# Patient Record
Sex: Female | Born: 1960
Health system: Southern US, Community
[De-identification: ages and names within clinical notes are randomized; demographics above are authoritative.]

## PROBLEM LIST (undated history)

## (undated) DIAGNOSIS — C801 Malignant (primary) neoplasm, unspecified: Secondary | ICD-10-CM

## (undated) DIAGNOSIS — F32A Depression, unspecified: Secondary | ICD-10-CM

## (undated) DIAGNOSIS — K589 Irritable bowel syndrome without diarrhea: Secondary | ICD-10-CM

## (undated) DIAGNOSIS — F419 Anxiety disorder, unspecified: Secondary | ICD-10-CM

## (undated) DIAGNOSIS — T7840XA Allergy, unspecified, initial encounter: Secondary | ICD-10-CM

## (undated) DIAGNOSIS — M1990 Unspecified osteoarthritis, unspecified site: Secondary | ICD-10-CM

## (undated) DIAGNOSIS — M6530 Trigger finger, unspecified finger: Secondary | ICD-10-CM

## (undated) HISTORY — PX: CHOLECYSTECTOMY: SHX55

## (undated) HISTORY — DX: Trigger finger, unspecified finger: M65.30

## (undated) HISTORY — DX: Depression, unspecified: F32.A

## (undated) HISTORY — DX: Malignant (primary) neoplasm, unspecified: C80.1

## (undated) HISTORY — DX: Allergy, unspecified, initial encounter: T78.40XA

## (undated) HISTORY — PX: TYMPANOPLASTY: SHX33

## (undated) HISTORY — DX: Anxiety disorder, unspecified: F41.9

## (undated) HISTORY — DX: Irritable bowel syndrome, unspecified: K58.9

## (undated) HISTORY — PX: ABDOMINAL HYSTERECTOMY: SHX81

---

## 1996-03-28 HISTORY — PX: BREAST ENHANCEMENT SURGERY: SHX7

## 2008-03-28 HISTORY — PX: BREAST CYST ASPIRATION: SHX578

## 2008-03-28 HISTORY — PX: AUGMENTATION MAMMAPLASTY: SUR837

## 2016-02-26 HISTORY — PX: ABDOMINAL HYSTERECTOMY: SUR658

## 2017-02-21 ENCOUNTER — Ambulatory Visit (INDEPENDENT_AMBULATORY_CARE_PROVIDER_SITE_OTHER): Payer: BLUE CROSS/BLUE SHIELD | Admitting: Internal Medicine

## 2017-02-21 ENCOUNTER — Encounter: Payer: Self-pay | Admitting: Internal Medicine

## 2017-02-21 VITALS — BP 124/70 | HR 70 | Temp 98.0°F | Ht 63.5 in | Wt 129.0 lb

## 2017-02-21 DIAGNOSIS — K582 Mixed irritable bowel syndrome: Secondary | ICD-10-CM | POA: Insufficient documentation

## 2017-02-21 DIAGNOSIS — B349 Viral infection, unspecified: Secondary | ICD-10-CM

## 2017-02-21 DIAGNOSIS — M65332 Trigger finger, left middle finger: Secondary | ICD-10-CM | POA: Diagnosis not present

## 2017-02-21 DIAGNOSIS — Z1231 Encounter for screening mammogram for malignant neoplasm of breast: Secondary | ICD-10-CM

## 2017-02-21 DIAGNOSIS — Z1239 Encounter for other screening for malignant neoplasm of breast: Secondary | ICD-10-CM

## 2017-02-21 NOTE — Progress Notes (Signed)
Date:  02/21/2017   Name:  Jennifer Garrison   DOB:  [birth year]   MRN:  [id number]   Chief Complaint: Establish Care and Fatigue (Past two days. Went to [employer] week. Sunday night started feeling foggy and tired. Very cold with chills throughout the last few days. Yesterday laid around and did nothing. )  Illness  The current episode started yesterday. The problem occurs constantly. The problem is unchanged. The pain is moderate. The symptoms are aggravated by activity. Associated symptoms include fatigue. Pertinent negatives include no congestion, headaches, hearing loss, fever (but chills), chest pain, shortness of breath, wheezing, constipation, diarrhea, nausea, joint swelling or rash.    Review of Systems  Constitutional: Positive for chills and fatigue. Negative for fever (but chills).  HENT: Negative for congestion, facial swelling, hearing loss, postnasal drip, sinus pressure, sinus pain, trouble swallowing and voice change.   Respiratory: Negative for shortness of breath and wheezing.   Cardiovascular: Negative for chest pain, palpitations and leg swelling.  Gastrointestinal: Negative for abdominal distention, constipation, diarrhea and nausea.  Genitourinary: Negative for dysuria.  Musculoskeletal: Negative for arthralgias and joint swelling.       Triggering of left middle finger  Skin: Negative for rash.  Allergic/Immunologic: Negative for environmental allergies.  Neurological: Negative for dizziness, weakness and headaches.  Hematological: Negative for adenopathy.  Psychiatric/Behavioral: Negative for sleep disturbance.    There are no active problems to display for this patient.   Prior to Admission medications   Not on File    No Known Allergies  Past Surgical History:  Procedure Laterality Date  . ABDOMINAL HYSTERECTOMY  02/2016   Partial- Still has cervix.   Marland Kitchen BREAST ENHANCEMENT SURGERY  1998  . CHOLECYSTECTOMY    . TYMPANOPLASTY Left      Social History   Tobacco Use  . Smoking status: Former Research scientist (life sciences)  . Smokeless tobacco: Never Used  Substance Use Topics  . Alcohol use: Yes    Comment: socially   . Drug use: No     Medication list has been reviewed and updated.  No flowsheet data found.  Physical Exam  Constitutional: She is oriented to person, place, and time. She appears well-developed. No distress.  HENT:  Head: Normocephalic and atraumatic.  Right Ear: Ear canal normal. Tympanic membrane is scarred.  Mouth/Throat: No posterior oropharyngeal edema or posterior oropharyngeal erythema.  Left canal obscured by cerumen  Cardiovascular: Normal rate, regular rhythm and normal heart sounds.  Pulmonary/Chest: Effort normal and breath sounds normal. No respiratory distress. She has no wheezes. She has no rhonchi.  Abdominal: Soft. Normal appearance and bowel sounds are normal. There is no tenderness.  Musculoskeletal: Normal range of motion.  Left middle finger triggering  Neurological: She is alert and oriented to person, place, and time.  Skin: Skin is warm and dry. No rash noted.  Psychiatric: She has a normal mood and affect. Her behavior is normal. Thought content normal.  Nursing note and vitals reviewed.   BP 124/70   Pulse 70   Temp 98 F (36.7 C) (Oral)   Ht 5' 3.5" (1.613 m)   Wt 129 lb (58.5 kg)   SpO2 99%   BMI 22.49 kg/m   Assessment and Plan: 1. Irritable bowel syndrome with both constipation and diarrhea On bupropion with good control  2. Viral disease Recommend rest, fluids Follow up if sx worsen  3. Breast cancer screening Due for annual mammogram - will need to get records from [place] -  MM DIGITAL SCREENING BILATERAL; Future  4. Trigger middle finger of left hand Avoid stress Consider Ortho referral   No orders of the defined types were placed in this encounter.   Partially dictated using Editor, commissioning. Any errors are unintentional.  Halina Maidens, MD [facility]  02/21/2017

## 2017-04-13 ENCOUNTER — Telehealth: Payer: Self-pay

## 2017-04-13 NOTE — Telephone Encounter (Signed)
Had Rx from Bupropion with 7 refills is now not able to be filled since old doctor out of state license was cancelled. Per Dr. Army Melia.Marland KitchenMarland KitchenMarland KitchenMust be seen in OV.Scgeduled for tomorrow.

## 2017-04-14 ENCOUNTER — Ambulatory Visit (INDEPENDENT_AMBULATORY_CARE_PROVIDER_SITE_OTHER): Payer: BLUE CROSS/BLUE SHIELD | Admitting: Internal Medicine

## 2017-04-14 ENCOUNTER — Encounter: Payer: Self-pay | Admitting: Internal Medicine

## 2017-04-14 VITALS — BP 118/60 | HR 73 | Ht 63.5 in | Wt 133.0 lb

## 2017-04-14 DIAGNOSIS — F39 Unspecified mood [affective] disorder: Secondary | ICD-10-CM

## 2017-04-14 MED ORDER — BUPROPION HCL ER (SR) 150 MG PO TB12: 150.0000 mg | ORAL_TABLET | Freq: Two times a day (BID) | ORAL | 5 refills | Status: DC

## 2017-04-14 NOTE — Progress Notes (Signed)
Date:  04/14/2017   Name:  Jennifer Garrison   DOB:  [birth year]   MRN:  [id number]   Chief Complaint: Depression (Refill on buproprion. PHQ9- 4. )  Depression         This is a chronic problem.  The problem occurs rarely.The problem is unchanged.  Associated symptoms include irritable and decreased interest.  Associated symptoms include no fatigue and no headaches.  Treatments tried: bupropion.  Compliance with treatment is good.  Previous treatment provided significant relief.    Review of Systems  Constitutional: Negative for chills, fatigue and fever.  Respiratory: Negative for chest tightness, shortness of breath and wheezing.   Cardiovascular: Negative for chest pain and palpitations.  Neurological: Negative for dizziness and headaches.  Psychiatric/Behavioral: Positive for depression.    Patient Active Problem List   Diagnosis Date Noted  . Irritable bowel syndrome with both constipation and diarrhea 02/21/2017  . Trigger middle finger of left hand 02/21/2017    Prior to Admission medications   Medication Sig Start Date End Date Taking? Authorizing Provider  buPROPion (WELLBUTRIN SR) 150 MG 12 hr tablet Take 150 mg by mouth 2 (two) times daily.   Yes [provider]    No Known Allergies  Past Surgical History:  Procedure Laterality Date  . ABDOMINAL HYSTERECTOMY  02/2016   Only cervix remains (done for prolapse)  . BREAST BIOPSY Left 2010   benign  . BREAST ENHANCEMENT SURGERY  1998  . CHOLECYSTECTOMY    . TYMPANOPLASTY Left     Social History   Tobacco Use  . Smoking status: Former Smoker    Types: Cigarettes    Last attempt to quit: 09/09/1992    Years since quitting: 24.6  . Smokeless tobacco: Never Used  Substance Use Topics  . Alcohol use: Yes    Comment: socially   . Drug use: No     Medication list has been reviewed and updated.  PHQ 2/9 Scores 04/14/2017 04/14/2017  PHQ - 2 Score 2 0  PHQ- 9 Score 4 -    Physical Exam    Constitutional: She is oriented to person, place, and time. She appears well-developed. She is irritable. No distress.  HENT:  Head: Normocephalic and atraumatic.  Neck: Normal range of motion. Neck supple.  Cardiovascular: Normal rate, regular rhythm and normal heart sounds.  Pulmonary/Chest: Effort normal and breath sounds normal. No respiratory distress. She has no wheezes.  Musculoskeletal: She exhibits no edema.  Neurological: She is alert and oriented to person, place, and time.  Skin: Skin is warm and dry. No rash noted.  Psychiatric: She has a normal mood and affect. Her behavior is normal. Thought content normal.  Nursing note and vitals reviewed.   BP 118/60   Pulse 73   Ht 5' 3.5" (1.613 m)   Wt 133 lb (60.3 kg)   SpO2 100%   BMI 23.19 kg/m   Assessment and Plan: 1. Mood disorder (HCC) Continue current medications - buPROPion (WELLBUTRIN SR) 150 MG 12 hr tablet; Take 1 tablet (150 mg total) by mouth 2 (two) times daily.  Dispense: 60 tablet; Refill: 5   Meds ordered this encounter  Medications  . buPROPion (WELLBUTRIN SR) 150 MG 12 hr tablet    Sig: Take 1 tablet (150 mg total) by mouth 2 (two) times daily.    Dispense:  60 tablet    Refill:  5    Partially dictated using Editor, commissioning. Any errors are unintentional.  Halina Maidens,  MD [facility]  04/14/2017

## 2017-04-24 ENCOUNTER — Other Ambulatory Visit: Payer: Self-pay | Admitting: Internal Medicine

## 2017-04-24 ENCOUNTER — Ambulatory Visit
Admission: RE | Admit: 2017-04-24 | Discharge: 2017-04-24 | Disposition: A | Discharge status: Home / Self Care | Payer: BLUE CROSS/BLUE SHIELD | Source: Ambulatory Visit | Attending: Internal Medicine | Admitting: Internal Medicine

## 2017-04-24 DIAGNOSIS — Z1239 Encounter for other screening for malignant neoplasm of breast: Secondary | ICD-10-CM

## 2017-04-24 DIAGNOSIS — Z1231 Encounter for screening mammogram for malignant neoplasm of breast: Secondary | ICD-10-CM | POA: Insufficient documentation

## 2017-05-01 ENCOUNTER — Ambulatory Visit (INDEPENDENT_AMBULATORY_CARE_PROVIDER_SITE_OTHER): Payer: BLUE CROSS/BLUE SHIELD | Admitting: Internal Medicine

## 2017-05-01 ENCOUNTER — Other Ambulatory Visit: Payer: Self-pay | Admitting: *Deleted

## 2017-05-01 ENCOUNTER — Encounter: Payer: Self-pay | Admitting: Internal Medicine

## 2017-05-01 ENCOUNTER — Inpatient Hospital Stay
Admission: RE | Admit: 2017-05-01 | Discharge: 2017-05-01 | Disposition: A | Discharge status: Home / Self Care | Payer: Self-pay | Source: Ambulatory Visit | Attending: *Deleted | Admitting: *Deleted

## 2017-05-01 VITALS — BP 110/64 | HR 64 | Ht 63.5 in | Wt 134.0 lb

## 2017-05-01 DIAGNOSIS — F32A Depression, unspecified: Secondary | ICD-10-CM | POA: Insufficient documentation

## 2017-05-01 DIAGNOSIS — Z9289 Personal history of other medical treatment: Secondary | ICD-10-CM

## 2017-05-01 DIAGNOSIS — Z1211 Encounter for screening for malignant neoplasm of colon: Secondary | ICD-10-CM

## 2017-05-01 DIAGNOSIS — Z1159 Encounter for screening for other viral diseases: Secondary | ICD-10-CM

## 2017-05-01 DIAGNOSIS — Z0000 Encounter for general adult medical examination without abnormal findings: Secondary | ICD-10-CM

## 2017-05-01 DIAGNOSIS — F324 Major depressive disorder, single episode, in partial remission: Secondary | ICD-10-CM

## 2017-05-01 DIAGNOSIS — K582 Mixed irritable bowel syndrome: Secondary | ICD-10-CM

## 2017-05-01 DIAGNOSIS — F419 Anxiety disorder, unspecified: Secondary | ICD-10-CM | POA: Insufficient documentation

## 2017-05-01 NOTE — Progress Notes (Signed)
Date:  05/01/2017   Name:  Jennifer Garrison   DOB:  28-Aug-1960   MRN:  [id number]   Chief Complaint: Annual Exam (Breast Exam. ) Jennifer Garrison is a 57 y.o. female who presents today for her Complete Annual Exam. She feels well. She reports exercising some. She reports she is sleeping well. She had a mammogram last week - results pending. She has had a colonoscopy - many years ago - reported normal.  Not sure if it has been 10 yrs. Last Pap in 04/2016 after partial hysterectomy.   Review of Systems  Constitutional: Negative for chills, fatigue and fever.  HENT: Negative for congestion, hearing loss, tinnitus, trouble swallowing and voice change.   Eyes: Negative for visual disturbance.  Respiratory: Negative for cough, chest tightness, shortness of breath and wheezing.   Cardiovascular: Negative for chest pain, palpitations and leg swelling.  Gastrointestinal: Negative for abdominal pain, constipation, diarrhea and vomiting.  Endocrine: Negative for polydipsia and polyuria.  Genitourinary: Negative for dysuria, frequency, genital sores, vaginal bleeding and vaginal discharge.  Musculoskeletal: Negative for arthralgias, gait problem and joint swelling.  Skin: Negative for color change and rash.  Neurological: Negative for dizziness, tremors, light-headedness and headaches.  Hematological: Negative for adenopathy. Does not bruise/bleed easily.  Psychiatric/Behavioral: Negative for dysphoric mood and sleep disturbance. The patient is not nervous/anxious.     Patient Active Problem List   Diagnosis Date Noted  . Irritable bowel syndrome with both constipation and diarrhea 02/21/2017  . Trigger middle finger of left hand 02/21/2017    Prior to Admission medications   Medication Sig Start Date End Date Taking? Authorizing Provider  buPROPion (WELLBUTRIN SR) 150 MG 12 hr tablet Take 1 tablet (150 mg total) by mouth 2 (two) times daily. 04/14/17  Yes Glean Hess, MD    No Known  Allergies  Past Surgical History:  Procedure Laterality Date  . ABDOMINAL HYSTERECTOMY  02/2016   Only cervix remains (done for prolapse)  . ABDOMINAL HYSTERECTOMY    . AUGMENTATION MAMMAPLASTY Bilateral 2010  . BREAST CYST ASPIRATION Left 2010   neg  . BREAST ENHANCEMENT SURGERY  1998  . CHOLECYSTECTOMY    . TYMPANOPLASTY Left     Social History   Tobacco Use  . Smoking status: Former Smoker    Types: Cigarettes    Last attempt to quit: 09/09/1992    Years since quitting: 24.6  . Smokeless tobacco: Never Used  Substance Use Topics  . Alcohol use: Yes    Comment: socially   . Drug use: No     Medication list has been reviewed and updated.  PHQ 2/9 Scores 04/14/2017 04/14/2017  PHQ - 2 Score 2 0  PHQ- 9 Score 4 -    Physical Exam  Constitutional: She is oriented to person, place, and time. She appears well-developed and well-nourished. No distress.  HENT:  Head: Normocephalic and atraumatic.  Right Ear: Tympanic membrane and ear canal normal.  Left Ear: Tympanic membrane and ear canal normal.  Nose: Right sinus exhibits no maxillary sinus tenderness. Left sinus exhibits no maxillary sinus tenderness.  Mouth/Throat: Uvula is midline and oropharynx is clear and moist.  Eyes: Conjunctivae and EOM are normal. Right eye exhibits no discharge. Left eye exhibits no discharge. No scleral icterus.  Neck: Normal range of motion. Carotid bruit is not present. No erythema present. No thyromegaly present.  Cardiovascular: Normal rate, regular rhythm, normal heart sounds and normal pulses.  Pulmonary/Chest: Effort normal. No respiratory distress. She  has no wheezes. Right breast exhibits no mass, no nipple discharge, no skin change and no tenderness. Left breast exhibits no mass, no nipple discharge, no skin change and no tenderness.  Bilateral implants soft, shifted sideways.  Abdominal: Soft. Bowel sounds are normal. There is no hepatosplenomegaly. There is no tenderness. There is  no CVA tenderness.  Musculoskeletal: Normal range of motion.  Lymphadenopathy:    She has no cervical adenopathy.    She has no axillary adenopathy.  Neurological: She is alert and oriented to person, place, and time. She has normal reflexes. No cranial nerve deficit or sensory deficit.  Skin: Skin is warm, dry and intact. No rash noted.  Psychiatric: She has a normal mood and affect. Her speech is normal and behavior is normal. Thought content normal.  Nursing note and vitals reviewed.   BP 110/64   Pulse 64   Ht 5' 3.5" (1.613 m)   Wt 134 lb (60.8 kg)   SpO2 99%   BMI 23.36 kg/m   Assessment and Plan: 1. Annual physical exam Normal exam  Waiting for mammogram results - Comprehensive metabolic panel - Lipid panel - POCT urinalysis dipstick  2. Major depressive disorder with single episode, in partial remission ([name]) Doing well on medication - TSH  3. Irritable bowel syndrome with both constipation and diarrhea Stable since cholecystectomy - CBC with Differential/Platelet  4. Need for hepatitis C screening test - Hepatitis C antibody  5. Colon cancer screening - Ambulatory referral to Gastroenterology   No orders of the defined types were placed in this encounter.   Partially dictated using Editor, commissioning. Any errors are unintentional.  Halina Maidens, MD [facility]  05/01/2017

## 2017-05-02 LAB — COMPREHENSIVE METABOLIC PANEL
A/G RATIO: 2 (ref 1.2–2.2)
ALBUMIN: 4.9 g/dL (ref 3.5–5.5)
ALK PHOS: 84 IU/L (ref 39–117)
ALT: 15 IU/L (ref 0–32)
AST: 17 IU/L (ref 0–40)
BILIRUBIN TOTAL: 0.6 mg/dL (ref 0.0–1.2)
BUN / CREAT RATIO: 16 (ref 9–23)
BUN: 13 mg/dL (ref 6–24)
CHLORIDE: 101 mmol/L (ref 96–106)
CO2: 26 mmol/L (ref 20–29)
Calcium: 9.5 mg/dL (ref 8.7–10.2)
Creatinine, Ser: 0.8 mg/dL (ref 0.57–1.00)
GFR calc non Af Amer: 82 mL/min/{1.73_m2} (ref >59)
GFR, EST AFRICAN AMERICAN: 95 mL/min/{1.73_m2} (ref >59)
GLOBULIN, TOTAL: 2.5 g/dL (ref 1.5–4.5)
GLUCOSE: 80 mg/dL (ref 65–99)
Potassium: 4.3 mmol/L (ref 3.5–5.2)
SODIUM: 143 mmol/L (ref 134–144)
TOTAL PROTEIN: 7.4 g/dL (ref 6.0–8.5)

## 2017-05-02 LAB — CBC WITH DIFFERENTIAL/PLATELET
BASOS ABS: 0 10*3/uL (ref 0.0–0.2)
BASOS: 1 %
EOS (ABSOLUTE): 0.1 10*3/uL (ref 0.0–0.4)
Eos: 2 %
HEMATOCRIT: 42.7 % (ref 34.0–46.6)
HEMOGLOBIN: 14.4 g/dL (ref 11.1–15.9)
IMMATURE GRANS (ABS): 0 10*3/uL (ref 0.0–0.1)
Immature Granulocytes: 0 %
LYMPHS: 25 %
Lymphocytes Absolute: 2 10*3/uL (ref 0.7–3.1)
MCH: 29 pg (ref 26.6–33.0)
MCHC: 33.7 g/dL (ref 31.5–35.7)
MCV: 86 fL (ref 79–97)
MONOCYTES: 5 %
Monocytes Absolute: 0.4 10*3/uL (ref 0.1–0.9)
NEUTROS ABS: 5.3 10*3/uL (ref 1.4–7.0)
Neutrophils: 67 %
Platelets: 244 10*3/uL (ref 150–379)
RBC: 4.96 x10E6/uL (ref 3.77–5.28)
RDW: 13.5 % (ref 12.3–15.4)
WBC: 7.8 10*3/uL (ref 3.4–10.8)

## 2017-05-02 LAB — LIPID PANEL
CHOLESTEROL TOTAL: 187 mg/dL (ref 100–199)
Chol/HDL Ratio: 3.5 ratio (ref 0.0–4.4)
HDL: 53 mg/dL (ref >39)
LDL Calculated: 112 mg/dL — ABNORMAL HIGH (ref 0–99)
Triglycerides: 112 mg/dL (ref 0–149)
VLDL CHOLESTEROL CAL: 22 mg/dL (ref 5–40)

## 2017-05-02 LAB — TSH: TSH: 1.99 u[IU]/mL (ref 0.450–4.500)

## 2017-05-02 LAB — HEPATITIS C ANTIBODY

## 2017-05-03 ENCOUNTER — Telehealth: Payer: Self-pay | Admitting: Gastroenterology

## 2017-05-03 ENCOUNTER — Other Ambulatory Visit: Payer: Self-pay

## 2017-05-03 DIAGNOSIS — Z1211 Encounter for screening for malignant neoplasm of colon: Secondary | ICD-10-CM

## 2017-05-03 NOTE — Telephone Encounter (Signed)
Patient LVM that she would like to schedule a colonoscopy

## 2017-05-05 ENCOUNTER — Other Ambulatory Visit: Payer: Self-pay

## 2017-05-17 ENCOUNTER — Ambulatory Visit: Admit: 2017-05-17 | Payer: BLUE CROSS/BLUE SHIELD | Admitting: Gastroenterology

## 2017-05-17 SURGERY — COLONOSCOPY WITH PROPOFOL
Anesthesia: General

## 2017-07-26 ENCOUNTER — Ambulatory Visit: Payer: BLUE CROSS/BLUE SHIELD | Admitting: Internal Medicine

## 2017-10-30 ENCOUNTER — Telehealth: Payer: Self-pay

## 2017-10-30 NOTE — Telephone Encounter (Signed)
Patient called stating she has had dizziness for 3 days now. Some nausea this morning. No SOB or chest pain. She stated she feels dizziness when standing up too quickly or waking up in the morning and getting out of bed.  Informed her Dr Army Melia is out of town this week for vacation so she wouldn't be able to be seen until Monday. Informed her not to wait until Monday and to go see UC to be evaluated. She verbalized understanding of this.

## 2017-10-31 ENCOUNTER — Encounter: Payer: Self-pay | Admitting: Emergency Medicine

## 2017-10-31 ENCOUNTER — Other Ambulatory Visit: Payer: Self-pay

## 2017-10-31 ENCOUNTER — Ambulatory Visit
Admission: EM | Admit: 2017-10-31 | Discharge: 2017-10-31 | Disposition: A | Discharge status: Home / Self Care | Payer: BLUE CROSS/BLUE SHIELD | Attending: Family Medicine | Admitting: Family Medicine

## 2017-10-31 DIAGNOSIS — R42 Dizziness and giddiness: Secondary | ICD-10-CM

## 2017-10-31 DIAGNOSIS — E860 Dehydration: Secondary | ICD-10-CM

## 2017-10-31 NOTE — ED Provider Notes (Signed)
[facility]    CSN: [id number] Arrival date & time: 10/31/17  1316     History   Chief Complaint Chief Complaint  Patient presents with  . Dizziness    HPI Ellieana Dolecki is a 57 y.o. female.   HPI  57 year Old female presents with a history of "dizziness" describes more as an imbalance she gets up from a lying position to a sitting or standing position.  States that the episode lasts 5 to 10 seconds at the most.  He has noticed that it happens only when she goes from a lying to a sitting or standing position.  Night time seems to be the worst- she is afraid that she may fall because of the unsteadiness.  She states that she feels like she is in a "fog"during the daytime.  Had no near syncope or syncopal episodes.  Does not feel the room or herself spinning.  Has no nausea or vomiting.  States that she was drinking apple cider at an event the day prior to the onset of her symptoms.  States she did not drink enough to be drunk and drank plenty of water in between cider.  She thinks that the symptoms have gotten worse today  When she got up this morning.  She does not have episodes during the day.  15 years ago she had problem with disruption of her ear crystals underwent realignment and that improved and has not returned until now.  The symptoms are somewhat different.         Past Medical History:  Diagnosis Date  . Anxiety   . IBS (irritable bowel syndrome)   . Trigger finger    Left Middle Finger 11/2016    Patient Active Problem List   Diagnosis Date Noted  . Major depressive disorder with single episode, in partial remission ([place]) 05/01/2017  . Irritable bowel syndrome with both constipation and diarrhea 02/21/2017  . Trigger middle finger of left hand 02/21/2017    Past Surgical History:  Procedure Laterality Date  . ABDOMINAL HYSTERECTOMY  02/2016   Only cervix remains (done for prolapse)  . AUGMENTATION MAMMAPLASTY Bilateral 2010  . BREAST CYST  ASPIRATION Left 2010   neg  . BREAST ENHANCEMENT SURGERY  1998  . CHOLECYSTECTOMY    . TYMPANOPLASTY Left     OB History   None      Home Medications    Prior to Admission medications   Medication Sig Start Date End Date Taking? Authorizing Provider  buPROPion (WELLBUTRIN SR) 150 MG 12 hr tablet Take 1 tablet (150 mg total) by mouth 2 (two) times daily. 04/14/17  Yes Glean Hess, MD    Family History Family History  Problem Relation Age of Onset  . Lung cancer Mother   . Hypertension Mother   . Lupus Father   . Breast cancer Neg Hx     Social History Social History   Tobacco Use  . Smoking status: Former Smoker    Types: Cigarettes    Last attempt to quit: 09/09/1992    Years since quitting: 25.1  . Smokeless tobacco: Never Used  Substance Use Topics  . Alcohol use: Yes    Comment: socially   . Drug use: No     Allergies   Patient has no known allergies.   Review of Systems Review of Systems  Constitutional: Positive for activity change. Negative for appetite change, chills, fatigue and fever.  HENT: Negative for ear discharge and ear  pain.   Neurological: Positive for dizziness.  All other systems reviewed and are negative.    Physical Exam Triage Vital Signs ED Triage Vitals [10/31/17 1326]  Enc Vitals Group     BP 117/70     Pulse Rate 76     Resp 16     Temp 98.1 F (36.7 C)     Temp Source Oral     SpO2 98 %     Weight 125 lb (56.7 kg)     Height 5\' 4"  (1.626 m)     Head Circumference      Peak Flow      Pain Score 0     Pain Loc      Pain Edu?      Excl. in [place]?    Orthostatic VS for the past 24 hrs:  BP- Lying Pulse- Lying BP- Sitting Pulse- Sitting BP- Standing at 0 minutes Pulse- Standing at 0 minutes  10/31/17 1329 108/63 65 102/71 75 97/71 92    Updated Vital Signs BP 117/70 (BP Location: Left Arm)   Pulse 76   Temp 98.1 F (36.7 C) (Oral)   Resp 16   Ht 5\' 4"  (1.626 m)   Wt 125 lb (56.7 kg)   SpO2 98%   BMI  21.46 kg/m   Visual Acuity Right Eye Distance:   Left Eye Distance:   Bilateral Distance:    Right Eye Near:   Left Eye Near:    Bilateral Near:     Physical Exam  Constitutional: She is oriented to person, place, and time. She appears well-developed and well-nourished. No distress.  HENT:  Head: Normocephalic.  Left Ear: External ear normal.  Nose: Nose normal.  Mouth/Throat: Oropharynx is clear and moist.  Right TM is sclerotic from previous tympanoplasty  Eyes: Pupils are equal, round, and reactive to light. Right eye exhibits no discharge. Left eye exhibits no discharge.  Neck: Normal range of motion. Neck supple.  Musculoskeletal: Normal range of motion.  Lymphadenopathy:    She has no cervical adenopathy.  Neurological: She is alert and oriented to person, place, and time. She displays normal reflexes. No cranial nerve deficit or sensory deficit. She exhibits normal muscle tone. Coordination normal.  Skin: Skin is warm and dry. She is not diaphoretic.  Psychiatric: She has a normal mood and affect. Her behavior is normal. Judgment and thought content normal.  Nursing note and vitals reviewed.    UC Treatments / Results  Labs (all labs ordered are listed, but only abnormal results are displayed) Labs Reviewed - No data to display  EKG None  Radiology No results found.  Procedures Procedures (including critical care time)  Medications Ordered in UC Medications - No data to display  Initial Impression / Assessment and Plan / UC Course  I have reviewed the triage vital signs and the nursing notes.  Pertinent labs & imaging results that were available during my care of the patient were reviewed by me and considered in my medical decision making (see chart for details).     Plan: 1. Test/x-ray results and diagnosis reviewed with patient 2. rx as per orders; risks, benefits, potential side effects reviewed with patient 3. Recommend supportive treatment with  continuing to force fluids.  To attempt to keep her urine clear as possible.  Only careful at nighttime when getting up move around for a minute or 2 before trying to stand and walk.  The orthostatics are normal.  However I believe that  she may have an element of dehydration and being behind in fluids.If  Not improved in a day or 2 she should follow-up with ENT for further evaluation and she was provided with the phone number to [facility]. 4. F/u prn if symptoms worsen or don't improve  Final Clinical Impressions(s) / UC Diagnoses   Final diagnoses:  Dizziness  Dehydration     Discharge Instructions     Force fluids.  Be careful at nighttime when getting up to use the bathroom.  Sit on the side the bed and move your feet for about 1 minute prior to standing.  Follow-up with ear nose and throat specialist if not improving    ED Prescriptions    None     Controlled Substance Prescriptions [place] Controlled Substance Registry consulted? Not ApplicableToday   Lorin Picket, PA-C 10/31/17 [id number]

## 2017-10-31 NOTE — Discharge Instructions (Signed)
Force fluids.  Be careful at nighttime when getting up to use the bathroom.  Sit on the side the bed and move your feet for about 1 minute prior to standing.  Follow-up with ear nose and throat specialist if not improving

## 2017-10-31 NOTE — ED Triage Notes (Signed)
Patient in today c/o dizziness when she gets up from bed and from sitting x 3 days. Patient has a history of benign positional vertigo and low blood pressure.

## 2018-01-05 ENCOUNTER — Other Ambulatory Visit: Payer: Self-pay

## 2018-01-05 ENCOUNTER — Telehealth: Payer: Self-pay | Admitting: Internal Medicine

## 2018-01-05 DIAGNOSIS — F39 Unspecified mood [affective] disorder: Secondary | ICD-10-CM

## 2018-01-05 NOTE — Telephone Encounter (Signed)
Patient should of ran out of this medication in June of this year. She may not be taking it as directed. She needs to come in and discuss medication refill with Dr Army Melia in the next week. Please call and schedule appt. Thank you.

## 2018-01-05 NOTE — Telephone Encounter (Signed)
Pt needs refill sent to [facility]  buPROPion [facility] SR) 150 MG 12 hr tablet [[id number]]

## 2018-01-09 ENCOUNTER — Ambulatory Visit (INDEPENDENT_AMBULATORY_CARE_PROVIDER_SITE_OTHER): Payer: BLUE CROSS/BLUE SHIELD | Admitting: Internal Medicine

## 2018-01-09 ENCOUNTER — Encounter: Payer: Self-pay | Admitting: Internal Medicine

## 2018-01-09 VITALS — BP 112/78 | HR 62 | Ht 63.5 in | Wt 126.0 lb

## 2018-01-09 DIAGNOSIS — Z23 Encounter for immunization: Secondary | ICD-10-CM | POA: Diagnosis not present

## 2018-01-09 DIAGNOSIS — F324 Major depressive disorder, single episode, in partial remission: Secondary | ICD-10-CM | POA: Diagnosis not present

## 2018-01-09 DIAGNOSIS — Z1231 Encounter for screening mammogram for malignant neoplasm of breast: Secondary | ICD-10-CM | POA: Diagnosis not present

## 2018-01-09 DIAGNOSIS — F39 Unspecified mood [affective] disorder: Secondary | ICD-10-CM

## 2018-01-09 MED ORDER — BUPROPION HCL ER (SR) 150 MG PO TB12: 150.0000 mg | ORAL_TABLET | Freq: Two times a day (BID) | ORAL | 5 refills | Status: DC

## 2018-01-09 NOTE — Progress Notes (Signed)
Date:  01/09/2018   Name:  Jennifer Garrison   DOB:  1961/01/11   MRN:  [id number]   Chief Complaint: Depression  Depression         This is a chronic problem.The problem is unchanged.  Associated symptoms include no fatigue, no headaches and no suicidal ideas.  Past treatments include other medications (bupropion).  Compliance with treatment is good.  Previous treatment provided significant relief.  CRC screening - referred and scheduled in February but procedure was cancelled due to family visiting.  She needs mammogram in February.  No breast problems. She does need a biometric form completed for her husband's insurance.  I can complete that when she drops it off.  Review of Systems  Constitutional: Negative for chills, fatigue, fever and unexpected weight change.  Respiratory: Negative for cough, chest tightness, shortness of breath and wheezing.   Cardiovascular: Negative for chest pain and palpitations.  Gastrointestinal: Negative for abdominal pain, constipation and diarrhea.  Neurological: Negative for dizziness and headaches.  Psychiatric/Behavioral: Positive for depression. Negative for dysphoric mood, sleep disturbance and suicidal ideas. The patient is not nervous/anxious.     Patient Active Problem List   Diagnosis Date Noted  . Major depressive disorder with single episode, in partial remission ([place]) 05/01/2017  . Irritable bowel syndrome with both constipation and diarrhea 02/21/2017  . Trigger middle finger of left hand 02/21/2017    No Known Allergies  Past Surgical History:  Procedure Laterality Date  . ABDOMINAL HYSTERECTOMY  02/2016   Only cervix remains (done for prolapse)  . AUGMENTATION MAMMAPLASTY Bilateral 2010  . BREAST CYST ASPIRATION Left 2010   neg  . BREAST ENHANCEMENT SURGERY  1998  . CHOLECYSTECTOMY    . TYMPANOPLASTY Left     Social History   Tobacco Use  . Smoking status: Former Smoker    Types: Cigarettes    Last attempt to quit:  09/09/1992    Years since quitting: 25.3  . Smokeless tobacco: Never Used  Substance Use Topics  . Alcohol use: Yes    Comment: socially   . Drug use: No     Medication list has been reviewed and updated.  Current Meds  Medication Sig  . buPROPion (WELLBUTRIN SR) 150 MG 12 hr tablet Take 1 tablet (150 mg total) by mouth 2 (two) times daily.  . [DISCONTINUED] buPROPion (WELLBUTRIN SR) 150 MG 12 hr tablet Take 1 tablet (150 mg total) by mouth 2 (two) times daily.    PHQ 2/9 Scores 01/09/2018 04/14/2017 04/14/2017  PHQ - 2 Score 0 2 0  PHQ- 9 Score 0 4 -    Physical Exam  Constitutional: She is oriented to person, place, and time. She appears well-developed. No distress.  HENT:  Head: Normocephalic and atraumatic.  Neck: Normal range of motion.  Cardiovascular: Normal rate, regular rhythm and normal heart sounds.  Pulmonary/Chest: Effort normal and breath sounds normal. No respiratory distress.  Musculoskeletal: Normal range of motion.  Neurological: She is alert and oriented to person, place, and time.  Skin: Skin is warm and dry. No rash noted.  Psychiatric: She has a normal mood and affect. Her behavior is normal. Thought content normal.  Nursing note and vitals reviewed.  Waist circumference 30 inches BP 112/78 (BP Location: Right Arm, Patient Position: Sitting, Cuff Size: Normal)   Pulse 62   Ht 5' 3.5" (1.613 m)   Wt 126 lb (57.2 kg)   SpO2 100%   BMI 21.97 kg/m   Assessment  and Plan: 1. Major depressive disorder with single episode, in partial remission ([place]) Doing well on current therapy - buPROPion (WELLBUTRIN SR) 150 MG 12 hr tablet; Take 1 tablet (150 mg total) by mouth 2 (two) times daily.  Dispense: 60 tablet; Refill: 5  2. Encounter for screening mammogram for breast cancer ordered - MM 3D SCREEN BREAST BILATERAL; Future  Pt will also call to reschedule her colonoscopy.  Partially dictated using Editor, commissioning. Any errors are unintentional.  Halina Maidens, MD [facility]  01/09/2018

## 2018-01-30 ENCOUNTER — Ambulatory Visit (INDEPENDENT_AMBULATORY_CARE_PROVIDER_SITE_OTHER): Payer: BLUE CROSS/BLUE SHIELD | Admitting: Internal Medicine

## 2018-01-30 ENCOUNTER — Encounter: Payer: Self-pay | Admitting: Internal Medicine

## 2018-01-30 ENCOUNTER — Ambulatory Visit
Admission: RE | Admit: 2018-01-30 | Discharge: 2018-01-30 | Disposition: A | Discharge status: Home / Self Care | Payer: BLUE CROSS/BLUE SHIELD | Source: Ambulatory Visit | Attending: Internal Medicine | Admitting: Internal Medicine

## 2018-01-30 VITALS — BP 104/70 | HR 65 | Ht 64.0 in | Wt 125.0 lb

## 2018-01-30 DIAGNOSIS — R0600 Dyspnea, unspecified: Secondary | ICD-10-CM | POA: Diagnosis not present

## 2018-01-30 DIAGNOSIS — R0602 Shortness of breath: Secondary | ICD-10-CM | POA: Diagnosis not present

## 2018-01-30 NOTE — Progress Notes (Signed)
Date:  01/30/2018   Name:  Jennifer Garrison   DOB:  May 11, 1960   MRN:  [id number]   Chief Complaint: Shortness of Breath (X 2 years. No Chest Pain. Can happen when laying bed or sitting up. Sometimes feels like not getting enough air in lungs. Becoming more frequent. Unsure of the cause. Never used inhaler before.  )  Shortness of Breath  This is a recurrent problem. The current episode started more than 1 year ago. The problem occurs every several days. The problem has been gradually worsening. Pertinent negatives include no abdominal pain, chest pain, fever, headaches, leg swelling, PND, rash, syncope or wheezing. She has tried nothing for the symptoms.  She does not smoke, has no occupational exposure, hx of asthma or allergies.  No specific triggers.  Does not wake her from sleep.    Review of Systems  Constitutional: Negative for chills, fatigue and fever.  HENT: Negative for trouble swallowing.   Eyes: Negative for visual disturbance.  Respiratory: Positive for shortness of breath. Negative for cough, chest tightness and wheezing.   Cardiovascular: Negative for chest pain, palpitations, leg swelling, syncope and PND.  Gastrointestinal: Negative for abdominal pain and nausea.  Genitourinary: Negative for dysuria.  Musculoskeletal: Negative for arthralgias.  Skin: Negative for color change and rash.  Allergic/Immunologic: Negative for environmental allergies.  Neurological: Negative for dizziness, syncope, weakness, light-headedness and headaches.  Hematological: Negative for adenopathy.  Psychiatric/Behavioral: Negative for dysphoric mood and sleep disturbance. The patient is not nervous/anxious.     Patient Active Problem List   Diagnosis Date Noted  . Major depressive disorder with single episode, in partial remission ([place]) 05/01/2017  . Irritable bowel syndrome with both constipation and diarrhea 02/21/2017  . Trigger middle finger of left hand 02/21/2017    No Known  Allergies  Past Surgical History:  Procedure Laterality Date  . ABDOMINAL HYSTERECTOMY  02/2016   Only cervix remains (done for prolapse)  . AUGMENTATION MAMMAPLASTY Bilateral 2010  . BREAST CYST ASPIRATION Left 2010   neg  . BREAST ENHANCEMENT SURGERY  1998  . CHOLECYSTECTOMY    . TYMPANOPLASTY Left     Social History   Tobacco Use  . Smoking status: Former Smoker    Types: Cigarettes    Last attempt to quit: 09/09/1992    Years since quitting: 25.4  . Smokeless tobacco: Never Used  Substance Use Topics  . Alcohol use: Yes    Comment: socially   . Drug use: No     Medication list has been reviewed and updated.  Current Meds  Medication Sig  . buPROPion (WELLBUTRIN SR) 150 MG 12 hr tablet Take 1 tablet (150 mg total) by mouth 2 (two) times daily.    PHQ 2/9 Scores 01/09/2018 04/14/2017 04/14/2017  PHQ - 2 Score 0 2 0  PHQ- 9 Score 0 4 -    Physical Exam  Constitutional: She is oriented to person, place, and time. She appears well-developed. No distress.  HENT:  Head: Normocephalic and atraumatic.  Neck: Normal range of motion. Neck supple.  Cardiovascular: Normal rate, regular rhythm and normal heart sounds. Exam reveals no decreased pulses.  Pulmonary/Chest: Effort normal and breath sounds normal. No respiratory distress. She has no wheezes. She has no rhonchi. She has no rales.  Abdominal: Soft. Bowel sounds are normal. She exhibits no distension and no mass. There is no guarding.  Musculoskeletal: Normal range of motion.       Right lower leg: Normal. She exhibits  no edema.       Left lower leg: Normal. She exhibits no edema.  Lymphadenopathy:    She has no cervical adenopathy.  Neurological: She is alert and oriented to person, place, and time.  Skin: Skin is warm and dry. No rash noted.  Psychiatric: She has a normal mood and affect. Her behavior is normal. Thought content normal.  Nursing note and vitals reviewed. Several times during the exam she took a  deep sighing breath and felt the need to yawn.  BP 104/70 (BP Location: Right Arm, Patient Position: Sitting, Cuff Size: Normal)   Pulse 65   Ht 5\' 4"  (1.626 m)   Wt 125 lb (56.7 kg)   SpO2 99%   BMI 21.46 kg/m   Assessment and Plan: 1. Dyspnea, unspecified type Suspect PVCs causing her sx - will likely need to get Zio monitor - EKG 12-Lead - SR @ 60, WNL - DG Chest 2 View; Future   Partially dictated using Editor, commissioning. Any errors are unintentional.  Halina Maidens, MD [facility]  01/30/2018

## 2018-01-31 ENCOUNTER — Ambulatory Visit (INDEPENDENT_AMBULATORY_CARE_PROVIDER_SITE_OTHER): Payer: BLUE CROSS/BLUE SHIELD

## 2018-01-31 DIAGNOSIS — R002 Palpitations: Secondary | ICD-10-CM

## 2018-03-02 ENCOUNTER — Other Ambulatory Visit: Payer: Self-pay | Admitting: Internal Medicine

## 2018-03-02 DIAGNOSIS — R0600 Dyspnea, unspecified: Secondary | ICD-10-CM | POA: Insufficient documentation

## 2018-03-08 ENCOUNTER — Other Ambulatory Visit: Payer: Self-pay

## 2018-03-08 ENCOUNTER — Institutional Professional Consult (permissible substitution): Payer: BLUE CROSS/BLUE SHIELD | Admitting: Internal Medicine

## 2018-03-08 DIAGNOSIS — R002 Palpitations: Secondary | ICD-10-CM

## 2018-05-02 ENCOUNTER — Encounter: Payer: BLUE CROSS/BLUE SHIELD | Admitting: Internal Medicine

## 2018-05-18 DIAGNOSIS — J0100 Acute maxillary sinusitis, unspecified: Secondary | ICD-10-CM | POA: Diagnosis not present

## 2018-06-05 DIAGNOSIS — J0100 Acute maxillary sinusitis, unspecified: Secondary | ICD-10-CM | POA: Diagnosis not present

## 2018-06-05 DIAGNOSIS — R509 Fever, unspecified: Secondary | ICD-10-CM | POA: Diagnosis not present

## 2018-06-15 ENCOUNTER — Ambulatory Visit (INDEPENDENT_AMBULATORY_CARE_PROVIDER_SITE_OTHER): Payer: BLUE CROSS/BLUE SHIELD | Admitting: Internal Medicine

## 2018-06-15 ENCOUNTER — Encounter: Payer: Self-pay | Admitting: Internal Medicine

## 2018-06-15 ENCOUNTER — Other Ambulatory Visit: Payer: Self-pay

## 2018-06-15 VITALS — BP 132/64 | HR 68 | Temp 98.4°F | Ht 64.0 in | Wt 124.0 lb

## 2018-06-15 DIAGNOSIS — J0101 Acute recurrent maxillary sinusitis: Secondary | ICD-10-CM

## 2018-06-15 MED ORDER — PREDNISONE 10 MG PO TABS: 10.0000 mg | ORAL_TABLET | ORAL | 0 refills | Status: AC

## 2018-06-15 MED ORDER — LEVOFLOXACIN 500 MG PO TABS: 500.0000 mg | ORAL_TABLET | Freq: Every day | ORAL | 0 refills | Status: AC

## 2018-06-15 NOTE — Progress Notes (Signed)
Date:  06/15/2018   Name:  Jennifer Garrison   DOB:  1960/10/13   MRN:  [id number]   Chief Complaint: Cough (Seen [facility] twice. This has been happening for 5 weeks. Was given Feb21st, Augmentin 875 mg for 7 days and prednisone once daily for few days. Second visit on 10th of March was given doxycycline 100 mg, just finished those on 17th. Cough- green production. Body Aches. Ears pop when blowing nose. Tried allergy medicine and did not help. )  Cough  This is a chronic problem. The current episode started more than 1 month ago. The problem has been unchanged. The problem occurs every few minutes. The cough is productive of sputum. Associated symptoms include ear pain, headaches (facial pain) and nasal congestion. Pertinent negatives include no chest pain, chills, fever, sore throat, shortness of breath or wheezing. Treatments tried: 2 courses of antibiotics - augmentin and doxycyline. The treatment provided no relief. Her past medical history is significant for environmental allergies.    Review of Systems  Constitutional: Positive for fatigue. Negative for chills, fever and unexpected weight change.  HENT: Positive for congestion, ear pain, sinus pressure and sinus pain. Negative for dental problem, sore throat, tinnitus and trouble swallowing.   Respiratory: Positive for cough. Negative for chest tightness, shortness of breath and wheezing.   Cardiovascular: Negative for chest pain and palpitations.  Gastrointestinal: Negative for abdominal pain.  Allergic/Immunologic: Positive for environmental allergies.  Neurological: Positive for headaches (facial pain).    Patient Active Problem List   Diagnosis Date Noted  . Dyspnea 03/02/2018  . Major depressive disorder with single episode, in partial remission ([place]) 05/01/2017  . Irritable bowel syndrome with both constipation and diarrhea 02/21/2017  . Trigger middle finger of left hand 02/21/2017    No Known Allergies  Past Surgical  History:  Procedure Laterality Date  . ABDOMINAL HYSTERECTOMY  02/2016   Only cervix remains (done for prolapse)  . AUGMENTATION MAMMAPLASTY Bilateral 2010  . BREAST CYST ASPIRATION Left 2010   neg  . BREAST ENHANCEMENT SURGERY  1998  . CHOLECYSTECTOMY    . TYMPANOPLASTY Left     Social History   Tobacco Use  . Smoking status: Former Smoker    Types: Cigarettes    Last attempt to quit: 09/09/1992    Years since quitting: 25.7  . Smokeless tobacco: Never Used  Substance Use Topics  . Alcohol use: Yes    Comment: socially   . Drug use: No     Medication list has been reviewed and updated.  Current Meds  Medication Sig  . buPROPion (WELLBUTRIN SR) 150 MG 12 hr tablet Take 1 tablet (150 mg total) by mouth 2 (two) times daily.    PHQ 2/9 Scores 01/09/2018 04/14/2017 04/14/2017  PHQ - 2 Score 0 2 0  PHQ- 9 Score 0 4 -    Physical Exam HENT:     Right Ear: Ear canal normal. Tympanic membrane is scarred.     Left Ear: Tympanic membrane and ear canal normal.     Nose:     Right Sinus: Maxillary sinus tenderness present. No frontal sinus tenderness.     Left Sinus: Maxillary sinus tenderness present. No frontal sinus tenderness.     Mouth/Throat:     Pharynx: Oropharynx is clear. No posterior oropharyngeal erythema.     Tonsils: 2+ on the right. 2+ on the left.     Wt Readings from Last 3 Encounters:  06/15/18 124 lb (56.2 kg)  01/30/18 125 lb (56.7 kg)  01/09/18 126 lb (57.2 kg)    BP 132/64   Pulse 68   Temp 98.4 F (36.9 C) (Oral)   Ht 5\' 4"  (1.626 m)   Wt 124 lb (56.2 kg)   SpO2 97%   BMI 21.28 kg/m   Assessment and Plan: 1. Acute recurrent maxillary sinusitis Needs ENT evaluation for possible obstructing lesion, etc Begin Mucinex-D, increase fluids - levofloxacin (LEVAQUIN) 500 MG tablet; Take 1 tablet (500 mg total) by mouth daily for 7 days.  Dispense: 7 tablet; Refill: 0 - predniSONE (DELTASONE) 10 MG tablet; Take 1 tablet (10 mg total) by mouth as  directed for 6 days. Take 6,5,4,3,2,1 then stop  Dispense: 21 tablet; Refill: 0 - Ambulatory referral to ENT   Partially dictated using Dragon software. Any errors are unintentional.  Halina Maidens, MD [facility]  06/15/2018

## 2018-08-01 DIAGNOSIS — J301 Allergic rhinitis due to pollen: Secondary | ICD-10-CM | POA: Diagnosis not present

## 2018-08-01 DIAGNOSIS — J329 Chronic sinusitis, unspecified: Secondary | ICD-10-CM | POA: Diagnosis not present

## 2018-08-01 DIAGNOSIS — J342 Deviated nasal septum: Secondary | ICD-10-CM | POA: Diagnosis not present

## 2018-08-22 ENCOUNTER — Other Ambulatory Visit: Payer: Self-pay | Admitting: Internal Medicine

## 2018-08-22 DIAGNOSIS — F324 Major depressive disorder, single episode, in partial remission: Secondary | ICD-10-CM

## 2018-09-05 ENCOUNTER — Ambulatory Visit (INDEPENDENT_AMBULATORY_CARE_PROVIDER_SITE_OTHER): Payer: BC Managed Care – PPO | Admitting: Physician Assistant

## 2018-09-05 ENCOUNTER — Other Ambulatory Visit: Payer: Self-pay

## 2018-09-05 ENCOUNTER — Encounter: Payer: Self-pay | Admitting: Physician Assistant

## 2018-09-05 VITALS — BP 105/72 | HR 62 | Temp 97.6°F | Ht 64.0 in | Wt 118.0 lb

## 2018-09-05 DIAGNOSIS — L299 Pruritus, unspecified: Secondary | ICD-10-CM

## 2018-09-05 DIAGNOSIS — Z0000 Encounter for general adult medical examination without abnormal findings: Secondary | ICD-10-CM

## 2018-09-05 DIAGNOSIS — W57XXXS Bitten or stung by nonvenomous insect and other nonvenomous arthropods, sequela: Secondary | ICD-10-CM | POA: Diagnosis not present

## 2018-09-05 DIAGNOSIS — F324 Major depressive disorder, single episode, in partial remission: Secondary | ICD-10-CM

## 2018-09-05 DIAGNOSIS — Z23 Encounter for immunization: Secondary | ICD-10-CM

## 2018-09-05 MED ORDER — TRIAMCINOLONE ACETONIDE 0.1 % EX CREA: 1.0000 "application " | TOPICAL_CREAM | Freq: Two times a day (BID) | CUTANEOUS | 0 refills | Status: DC

## 2018-09-05 NOTE — Patient Instructions (Signed)
Take wellbutrin 150 mg daily for two weeks. Then can take 150 mg every other day for two weeks.

## 2018-09-05 NOTE — Progress Notes (Signed)
Patient: Jennifer Garrison, Female    DOB: 02-08-1961, 58 y.o.   MRN: [id number] Visit Date: 09/05/2018  Today's Provider: Trinna Post, PA-C   Chief Complaint  Patient presents with  . New Patient (Initial Visit)   Subjective:  I, Porsha McClurkin CMA, am acting as a Education administrator for [employer].     New Patient Appointment Jennifer Garrison is a 58 y.o. female who presents today for new patient appointment. She feels well. Living with husband of 16 years, has a 34 year old child. She works as a Sales promotion account executive. Originally from [place] and moved here for [employer] job.  Patient states she has been getting tick bites since April,2020 and has a spot on her back from a tick bite that is red, swollen and itchy. Patient states she wakes up every morning with green drainage from her nostrils and she has been to ENT Dr. Richardson Landry for it. She was placed on 3 weeks of antibiotics. She reports exercising walks dog. She reports she is sleeping well.  Reports she is taking wellbutrin 150 mg BID for anxiety. Reports many years ago she was on vacation and was unable to use the bathroom. She reports she was told this was due to anxiety and prescribed this medication. She is not sure she needs this anymore. At one point she went on a trip and forgot the wellbutrin for a week and reports she felt no different. ----------------------------------------------------------------- Last pap: 2017 due to having partial hysterectomy for uterine prolapseper patient.   Last mammogram: 05/01/2017 benign, no family history Colon Cancer Screening: Done at age 33, does not remember which provider in [place], reports it is normal.   Review of Systems  Constitutional: Positive for fatigue.  HENT: Positive for congestion, hearing loss, postnasal drip, rhinorrhea, sinus pressure and sneezing.   Eyes: Positive for photophobia and itching.  Respiratory: Negative.   Cardiovascular: Negative.   Gastrointestinal: Positive  for constipation.  Endocrine: Positive for cold intolerance.  Genitourinary: Negative.   Musculoskeletal: Negative.   Skin: Negative.   Allergic/Immunologic: Negative.   Neurological: Negative.   Hematological: Negative.   Psychiatric/Behavioral: Negative.     Social History She  reports that she quit smoking about 26 years ago. Her smoking use included cigarettes. She has never used smokeless tobacco. She reports current alcohol use. She reports that she does not use drugs. Social History   Socioeconomic History  . Marital status: Married    Spouse name: Not on file  . Number of children: 1  . Years of education: Not on file  . Highest education level: Not on file  Occupational History  . Not on file  Social Needs  . Financial resource strain: Not on file  . Food insecurity:    Worry: Not on file    Inability: Not on file  . Transportation needs:    Medical: Not on file    Non-medical: Not on file  Tobacco Use  . Smoking status: Former Smoker    Types: Cigarettes    Last attempt to quit: 09/09/1992    Years since quitting: 26.0  . Smokeless tobacco: Never Used  Substance and Sexual Activity  . Alcohol use: Yes    Comment: socially   . Drug use: No  . Sexual activity: Never  Lifestyle  . Physical activity:    Days per week: Not on file    Minutes per session: Not on file  . Stress: Not on file  Relationships  .  Social connections:    Talks on phone: Not on file    Gets together: Not on file    Attends religious service: Not on file    Active member of club or organization: Not on file    Attends meetings of clubs or organizations: Not on file    Relationship status: Not on file  Other Topics Concern  . Not on file  Social History Narrative  . Not on file    Patient Active Problem List   Diagnosis Date Noted  . Dyspnea 03/02/2018  . Major depressive disorder with single episode, in partial remission ([place]) 05/01/2017  . Irritable bowel syndrome with both  constipation and diarrhea 02/21/2017  . Trigger middle finger of left hand 02/21/2017    Past Surgical History:  Procedure Laterality Date  . ABDOMINAL HYSTERECTOMY  02/2016   Only cervix remains (done for prolapse)  . AUGMENTATION MAMMAPLASTY Bilateral 2010  . BREAST CYST ASPIRATION Left 2010   neg  . BREAST ENHANCEMENT SURGERY  1998  . CHOLECYSTECTOMY    . TYMPANOPLASTY Left     Family History  Family Status  Relation Name Status  . Mother  Deceased  . Father  Deceased  . Neg Hx  (Not Specified)   Her family history includes Hypertension in her mother; Lung cancer in her mother; Lupus in her father.     No Known Allergies  Previous Medications   BUPROPION (WELLBUTRIN SR) 150 MG 12 HR TABLET    Take 1 tablet by mouth twice daily    Patient Care Team: Paulene Floor as PCP - General (Physician Assistant)      Objective:   Vitals: BP 105/72 (BP Location: Left Arm, Patient Position: Sitting, Cuff Size: Normal)   Pulse 62   Temp 97.6 F (36.4 C) (Oral)   Ht 5\' 4"  (1.626 m)   Wt 118 lb (53.5 kg)   BMI 20.25 kg/m    Physical Exam Constitutional:      Appearance: Normal appearance.  Cardiovascular:     Rate and Rhythm: Normal rate and regular rhythm.     Heart sounds: Normal heart sounds.  Pulmonary:     Effort: Pulmonary effort is normal.     Breath sounds: Normal breath sounds.  Abdominal:     General: Abdomen is flat.     Palpations: Abdomen is soft.  Skin:    General: Skin is warm and dry.       Neurological:     Mental Status: She is alert and oriented to person, place, and time. Mental status is at baseline.  Psychiatric:        Mood and Affect: Mood normal.        Behavior: Behavior normal.      Depression Screen PHQ 2/9 Scores 09/05/2018 01/09/2018 04/14/2017 04/14/2017  PHQ - 2 Score 2 0 2 0  PHQ- 9 Score 6 0 4 -      Assessment & Plan:     Routine Health Maintenance and Physical Exam  Exercise Activities and Dietary  recommendations Goals   None     Immunization History  Administered Date(s) Administered  . Influenza,inj,Quad PF,6+ Mos 01/09/2018  . Influenza-Unspecified 12/26/2016    Health Maintenance  Topic Date Due  . TETANUS/TDAP  09/03/1978  . COLONOSCOPY  09/02/2009  . MAMMOGRAM  04/24/2018  . INFLUENZA VACCINE  10/27/2018  . PAP SMEAR-Modifier  05/13/2019  . Hepatitis C Screening  Completed  . HIV Screening  Completed  Discussed health benefits of physical activity, and encouraged her to engage in regular exercise appropriate for her age and condition.    1. Annual physical exam   2. Major depressive disorder with single episode, in partial remission ([place])  Instructed how to taper off wellbutrin.  3. Tick bite, sequela  Do not think she needs prophylaxis. Can spray clothes with DEET, wear long sleeves and pants and check after walking. Steroid cream for itching.  4. Itching  - triamcinolone cream (KENALOG) 0.1 %; Apply 1 application topically 2 (two) times daily.  Dispense: 30 g; Refill: 0  5. Need for Tdap vaccination  - Tdap vaccine greater than or equal to 7yo IM  The entirety of the information documented in the History of Present Illness, Review of Systems and Physical Exam were personally obtained by me. Portions of this information were initially documented by [facility], CMA and reviewed by me for thoroughness and accuracy.   F/u 1 year CPE. --------------------------------------------------------------------

## 2019-01-07 ENCOUNTER — Telehealth: Payer: Self-pay

## 2019-01-07 NOTE — Telephone Encounter (Signed)
Patient calling asking if Jennifer Garrison can send in Eucrisa Cream to the pharmacy for her. Reports that she was laying in  bed and her dog jum over and tear off some skin in her nose yesterday and today it looks close but she wants to apply something in it.  Pharmacy:[facility] [address]

## 2019-01-07 NOTE — Telephone Encounter (Signed)
Needs appointment for new rx. This is also typically for eczema and not injuries.

## 2019-01-08 NOTE — Telephone Encounter (Signed)
Patient scheduled ov for 01/09/2019 at 8:20.

## 2019-01-09 ENCOUNTER — Ambulatory Visit (INDEPENDENT_AMBULATORY_CARE_PROVIDER_SITE_OTHER): Payer: BC Managed Care – PPO | Admitting: Physician Assistant

## 2019-01-09 ENCOUNTER — Other Ambulatory Visit: Payer: Self-pay

## 2019-01-09 ENCOUNTER — Encounter: Payer: Self-pay | Admitting: Physician Assistant

## 2019-01-09 VITALS — BP 105/68 | HR 69 | Temp 96.9°F | Wt 124.0 lb

## 2019-01-09 DIAGNOSIS — Z1239 Encounter for other screening for malignant neoplasm of breast: Secondary | ICD-10-CM

## 2019-01-09 DIAGNOSIS — L989 Disorder of the skin and subcutaneous tissue, unspecified: Secondary | ICD-10-CM | POA: Diagnosis not present

## 2019-01-09 MED ORDER — MUPIROCIN CALCIUM 2 % EX CREA: 1.0000 "application " | TOPICAL_CREAM | Freq: Two times a day (BID) | CUTANEOUS | 0 refills | Status: DC

## 2019-01-09 NOTE — Progress Notes (Signed)
       Patient: Jennifer Garrison Female    DOB: Apr 17, 1960   58 y.o.   MRN: [id number] Visit Date: 01/09/2019  Today's Provider: Trinna Post, PA-C   Chief Complaint  Patient presents with  . Abrasion   Subjective:     HPI   The other day her dog scratched her face and caused a tear on her nose. She had a recent tetanus shot on 08/2018. She denies redness, swelling, fevers, and chills.   No Known Allergies   Current Outpatient Medications:  .  buPROPion (WELLBUTRIN SR) 150 MG 12 hr tablet, Take 1 tablet by mouth twice daily, Disp: 60 tablet, Rfl: 4 .  triamcinolone cream (KENALOG) 0.1 %, Apply 1 application topically 2 (two) times daily., Disp: 30 g, Rfl: 0 .  mupirocin cream (BACTROBAN) 2 %, Apply 1 application topically 2 (two) times daily., Disp: 15 g, Rfl: 0  Review of Systems  Constitutional: Positive for fatigue. Negative for activity change, appetite change, chills, diaphoresis, fever and unexpected weight change.  Neurological: Negative for dizziness, light-headedness and headaches.    Social History   Tobacco Use  . Smoking status: Former Smoker    Types: Cigarettes    Quit date: 09/09/1992    Years since quitting: 26.3  . Smokeless tobacco: Never Used  Substance Use Topics  . Alcohol use: Yes    Comment: socially       Objective:   BP 105/68 (BP Location: Left Arm, Patient Position: Sitting, Cuff Size: Normal)   Pulse 69   Temp (!) 96.9 F (36.1 C) (Temporal)   Wt 124 lb (56.2 kg)   BMI 21.28 kg/m  Vitals:   01/09/19 0825  BP: 105/68  Pulse: 69  Temp: (!) 96.9 F (36.1 C)  TempSrc: Temporal  Weight: 124 lb (56.2 kg)  Body mass index is 21.28 kg/m.   Physical Exam Constitutional:      Appearance: Normal appearance.  HENT:     Head:   Skin:    General: Skin is warm and dry.  Neurological:     Mental Status: She is alert.      No results found for any visits on 01/09/19.     Assessment & Plan    1. Skin lesion  - mupirocin  cream (BACTROBAN) 2 %; Apply 1 application topically 2 (two) times daily.  Dispense: 15 g; Refill: 0  2. Encounter for screening for malignant neoplasm of breast, unspecified screening modality  Have ordered this and provided contact card.   - MM Digital Screening; Future  The entirety of the information documented in the History of Present Illness, Review of Systems and Physical Exam were personally obtained by me. Portions of this information were initially documented by Ashley Royalty, CMA and reviewed by me for thoroughness and accuracy.   F/u PRN    Trinna Post, PA-C  [facility]

## 2019-01-09 NOTE — Patient Instructions (Signed)
Wound Care, Adult Taking care of your wound properly can help to prevent pain, infection, and scarring. It can also help your wound to heal more quickly. How to care for your wound Wound care      Follow instructions from your health care provider about how to take care of your wound. Make sure you: ? Wash your hands with soap and water before you change the bandage (dressing). If soap and water are not available, use hand sanitizer. ? Change your dressing as told by your health care provider. ? Leave stitches (sutures), skin glue, or adhesive strips in place. These skin closures may need to stay in place for 2 weeks or longer. If adhesive strip edges start to loosen and curl up, you may trim the loose edges. Do not remove adhesive strips completely unless your health care provider tells you to do that.  Check your wound area every day for signs of infection. Check for: ? Redness, swelling, or pain. ? Fluid or blood. ? Warmth. ? Pus or a bad smell.  Ask your health care provider if you should clean the wound with mild soap and water. Doing this may include: ? Using a clean towel to pat the wound dry after cleaning it. Do not rub or scrub the wound. ? Applying a cream or ointment. Do this only as told by your health care provider. ? Covering the incision with a clean dressing.  Ask your health care provider when you can leave the wound uncovered.  Keep the dressing dry until your health care provider says it can be removed. Do not take baths, swim, use a hot tub, or do anything that would put the wound underwater until your health care provider approves. Ask your health care provider if you can take showers. You may only be allowed to take sponge baths. Medicines   If you were prescribed an antibiotic medicine, cream, or ointment, take or use the antibiotic as told by your health care provider. Do not stop taking or using the antibiotic even if your condition improves.  Take  over-the-counter and prescription medicines only as told by your health care provider. If you were prescribed pain medicine, take it 30 or more minutes before you do any wound care or as told by your health care provider. General instructions  Return to your normal activities as told by your health care provider. Ask your health care provider what activities are safe.  Do not scratch or pick at the wound.  Do not use any products that contain nicotine or tobacco, such as cigarettes and e-cigarettes. These may delay wound healing. If you need help quitting, ask your health care provider.  Keep all follow-up visits as told by your health care provider. This is important.  Eat a diet that includes protein, vitamin A, vitamin C, and other nutrient-rich foods to help the wound heal. ? Foods rich in protein include meat, dairy, beans, nuts, and other sources. ? Foods rich in vitamin A include carrots and dark green, leafy vegetables. ? Foods rich in vitamin C include citrus, tomatoes, and other fruits and vegetables. ? Nutrient-rich foods have protein, carbohydrates, fat, vitamins, or minerals. Eat a variety of healthy foods including vegetables, fruits, and whole grains. Contact a health care provider if:  You received a tetanus shot and you have swelling, severe pain, redness, or bleeding at the injection site.  Your pain is not controlled with medicine.  You have redness, swelling, or pain around the wound.    You have fluid or blood coming from the wound.  Your wound feels warm to the touch.  You have pus or a bad smell coming from the wound.  You have a fever or chills.  You are nauseous or you vomit.  You are dizzy. Get help right away if:  You have a red streak going away from your wound.  The edges of the wound open up and separate.  Your wound is bleeding, and the bleeding does not stop with gentle pressure.  You have a rash.  You faint.  You have trouble breathing.  Summary  Always wash your hands with soap and water before changing your bandage (dressing).  To help with healing, eat foods that are rich in protein, vitamin A, vitamin C, and other nutrients.  Check your wound every day for signs of infection. Contact your health care provider if you suspect that your wound is infected. This information is not intended to replace advice given to you by your health care provider. Make sure you discuss any questions you have with your health care provider. Document Released: 12/22/2007 Document Revised: 07/02/2018 Document Reviewed: 09/29/2015 Elsevier Patient Education  2020 Elsevier Inc.  

## 2019-01-10 ENCOUNTER — Telehealth: Payer: Self-pay

## 2019-01-10 DIAGNOSIS — L989 Disorder of the skin and subcutaneous tissue, unspecified: Secondary | ICD-10-CM

## 2019-01-10 MED ORDER — MUPIROCIN 2 % EX OINT: 1.0000 "application " | TOPICAL_OINTMENT | Freq: Two times a day (BID) | CUTANEOUS | 0 refills | Status: DC

## 2019-01-10 NOTE — Telephone Encounter (Signed)
Yes it's fine.

## 2019-01-10 NOTE — Telephone Encounter (Signed)
Pt states mupirocin cream is out of stock at [facility].  They do have the ointment in stock.  Please send in a RX for the ointment.   Thanks,   -Mickel Baas

## 2019-01-10 NOTE — Telephone Encounter (Signed)
Pharmacist Shirlean Mylar called wanting to know of the Bactroban cream could be changed to the ointment. She says the cream is more expensive Please call back. [facility] [address] [place]

## 2019-01-10 NOTE — Telephone Encounter (Signed)
Is this ok with you  ?

## 2019-01-16 ENCOUNTER — Encounter: Payer: BLUE CROSS/BLUE SHIELD | Admitting: Internal Medicine

## 2019-04-08 IMAGING — CR DG CHEST 2V
2 series · 2 of 2 positions shown · non-contrast
Comparison: None.

CLINICAL DATA: Worsening shortness of breath over the past several
weeks. Chronic shortness of breath over the past 2 years. Occasional
orthopnea. Former smoker.

EXAM:
CHEST - 2 VIEW

[chest lat]
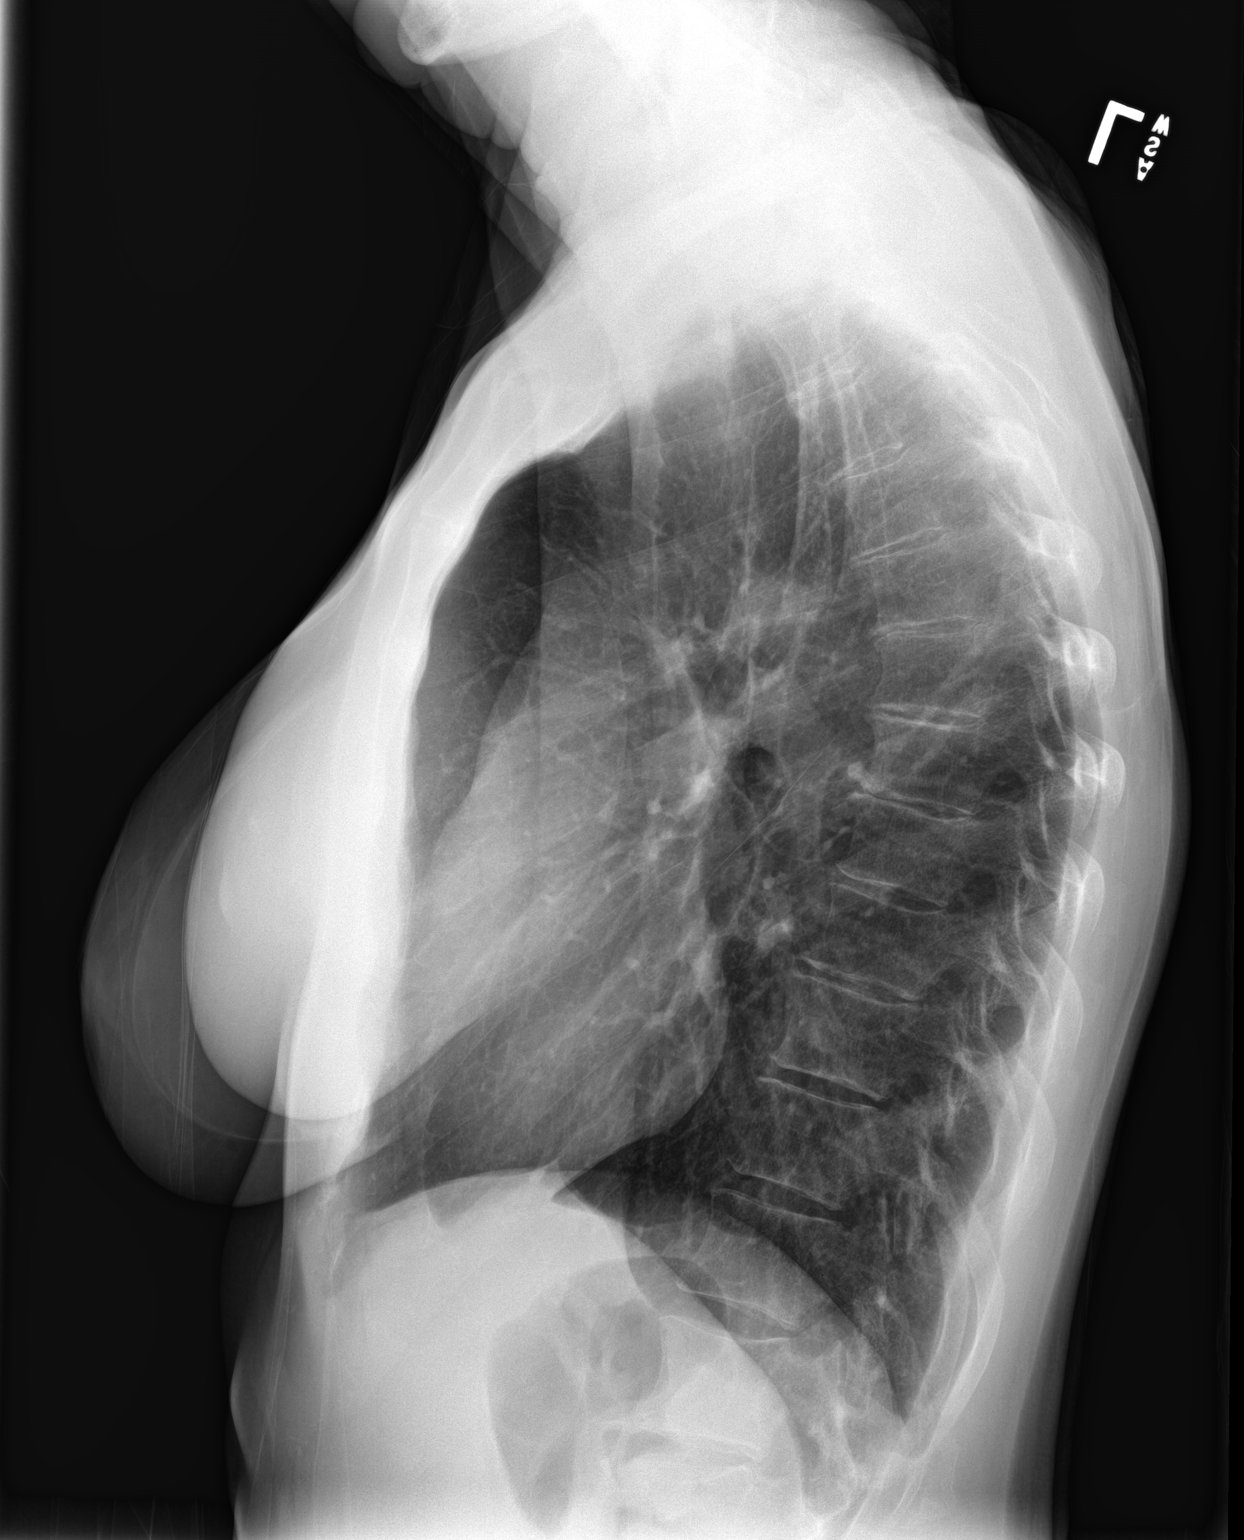

[chest pa]
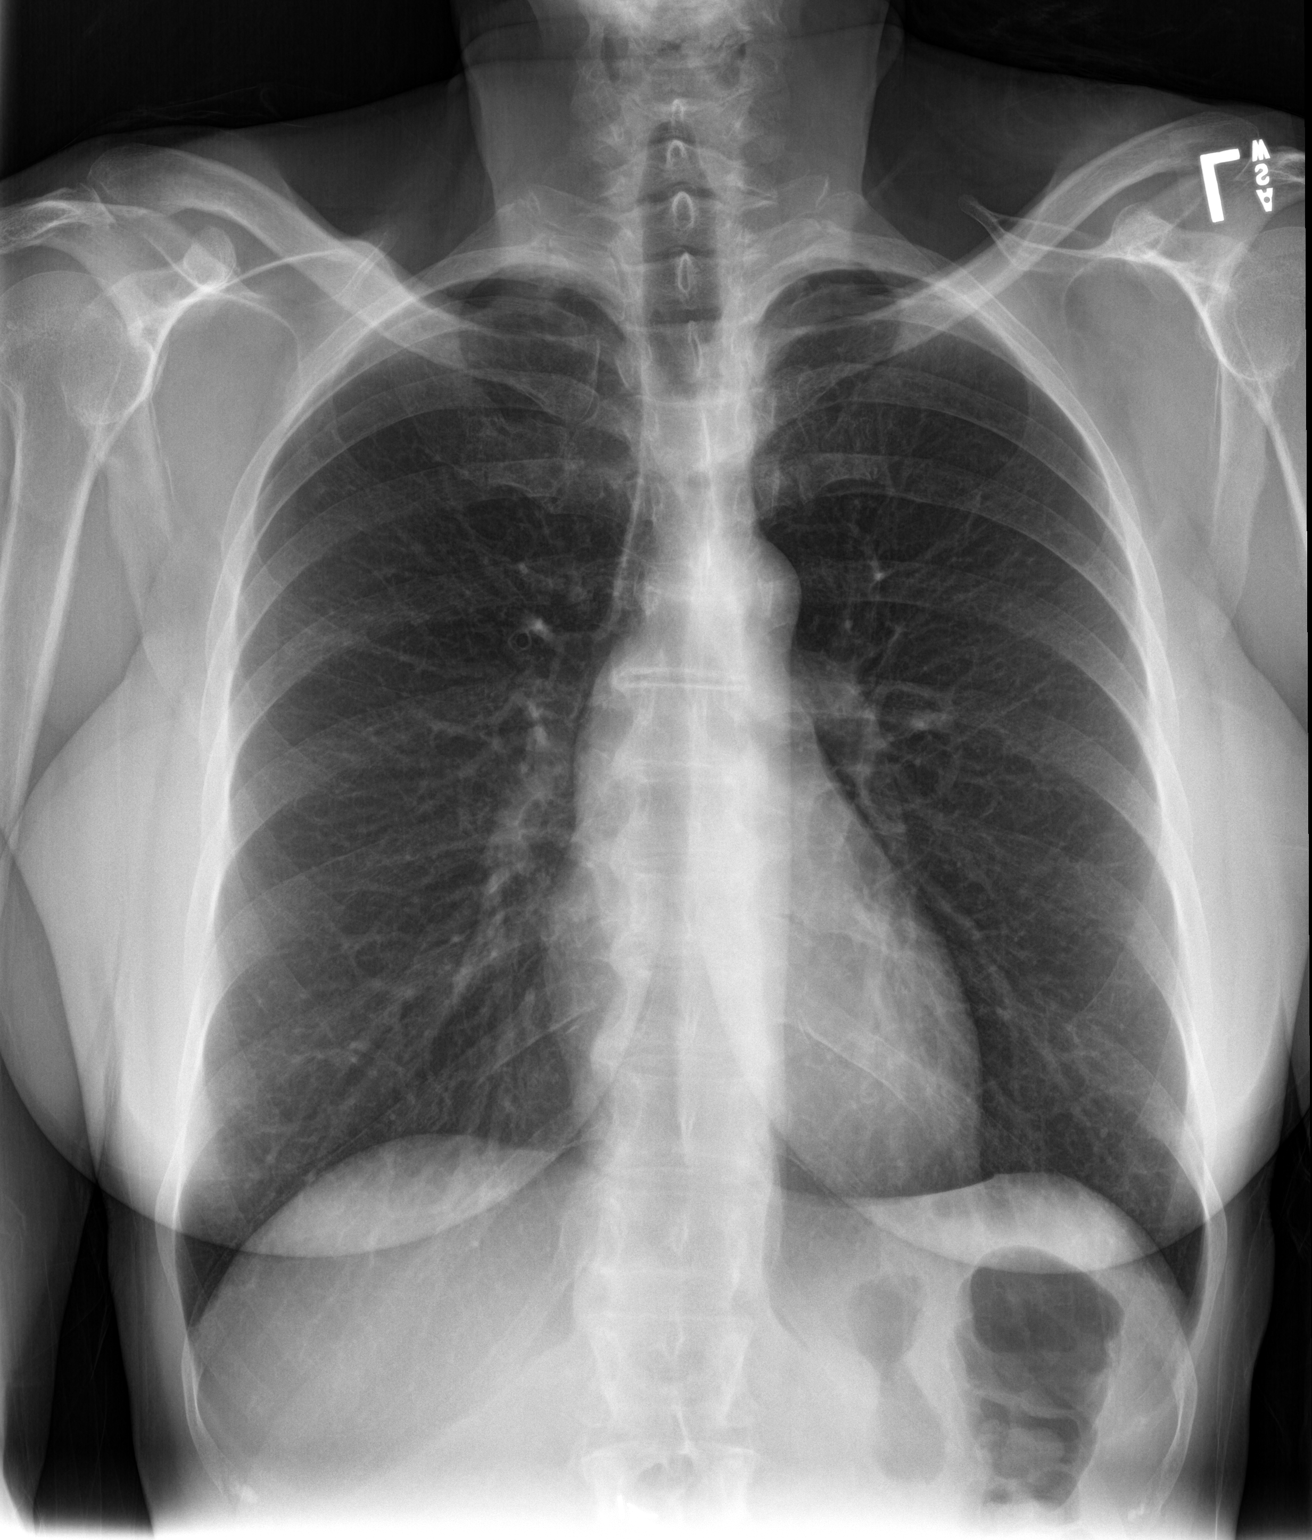

[2 of 2 positions shown; findings below may reference images not displayed]

FINDINGS: The lungs are mildly hyperinflated but clear. The heart and
pulmonary vascularity are normal. The mediastinum is normal in
width. There is no pleural effusion. The bony thorax is
unremarkable. Saline breast implants are present bilaterally.
IMPRESSION: Mild hyperinflation may be voluntary or may reflect reactive airway
disease or COPD. No pneumonia, CHF, nor other acute cardiopulmonary
abnormality.

## 2019-06-13 ENCOUNTER — Other Ambulatory Visit: Payer: Self-pay

## 2019-06-13 ENCOUNTER — Encounter: Payer: Self-pay | Admitting: Physician Assistant

## 2019-06-13 ENCOUNTER — Ambulatory Visit (INDEPENDENT_AMBULATORY_CARE_PROVIDER_SITE_OTHER): Payer: BC Managed Care – PPO | Admitting: Physician Assistant

## 2019-06-13 VITALS — BP 110/68 | HR 55 | Temp 95.6°F | Wt 125.2 lb

## 2019-06-13 DIAGNOSIS — F324 Major depressive disorder, single episode, in partial remission: Secondary | ICD-10-CM

## 2019-06-13 MED ORDER — BUPROPION HCL ER (SR) 150 MG PO TB12: 150.0000 mg | ORAL_TABLET | Freq: Two times a day (BID) | ORAL | 3 refills | Status: DC

## 2019-06-13 NOTE — Patient Instructions (Signed)
Depression Screening Depression screening is a tool that your health care provider can use to learn if you have symptoms of depression. Depression is a common condition with many symptoms that are also often found in other conditions. Depression is treatable, but it must first be diagnosed. You may not know that certain feelings, thoughts, and behaviors that you are having can be symptoms of depression. Taking a depression screening test can help you and your health care provider decide if you need more assessment, or if you should be referred to a mental health care provider. What are the screening tests?  You may have a physical exam to see if another condition is affecting your mental health. You may have a blood or urine sample taken during the physical exam.  You may be interviewed using a screening tool that was developed from research, such as one of these: ? Patient Health Questionnaire (PHQ). This is a set of either 2 or 9 questions. A health care provider who has been trained to score this screening test uses a guide to assess if your symptoms suggest that you may have depression. ? Hamilton Depression Rating Scale (HAM-D). This is a set of either 17 or 24 questions. You may be asked to take it again during or after your treatment, to see if your depression has gotten better. ? Beck Depression Inventory (BDI). This is a set of 21 multiple choice questions. Your health care provider scores your answers to assess:  Your level of depression, ranging from mild to severe.  Your response to treatment.  Your health care provider may talk with you about your daily activities, such as eating, sleeping, work, and recreation, and ask if you have had any changes in activity.  Your health care provider may ask you to see a mental health specialist, such as a psychiatrist or psychologist, for more evaluation. Who should be screened for depression?   All adults, including adults with a family history  of a mental health disorder.  Adolescents who are 12-18 years old.  People who are recovering from a myocardial infarction (MI).  Pregnant women, or women who have given birth.  People who have a long-term (chronic) illness.  Anyone who has been diagnosed with another type of a mental health disorder.  Anyone who has symptoms that could show depression. What do my results mean? Your health care provider will review the results of your depression screening, physical exam, and lab tests. Positive screens suggest that you may have depression. Screening is the first step in getting the care that you may need. It is up to you to get your screening results. Ask your health care provider, or the department that is doing your screening tests, when your results will be ready. Talk with your health care provider about your results and diagnosis. A diagnosis of depression is made using the Diagnostic and Statistical Manual of Mental Disorders (DSM-V). This is a book that lists the number and type of symptoms that must be present for a health care provider to give a specific diagnosis.  Your health care provider may work with you to treat your symptoms of depression, or your health care provider may help you find a mental health provider who can assess, diagnose, and treat your depression. Get help right away if:  You have thoughts about hurting yourself or others. If you ever feel like you may hurt yourself or others, or have thoughts about taking your own life, get help right away. You   can go to your nearest emergency department or call:  Your local emergency services (911 in the [place]  A suicide crisis helpline, such as the National Suicide Prevention Lifeline at 1-800-273-8255. This is open 24 hours a day. Summary  Depression screening is the first step in getting the help that you may need.  If your screening test shows symptoms of depression (is positive), your health care provider may ask  you to see a mental health provider.  Anyone who is age 12 or older should be screened for depression. This information is not intended to replace advice given to you by your health care provider. Make sure you discuss any questions you have with your health care provider. Document Revised: 02/24/2017 Document Reviewed: 07/29/2016 Elsevier Patient Education  2020 Elsevier Inc.  

## 2019-06-13 NOTE — Progress Notes (Signed)
Patient: Jennifer Garrison Female    DOB: October 20, 1960   59 y.o.   MRN: [id number] Visit Date: 06/13/2019  Today's Provider: Trinna Post, PA-C   Chief Complaint  Patient presents with  . Depression   Subjective:     HPI  Depression Patient presents today for depression follow-up. Patient is currently taking Bupropion 150 mg 12 hr tablet twice daily. Patient states she had tried to stop her medication but noticed she became very angry and started back taking the medication. Depression screen [facility] 2/9 09/05/2018 01/09/2018 04/14/2017 04/14/2017  Decreased Interest 0 0 0 0  Down, Depressed, Hopeless 2 0 2 0  PHQ - 2 Score 2 0 2 0  Altered sleeping 3 0 1 -  Tired, decreased energy 1 0 1 -  Change in appetite 0 0 0 -  Feeling bad or failure about yourself  0 0 0 -  Trouble concentrating 0 0 0 -  Moving slowly or fidgety/restless 0 0 0 -  Suicidal thoughts 0 0 0 -  PHQ-9 Score 6 0 4 -  Difficult doing work/chores Not difficult at all Not difficult at all Somewhat difficult -    No Known Allergies   Current Outpatient Medications:  .  buPROPion (WELLBUTRIN SR) 150 MG 12 hr tablet, Take 1 tablet by mouth twice daily, Disp: 60 tablet, Rfl: 4 .  mupirocin cream (BACTROBAN) 2 %, Apply 1 application topically 2 (two) times daily., Disp: 15 g, Rfl: 0 .  mupirocin ointment (BACTROBAN) 2 %, Place 1 application into the nose 2 (two) times daily., Disp: 22 g, Rfl: 0 .  triamcinolone cream (KENALOG) 0.1 %, Apply 1 application topically 2 (two) times daily., Disp: 30 g, Rfl: 0  Review of Systems  Constitutional: Negative.   Respiratory: Negative.   Genitourinary: Negative.   Psychiatric/Behavioral: Positive for agitation. Negative for behavioral problems, decreased concentration and sleep disturbance. The patient is not nervous/anxious.     Social History   Tobacco Use  . Smoking status: Former Smoker    Types: Cigarettes    Quit date: 09/09/1992    Years since quitting: 26.7  .  Smokeless tobacco: Never Used  Substance Use Topics  . Alcohol use: Yes    Comment: socially       Objective:   BP 110/68 (BP Location: Left Arm, Patient Position: Sitting, Cuff Size: Normal)   Pulse (!) 55   Temp (!) 95.6 F (35.3 C) (Temporal)   Wt 125 lb 3.2 oz (56.8 kg)   SpO2 98%   BMI 21.49 kg/m  Vitals:   06/13/19 1332  BP: 110/68  Pulse: (!) 55  Temp: (!) 95.6 F (35.3 C)  TempSrc: Temporal  SpO2: 98%  Weight: 125 lb 3.2 oz (56.8 kg)  Body mass index is 21.49 kg/m.   Physical Exam Constitutional:      Appearance: Normal appearance.  Cardiovascular:     Rate and Rhythm: Normal rate and regular rhythm.     Heart sounds: Normal heart sounds.  Pulmonary:     Effort: Pulmonary effort is normal.     Breath sounds: Normal breath sounds.  Skin:    General: Skin is warm and dry.  Neurological:     Mental Status: She is alert and oriented to person, place, and time. Mental status is at baseline.  Psychiatric:        Mood and Affect: Mood normal.        Behavior: Behavior normal.  No results found for any visits on 06/13/19.     Assessment & Plan    1. Major depressive disorder with single episode, in partial remission ([place])  Follow up in 08/2019 for CPE and PAP.   - buPROPion (WELLBUTRIN SR) 150 MG 12 hr tablet; Take 1 tablet (150 mg total) by mouth 2 (two) times daily.  Dispense: 180 tablet; Refill: 3  The entirety of the information documented in the History of Present Illness, Review of Systems and Physical Exam were personally obtained by me. Portions of this information were initially documented by [facility] and reviewed by me for thoroughness and accuracy.       Trinna Post, PA-C  [facility]

## 2019-07-03 ENCOUNTER — Ambulatory Visit
Admission: RE | Admit: 2019-07-03 | Discharge: 2019-07-03 | Disposition: A | Discharge status: Home / Self Care | Payer: BC Managed Care – PPO | Source: Ambulatory Visit | Attending: Physician Assistant | Admitting: Physician Assistant

## 2019-07-03 DIAGNOSIS — Z1231 Encounter for screening mammogram for malignant neoplasm of breast: Secondary | ICD-10-CM | POA: Insufficient documentation

## 2019-07-03 DIAGNOSIS — Z1239 Encounter for other screening for malignant neoplasm of breast: Secondary | ICD-10-CM

## 2019-08-01 ENCOUNTER — Other Ambulatory Visit: Payer: Self-pay | Admitting: Physician Assistant

## 2019-08-01 DIAGNOSIS — L299 Pruritus, unspecified: Secondary | ICD-10-CM

## 2019-08-24 DIAGNOSIS — Z20828 Contact with and (suspected) exposure to other viral communicable diseases: Secondary | ICD-10-CM | POA: Diagnosis not present

## 2019-08-24 DIAGNOSIS — Z03818 Encounter for observation for suspected exposure to other biological agents ruled out: Secondary | ICD-10-CM | POA: Diagnosis not present

## 2019-09-11 ENCOUNTER — Encounter: Payer: Self-pay | Admitting: Physician Assistant

## 2019-09-16 NOTE — Progress Notes (Signed)
Complete physical exam   Patient: Jennifer Garrison   DOB: Aug 02, 1960   59 y.o. Female  MRN: [id number] Visit Date: 09/17/2019  Today's healthcare provider: Trinna Post, PA-C   Chief Complaint  Patient presents with  . Annual Exam   Subjective    Jennifer Garrison is a 59 y.o. female who presents today for a complete physical exam.  She reports consuming a general diet. Home exercise routine includes walking 1 hrs per day. She generally feels fairly well. She reports sleeping fairly well. She does not have additional problems to discuss today. She has intentionally been loosing weight. She also mentions that she is having hearing loss. She reports a history of a left tympanoplasty. She is interested in audiology evaluation. She has noticed a round mass on her right thumb in the past 3 months. It is painless. No weeping. She is due for a colonoscopy. She had a partial hysterectomy in 2017. She is unsure if she has her cervix.  HPI     Past Medical History:  Diagnosis Date  . Allergy   . Anxiety   . IBS (irritable bowel syndrome)   . Trigger finger    Left Middle Finger 11/2016   Past Surgical History:  Procedure Laterality Date  . ABDOMINAL HYSTERECTOMY  02/2016   Only cervix remains (done for prolapse)  . AUGMENTATION MAMMAPLASTY Bilateral 2010  . BREAST CYST ASPIRATION Left 2010   neg  . BREAST ENHANCEMENT SURGERY  1998  . CHOLECYSTECTOMY    . TYMPANOPLASTY Left    Social History   Socioeconomic History  . Marital status: Married    Spouse name: Not on file  . Number of children: 1  . Years of education: Not on file  . Highest education level: Not on file  Occupational History  . Not on file  Tobacco Use  . Smoking status: Former Smoker    Types: Cigarettes    Quit date: 09/09/1992    Years since quitting: 27.0  . Smokeless tobacco: Never Used  Vaping Use  . Vaping Use: Never used  Substance and Sexual Activity  . Alcohol use: Yes    Comment: socially   .  Drug use: No  . Sexual activity: Never  Other Topics Concern  . Not on file  Social History Narrative  . Not on file   Social Determinants of Health   Financial Resource Strain:   . Difficulty of Paying Living Expenses:   Food Insecurity:   . Worried About Charity fundraiser in the Last Year:   . Arboriculturist in the Last Year:   Transportation Needs:   . Film/video editor (Medical):   Marland Kitchen Lack of Transportation (Non-Medical):   Physical Activity:   . Days of Exercise per Week:   . Minutes of Exercise per Session:   Stress:   . Feeling of Stress :   Social Connections:   . Frequency of Communication with Friends and Family:   . Frequency of Social Gatherings with Friends and Family:   . Attends Religious Services:   . Active Member of Clubs or Organizations:   . Attends Archivist Meetings:   Marland Kitchen Marital Status:   Intimate Partner Violence:   . Fear of Current or Ex-Partner:   . Emotionally Abused:   Marland Kitchen Physically Abused:   . Sexually Abused:    Family Status  Relation Name Status  . Mother  Deceased  . Father  Deceased  .  Neg Hx  (Not Specified)   Family History  Problem Relation Age of Onset  . Lung cancer Mother   . Hypertension Mother   . Lupus Father   . Breast cancer Neg Hx    No Known Allergies  Patient Care Team: Paulene Floor as PCP - General (Physician Assistant)   Medications: Outpatient Medications Prior to Visit  Medication Sig  . buPROPion (WELLBUTRIN SR) 150 MG 12 hr tablet Take 1 tablet (150 mg total) by mouth 2 (two) times daily.  . mupirocin cream (BACTROBAN) 2 % Apply 1 application topically 2 (two) times daily.  . mupirocin ointment (BACTROBAN) 2 % Place 1 application into the nose 2 (two) times daily.  Marland Kitchen triamcinolone cream (KENALOG) 0.1 % APPLY  CREAM EXTERNALLY TWICE DAILY   No facility-administered medications prior to visit.    Review of Systems  Constitutional: Negative.   HENT: Negative.   Eyes:  Negative.   Respiratory: Negative.   Cardiovascular: Negative.   Gastrointestinal: Negative.   Endocrine: Negative.   Genitourinary: Negative.   Musculoskeletal: Positive for arthralgias.  Skin: Negative.   Allergic/Immunologic: Positive for environmental allergies.  Neurological: Negative.   Hematological: Negative.   Psychiatric/Behavioral: Negative.       Objective    BP 108/69 (BP Location: Right Arm, Patient Position: Sitting, Cuff Size: Small)   Pulse 68   Temp 98.5 F (36.9 C)   Resp 16   Ht 5' 3.5" (1.613 m)   Wt 115 lb 12.8 oz (52.5 kg)   BMI 20.19 kg/m     Physical Exam Exam conducted with a chaperone present.  Constitutional:      Appearance: Normal appearance.  Cardiovascular:     Rate and Rhythm: Normal rate and regular rhythm.     Pulses: Normal pulses.     Heart sounds: Normal heart sounds.  Pulmonary:     Effort: Pulmonary effort is normal.     Breath sounds: Normal breath sounds.  Abdominal:     General: Bowel sounds are normal.     Tenderness: There is no abdominal tenderness.  Genitourinary:    General: Normal vulva.     Vagina: Normal.     Cervix: Normal.     Uterus: Absent.   Musculoskeletal:     Right hand: Deformity present.     Comments: Small discrete mass on right thumb.   Skin:    General: Skin is warm and dry.  Neurological:     General: No focal deficit present.     Mental Status: She is alert and oriented to person, place, and time.  Psychiatric:        Mood and Affect: Mood normal.        Behavior: Behavior normal.         Depression Screen  PHQ 2/9 Scores 09/18/2019 09/17/2019 09/05/2018  PHQ - 2 Score 0 0 2  PHQ- 9 Score 0 - 6    Results for orders placed or performed in visit on 09/17/19  CBC with Differential/Platelet  Result Value Ref Range   WBC 6.5 3.4 - 10.8 x10E3/uL   RBC 5.03 3.77 - 5.28 x10E6/uL   Hemoglobin 14.9 11.1 - 15.9 g/dL   Hematocrit 43.8 34.0 - 46.6 %   MCV 87 79 - 97 fL   MCH 29.6 26.6 - 33.0  pg   MCHC 34.0 31 - 35 g/dL   RDW 11.9 11.7 - 15.4 %   Platelets 250 150 - 450 x10E3/uL   Neutrophils  57 Not Estab. %   Lymphs 33 Not Estab. %   Monocytes 6 Not Estab. %   Eos 3 Not Estab. %   Basos 1 Not Estab. %   Neutrophils Absolute 3.7 1 - 7 x10E3/uL   Lymphocytes Absolute 2.1 0 - 3 x10E3/uL   Monocytes Absolute 0.4 0 - 0 x10E3/uL   EOS (ABSOLUTE) 0.2 0.0 - 0.4 x10E3/uL   Basophils Absolute 0.1 0 - 0 x10E3/uL   Immature Granulocytes 0 Not Estab. %   Immature Grans (Abs) 0.0 0.0 - 0.1 x10E3/uL  Comprehensive metabolic panel  Result Value Ref Range   Glucose 90 65 - 99 mg/dL   BUN 16 8 - 27 mg/dL   Creatinine, Ser 0.89 0.57 - 1.00 mg/dL   GFR calc non Af Amer 71 >59 mL/min/1.73   GFR calc Af Amer 81 >59 mL/min/1.73   BUN/Creatinine Ratio 18 12 - 28   Sodium 138 134 - 144 mmol/L   Potassium 4.3 3.5 - 5.2 mmol/L   Chloride 102 96 - 106 mmol/L   CO2 24 20 - 29 mmol/L   Calcium 10.0 8.7 - 10.3 mg/dL   Total Protein 7.0 6.0 - 8.5 g/dL   Albumin 4.6 3.8 - 4.9 g/dL   Globulin, Total 2.4 1.5 - 4.5 g/dL   Albumin/Globulin Ratio 1.9 1.2 - 2.2   Bilirubin Total 0.4 0.0 - 1.2 mg/dL   Alkaline Phosphatase 76 48 - 121 IU/L   AST 15 0 - 40 IU/L   ALT 14 0 - 32 IU/L  Lipid panel  Result Value Ref Range   Cholesterol, Total 165 100 - 199 mg/dL   Triglycerides 84 0 - 149 mg/dL   HDL 51 >39 mg/dL   VLDL Cholesterol Cal 16 5 - 40 mg/dL   LDL Chol Calc (NIH) 98 0 - 99 mg/dL   Chol/HDL Ratio 3.2 0.0 - 4.4 ratio  TSH  Result Value Ref Range   TSH 1.490 0.450 - 4.500 uIU/mL    Assessment & Plan    1. Annual physical exam - CBC with Differential/Platelet - Comprehensive metabolic panel - Lipid panel - TSH  2. Colon cancer screening - Ambulatory referral to Gastroenterology  3. Cyst of joint of hand, right - Ambulatory referral to Orthopedic Surgery  4. Bilateral hearing loss, unspecified hearing loss type  - Ambulatory referral to ENT  5. Cervical cancer  screening Patient does have a cervix and needs cervical cancer screening until the age of 24.  - Cytology - PAP  6. Major depressive disorder with single episode, in partial remission ([facility]) Continue Wellbutrin.    Routine Health Maintenance and Physical Exam  Exercise Activities and Dietary recommendations Goals   None     Immunization History  Administered Date(s) Administered  . Influenza,inj,Quad PF,6+ Mos 01/09/2018  . Influenza-Unspecified 12/26/2016  . Tdap 09/05/2018    Health Maintenance  Topic Date Due  . COVID-19 Vaccine (1) Never done  . COLONOSCOPY  Never done  . PAP SMEAR-Modifier  05/13/2019  . INFLUENZA VACCINE  10/27/2019  . MAMMOGRAM  07/02/2020  . TETANUS/TDAP  09/04/2028  . Hepatitis C Screening  Completed  . HIV Screening  Completed    Discussed health benefits of physical activity, and encouraged her to engage in regular exercise appropriate for her age and condition.    Return in about 1 year (around 09/16/2020) for CPE.     ITrinna Post, PA-C, have reviewed all documentation for this visit. The documentation on 09/18/19  for the exam, diagnosis, procedures, and orders are all accurate and complete.    Paulene Floor  [facility] [phone] (phone) [phone] (fax)  [place]

## 2019-09-17 ENCOUNTER — Other Ambulatory Visit (HOSPITAL_COMMUNITY)
Admission: RE | Admit: 2019-09-17 | Discharge: 2019-09-17 | Disposition: A | Discharge status: Home / Self Care | Payer: BC Managed Care – PPO | Source: Ambulatory Visit | Attending: Physician Assistant | Admitting: Physician Assistant

## 2019-09-17 ENCOUNTER — Encounter: Payer: Self-pay | Admitting: Physician Assistant

## 2019-09-17 ENCOUNTER — Other Ambulatory Visit: Payer: Self-pay

## 2019-09-17 ENCOUNTER — Ambulatory Visit (INDEPENDENT_AMBULATORY_CARE_PROVIDER_SITE_OTHER): Payer: BC Managed Care – PPO | Admitting: Physician Assistant

## 2019-09-17 VITALS — BP 108/69 | HR 68 | Temp 98.5°F | Resp 16 | Ht 63.5 in | Wt 115.8 lb

## 2019-09-17 DIAGNOSIS — Z124 Encounter for screening for malignant neoplasm of cervix: Secondary | ICD-10-CM

## 2019-09-17 DIAGNOSIS — H9193 Unspecified hearing loss, bilateral: Secondary | ICD-10-CM

## 2019-09-17 DIAGNOSIS — M25841 Other specified joint disorders, right hand: Secondary | ICD-10-CM

## 2019-09-17 DIAGNOSIS — Z1211 Encounter for screening for malignant neoplasm of colon: Secondary | ICD-10-CM | POA: Diagnosis not present

## 2019-09-17 DIAGNOSIS — Z0000 Encounter for general adult medical examination without abnormal findings: Secondary | ICD-10-CM

## 2019-09-17 DIAGNOSIS — F324 Major depressive disorder, single episode, in partial remission: Secondary | ICD-10-CM

## 2019-09-18 LAB — COMPREHENSIVE METABOLIC PANEL
ALT: 14 IU/L (ref 0–32)
AST: 15 IU/L (ref 0–40)
Albumin/Globulin Ratio: 1.9 (ref 1.2–2.2)
Albumin: 4.6 g/dL (ref 3.8–4.9)
Alkaline Phosphatase: 76 IU/L (ref 48–121)
BUN/Creatinine Ratio: 18 (ref 12–28)
BUN: 16 mg/dL (ref 8–27)
Bilirubin Total: 0.4 mg/dL (ref 0.0–1.2)
CO2: 24 mmol/L (ref 20–29)
Calcium: 10 mg/dL (ref 8.7–10.3)
Chloride: 102 mmol/L (ref 96–106)
Creatinine, Ser: 0.89 mg/dL (ref 0.57–1.00)
GFR calc Af Amer: 81 mL/min/{1.73_m2} (ref >59)
GFR calc non Af Amer: 71 mL/min/{1.73_m2} (ref >59)
Globulin, Total: 2.4 g/dL (ref 1.5–4.5)
Glucose: 90 mg/dL (ref 65–99)
Potassium: 4.3 mmol/L (ref 3.5–5.2)
Sodium: 138 mmol/L (ref 134–144)
Total Protein: 7 g/dL (ref 6.0–8.5)

## 2019-09-18 LAB — LIPID PANEL
Chol/HDL Ratio: 3.2 ratio (ref 0.0–4.4)
Cholesterol, Total: 165 mg/dL (ref 100–199)
HDL: 51 mg/dL (ref >39)
LDL Chol Calc (NIH): 98 mg/dL (ref 0–99)
Triglycerides: 84 mg/dL (ref 0–149)
VLDL Cholesterol Cal: 16 mg/dL (ref 5–40)

## 2019-09-18 LAB — CBC WITH DIFFERENTIAL/PLATELET
Basophils Absolute: 0.1 10*3/uL (ref 0.0–0.2)
Basos: 1 %
EOS (ABSOLUTE): 0.2 10*3/uL (ref 0.0–0.4)
Eos: 3 %
Hematocrit: 43.8 % (ref 34.0–46.6)
Hemoglobin: 14.9 g/dL (ref 11.1–15.9)
Immature Grans (Abs): 0 10*3/uL (ref 0.0–0.1)
Immature Granulocytes: 0 %
Lymphocytes Absolute: 2.1 10*3/uL (ref 0.7–3.1)
Lymphs: 33 %
MCH: 29.6 pg (ref 26.6–33.0)
MCHC: 34 g/dL (ref 31.5–35.7)
MCV: 87 fL (ref 79–97)
Monocytes Absolute: 0.4 10*3/uL (ref 0.1–0.9)
Monocytes: 6 %
Neutrophils Absolute: 3.7 10*3/uL (ref 1.4–7.0)
Neutrophils: 57 %
Platelets: 250 10*3/uL (ref 150–450)
RBC: 5.03 x10E6/uL (ref 3.77–5.28)
RDW: 11.9 % (ref 11.7–15.4)
WBC: 6.5 10*3/uL (ref 3.4–10.8)

## 2019-09-18 LAB — TSH: TSH: 1.49 u[IU]/mL (ref 0.450–4.500)

## 2019-09-20 LAB — CYTOLOGY - PAP
Comment: NEGATIVE
Diagnosis: NEGATIVE
High risk HPV: NEGATIVE

## 2019-09-23 ENCOUNTER — Other Ambulatory Visit: Payer: Self-pay

## 2019-09-23 ENCOUNTER — Telehealth (INDEPENDENT_AMBULATORY_CARE_PROVIDER_SITE_OTHER): Payer: Self-pay | Admitting: Gastroenterology

## 2019-09-23 DIAGNOSIS — Z1211 Encounter for screening for malignant neoplasm of colon: Secondary | ICD-10-CM

## 2019-09-23 NOTE — Progress Notes (Signed)
Gastroenterology Pre-Procedure Review  Request Date: 10/10/19 Requesting Physician: Dr. Vicente Males  PATIENT REVIEW QUESTIONS: The patient responded to the following health history questions as indicated:    1. Are you having any GI issues? yes (history of IBS) 2. Do you have a personal history of Polyps? no 3. Do you have a family history of Colon Cancer or Polyps? no 4. Diabetes Mellitus? no 5. Joint replacements in the past 12 months?no 6. Major health problems in the past 3 months?no 7. Any artificial heart valves, MVP, or defibrillator?no    MEDICATIONS & ALLERGIES:    Patient reports the following regarding taking any anticoagulation/antiplatelet therapy:   Plavix, Coumadin, Eliquis, Xarelto, Lovenox, Pradaxa, Brilinta, or Effient? no Aspirin? no  Patient confirms/reports the following medications:  Current Outpatient Medications  Medication Sig Dispense Refill   buPROPion (WELLBUTRIN SR) 150 MG 12 hr tablet Take 1 tablet (150 mg total) by mouth 2 (two) times daily. 180 tablet 3   mupirocin cream (BACTROBAN) 2 % Apply 1 application topically 2 (two) times daily. 15 g 0   mupirocin ointment (BACTROBAN) 2 % Place 1 application into the nose 2 (two) times daily. 22 g 0   triamcinolone cream (KENALOG) 0.1 % APPLY  CREAM EXTERNALLY TWICE DAILY 30 g 0   No current facility-administered medications for this visit.    Patient confirms/reports the following allergies:  No Known Allergies  No orders of the defined types were placed in this encounter.   AUTHORIZATION INFORMATION Primary Insurance: 1D#: Group #:  Secondary Insurance: 1D#: Group #:  SCHEDULE INFORMATION: Date: 10/10/19 Time: Location:[facility]

## 2019-09-24 DIAGNOSIS — M71341 Other bursal cyst, right hand: Secondary | ICD-10-CM | POA: Diagnosis not present

## 2019-10-07 DIAGNOSIS — H90A31 Mixed conductive and sensorineural hearing loss, unilateral, right ear with restricted hearing on the contralateral side: Secondary | ICD-10-CM | POA: Diagnosis not present

## 2019-10-07 DIAGNOSIS — H6981 Other specified disorders of Eustachian tube, right ear: Secondary | ICD-10-CM | POA: Diagnosis not present

## 2019-10-07 DIAGNOSIS — H6123 Impacted cerumen, bilateral: Secondary | ICD-10-CM | POA: Diagnosis not present

## 2019-10-08 ENCOUNTER — Other Ambulatory Visit
Admission: RE | Admit: 2019-10-08 | Discharge: 2019-10-08 | Disposition: A | Discharge status: Home / Self Care | Payer: BC Managed Care – PPO | Source: Ambulatory Visit | Attending: Gastroenterology | Admitting: Gastroenterology

## 2019-10-08 ENCOUNTER — Other Ambulatory Visit: Payer: Self-pay

## 2019-10-08 DIAGNOSIS — Z20822 Contact with and (suspected) exposure to covid-19: Secondary | ICD-10-CM | POA: Insufficient documentation

## 2019-10-08 LAB — SARS CORONAVIRUS 2 (TAT 6-24 HRS): SARS Coronavirus 2: NEGATIVE

## 2019-10-10 ENCOUNTER — Ambulatory Visit: Payer: BC Managed Care – PPO | Admitting: Certified Registered Nurse Anesthetist

## 2019-10-10 ENCOUNTER — Encounter: Payer: Self-pay | Admitting: Gastroenterology

## 2019-10-10 ENCOUNTER — Ambulatory Visit
Admission: RE | Admit: 2019-10-10 | Discharge: 2019-10-10 | Disposition: A | Discharge status: Home / Self Care | Payer: BC Managed Care – PPO | Attending: Gastroenterology | Admitting: Gastroenterology

## 2019-10-10 ENCOUNTER — Encounter
Admission: RE | Disposition: A | Discharge status: Home / Self Care | Payer: Self-pay | Source: Home / Self Care | Attending: Gastroenterology

## 2019-10-10 DIAGNOSIS — Z1211 Encounter for screening for malignant neoplasm of colon: Secondary | ICD-10-CM | POA: Diagnosis not present

## 2019-10-10 DIAGNOSIS — Z87891 Personal history of nicotine dependence: Secondary | ICD-10-CM | POA: Insufficient documentation

## 2019-10-10 DIAGNOSIS — F419 Anxiety disorder, unspecified: Secondary | ICD-10-CM | POA: Diagnosis not present

## 2019-10-10 DIAGNOSIS — Z79899 Other long term (current) drug therapy: Secondary | ICD-10-CM | POA: Insufficient documentation

## 2019-10-10 DIAGNOSIS — Q438 Other specified congenital malformations of intestine: Secondary | ICD-10-CM | POA: Insufficient documentation

## 2019-10-10 DIAGNOSIS — K635 Polyp of colon: Secondary | ICD-10-CM | POA: Diagnosis not present

## 2019-10-10 DIAGNOSIS — K5730 Diverticulosis of large intestine without perforation or abscess without bleeding: Secondary | ICD-10-CM | POA: Diagnosis not present

## 2019-10-10 DIAGNOSIS — D125 Benign neoplasm of sigmoid colon: Secondary | ICD-10-CM | POA: Diagnosis not present

## 2019-10-10 DIAGNOSIS — K5790 Diverticulosis of intestine, part unspecified, without perforation or abscess without bleeding: Secondary | ICD-10-CM | POA: Diagnosis not present

## 2019-10-10 HISTORY — PX: COLONOSCOPY WITH PROPOFOL: SHX5780

## 2019-10-10 SURGERY — COLONOSCOPY WITH PROPOFOL
Anesthesia: General

## 2019-10-10 MED ORDER — PROPOFOL 500 MG/50ML IV EMUL
INTRAVENOUS | Status: AC
  Filled 2019-10-10: qty 50

## 2019-10-10 MED ORDER — PROPOFOL 10 MG/ML IV BOLUS
INTRAVENOUS | Status: DC | PRN
  Administered 2019-10-10: 60 mg via INTRAVENOUS
  Administered 2019-10-10: 20 mg via INTRAVENOUS

## 2019-10-10 MED ORDER — SODIUM CHLORIDE 0.9 % IV SOLN
INTRAVENOUS | Status: DC
  Administered 2019-10-10: 1000 mL via INTRAVENOUS

## 2019-10-10 MED ORDER — LIDOCAINE HCL (CARDIAC) PF 100 MG/5ML IV SOSY
PREFILLED_SYRINGE | INTRAVENOUS | Status: DC | PRN
  Administered 2019-10-10: 50 mg via INTRAVENOUS

## 2019-10-10 MED ORDER — PROPOFOL 500 MG/50ML IV EMUL
INTRAVENOUS | Status: DC | PRN
  Administered 2019-10-10: 150 ug/kg/min via INTRAVENOUS

## 2019-10-10 NOTE — Anesthesia Procedure Notes (Signed)
Date/Time: 10/10/2019 8:55 AM Performed by: Johnna Acosta, CRNA Pre-anesthesia Checklist: Patient identified, Emergency Drugs available, Suction available, Patient being monitored and Timeout performed Patient Re-evaluated:Patient Re-evaluated prior to induction Oxygen Delivery Method: Nasal cannula Preoxygenation: Pre-oxygenation with 100% oxygen Induction Type: IV induction

## 2019-10-10 NOTE — H&P (Signed)
Jonathon Bellows, MD [address] [place], [place], [place], [address] [address], [place], [place], [place], [address] Phone: [phone]  Fax: [phone]  Primary Care Physician:  Trinna Post, PA-C   Pre-Procedure History & Physical: HPI:  Jennifer Garrison is a 59 y.o. female is here for an colonoscopy.   Past Medical History:  Diagnosis Date  . Allergy   . Anxiety   . IBS (irritable bowel syndrome)   . Trigger finger    Left Middle Finger 11/2016    Past Surgical History:  Procedure Laterality Date  . ABDOMINAL HYSTERECTOMY  02/2016   Only cervix remains (done for prolapse)  . AUGMENTATION MAMMAPLASTY Bilateral 2010  . BREAST CYST ASPIRATION Left 2010   neg  . BREAST ENHANCEMENT SURGERY  1998  . CHOLECYSTECTOMY    . TYMPANOPLASTY Left     Prior to Admission medications   Medication Sig Start Date End Date Taking? Authorizing Provider  buPROPion (WELLBUTRIN SR) 150 MG 12 hr tablet Take 1 tablet (150 mg total) by mouth 2 (two) times daily. 06/13/19   Trinna Post, PA-C  mupirocin cream (BACTROBAN) 2 % Apply 1 application topically 2 (two) times daily. Patient not taking: Reported on 09/23/2019 01/09/19   Trinna Post, PA-C  mupirocin ointment (BACTROBAN) 2 % Place 1 application into the nose 2 (two) times daily. Patient not taking: Reported on 09/23/2019 01/10/19   Trinna Post, PA-C  triamcinolone cream (KENALOG) 0.1 % APPLY  CREAM EXTERNALLY TWICE DAILY 08/01/19   Trinna Post, PA-C    Allergies as of 09/23/2019  . (No Known Allergies)    Family History  Problem Relation Age of Onset  . Lung cancer Mother   . Hypertension Mother   . Lupus Father   . Breast cancer Neg Hx     Social History   Socioeconomic History  . Marital status: Married    Spouse name: Not on file  . Number of children: 1  . Years of education: Not on file  . Highest education level: Not on file  Occupational History  . Not on file  Tobacco Use  .  Smoking status: Former Smoker    Types: Cigarettes    Quit date: 09/09/1992    Years since quitting: 27.1  . Smokeless tobacco: Never Used  Vaping Use  . Vaping Use: Never used  Substance and Sexual Activity  . Alcohol use: Yes    Comment: socially   . Drug use: No  . Sexual activity: Never  Other Topics Concern  . Not on file  Social History Narrative  . Not on file   Social Determinants of Health   Financial Resource Strain:   . Difficulty of Paying Living Expenses:   Food Insecurity:   . Worried About Charity fundraiser in the Last Year:   . Arboriculturist in the Last Year:   Transportation Needs:   . Film/video editor (Medical):   Marland Kitchen Lack of Transportation (Non-Medical):   Physical Activity:   . Days of Exercise per Week:   . Minutes of Exercise per Session:   Stress:   . Feeling of Stress :   Social Connections:   . Frequency of Communication with Friends and Family:   . Frequency of Social Gatherings with Friends and Family:   . Attends Religious Services:   . Active Member of Clubs or Organizations:   . Attends Archivist Meetings:   .  Marital Status:   Intimate Partner Violence:   . Fear of Current or Ex-Partner:   . Emotionally Abused:   Marland Kitchen Physically Abused:   . Sexually Abused:     Review of Systems: See HPI, otherwise negative ROS  Physical Exam: BP 122/74   Pulse 92   Temp (!) 97.2 F (36.2 C) (Tympanic)   Resp 16   Ht 5\' 4"  (1.626 m)   Wt 52.2 kg   SpO2 100%   BMI 19.74 kg/m  General:   Alert,  pleasant and cooperative in NAD Head:  Normocephalic and atraumatic. Neck:  Supple; no masses or thyromegaly. Lungs:  Clear throughout to auscultation, normal respiratory effort.    Heart:  +S1, +S2, Regular rate and rhythm, No edema. Abdomen:  Soft, nontender and nondistended. Normal bowel sounds, without guarding, and without rebound.   Neurologic:  Alert and  oriented x4;  grossly normal neurologically.  Impression/Plan: Jennifer Garrison is here for an colonoscopy to be performed for Screening colonoscopy average risk   Risks, benefits, limitations, and alternatives regarding  colonoscopy have been reviewed with the patient.  Questions have been answered.  All parties agreeable.   Jonathon Bellows, MD  10/10/2019, 8:43 AM

## 2019-10-10 NOTE — Anesthesia Postprocedure Evaluation (Signed)
Anesthesia Post Note  Patient: Retail banker  Procedure(s) Performed: COLONOSCOPY WITH PROPOFOL (N/A )  Patient location during evaluation: Endoscopy Anesthesia Type: General Level of consciousness: awake and alert Pain management: pain level controlled Vital Signs Assessment: post-procedure vital signs reviewed and stable Respiratory status: spontaneous breathing, nonlabored ventilation, respiratory function stable and patient connected to nasal cannula oxygen Cardiovascular status: blood pressure returned to baseline and stable Postop Assessment: no apparent nausea or vomiting Anesthetic complications: no   No complications documented.   Last Vitals:  Vitals:   10/10/19 0931 10/10/19 0939  BP: 97/74 108/80  Pulse: 75 71  Resp: 17 14  Temp:    SpO2: 100% 99%    Last Pain:  Vitals:   10/10/19 0949  TempSrc:   PainSc: 0-No pain                 Martha Clan

## 2019-10-10 NOTE — Op Note (Signed)
[facility] Gastroenterology Patient Name: Jennifer Garrison Procedure Date: 10/10/2019 8:39 AM MRN: [id number] Account #: [id number] Date of Birth: 01-Mar-1961 Admit Type: Outpatient Age: 59 Room: [facility] ENDO ROOM 1 Gender: Female Note Status: Finalized Procedure:             Colonoscopy Indications:           Screening for colorectal malignant neoplasm Providers:             Jonathon Bellows MD, MD Referring MD:          Wendee Beavers. Terrilee Croak (Referring MD) Medicines:             Monitored Anesthesia Care Complications:         No immediate complications. Procedure:             Pre-Anesthesia Assessment:                        - Prior to the procedure, a History and Physical was                         performed, and patient medications, allergies and                         sensitivities were reviewed. The patient's tolerance                         of previous anesthesia was reviewed.                        - The risks and benefits of the procedure and the                         sedation options and risks were discussed with the                         patient. All questions were answered and informed                         consent was obtained.                        - ASA Grade Assessment: II - A patient with mild                         systemic disease.                        After obtaining informed consent, the colonoscope was                         passed under direct vision. Throughout the procedure,                         the patient's blood pressure, pulse, and oxygen                         saturations were monitored continuously. The                         Colonoscope was introduced through the anus and  advanced to the the cecum, identified by the                         appendiceal orifice. The patient tolerated the                         procedure well. The quality of the bowel preparation                         was excellent. The  colonoscopy was somewhat difficult                         due to significant looping. Successful completion of                         the procedure was aided by withdrawing the scope and                         replacing with the pediatric colonoscope. Findings:      The perianal and digital rectal examinations were normal.      A few small-mouthed diverticula were found in the sigmoid colon.      The exam was otherwise without abnormality on direct and retroflexion       views.      A 3 mm polyp was found in the sigmoid colon. The polyp was sessile. The       polyp was removed with a cold biopsy forceps. Resection and retrieval       were complete. Impression:            - Diverticulosis in the sigmoid colon.                        - The examination was otherwise normal on direct and                         retroflexion views.                        - No specimens collected. Recommendation:        - Discharge patient to home (with escort).                        - Resume previous diet.                        - Continue present medications.                        - Repeat colonoscopy for surveillance based on                         pathology results. Procedure Code(s):     --- Professional ---                        [phone], Colonoscopy, flexible; with biopsy, single or                         multiple Diagnosis Code(s):     --- Professional ---  Z12.11, Encounter for screening for malignant neoplasm                         of colon                        K57.30, Diverticulosis of large intestine without                         perforation or abscess without bleeding CPT copyright 2019 American Medical Association. All rights reserved. The codes documented in this report are preliminary and upon coder review may  be revised to meet current compliance requirements. Jonathon Bellows, MD Jonathon Bellows MD, MD 10/10/2019 9:27:25 AM This report has been signed  electronically. Number of Addenda: 0 Note Initiated On: 10/10/2019 8:39 AM Scope Withdrawal Time: 0 hours 14 minutes 28 seconds  Total Procedure Duration: 0 hours 24 minutes 19 seconds  Estimated Blood Loss:  Estimated blood loss: none.      [facility]

## 2019-10-10 NOTE — Anesthesia Preprocedure Evaluation (Signed)
Anesthesia Evaluation  Patient identified by MRN, date of birth, ID band Patient awake    Reviewed: Allergy & Precautions, H&P , NPO status , Patient's Chart, lab work & pertinent test results, reviewed documented beta blocker date and time   History of Anesthesia Complications Negative for: history of anesthetic complications  Airway Mallampati: I  TM Distance: >3 FB Neck ROM: full    Dental  (+) Dental Advidsory Given, Teeth Intact   Pulmonary neg pulmonary ROS, former smoker,    Pulmonary exam normal breath sounds clear to auscultation       Cardiovascular Exercise Tolerance: Good negative cardio ROS Normal cardiovascular exam Rhythm:regular Rate:Normal     Neuro/Psych PSYCHIATRIC DISORDERS Anxiety Depression negative neurological ROS     GI/Hepatic negative GI ROS, Neg liver ROS,   Endo/Other  negative endocrine ROS  Renal/GU negative Renal ROS  negative genitourinary   Musculoskeletal   Abdominal   Peds  Hematology negative hematology ROS (+)   Anesthesia Other Findings Past Medical History: No date: Allergy No date: Anxiety No date: IBS (irritable bowel syndrome) No date: Trigger finger     Comment:  Left Middle Finger 11/2016   Reproductive/Obstetrics negative OB ROS                             Anesthesia Physical Anesthesia Plan  ASA: II  Anesthesia Plan: General   Post-op Pain Management:    Induction: Intravenous  PONV Risk Score and Plan: 3 and Propofol infusion and TIVA  Airway Management Planned: Natural Airway and Nasal Cannula  Additional Equipment:   Intra-op Plan:   Post-operative Plan: Extubation in OR  Informed Consent: I have reviewed the patients History and Physical, chart, labs and discussed the procedure including the risks, benefits and alternatives for the proposed anesthesia with the patient or authorized representative who has indicated  his/her understanding and acceptance.     Dental Advisory Given  Plan Discussed with: Anesthesiologist, CRNA and Surgeon  Anesthesia Plan Comments:         Anesthesia Quick Evaluation

## 2019-10-10 NOTE — Transfer of Care (Signed)
Immediate Anesthesia Transfer of Care Note  Patient: Jennifer Garrison  Procedure(s) Performed: COLONOSCOPY WITH PROPOFOL (N/A )  Patient Location: PACU  Anesthesia Type:General  Level of Consciousness: sedated  Airway & Oxygen Therapy: Patient Spontanous Breathing and Patient connected to nasal cannula oxygen  Post-op Assessment: Report given to RN and Post -op Vital signs reviewed and stable  Post vital signs: Reviewed and stable  Last Vitals:  Vitals Value Taken Time  BP 97/74 10/10/19 0931  Temp 35.8 C 10/10/19 0929  Pulse 75 10/10/19 0931  Resp 17 10/10/19 0931  SpO2 100 % 10/10/19 0931    Last Pain:  Vitals:   10/10/19 0929  TempSrc: Temporal  PainSc:          Complications: No complications documented.

## 2019-10-11 ENCOUNTER — Encounter: Payer: Self-pay | Admitting: Gastroenterology

## 2019-10-11 LAB — SURGICAL PATHOLOGY

## 2019-10-15 ENCOUNTER — Encounter: Payer: Self-pay | Admitting: Gastroenterology

## 2020-09-17 ENCOUNTER — Encounter: Payer: Self-pay | Admitting: Physician Assistant

## 2020-09-30 ENCOUNTER — Encounter: Payer: Self-pay | Admitting: Physician Assistant

## 2020-09-30 ENCOUNTER — Telehealth: Payer: Self-pay

## 2020-09-30 NOTE — Telephone Encounter (Signed)
Copied from [place] [phone]. Topic: General - Other >> Sep 30, 2020  3:25 PM Leward Quan A wrote: Reason for CRM: Freda Munro with [facility] called in to check on a medical record request that was sent in June of 2022. Please call Freda Munro Ph# [phone] ext# [phone]

## 2020-10-01 NOTE — Telephone Encounter (Signed)
Called Freda Munro with [facility]. LVM advising we haven't received request and to resend request to FAX# [phone].

## 2020-10-08 ENCOUNTER — Ambulatory Visit: Payer: Managed Care, Other (non HMO) | Admitting: Internal Medicine

## 2020-10-08 ENCOUNTER — Other Ambulatory Visit: Payer: Self-pay

## 2020-10-08 ENCOUNTER — Encounter: Payer: Self-pay | Admitting: Internal Medicine

## 2020-10-08 VITALS — BP 106/55 | HR 69 | Temp 97.3°F | Resp 18 | Ht 64.0 in | Wt 113.8 lb

## 2020-10-08 DIAGNOSIS — Z8616 Personal history of COVID-19: Secondary | ICD-10-CM

## 2020-10-08 DIAGNOSIS — F32A Depression, unspecified: Secondary | ICD-10-CM

## 2020-10-08 DIAGNOSIS — L299 Pruritus, unspecified: Secondary | ICD-10-CM

## 2020-10-08 DIAGNOSIS — K582 Mixed irritable bowel syndrome: Secondary | ICD-10-CM | POA: Diagnosis not present

## 2020-10-08 DIAGNOSIS — Z1231 Encounter for screening mammogram for malignant neoplasm of breast: Secondary | ICD-10-CM

## 2020-10-08 DIAGNOSIS — F419 Anxiety disorder, unspecified: Secondary | ICD-10-CM | POA: Diagnosis not present

## 2020-10-08 DIAGNOSIS — R0981 Nasal congestion: Secondary | ICD-10-CM

## 2020-10-08 MED ORDER — BUPROPION HCL ER (SR) 150 MG PO TB12: 150.0000 mg | ORAL_TABLET | Freq: Two times a day (BID) | ORAL | 1 refills | Status: DC

## 2020-10-08 NOTE — Patient Instructions (Signed)
Pruritus Pruritus is an itchy feeling on the skin. One of the most common causes is dry skin, but many different things can cause itching. Most cases of itching do notrequire medical attention. Sometimes itchy skin can turn into a rash. Follow these instructions at home: Skin care  Apply moisturizing lotion to your skin as needed. Lotion that contains petroleum jelly is best. Take medicines or apply medicated creams only as told by your health care provider. This may include: Corticosteroid cream. Anti-itch lotions. Oral antihistamines. Apply a cool, wet cloth (cool compress) to the affected areas. Take baths with one of the following: Epsom salts. You can get these at your local pharmacy or grocery store. Follow the instructions on the packaging. Baking soda. Pour a small amount into the bath as told by your health care provider. Colloidal oatmeal. You can get this at your local pharmacy or grocery store. Follow the instructions on the packaging. Apply baking soda paste to your skin. To make the paste, stir water into a small amount of baking soda until it reaches a paste-like consistency. Do not scratch your skin. Do not take hot showers or baths, which can make itching worse. A cool shower may help with itching as long as you apply moisturizing lotion after the shower. Do not use scented soaps, detergents, perfumes, and cosmetic products. Instead, use gentle, unscented versions of these items.  General instructions Avoid wearing tight clothes. Keep a journal to help find out what is causing your itching. Write down: What you eat and drink. What cosmetic products you use. What soaps or detergents you use. What you wear, including jewelry. Use a humidifier. This keeps the air moist, which helps to prevent dry skin. Be aware of any changes in your itchiness. Contact a health care provider if: The itching does not go away after several days. You are unusually thirsty or urinating more  than normal. Your skin tingles or feels numb. Your skin or the white parts of your eyes turn yellow (jaundice). You feel weak. You have any of the following: Night sweats. Tiredness (fatigue). Weight loss. Abdominal pain. Summary Pruritus is an itchy feeling on the skin. One of the most common causes is dry skin, but many different conditions and factors can cause itching. Apply moisturizing lotion to your skin as needed. Lotion that contains petroleum jelly is best. Take medicines or apply medicated creams only as told by your health care provider. Do not take hot showers or baths. Do not use scented soaps, detergents, perfumes, or cosmetic products. This information is not intended to replace advice given to you by your health care provider. Make sure you discuss any questions you have with your healthcare provider. Document Revised: 03/28/2017 Document Reviewed: 03/28/2017 Elsevier Patient Education  2022 Elsevier Inc.  

## 2020-10-08 NOTE — Progress Notes (Signed)
HPI  Patient presents the clinic today to establish care and for management of the conditions listed below.  She is transferring care from [place], [place].  Anxiety and depression: Persistent, currently not taking Wellbutrin but she would like to get restarted on this.  She is not currently seeing a therapist.  She denies SI/HI.  IBS: Alternating constipation and diarrhea.  This is diet controlled.  She is not currently following with GI.  Pt reports persistent nasal congestion since getting Covid 09/16/20. She is not currently taking anything OTC  for this.  She also reports a persistent itchy area to her left shoulder blade. She has not noticed a rash. She has not put anything on this.   She would also like me to order her mammogram today. Past Medical History:  Diagnosis Date   Allergy    Anxiety    IBS (irritable bowel syndrome)    Trigger finger    Left Middle Finger 11/2016    Current Outpatient Medications  Medication Sig Dispense Refill   buPROPion (WELLBUTRIN SR) 150 MG 12 hr tablet Take 1 tablet (150 mg total) by mouth 2 (two) times daily. 180 tablet 3   triamcinolone cream (KENALOG) 0.1 % APPLY  CREAM EXTERNALLY TWICE DAILY 30 g 0   No current facility-administered medications for this visit.    No Known Allergies  Family History  Problem Relation Age of Onset   Lung cancer Mother    Hypertension Mother    Lupus Father    Breast cancer Neg Hx     Social History   Socioeconomic History   Marital status: Married    Spouse name: Not on file   Number of children: 1   Years of education: Not on file   Highest education level: Not on file  Occupational History   Not on file  Tobacco Use   Smoking status: Former    Types: Cigarettes    Quit date: 09/09/1992    Years since quitting: 28.0   Smokeless tobacco: Never  Vaping Use   Vaping Use: Never used  Substance and Sexual Activity   Alcohol use: Yes    Comment: socially    Drug use: No   Sexual  activity: Never  Other Topics Concern   Not on file  Social History Narrative   Not on file   Social Determinants of Health   Financial Resource Strain: Not on file  Food Insecurity: Not on file  Transportation Needs: Not on file  Physical Activity: Not on file  Stress: Not on file  Social Connections: Not on file  Intimate Partner Violence: Not on file    ROS:  Constitutional: Denies fever, malaise, fatigue, headache or abrupt weight changes.  HEENT: Pt reports nasal congestion. Denies eye pain, eye redness, ear pain, ringing in the ears, wax buildup, runny nose, bloody nose, or sore throat. Respiratory: Denies difficulty breathing, shortness of breath, cough or sputum production.   Cardiovascular: Denies chest pain, chest tightness, palpitations or swelling in the hands or feet.  Gastrointestinal: Patient reports intermittent constipation and diarrhea.  Denies abdominal pain, bloating, or blood in the stool.  GU: Denies frequency, urgency, pain with urination, blood in urine, odor or discharge. Musculoskeletal: Denies decrease in range of motion, difficulty with gait, muscle pain or joint pain and swelling.  Skin: Pt reports itchy spot of left upper back. Denies redness, rashes, lesions or ulcercations.  Neurological: Denies dizziness, difficulty with memory, difficulty with speech or problems with balance and  coordination.  Psych: Patient has a history of anxiety.  Denies depression, SI/HI.  No other specific complaints in a complete review of systems (except as listed in HPI above).  PE:  BP (!) 106/55 (BP Location: Left Arm, Patient Position: Sitting, Cuff Size: Normal)   Pulse 69   Temp (!) 97.3 F (36.3 C) (Temporal)   Resp 18   Ht 5\' 4"  (1.626 m)   Wt 113 lb 12.8 oz (51.6 kg)   SpO2 100%   BMI 19.53 kg/m   Wt Readings from Last 3 Encounters:  10/10/19 115 lb (52.2 kg)  09/17/19 115 lb 12.8 oz (52.5 kg)  06/13/19 125 lb 3.2 oz (56.8 kg)    General: Appears  her stated age, well developed, well nourished in NAD. Skin: Dry and intact. No noted lesion of the left upper back.  HEENT: Head: normal shape and size; Nose: Turbinates swollen, R>L. Throat/Mouth: Teeth present, mucosa pink and moist, no lesions or ulcerations noted.  Neck: No adenopathy noted Cardiovascular: Normal rate and rhythm. S1,S2 noted.  No murmur, rubs or gallops noted.  Pulmonary/Chest: Normal effort and positive vesicular breath sounds. No respiratory distress. No wheezes, rales or ronchi noted.  Musculoskeletal:  No difficulty with gait.  Neurological: Alert and oriented.  Psychiatric: Mood and affect normal. Behavior is normal. Judgment and thought content normal.    BMET    Component Value Date/Time   NA 138 09/17/2019 1136   K 4.3 09/17/2019 1136   CL 102 09/17/2019 1136   CO2 24 09/17/2019 1136   GLUCOSE 90 09/17/2019 1136   BUN 16 09/17/2019 1136   CREATININE 0.89 09/17/2019 1136   CALCIUM 10.0 09/17/2019 1136   GFRNONAA 71 09/17/2019 1136   GFRAA 81 09/17/2019 1136    Lipid Panel     Component Value Date/Time   CHOL 165 09/17/2019 1136   TRIG 84 09/17/2019 1136   HDL 51 09/17/2019 1136   CHOLHDL 3.2 09/17/2019 1136   LDLCALC 98 09/17/2019 1136    CBC    Component Value Date/Time   WBC 6.5 09/17/2019 1136   RBC 5.03 09/17/2019 1136   HGB 14.9 09/17/2019 1136   HCT 43.8 09/17/2019 1136   PLT 250 09/17/2019 1136   MCV 87 09/17/2019 1136   MCH 29.6 09/17/2019 1136   MCHC 34.0 09/17/2019 1136   RDW 11.9 09/17/2019 1136   LYMPHSABS 2.1 09/17/2019 1136   EOSABS 0.2 09/17/2019 1136   BASOSABS 0.1 09/17/2019 1136    Hgb A1C No results found for: HGBA1C   Assessment and Plan:  Nasal Congestion after Covid 19  Start Flonase 1 spray each nostril twice daily x3 days then daily thereafter  Itching, Left Upper Back:  No obvious lesion, possibly dry skin Encouraged lotion and hydrocortisone cream OTC  RTC in 1 year for your annual  exam  Webb Silversmith, NP This visit occurred during the SARS-CoV-2 public health emergency.  Safety protocols were in place, including screening questions prior to the visit, additional usage of staff PPE, and extensive cleaning of exam room while observing appropriate contact time as indicated for disinfecting solutions.

## 2020-10-08 NOTE — Assessment & Plan Note (Signed)
Diet controlled.  

## 2020-10-08 NOTE — Assessment & Plan Note (Signed)
Deteriorated off medication Will restart Wellbutrin, Rx sent to pharmacy Support offered

## 2020-10-13 ENCOUNTER — Telehealth: Payer: Self-pay | Admitting: Physician Assistant

## 2020-10-13 DIAGNOSIS — L235 Allergic contact dermatitis due to other chemical products: Secondary | ICD-10-CM

## 2020-10-13 MED ORDER — TRIAMCINOLONE ACETONIDE 0.1 % EX CREA: 1.0000 "application " | TOPICAL_CREAM | Freq: Two times a day (BID) | CUTANEOUS | 0 refills | Status: DC

## 2020-10-13 MED ORDER — PREDNISONE 10 MG PO TABS: ORAL_TABLET | ORAL | 0 refills | Status: AC

## 2020-10-13 NOTE — Patient Instructions (Signed)
  Drue Novel, thank you for joining Leeanne Rio, PA-C for today's virtual visit.  While this provider is not your primary care provider (PCP), if your PCP is located in our provider database this encounter information will be shared with them immediately following your visit.  Consent: (Patient) Jennifer Garrison provided verbal consent for this virtual visit at the beginning of the encounter.  Current Medications:  Current Outpatient Medications:    Ascorbic Acid (VITAMIN C) 100 MG tablet, Take 100 mg by mouth daily., Disp: , Rfl:    buPROPion (WELLBUTRIN SR) 150 MG 12 hr tablet, Take 1 tablet (150 mg total) by mouth 2 (two) times daily., Disp: 180 tablet, Rfl: 1   cholecalciferol (VITAMIN D3) 25 MCG (1000 UNIT) tablet, Take 1,000 Units by mouth daily., Disp: , Rfl:    zinc gluconate 50 MG tablet, Take 50 mg by mouth daily., Disp: , Rfl:    Medications ordered in this encounter:  No orders of the defined types were placed in this encounter.    *If you need refills on other medications prior to your next appointment, please contact your pharmacy*  Follow-Up: Call back or seek an in-person evaluation if the symptoms worsen or if the condition fails to improve as anticipated.  Other Instructions Please keep skin clean and dry. Stay out of heat when possible. You can apply topical Witch hazel to the areas to soothe itch and help dry up the rash. The triamcinolone (Kenalog) cream can be applied to the most troublesome areas. Take the steroid taper as directed until completed to help resolve rash. Avoid use of undiluted essential oils to the skin to prevent further episodes of this.  If you have been instructed to have an in-person evaluation today at a local Urgent Care facility, please use the link below. It will take you to a list of all of our available [facility], including address, phone number and hours of operation. Please do not delay care.  [facility]  If you or a family member do not have a primary care provider, use the link below to schedule a visit and establish care. When you choose a [facility] primary care physician or advanced practice provider, you gain a long-term partner in health. Find a Primary Care Provider  Learn more about [facility] in-office and virtual care options: [facility]

## 2020-10-13 NOTE — Progress Notes (Signed)
Virtual Visit Consent   Daniell Paradise, you are scheduled for a virtual visit with a [facility] provider today.     Just as with appointments in the office, your consent must be obtained to participate.  Your consent will be active for this visit and any virtual visit you may have with one of our providers in the next 365 days.     If you have a MyChart account, a copy of this consent can be sent to you electronically.  All virtual visits are billed to your insurance company just like a traditional visit in the office.    As this is a virtual visit, video technology does not allow for your provider to perform a traditional examination.  This may limit your provider's ability to fully assess your condition.  If your provider identifies any concerns that need to be evaluated in person or the need to arrange testing (such as labs, EKG, etc.), we will make arrangements to do so.     Although advances in technology are sophisticated, we cannot ensure that it will always work on either your end or our end.  If the connection with a video visit is poor, the visit may have to be switched to a telephone visit.  With either a video or telephone visit, we are not always able to ensure that we have a secure connection.     I need to obtain your verbal consent now.   Are you willing to proceed with your visit today?    Caryssa Elzey has provided verbal consent on 10/13/2020 for a virtual visit (video or telephone).   Jennifer Garrison, [place]   Date: 10/13/2020 8:53 AM   Virtual Visit via Video Note   I, Jennifer Garrison, connected with  Kendy Haston  ([id number], 02-12-1961) on 10/13/20 at  8:45 AM EDT by a video-enabled telemedicine application and verified that I am speaking with the correct person using two identifiers.  Location: Patient: Virtual Visit Location Patient: Home Provider: Virtual Visit Location Provider: Home Office   I discussed the limitations of evaluation and management by  telemedicine and the availability of in person appointments. The patient expressed understanding and agreed to proceed.    History of Present Illness: Brittini Brubeck is a 60 y.o. who identifies as a female who was assigned female at birth, and is being seen today for rash of lower extremities, torso and back starting yesterday. Notes rash started after use of essential oils to the skin. Is pruritic but not painful. Has applied old script for triamcinolone to the area without much relief. Denies fevers, chills. Has rewashed all of her linens and such. Stopped use of essential oils.  HPI: HPI  Problems:  Patient Active Problem List   Diagnosis Date Noted   Anxiety and depression 05/01/2017   Irritable bowel syndrome with both constipation and diarrhea 02/21/2017    Allergies: No Known Allergies Medications:  Current Outpatient Medications:    predniSONE (DELTASONE) 10 MG tablet, Take 4 tablets (40 mg total) by mouth daily with breakfast for 4 days, THEN 3 tablets (30 mg total) daily with breakfast for 4 days, THEN 2 tablets (20 mg total) daily with breakfast for 3 days, THEN 1 tablet (10 mg total) daily with breakfast for 3 days., Disp: 37 tablet, Rfl: 0   triamcinolone cream (KENALOG) 0.1 %, Apply 1 application topically 2 (two) times daily., Disp: 30 g, Rfl: 0   Ascorbic Acid (VITAMIN C) 100 MG tablet, Take 100 mg  by mouth daily., Disp: , Rfl:    buPROPion (WELLBUTRIN SR) 150 MG 12 hr tablet, Take 1 tablet (150 mg total) by mouth 2 (two) times daily., Disp: 180 tablet, Rfl: 1   cholecalciferol (VITAMIN D3) 25 MCG (1000 UNIT) tablet, Take 1,000 Units by mouth daily., Disp: , Rfl:    zinc gluconate 50 MG tablet, Take 50 mg by mouth daily., Disp: , Rfl:   Observations/Objective: Patient is well-developed, well-nourished in no acute distress.  Resting comfortably at home.  Head is normocephalic, atraumatic.  No labored breathing. Speech is clear and coherent with logical content.  Patient is  alert and oriented at baseline.  Erythematous papulovesicular rash scattered on lower legs, polpliteal region and up to lower back and torso. Some areas of mild excoriation without exam evidence of superimposed cellulitis.   Assessment and Plan: 1. Allergic dermatitis due to other chemical product - triamcinolone cream (KENALOG) 0.1 %; Apply 1 application topically 2 (two) times daily.  Dispense: 30 g; Refill: 0 - predniSONE (DELTASONE) 10 MG tablet; Take 4 tablets (40 mg total) by mouth daily with breakfast for 4 days, THEN 3 tablets (30 mg total) daily with breakfast for 4 days, THEN 2 tablets (20 mg total) daily with breakfast for 3 days, THEN 1 tablet (10 mg total) daily with breakfast for 3 days.  Dispense: 37 tablet; Refill: 0 Supportive measures and OTC medications reviewed. Will send in new script for Kenalog to apply to moist troublesome areas. Giving widespread rash will begin steroid taper to help resolve symptoms. Will do 14 day taper to prevent rebound dermatitis.  Follow Up Instructions: I discussed the assessment and treatment plan with the patient. The patient was provided an opportunity to ask questions and all were answered. The patient agreed with the plan and demonstrated an understanding of the instructions.  A copy of instructions were sent to the patient via MyChart.  The patient was advised to call back or seek an in-person evaluation if the symptoms worsen or if the condition fails to improve as anticipated.  Time:  I spent 10 minutes with the patient via telehealth technology discussing the above problems/concerns.    Jennifer Rio, PA-C

## 2020-10-22 ENCOUNTER — Other Ambulatory Visit: Payer: Self-pay

## 2020-10-22 ENCOUNTER — Ambulatory Visit
Admission: RE | Admit: 2020-10-22 | Discharge: 2020-10-22 | Disposition: A | Discharge status: Home / Self Care | Payer: Managed Care, Other (non HMO) | Source: Ambulatory Visit | Attending: Internal Medicine | Admitting: Internal Medicine

## 2020-10-22 DIAGNOSIS — Z1231 Encounter for screening mammogram for malignant neoplasm of breast: Secondary | ICD-10-CM | POA: Diagnosis present

## 2020-11-11 ENCOUNTER — Other Ambulatory Visit: Payer: Self-pay

## 2020-11-12 ENCOUNTER — Other Ambulatory Visit: Payer: Self-pay

## 2020-11-12 ENCOUNTER — Ambulatory Visit: Payer: Managed Care, Other (non HMO) | Admitting: Internal Medicine

## 2020-11-12 ENCOUNTER — Encounter: Payer: Self-pay | Admitting: Internal Medicine

## 2020-11-12 VITALS — BP 101/48 | HR 61 | Temp 97.5°F | Resp 17 | Ht 64.0 in | Wt 120.0 lb

## 2020-11-12 DIAGNOSIS — R21 Rash and other nonspecific skin eruption: Secondary | ICD-10-CM

## 2020-11-12 DIAGNOSIS — W57XXXA Bitten or stung by nonvenomous insect and other nonvenomous arthropods, initial encounter: Secondary | ICD-10-CM | POA: Diagnosis not present

## 2020-11-12 MED ORDER — PREDNISONE 20 MG PO TABS
20.0000 mg | ORAL_TABLET | Freq: Every day | ORAL | 0 refills | Status: DC
Start: 2020-11-12 — End: 2020-12-28

## 2020-11-12 MED ORDER — BUPROPION HCL ER (SR) 150 MG PO TB12: 150.0000 mg | ORAL_TABLET | Freq: Two times a day (BID) | ORAL | 3 refills | Status: DC

## 2020-11-12 NOTE — Patient Instructions (Signed)
Insect Bite, Adult An insect bite can make your skin red, itchy, and swollen. Some insects can spread disease to people with a bite. However, most insect bites do not lead todisease, and most are not serious. What are the causes? Insects may bite for many reasons, including: Hunger. To defend themselves. Insects that bite include: Spiders. Mosquitoes. Ticks. Fleas. Ants. Flies. Kissing bugs. Chiggers. What are the signs or symptoms? Symptoms of this condition include: Itching or pain in the bite area. Redness and swelling in the bite area. An open wound (skin ulcer). Symptoms often last for 2-4 days. In rare cases, a person may have a very bad allergic reaction (anaphylactic reaction) to a bite. Symptoms of an anaphylactic reaction may include: Feeling warm in the face (flushed). Your face may turn red. Itchy, red, swollen areas of skin (hives). Swelling of the: Eyes. Lips. Face. Mouth. Tongue. Throat. Trouble with any of these: Breathing. Talking. Swallowing. Loud breathing (wheezing). Feeling dizzy or light-headed. Passing out (fainting). Pain or cramps in your belly. Throwing up (vomiting). Watery poop (diarrhea). How is this treated? Treatment is usually not needed. Symptoms often go away on their own. When treatment is needed, it may involve: Putting a cream or lotion on the bite area. This helps with itching. Taking an antibiotic medicine. This treatment is needed if the bite area gets infected. Getting a tetanus shot, if you are not up to date on this vaccine. Putting ice on the affected area. Using medicines called antihistamines. This treatment may be needed if you have itching or an allergic reaction to the insect bite. Giving yourself a shot of medicine (epinephrine) using an auto-injector "pen" if you have an anaphylactic reaction to a bite. Your doctor will teach you how to use this pen. Follow these instructions at home: Bite area care  Do not scratch  the bite area. Keep the bite area clean and dry. Wash the bite area every day with soap and water as told by your doctor. Check the bite area every day for signs of infection. Check for: Redness, swelling, or pain. Fluid or blood. Warmth. Pus or a bad smell.  Managing pain, itching, and swelling  You may put any of these on the bite area as told by your doctor: A paste made of baking soda and water. Cortisone cream. Calamine lotion. If told, put ice on the bite area. Put ice in a plastic bag. Place a towel between your skin and the bag. Leave the ice on for 20 minutes, 2-3 times a day.  General instructions Apply or take over-the-counter and prescription medicines only as told by your doctor. If you were prescribed an antibiotic medicine, take or apply it as told by your doctor. Do not stop using the antibiotic even if your condition improves. Keep all follow-up visits as told by your doctor. This is important. How is this prevented? To help you have a lower risk of insect bites: When you are outside, wear clothing that covers your arms and legs. Use insect repellent. The best insect repellents contain one of these: DEET. Picaridin. Oil of lemon eucalyptus (OLE). [id number]. Consider spraying your clothing with a pesticide called permethrin. Permethrin helps prevent insect bites. It works for several weeks and for up to 5-6 clothing washes. Do not apply permethrin directly to the skin. If your home windows do not have screens, think about putting some in. If you will be sleeping in an area where there are mosquitoes, consider covering your sleeping area with a  mosquito net. Contact a doctor if: You have redness, swelling, or pain in the bite area. You have fluid or blood coming from the bite area. The bite area feels warm to the touch. You have pus or a bad smell coming from the bite area. You have a fever. Get help right away if: You have joint pain. You have a rash. You feel  more tired or sleepy than you normally do. You have neck pain. You have a headache. You feel weaker than you normally do. You have signs of an anaphylactic reaction. Signs may include: Feeling warm in the face. Itchy, red, swollen areas of skin. Swelling of your: Eyes. Lips. Face. Mouth. Tongue. Throat. Trouble with any of these: Breathing. Talking. Swallowing. Loud breathing. Feeling dizzy or light-headed. Passing out. Pain or cramps in your belly. Throwing up. Watery poop. These symptoms may be an emergency. Do not wait to see if the symptoms will go away. Do this right away: Use your auto-injector pen as you have been told. Get medical help. Call your local emergency services (911 in the [place] Do not drive yourself to the hospital. Summary An insect bite can make your skin red, itchy, and swollen. Treatment is usually not needed. Symptoms often go away on their own. Do not scratch the bite area. Keep it clean and dry. Ice can help with pain and itching from the bite. This information is not intended to replace advice given to you by your health care provider. Make sure you discuss any questions you have with your healthcare provider. Document Revised: 02/04/2020 Document Reviewed: 09/22/2017 Elsevier Patient Education  2022 [employer].

## 2020-11-12 NOTE — Progress Notes (Signed)
Subjective:    Patient ID: Jennifer Garrison, female    DOB: 1961-01-15, 60 y.o.   MRN: [id number]  HPI  Patient presents to the clinic today for urgent care follow-up for a rash.  She did an Evist 7/19 for rash.  She reports the rash was located on her left thigh, left side of her abdomen and left upper back.  She reports the rash was itchy.  She thought it was possible allergic reaction secondary to using essential oils.  She had stopped using essential oils and washed all of her linens in hot water.  She does have a plastic mattress cover on her mattress.  She was diagnosed with possible contact dermatitis, treated with 10-day Prednisone taper and Triamcinolone cream.  She reports improvement with the rash while on Prednisone but reports the rash came back as soon as she stopped it.  She went to [facility] 8/12 for the same.  She was diagnosed with insect bites.  She was treated with a another 6-day Prednisone taper.  She finished her last dose of prednisone today.  She does feel like the rash has improved but is concerned that it is still present.  She has been using [place] spray to help with the itching.  Review of Systems     Past Medical History:  Diagnosis Date   Allergy    Anxiety    IBS (irritable bowel syndrome)    Trigger finger    Left Middle Finger 11/2016    Current Outpatient Medications  Medication Sig Dispense Refill   buPROPion (WELLBUTRIN SR) 150 MG 12 hr tablet Take 1 tablet (150 mg total) by mouth 2 (two) times daily. 180 tablet 1   triamcinolone cream (KENALOG) 0.1 % Apply 1 application topically 2 (two) times daily. 30 g 0   witch hazel-glycerin (TUCKS) pad Apply 1 application topically as needed for itching.     No current facility-administered medications for this visit.    No Known Allergies  Family History  Problem Relation Age of Onset   Lung cancer Mother    Hypertension Mother    Lupus Father    Breast cancer Neg Hx     Social  History   Socioeconomic History   Marital status: Married    Spouse name: Not on file   Number of children: 1   Years of education: Not on file   Highest education level: Not on file  Occupational History   Not on file  Tobacco Use   Smoking status: Former    Types: Cigarettes    Quit date: 09/09/1992    Years since quitting: 28.1   Smokeless tobacco: Current    Types: Chew   Tobacco comments:    Chewing tobacco   Vaping Use   Vaping Use: Some days  Substance and Sexual Activity   Alcohol use: Yes    Comment: socially    Drug use: No   Sexual activity: Never  Other Topics Concern   Not on file  Social History Narrative   Not on file   Social Determinants of Health   Financial Resource Strain: Not on file  Food Insecurity: Not on file  Transportation Needs: Not on file  Physical Activity: Not on file  Stress: Not on file  Social Connections: Not on file  Intimate Partner Violence: Not on file     Constitutional: Denies fever, malaise, fatigue, headache or abrupt weight changes.  HEENT: Denies eye pain, eye redness, ear pain,  ringing in the ears, wax buildup, runny nose, nasal congestion, bloody nose, or sore throat. Respiratory: Denies difficulty breathing, shortness of breath, cough or sputum production.   Cardiovascular: Denies chest pain, chest tightness, palpitations or swelling in the hands or feet.  Skin: Patient reports rash.  Denies ulcerations.   No other specific complaints in a complete review of systems (except as listed in HPI above).  Objective:   Physical Exam   BP (!) 101/48 (BP Location: Left Arm, Patient Position: Sitting, Cuff Size: Normal)   Pulse 61   Temp (!) 97.5 F (36.4 C) (Temporal)   Resp 17   Ht '5\' 4"'$  (1.626 m)   Wt 120 lb (54.4 kg)   SpO2 99%   BMI 20.60 kg/m  Wt Readings from Last 3 Encounters:  11/12/20 120 lb (54.4 kg)  10/08/20 113 lb 12.8 oz (51.6 kg)  10/10/19 115 lb (52.2 kg)    General: Appears her stated age,  well developed, well nourished in NAD. Skin: Grouped, scabbed papular lesions noted of left medial thigh, left lateral abdomen and left upper back. HEENT: Head: normal shape and size; Eyes: sclera white and EOMs intact; Cardiovascular: Normal rate. Pulmonary/Chest: Normal effort. Neurological: Alert and oriented.    BMET    Component Value Date/Time   NA 138 09/17/2019 1136   K 4.3 09/17/2019 1136   CL 102 09/17/2019 1136   CO2 24 09/17/2019 1136   GLUCOSE 90 09/17/2019 1136   BUN 16 09/17/2019 1136   CREATININE 0.89 09/17/2019 1136   CALCIUM 10.0 09/17/2019 1136   GFRNONAA 71 09/17/2019 1136   GFRAA 81 09/17/2019 1136    Lipid Panel     Component Value Date/Time   CHOL 165 09/17/2019 1136   TRIG 84 09/17/2019 1136   HDL 51 09/17/2019 1136   CHOLHDL 3.2 09/17/2019 1136   LDLCALC 98 09/17/2019 1136    CBC    Component Value Date/Time   WBC 6.5 09/17/2019 1136   RBC 5.03 09/17/2019 1136   HGB 14.9 09/17/2019 1136   HCT 43.8 09/17/2019 1136   PLT 250 09/17/2019 1136   MCV 87 09/17/2019 1136   MCH 29.6 09/17/2019 1136   MCHC 34.0 09/17/2019 1136   RDW 11.9 09/17/2019 1136   LYMPHSABS 2.1 09/17/2019 1136   EOSABS 0.2 09/17/2019 1136   BASOSABS 0.1 09/17/2019 1136    Hgb A1C No results found for: HGBA1C          Assessment & Plan:   UC follow-up for Insect Bites:  Unable to review UC notes as they are not in epic Rash is consistent with insect bites and not contact dermatitis Will extend Prednisone for 5 additional days, Rx for Prednisone 20 mg daily Advised her to use Triamcinolone cream on the areas that are still inflamed Okay to continue [employer]	[place] spray If persists, would consider using Permethrin prior to referral for dermatology  Return precautions discussed Webb Silversmith, NP This visit occurred during the SARS-CoV-2 public health emergency.  Safety protocols were in place, including screening questions prior to the visit, additional usage of  staff PPE, and extensive cleaning of exam room while observing appropriate contact time as indicated for disinfecting solutions.

## 2020-12-28 ENCOUNTER — Ambulatory Visit
Admission: RE | Admit: 2020-12-28 | Discharge: 2020-12-28 | Disposition: A | Discharge status: Home / Self Care | Payer: Managed Care, Other (non HMO) | Source: Ambulatory Visit | Attending: Emergency Medicine | Admitting: Emergency Medicine

## 2020-12-28 ENCOUNTER — Other Ambulatory Visit: Payer: Self-pay

## 2020-12-28 VITALS — BP 130/84 | HR 77 | Temp 98.1°F | Resp 18

## 2020-12-28 DIAGNOSIS — J029 Acute pharyngitis, unspecified: Secondary | ICD-10-CM | POA: Diagnosis not present

## 2020-12-28 DIAGNOSIS — B349 Viral infection, unspecified: Secondary | ICD-10-CM | POA: Diagnosis not present

## 2020-12-28 DIAGNOSIS — J039 Acute tonsillitis, unspecified: Secondary | ICD-10-CM

## 2020-12-28 LAB — POCT RAPID STREP A (OFFICE): Rapid Strep A Screen: NEGATIVE

## 2020-12-28 MED ORDER — PREDNISONE 10 MG (21) PO TBPK
ORAL_TABLET | Freq: Every day | ORAL | 0 refills | Status: DC
Start: 2020-12-28 — End: 2021-01-04

## 2020-12-28 MED ORDER — FLUCONAZOLE 150 MG PO TABS: 150.0000 mg | ORAL_TABLET | Freq: Every day | ORAL | 0 refills | Status: DC

## 2020-12-28 MED ORDER — AMOXICILLIN 875 MG PO TABS
875.0000 mg | ORAL_TABLET | Freq: Two times a day (BID) | ORAL | 0 refills | Status: DC
Start: 2020-12-28 — End: 2021-01-04

## 2020-12-28 NOTE — Discharge Instructions (Addendum)
Your rapid strep test is negative.     Your COVID test is pending.  You should self quarantine until the test result is back.    Take Tylenol or ibuprofen as needed for fever or discomfort.  Rest and keep yourself hydrated.    Follow-up with your primary care provider if your symptoms are not improving.     

## 2020-12-28 NOTE — ED Triage Notes (Signed)
Onset of symptoms Friday night.  Patient has sore throat, reports white spots on tonsils, lymph nodes are swollen and feels achy

## 2020-12-28 NOTE — ED Provider Notes (Signed)
Jennifer Garrison    CSN: [id number] Arrival date & time: 12/28/20  0859      History   Chief Complaint Chief Complaint  Patient presents with   Appointment    9:00   Sore Throat    HPI Jennifer Garrison is a 60 y.o. female.  Patient presents with 3-day history of sore throat, white spots on her tonsils, swollen lymph nodes in her neck.  She also reports chills.  She denies fever, rash, cough, shortness of breath, or other symptoms.  OTC treatment attempted at home.  Patient reports history of frequent strep throat.  Her medical history includes IBS, anxiety, depression, seasonal allergies.  The history is provided by the patient and medical records.   Past Medical History:  Diagnosis Date   Allergy    Anxiety    IBS (irritable bowel syndrome)    Trigger finger    Left Middle Finger 11/2016    Patient Active Problem List   Diagnosis Date Noted   Anxiety and depression 05/01/2017   Irritable bowel syndrome with both constipation and diarrhea 02/21/2017    Past Surgical History:  Procedure Laterality Date   ABDOMINAL HYSTERECTOMY  02/2016   Only cervix remains (done for prolapse)   AUGMENTATION MAMMAPLASTY Bilateral 2010   BREAST CYST ASPIRATION Left 2010   neg   BREAST ENHANCEMENT SURGERY  1998   CHOLECYSTECTOMY     COLONOSCOPY WITH PROPOFOL N/A 10/10/2019   Procedure: COLONOSCOPY WITH PROPOFOL;  Surgeon: Jonathon Bellows, MD;  Location: [facility] ENDOSCOPY;  Service: Gastroenterology;  Laterality: N/A;   TYMPANOPLASTY Left     OB History   No obstetric history on file.      Home Medications    Prior to Admission medications   Medication Sig Start Date End Date Taking? Authorizing Provider  amoxicillin (AMOXIL) 875 MG tablet Take 1 tablet (875 mg total) by mouth 2 (two) times daily for 7 days. 12/28/20 01/04/21 Yes Sharion Balloon, NP  fluconazole (DIFLUCAN) 150 MG tablet Take 1 tablet (150 mg total) by mouth daily. Take one tablet today. 12/28/20  Yes Sharion Balloon, NP   predniSONE (STERAPRED UNI-PAK 21 TAB) 10 MG (21) TBPK tablet Take by mouth daily. As directed 12/28/20  Yes Sharion Balloon, NP  buPROPion [facility] SR) 150 MG 12 hr tablet Take 1 tablet (150 mg total) by mouth 2 (two) times daily. 11/12/20   Jearld Fenton, NP  triamcinolone cream (KENALOG) 0.1 % Apply 1 application topically 2 (two) times daily. 10/13/20   Brunetta Jeans, PA-C  witch hazel-glycerin (TUCKS) pad Apply 1 application topically as needed for itching.    [provider]    Family History Family History  Problem Relation Age of Onset   Lung cancer Mother    Hypertension Mother    Lupus Father    Breast cancer Neg Hx     Social History Social History   Tobacco Use   Smoking status: Former    Types: Cigarettes    Quit date: 09/09/1992    Years since quitting: 28.3   Smokeless tobacco: Current    Types: Chew   Tobacco comments:    Chewing tobacco   Vaping Use   Vaping Use: Some days  Substance Use Topics   Alcohol use: Yes    Comment: socially    Drug use: No     Allergies   Patient has no known allergies.   Review of Systems Review of Systems  Constitutional:  Negative  for chills and fever.  HENT:  Positive for sore throat. Negative for ear pain.   Respiratory:  Negative for cough and shortness of breath.   Cardiovascular:  Negative for chest pain and palpitations.  Gastrointestinal:  Negative for abdominal pain and vomiting.  Skin:  Negative for color change and rash.  All other systems reviewed and are negative.   Physical Exam Triage Vital Signs ED Triage Vitals [12/28/20 0933]  Enc Vitals Group     BP 130/84     Pulse Rate 77     Resp 18     Temp 98.1 F (36.7 C)     Temp Source Oral     SpO2 98 %     Weight      Height      Head Circumference      Peak Flow      Pain Score      Pain Loc      Pain Edu?      Excl. in [place]?    No data found.  Updated Vital Signs BP 130/84 (BP Location: Left Arm)   Pulse 77   Temp 98.1 F  (36.7 C) (Oral)   Resp 18   SpO2 98%   Visual Acuity Right Eye Distance:   Left Eye Distance:   Bilateral Distance:    Right Eye Near:   Left Eye Near:    Bilateral Near:     Physical Exam Vitals and nursing note reviewed.  Constitutional:      General: She is not in acute distress.    Appearance: She is well-developed. She is not ill-appearing.  HENT:     Head: Normocephalic and atraumatic.     Right Ear: Tympanic membrane normal.     Left Ear: Tympanic membrane normal.     Nose: Nose normal.     Mouth/Throat:     Mouth: Mucous membranes are moist.     Pharynx: Posterior oropharyngeal erythema present.     Tonsils: 2+ on the right. 1+ on the left.  Eyes:     Conjunctiva/sclera: Conjunctivae normal.  Cardiovascular:     Rate and Rhythm: Normal rate and regular rhythm.     Heart sounds: Normal heart sounds.  Pulmonary:     Effort: Pulmonary effort is normal. No respiratory distress.     Breath sounds: Normal breath sounds.  Abdominal:     Palpations: Abdomen is soft.     Tenderness: There is no abdominal tenderness.  Musculoskeletal:     Cervical back: Neck supple.  Skin:    General: Skin is warm and dry.  Neurological:     General: No focal deficit present.     Mental Status: She is alert and oriented to person, place, and time.     Gait: Gait normal.  Psychiatric:        Mood and Affect: Mood normal.        Behavior: Behavior normal.     UC Treatments / Results  Labs (all labs ordered are listed, but only abnormal results are displayed) Labs Reviewed  NOVEL CORONAVIRUS, NAA  POCT RAPID STREP A (OFFICE)    EKG   Radiology No results found.  Procedures Procedures (including critical care time)  Medications Ordered in UC Medications - No data to display  Initial Impression / Assessment and Plan / UC Course  I have reviewed the triage vital signs and the nursing notes.  Pertinent labs & imaging results that were available during my care of  the  patient were reviewed by me and considered in my medical decision making (see chart for details).  Sore throat, acute tonsillitis.  Rapid strep negative.  Based on patient's exam today and her history of frequent strep, treating with amoxicillin and prednisone.  Patient also reports history of yeast infections when taking antibiotics so 1 tablet of Diflucan prescribed.  COVID pending.  Instructed patient to self quarantine per CDC guidelines.  Instructed patient to follow up with PCP if symptoms are not improving.  Patient agrees to plan of care.    Final Clinical Impressions(s) / UC Diagnoses   Final diagnoses:  Sore throat  Acute tonsillitis, unspecified etiology     Discharge Instructions      Your rapid strep test is negative.      Your COVID test is pending.  You should self quarantine until the test result is back.    Take Tylenol or ibuprofen as needed for fever or discomfort.  Rest and keep yourself hydrated.    Follow-up with your primary care provider if your symptoms are not improving.         ED Prescriptions     Medication Sig Dispense Auth. Provider   amoxicillin (AMOXIL) 875 MG tablet Take 1 tablet (875 mg total) by mouth 2 (two) times daily for 7 days. 14 tablet Sharion Balloon, NP   predniSONE (STERAPRED UNI-PAK 21 TAB) 10 MG (21) TBPK tablet Take by mouth daily. As directed 21 tablet Sharion Balloon, NP   fluconazole (DIFLUCAN) 150 MG tablet Take 1 tablet (150 mg total) by mouth daily. Take one tablet today. 1 tablet Sharion Balloon, NP      PDMP not reviewed this encounter.   Sharion Balloon, NP 12/28/20 1016

## 2020-12-29 LAB — SARS-COV-2, NAA 2 DAY TAT

## 2020-12-29 LAB — NOVEL CORONAVIRUS, NAA: SARS-CoV-2, NAA: NOT DETECTED

## 2021-01-01 ENCOUNTER — Ambulatory Visit: Payer: Self-pay

## 2021-01-01 NOTE — Telephone Encounter (Signed)
Message from Oneta Rack sent at 01/01/2021  8:18 AM EDT  Summary: Clinical Advice   Patient started amoxicillin on 12/28/2020 and stopped it last night because its causing her to urinate frequently. Patient has 6 pills left and does not want to continue.Patient seeking clinical advice prior to her 11/04/2020 follow up appointment with PCP           Call History   Type Contact Phone/Fax User  01/01/2021 08:16 AM EDT Phone (Incoming) Garrison, Jennifer "Deb" (Self) [phone] [name]) Gloriann Loan, Tiffany M  Pt. Treated for tonsilitis Monday with Amoxicillin and Prednisone. Reports she is having severe urinary frequency "which I read can be caused by the Amoxicillin." States she is afraid to take anymore. States throat "is feeling a little better." Will continue Prednisone.Has an appointment 01/04/21 with PCP. Asking if another antibiotic could be sent to her pharmacy. Please advise pt.   Answer Assessment - Initial Assessment Questions 1. NAME of MEDICATION: "What medicine are you calling about?"     Amoxicillin 2. QUESTION: "What is your question?" (e.g., double dose of medicine, side effect)     Side effect 3. PRESCRIBING HCP: "Who prescribed it?" Reason: if prescribed by specialist, call should be referred to that group.     UC 4. SYMPTOMS: "Do you have any symptoms?"     Frequent urination 5. SEVERITY: If symptoms are present, ask "Are they mild, moderate or severe?"     Severe every 15 minutes 6. PREGNANCY:  "Is there any chance that you are pregnant?" "When was your last menstrual period?"     No  Protocols used: Medication Question Call-A-AH

## 2021-01-01 NOTE — Telephone Encounter (Signed)
I am not going to send in another antibiotic without seeing her, since this was prescribed at urgent care and not by my. If Amoxicillin is causing urinary frequency, this is merely a nuisance and not a life threatening allergic reaction. My recommendation is she continue Amoxicillin until course completed.

## 2021-01-04 ENCOUNTER — Ambulatory Visit: Payer: Managed Care, Other (non HMO) | Admitting: Internal Medicine

## 2021-01-04 ENCOUNTER — Other Ambulatory Visit: Payer: Self-pay

## 2021-01-04 ENCOUNTER — Encounter: Payer: Self-pay | Admitting: Internal Medicine

## 2021-01-04 VITALS — BP 104/49 | HR 56 | Temp 97.5°F | Resp 18 | Ht 64.0 in | Wt 119.6 lb

## 2021-01-04 DIAGNOSIS — J039 Acute tonsillitis, unspecified: Secondary | ICD-10-CM

## 2021-01-04 DIAGNOSIS — R3915 Urgency of urination: Secondary | ICD-10-CM | POA: Diagnosis not present

## 2021-01-04 DIAGNOSIS — R350 Frequency of micturition: Secondary | ICD-10-CM | POA: Diagnosis not present

## 2021-01-04 NOTE — Patient Instructions (Signed)
Tonsillitis Tonsillitis is an infection of the throat. This infection causes the tonsils to become red, tender, and swollen. Tonsils are tissues in the back of your throat. If bacteria caused your infection, antibiotic medicine will be given to you. Sometimes, symptoms of this infection can be treated with the use of medicines that lessen swelling (steroids). If your tonsillitis is very bad (severe) and happens often, you may need to get your tonsils removed (tonsillectomy). Follow these instructions at home: Medicines Take over-the-counter and prescription medicines only as told by your doctor. If you were prescribed an antibiotic, take it as told by your doctor. Do not stop taking the antibiotic even if you start to feel better. Eating and drinking Drink enough fluid to keep your pee (urine) clear or pale yellow. While your throat is sore, eat soft or liquid foods like: Soup. Sherbert. Instant breakfast drinks. Drink warm fluids. Eat frozen ice pops. General instructions Rest as much as possible and get plenty of sleep. Gargle with a salt-water mixture 3-4 times a day or as needed. To make a salt-water mixture, completely dissolve -1 tsp of salt in 1 cup of warm water. Do not swallow salt-water mixture. Wash your hands often with soap and water. If there is no soap and water, use hand sanitizer. Do not share cups, bottles, or other utensils until your symptoms are gone. Do not smoke. If you need help quitting, ask your doctor. Keep all follow-up visits as told by your doctor. This is important. Contact a doctor if: You have large, tender lumps in your neck. You have a fever that does not go away after 2-3 days. You have a rash. You cough up green, yellow-brown, or bloody fluid. You cannot swallow liquids or food for 24 hours. Only one of your tonsils is swollen. Get help right away if: You have any new symptoms such as: Vomiting. Very bad headache. Stiff neck. Chest  pain. Trouble breathing or swallowing. You have very bad throat pain and you also have drooling or voice changes. You have very bad pain that is not helped by medicine. You cannot fully open your mouth. You have redness, swelling, or severe pain anywhere in your neck. Summary Tonsillitis causes your tonsils to be red, tender, and swollen. While your throat is sore, eat soft or liquid foods. Gargle with a salt-water mixture 3-4 times a day or as needed. Do not share cups, bottles, or other utensils until your symptoms are gone. This information is not intended to replace advice given to you by your health care provider. Make sure you discuss any questions you have with your health care provider. Document Revised: 07/13/2020 Document Reviewed: 02/27/2020 Elsevier Patient Education  2022 [employer].

## 2021-01-04 NOTE — Progress Notes (Signed)
Subjective:    Patient ID: Jennifer Garrison, female    DOB: [birth year], 60 y.o.   MRN: [id number]  HPI  Patient presents the clinic today for urgent care follow-up.  She went to the urgent care 10/3 with complaint of sore throat. Rapid strep test and rapid covid test was negative. PCR covid was also negative. She was treated for presumed bacterial tonsillitis with Amoxicillin and Prednisone. She was given Diflucan for antibiotic induced yeast infection. She was advised to follow up with her PCP. Since that time, she reports she stopped the antibiotic after 4 days because it caused urinary frequency, urgency and bladder pressure. She finished her steroid yesterday. She denies headache, runny nose, ear pain, sore throat, cough, abdominal pain, urgency, frequency, dysuria, blood in her urine, vaginal symptoms, fever, chills, nausea or low back pain.  Review of Systems     Past Medical History:  Diagnosis Date   Allergy    Anxiety    IBS (irritable bowel syndrome)    Trigger finger    Left Middle Finger 11/2016    Current Outpatient Medications  Medication Sig Dispense Refill   amoxicillin (AMOXIL) 875 MG tablet Take 1 tablet (875 mg total) by mouth 2 (two) times daily for 7 days. 14 tablet 0   buPROPion (WELLBUTRIN SR) 150 MG 12 hr tablet Take 1 tablet (150 mg total) by mouth 2 (two) times daily. 180 tablet 3   fluconazole (DIFLUCAN) 150 MG tablet Take 1 tablet (150 mg total) by mouth daily. Take one tablet today. 1 tablet 0   predniSONE (STERAPRED UNI-PAK 21 TAB) 10 MG (21) TBPK tablet Take by mouth daily. As directed 21 tablet 0   triamcinolone cream (KENALOG) 0.1 % Apply 1 application topically 2 (two) times daily. 30 g 0   witch hazel-glycerin (TUCKS) pad Apply 1 application topically as needed for itching.     No current facility-administered medications for this visit.    No Known Allergies  Family History  Problem Relation Age of Onset   Lung cancer Mother    Hypertension Mother     Lupus Father    Breast cancer Neg Hx     Social History   Socioeconomic History   Marital status: Married    Spouse name: Not on file   Number of children: 1   Years of education: Not on file   Highest education level: Not on file  Occupational History   Not on file  Tobacco Use   Smoking status: Former    Types: Cigarettes    Quit date: 09/09/1992    Years since quitting: 28.3   Smokeless tobacco: Current    Types: Chew   Tobacco comments:    Chewing tobacco   Vaping Use   Vaping Use: Some days  Substance and Sexual Activity   Alcohol use: Yes    Comment: socially    Drug use: No   Sexual activity: Never  Other Topics Concern   Not on file  Social History Narrative   Not on file   Social Determinants of Health   Financial Resource Strain: Not on file  Food Insecurity: Not on file  Transportation Needs: Not on file  Physical Activity: Not on file  Stress: Not on file  Social Connections: Not on file  Intimate Partner Violence: Not on file     Constitutional: Denies fever, malaise, fatigue, headache or abrupt weight changes.  HEENT: Denies eye pain, eye redness, ear pain, ringing in the ears, wax buildup,  runny nose, nasal congestion, bloody nose, or sore throat. Respiratory: Denies difficulty breathing, shortness of breath, cough or sputum production.   Cardiovascular: Denies chest pain, chest tightness, palpitations or swelling in the hands or feet.  Gastrointestinal: Denies abdominal pain, bloating, constipation, diarrhea or blood in the stool.  GU: Denies urgency, frequency, pain with urination, burning sensation, blood in urine, odor or discharge. Skin: Denies redness, rashes, lesions or ulcercations.   No other specific complaints in a complete review of systems (except as listed in HPI above).  Objective:   Physical Exam  BP (!) 104/49 (BP Location: Left Arm, Patient Position: Sitting, Cuff Size: Normal)   Pulse (!) 56   Temp (!) 97.5 F (36.4  C) (Temporal)   Resp 18   Ht 5\' 4"  (1.626 m)   Wt 119 lb 9.6 oz (54.3 kg)   SpO2 100%   BMI 20.53 kg/m   Wt Readings from Last 3 Encounters:  11/12/20 120 lb (54.4 kg)  10/08/20 113 lb 12.8 oz (51.6 kg)  10/10/19 115 lb (52.2 kg)    General: Appears her stated age, well developed, well nourished in NAD. Skin: Warm, dry and intact. No rashes noted. HEENT: Head: normal shape and size; Throat/Mouth: Teeth present, mucosa pink and moist, no exudate, lesions or ulcerations noted.  Neck: No adenopathy noted. Cardiovascular: Normal rate and rhythm. S1,S2 noted.  No murmur, rubs or gallops noted.  Pulmonary/Chest: Normal effort and positive vesicular breath sounds. No respiratory distress. No wheezes, rales or ronchi noted.  Neurological: Alert and oriented.    BMET    Component Value Date/Time   NA 138 09/17/2019 1136   K 4.3 09/17/2019 1136   CL 102 09/17/2019 1136   CO2 24 09/17/2019 1136   GLUCOSE 90 09/17/2019 1136   BUN 16 09/17/2019 1136   CREATININE 0.89 09/17/2019 1136   CALCIUM 10.0 09/17/2019 1136   GFRNONAA 71 09/17/2019 1136   GFRAA 81 09/17/2019 1136    Lipid Panel     Component Value Date/Time   CHOL 165 09/17/2019 1136   TRIG 84 09/17/2019 1136   HDL 51 09/17/2019 1136   CHOLHDL 3.2 09/17/2019 1136   LDLCALC 98 09/17/2019 1136    CBC    Component Value Date/Time   WBC 6.5 09/17/2019 1136   RBC 5.03 09/17/2019 1136   HGB 14.9 09/17/2019 1136   HCT 43.8 09/17/2019 1136   PLT 250 09/17/2019 1136   MCV 87 09/17/2019 1136   MCH 29.6 09/17/2019 1136   MCHC 34.0 09/17/2019 1136   RDW 11.9 09/17/2019 1136   LYMPHSABS 2.1 09/17/2019 1136   EOSABS 0.2 09/17/2019 1136   BASOSABS 0.1 09/17/2019 1136    Hgb A1C No results found for: HGBA1C         Assessment & Plan:   UC Follow Up for Acute Tonsillitis, Urinary Urgency and Frequency:  UC notes and labs reviewed She is concerned about allergic reaction to amoxicillin.  Advised her that what she  was experiencing was likely just a unwanted side effect and not exactly an allergic reaction. She did stop antibiotic and symptoms resolved She is finished her prednisone taper She denies any URI or urinary symptoms at this time Offered to check urine for infection but will hold off at this time as she is asymptomatic  Return precautions discussed Webb Silversmith, NP This visit occurred during the SARS-CoV-2 public health emergency.  Safety protocols were in place, including screening questions prior to the visit, additional usage of  staff PPE, and extensive cleaning of exam room while observing appropriate contact time as indicated for disinfecting solutions.

## 2021-04-01 ENCOUNTER — Encounter: Payer: Self-pay | Admitting: Internal Medicine

## 2021-04-01 ENCOUNTER — Ambulatory Visit (INDEPENDENT_AMBULATORY_CARE_PROVIDER_SITE_OTHER): Payer: 59 | Admitting: Internal Medicine

## 2021-04-01 ENCOUNTER — Other Ambulatory Visit: Payer: Self-pay

## 2021-04-01 VITALS — BP 97/46 | HR 78 | Temp 97.9°F | Resp 17 | Ht 64.0 in | Wt 120.0 lb

## 2021-04-01 DIAGNOSIS — R110 Nausea: Secondary | ICD-10-CM | POA: Diagnosis not present

## 2021-04-01 DIAGNOSIS — L723 Sebaceous cyst: Secondary | ICD-10-CM | POA: Diagnosis not present

## 2021-04-01 DIAGNOSIS — H6120 Impacted cerumen, unspecified ear: Secondary | ICD-10-CM

## 2021-04-01 NOTE — Patient Instructions (Signed)
Earwax Buildup, Adult ?The ears produce a substance called earwax that helps keep bacteria out of the ear and protects the skin in the ear canal. Occasionally, earwax can build up in the ear and cause discomfort or hearing loss. ?What are the causes? ?This condition is caused by a buildup of earwax. Ear canals are self-cleaning. Ear wax is made in the outer part of the ear canal and generally falls out in small amounts over time. ?When the self-cleaning mechanism is not working, earwax builds up and can cause decreased hearing and discomfort. Attempting to clean ears with cotton swabs can push the earwax deep into the ear canal and cause decreased hearing and pain. ?What increases the risk? ?This condition is more likely to develop in people who: ?Clean their ears often with cotton swabs. ?Pick at their ears. ?Use earplugs or in-ear headphones often, or wear hearing aids. ?The following factors may also make you more likely to develop this condition: ?Being female. ?Being of older age. ?Naturally producing more earwax. ?Having narrow ear canals. ?Having earwax that is overly thick or sticky. ?Having excess hair in the ear canal. ?Having eczema. ?Being dehydrated. ?What are the signs or symptoms? ?Symptoms of this condition include: ?Reduced or muffled hearing. ?A feeling of fullness in the ear or feeling that the ear is plugged. ?Fluid coming from the ear. ?Ear pain or an itchy ear. ?Ringing in the ear. ?Coughing. ?Balance problems. ?An obvious piece of earwax that can be seen inside the ear canal. ?How is this diagnosed? ?This condition may be diagnosed based on: ?Your symptoms. ?Your medical history. ?An ear exam. During the exam, your health care provider will look into your ear with an instrument called an otoscope. ?You may have tests, including a hearing test. ?How is this treated? ?This condition may be treated by: ?Using ear drops to soften the earwax. ?Having the earwax removed by a health care provider. The  health care provider may: ?Flush the ear with water. ?Use an instrument that has a loop on the end (curette). ?Use a suction device. ?Having surgery to remove the wax buildup. This may be done in severe cases. ?Follow these instructions at home: ? ?Take over-the-counter and prescription medicines only as told by your health care provider. ?Do not put any objects, including cotton swabs, into your ear. You can clean the opening of your ear canal with a washcloth or facial tissue. ?Follow instructions from your health care provider about cleaning your ears. Do not overclean your ears. ?Drink enough fluid to keep your urine pale yellow. This will help to thin the earwax. ?Keep all follow-up visits as told. If earwax builds up in your ears often or if you use hearing aids, consider seeing your health care provider for routine, preventive ear cleanings. Ask your health care provider how often you should schedule your cleanings. ?If you have hearing aids, clean them according to instructions from the manufacturer and your health care provider. ?Contact a health care provider if: ?You have ear pain. ?You develop a fever. ?You have pus or other fluid coming from your ear. ?You have hearing loss. ?You have ringing in your ears that does not go away. ?You feel like the room is spinning (vertigo). ?Your symptoms do not improve with treatment. ?Get help right away if: ?You have bleeding from the affected ear. ?You have severe ear pain. ?Summary ?Earwax can build up in the ear and cause discomfort or hearing loss. ?The most common symptoms of this condition include   reduced or muffled hearing, a feeling of fullness in the ear, or feeling that the ear is plugged. ?This condition may be diagnosed based on your symptoms, your medical history, and an ear exam. ?This condition may be treated by using ear drops to soften the earwax or by having the earwax removed by a health care provider. ?Do not put any objects, including cotton  swabs, into your ear. You can clean the opening of your ear canal with a washcloth or facial tissue. ?This information is not intended to replace advice given to you by your health care provider. Make sure you discuss any questions you have with your health care provider. ?Document Revised: 07/02/2019 Document Reviewed: 07/02/2019 ?Elsevier Patient Education ? 2022 [employer] ? ?

## 2021-04-01 NOTE — Progress Notes (Signed)
Subjective:    Patient ID: Jennifer Garrison, female    DOB: 1960/08/10, 61 y.o.   MRN: [id number]  HPI  Patient presents to clinic today with complaint of a cyst of her scalp.  She noticed this years ago.  She has not noticed any drainage from the area.  She reports the areas are painful when she tries to lay her head down at night.  She has had them removed by dermatology in the past but they have since returned.  She would like referral to dermatology or general surgery to have this removed.  She also reports popping in her right ear.  She noticed this 4 days ago.  She denies ear pain, discharge or difficulty hearing.  She reports she had her ears irrigated at [facility] 3 weeks ago.  She has not tried anything OTC for this.  She also reports daily nausea.  She reports this occurs about 2 hours after waking up every day.  She denies reflux or vomiting.  She does have a history of IBS but reports those symptoms are fairly well controlled at this time.  She has not taken anything OTC for this.  Review of Systems     Past Medical History:  Diagnosis Date   Allergy    Anxiety    IBS (irritable bowel syndrome)    Trigger finger    Left Middle Finger 11/2016    Current Outpatient Medications  Medication Sig Dispense Refill   buPROPion (WELLBUTRIN SR) 150 MG 12 hr tablet Take 1 tablet (150 mg total) by mouth 2 (two) times daily. 180 tablet 3   triamcinolone cream (KENALOG) 0.1 % Apply 1 application topically 2 (two) times daily. 30 g 0   No current facility-administered medications for this visit.    No Known Allergies  Family History  Problem Relation Age of Onset   Lung cancer Mother    Hypertension Mother    Lupus Father    Breast cancer Neg Hx     Social History   Socioeconomic History   Marital status: Married    Spouse name: Not on file   Number of children: 1   Years of education: Not on file   Highest education level: Not on file  Occupational History   Not  on file  Tobacco Use   Smoking status: Former    Types: Cigarettes    Quit date: 09/09/1992    Years since quitting: 28.5   Smokeless tobacco: Current    Types: Chew   Tobacco comments:    Chewing tobacco   Vaping Use   Vaping Use: Some days  Substance and Sexual Activity   Alcohol use: Yes    Comment: socially    Drug use: No   Sexual activity: Never  Other Topics Concern   Not on file  Social History Narrative   Not on file   Social Determinants of Health   Financial Resource Strain: Not on file  Food Insecurity: Not on file  Transportation Needs: Not on file  Physical Activity: Not on file  Stress: Not on file  Social Connections: Not on file  Intimate Partner Violence: Not on file     Constitutional: Denies fever, malaise, fatigue, headache or abrupt weight changes.  HEENT: Patient reports right ear popping.  Denies eye pain, eye redness, ear pain, ringing in the ears, wax buildup, runny nose, nasal congestion, bloody nose, or sore throat. Respiratory: Denies difficulty breathing, shortness of breath, cough or sputum production.  Cardiovascular: Denies chest pain, chest tightness, palpitations or swelling in the hands or feet.  Gastrointestinal: Patient reports nausea.  Denies abdominal pain, bloating, constipation, diarrhea or blood in the stool.  GU: Denies urgency, frequency, pain with urination, burning sensation, blood in urine, odor or discharge. Skin: Patient reports cyst of scalp.  Denies redness, rashes, or ulcercations.  Neurological: Denies dizziness, difficulty with memory, difficulty with speech or problems with balance and coordination.  No other specific complaints in a complete review of systems (except as listed in HPI above).  Objective:  BP (!) 97/46 (BP Location: Right Arm, Patient Position: Sitting, Cuff Size: Normal)    Pulse 78    Temp 97.9 F (36.6 C) (Temporal)    Resp 17    Ht 5\' 4"  (1.626 m)    Wt 120 lb (54.4 kg)    SpO2 99%    BMI 20.60  kg/m   Wt Readings from Last 3 Encounters:  01/04/21 119 lb 9.6 oz (54.3 kg)  11/12/20 120 lb (54.4 kg)  10/08/20 113 lb 12.8 oz (51.6 kg)    General: Appears her stated age, well developed, well nourished in NAD. Skin: Multiple sebaceous cysts noted of posterior scalp. HEENT: Head: normal shape and size; Right Ear: Small amount of wax noted adjacent to the right TM, no TM erythema or effusion noted;  Cardiovascular: Normal rate. Pulmonary/Chest: Normal effort. Abdomen: Normal bowel sounds. Musculoskeletal:  No difficulty with gait.  Neurological: Alert and oriented.    BMET    Component Value Date/Time   NA 138 09/17/2019 1136   K 4.3 09/17/2019 1136   CL 102 09/17/2019 1136   CO2 24 09/17/2019 1136   GLUCOSE 90 09/17/2019 1136   BUN 16 09/17/2019 1136   CREATININE 0.89 09/17/2019 1136   CALCIUM 10.0 09/17/2019 1136   GFRNONAA 71 09/17/2019 1136   GFRAA 81 09/17/2019 1136    Lipid Panel     Component Value Date/Time   CHOL 165 09/17/2019 1136   TRIG 84 09/17/2019 1136   HDL 51 09/17/2019 1136   CHOLHDL 3.2 09/17/2019 1136   LDLCALC 98 09/17/2019 1136    CBC    Component Value Date/Time   WBC 6.5 09/17/2019 1136   RBC 5.03 09/17/2019 1136   HGB 14.9 09/17/2019 1136   HCT 43.8 09/17/2019 1136   PLT 250 09/17/2019 1136   MCV 87 09/17/2019 1136   MCH 29.6 09/17/2019 1136   MCHC 34.0 09/17/2019 1136   RDW 11.9 09/17/2019 1136   LYMPHSABS 2.1 09/17/2019 1136   EOSABS 0.2 09/17/2019 1136   BASOSABS 0.1 09/17/2019 1136    Hgb A1C No results found for: HGBA1C          Assessment & Plan:   Sebaceous Cyst of Scalp:  Referral to general surgeon for further evaluation and consideration of surgical excision  Ear Wax Buildup Right Ear:  She does not like Debrox, says this is painful when she uses it Offered referral to ENT but she declines at this time  Nausea:  Could be silent reflux versus related to IBS Discussed starting famotidine 10 mg  nightly but she declines at this time Will monitor   Return  precautions discussed Webb Silversmith, NP This visit occurred during the SARS-CoV-2 public health emergency.  Safety protocols were in place, including screening questions prior to the visit, additional usage of staff PPE, and extensive cleaning of exam room while observing appropriate contact time as indicated for disinfecting solutions.

## 2021-04-12 ENCOUNTER — Encounter: Payer: Self-pay | Admitting: Surgery

## 2021-04-12 ENCOUNTER — Other Ambulatory Visit: Payer: Self-pay

## 2021-04-12 ENCOUNTER — Ambulatory Visit (INDEPENDENT_AMBULATORY_CARE_PROVIDER_SITE_OTHER): Payer: 59 | Admitting: Surgery

## 2021-04-12 VITALS — BP 116/74 | HR 65 | Temp 98.3°F | Ht 63.5 in | Wt 122.6 lb

## 2021-04-12 DIAGNOSIS — L720 Epidermal cyst: Secondary | ICD-10-CM

## 2021-04-12 NOTE — Patient Instructions (Signed)
If you have any concerns or questions, please feel free to call our office. See follow up appointment below.   Epidermoid Cyst Removal, Care After This sheet gives you information about how to care for yourself after your procedure. Your health care provider may also give you more specific instructions. If you have problems or questions, contact your health care provider. What can I expect after the procedure? After the procedure, it is common to have: Soreness in the area where your cyst was removed. Tightness or itchiness from the stitches (sutures) in your skin. Follow these instructions at home: Medicines Take over-the-counter and prescription medicines only as told by your health care provider. If you were prescribed an antibiotic medicine or ointment, take or apply it as told by your health care provider. Do not stop using the antibiotic even if you start to feel better. Incision care  Follow instructions from your health care provider about how to take care of your incision. Make sure you: Wash your hands with soap and water for at least 20 seconds before you change your bandage (dressing). If soap and water are not available, use hand sanitizer. Change your dressing as told by your health care provider. Leave sutures, skin glue, or adhesive strips in place. These skin closures may need to stay in place for 1-2 weeks or longer. If adhesive strip edges start to loosen and curl up, you may trim the loose edges. Do not remove adhesive strips completely unless your health care provider tells you to do that. Keep the dressing dry until your health care provider says that it can be removed. After your dressing is off, check your incision area every day for signs of infection. Check for: Redness, swelling, or pain. Fluid or blood. Warmth. Pus or a bad smell. General instructions Do not take baths, swim, or use a hot tub until your health care provider approves. Ask your health care provider  if you may take showers. You may only be allowed to take sponge baths. Your health care provider may ask you to avoid contact sports or activities that take a lot of effort. Do not do anything that stretches or puts pressure on your incision. You can return to your normal diet. Keep all follow-up visits. This is important. Contact a health care provider if: You have a fever. You have redness, swelling, or pain in the incision area. You have fluid or blood coming from your incision. You have pus or a bad smell coming from your incision. Your incision feels warm to the touch. Your cyst grows back. Get help right away if: If the incision site suddenly increases in size and you have pain at the incision site. You may be checked for a collection of blood under the skin from the procedure (hematoma). Summary After the procedure, it is common to have soreness in the area where your cyst was removed. Take or apply over-the-counter and prescription medicines only as told by your health care provider. Follow instructions from your health care provider about how to take care of your incision. This information is not intended to replace advice given to you by your health care provider. Make sure you discuss any questions you have with your health care provider. Document Revised: 06/19/2019 Document Reviewed: 06/19/2019 Elsevier Patient Education  [employer].

## 2021-04-14 ENCOUNTER — Encounter: Payer: Self-pay | Admitting: Surgery

## 2021-04-14 NOTE — Progress Notes (Signed)
Outpatient Surgical Follow Up  04/14/2021  Jennifer Garrison is an 61 y.o. female.   Chief Complaint  Patient presents with   New Patient (Initial Visit)    Sebaceous cyst-scalp    HPI: 61 year old female seen in consultation at the request of Ms. Beaty NP for multiple epidermal inclusion cyst on scalp.  She reports that she has had them for years and has had some of them removed.  Now she has 4 distinct lesions.  She reports that patient is uncomfortable and sometimes tender especially when coming her hair.  She experienced some discomfort but no drainage.  No fevers no chills.  No images are available. Did have a CBC and CMP was completely normal.  Past Medical History:  Diagnosis Date   Allergy    Anxiety    IBS (irritable bowel syndrome)    Trigger finger    Left Middle Finger 11/2016    Past Surgical History:  Procedure Laterality Date   ABDOMINAL HYSTERECTOMY  02/2016   Only cervix remains (done for prolapse)   AUGMENTATION MAMMAPLASTY Bilateral 2010   BREAST CYST ASPIRATION Left 2010   neg   BREAST ENHANCEMENT SURGERY  1998   CHOLECYSTECTOMY     COLONOSCOPY WITH PROPOFOL N/A 10/10/2019   Procedure: COLONOSCOPY WITH PROPOFOL;  Surgeon: Jonathon Bellows, MD;  Location: [facility] ENDOSCOPY;  Service: Gastroenterology;  Laterality: N/A;   TYMPANOPLASTY Left     Family History  Problem Relation Age of Onset   Lung cancer Mother    Hypertension Mother    Lupus Father    Breast cancer Neg Hx     Social History:  reports that she quit smoking about 28 years ago. Her smoking use included cigarettes. Her smokeless tobacco use includes chew. She reports current alcohol use. She reports that she does not use drugs.  Allergies: No Known Allergies  Medications reviewed.    ROS Full ROS performed and is otherwise negative other than what is stated in HPI   BP 116/74    Pulse 65    Temp 98.3 F (36.8 C) (Oral)    Ht 5' 3.5" (1.613 m)    Wt 122 lb 9.6 oz (55.6 kg)    SpO2 98%    BMI  21.38 kg/m   Physical Exam Vitals and nursing note reviewed. Exam conducted with a chaperone present.  Constitutional:      General: She is not in acute distress.    Appearance: Normal appearance. She is not ill-appearing.  Cardiovascular:     Rate and Rhythm: Normal rate and regular rhythm.     Heart sounds: No murmur heard.   No gallop.  Pulmonary:     Effort: Pulmonary effort is normal. No respiratory distress.     Breath sounds: Normal breath sounds. No stridor.  Abdominal:     General: Abdomen is flat. There is no distension.     Palpations: Abdomen is soft. There is no mass.     Tenderness: There is no abdominal tenderness. There is no guarding or rebound.     Hernia: No hernia is present.  Musculoskeletal:     Cervical back: Normal range of motion and neck supple. No rigidity.  Lymphadenopathy:     Cervical: No cervical adenopathy.  Skin:    General: Skin is warm and dry.     Capillary Refill: Capillary refill takes less than 2 seconds.     Comments: There are four epidermal inclusion cysts within the scalp in various parts.  They are  mobile they are nontender there is no evidence of infection.  Neurological:     General: No focal deficit present.     Mental Status: She is alert and oriented to person, place, and time.  Psychiatric:        Mood and Affect: Mood normal.        Behavior: Behavior normal.        Thought Content: Thought content normal.        Judgment: Judgment normal.     Assessment/Plan: 61 year old female with multiple symptomatic epidermal inclusion cyst on the scalp.  Patient wishes to have them removed.  Procedure discussed with the patient in detail.  Risks, benefits and possible implications including but not limited to: Bleeding, infection, pain and recurrences.  She understands and wished to proceed.  I do think that is reasonable to perform them in the office under local.    Greater than 50% of the 40 minutes  visit was spent in  counseling/coordination of care   Caroleen Hamman, MD [facility]

## 2021-04-28 ENCOUNTER — Ambulatory Visit (INDEPENDENT_AMBULATORY_CARE_PROVIDER_SITE_OTHER): Payer: 59 | Admitting: Surgery

## 2021-04-28 ENCOUNTER — Encounter: Payer: Self-pay | Admitting: Surgery

## 2021-04-28 ENCOUNTER — Other Ambulatory Visit: Payer: Self-pay

## 2021-04-28 ENCOUNTER — Other Ambulatory Visit: Payer: Self-pay | Admitting: Surgery

## 2021-04-28 VITALS — BP 138/85 | HR 61 | Temp 98.3°F | Ht 63.5 in | Wt 118.0 lb

## 2021-04-28 DIAGNOSIS — L720 Epidermal cyst: Secondary | ICD-10-CM

## 2021-04-28 NOTE — Patient Instructions (Addendum)
We have removed a Cyst in our office today.  You have sutures placed that will need to be removed in office.  You may shower tomorrow, do not scrub, wash gently, use very little shampoo, mostly water. Dry gently/pat your hair dry.   You may use Ibuprofen or Tylenol as needed for pain. Use an ice pack to the areas that are achy.   Avoid Strenuous activities that will make you sweat during the next 48 hours to avoid the glue coming off prematurely. Avoid activities that will place pressure to this area of the body for 1-2 weeks to avoid re-injury to incision site.  Please see your follow-up appointment provided. We will see you back in office to make sure this area is healed and to review the final pathology. If you have any questions or concerns prior to this appointment, call our office and speak with a nurse.    Excision of Skin Cysts or Lesions Excision of a skin lesion refers to the removal of a section of skin by making small cuts (incisions) in the skin. This procedure may be done to remove a cancerous (malignant) or noncancerous (benign) growth on the skin. It is typically done to treat or prevent cancer or infection. It may also be done to improve cosmetic appearance. The procedure may be done to remove: Cancerous growths, such as basal cell carcinoma, squamous cell carcinoma, or melanoma. Noncancerous growths, such as a cyst or lipoma. Growths, such as moles or skin tags, which may be removed for cosmetic reasons.  Various excision or surgical techniques may be used depending on your condition, the location of the lesion, and your overall health. Tell a health care provider about: Any allergies you have. All medicines you are taking, including vitamins, herbs, eye drops, creams, and over-the-counter medicines. Any problems you or family members have had with anesthetic medicines. Any blood disorders you have. Any surgeries you have had. Any medical conditions you have. Whether  you are pregnant or may be pregnant. What are the risks? Generally, this is a safe procedure. However, problems may occur, including: Bleeding. Infection. Scarring. Recurrence of the cyst, lipoma, or cancer. Changes in skin sensation or appearance, such as discoloration or swelling. Reaction to the anesthetics. Allergic reaction to surgical materials or ointments. Damage to nerves, blood vessels, muscles, or other structures. Continued pain.  What happens before the procedure? Ask your health care provider about: Changing or stopping your regular medicines. This is especially important if you are taking diabetes medicines or blood thinners. Taking medicines such as aspirin and ibuprofen. These medicines can thin your blood. Do not take these medicines before your procedure if your health care provider instructs you not to. You may be asked to take certain medicines. You may be asked to stop smoking. You may have an exam or testing. Plan to have someone take you home after the procedure. Plan to have someone help you with activities during recovery. What happens during the procedure? To reduce your risk of infection: Your health care team will wash or sanitize their hands. Your skin will be washed with soap. You will be given a medicine to numb the area (local anesthetic). One of the following excision techniques will be performed. At the end of any of these procedures, antibiotic ointment will be applied as needed. Each of the following techniques may vary among health care providers and hospitals. Complete Surgical Excision The area of skin that needs to be removed will be marked with a pen.  Using a small scalpel or scissors, the surgeon will gently cut around and under the lesion until it is completely removed. The lesion will be placed in a fluid and sent to the lab for examination. If necessary, bleeding will be controlled with a device that delivers heat (electrocautery). The  edges of the wound may be stitched (sutured) together, and a bandage (dressing) will be applied. This procedure may be performed to treat a cancerous growth or a noncancerous cyst or lesion. Excision of a Cyst The surgeon will make an incision on the cyst. The entire cyst will be removed through the incision. The incision may be closed with sutures. Shave Excision During shave excision, the surgeon will use a small blade or an electrically heated loop instrument to shave off the lesion. This may be done to remove a mole or a skin tag. The wound will usually be left to heal on its own without sutures. Punch Excision During punch excision, the surgeon will use a small tool that is like a cookie cutter or a hole punch to cut a circle shape out of the skin. The outer edges of the skin will be sutured together. This may be done to remove a mole or a scar or to perform a biopsy of the lesion. Mohs Micrographic Surgery During Mohs micrographic surgery, layers of the lesion will be removed with a scalpel or a loop instrument and will be examined right away under a microscope. Layers will be removed until all of the abnormal or cancerous tissue has been removed. This procedure is minimally invasive, and it ensures the best cosmetic outcome. It involves the removal of as little normal tissue as possible. Mohs is usually done to treat skin cancer, such as basal cell carcinoma or squamous cell carcinoma, particularly on the face and ears. Depending on the size of the surgical wound, it may be sutured closed. What happens after the procedure? Return to your normal activities as told by your health care provider. Talk with your health care provider to discuss any test results, treatment options, and if necessary, the need for more tests. This information is not intended to replace advice given to you by your health care provider. Make sure you discuss any questions you have with your health care provider. Document  Released: 06/08/2009 Document Revised: 08/20/2015 Document Reviewed: 04/30/2014 Elsevier Interactive Patient Education  [employer].

## 2021-05-03 ENCOUNTER — Encounter: Payer: Self-pay | Admitting: Surgery

## 2021-05-03 ENCOUNTER — Other Ambulatory Visit: Payer: Self-pay

## 2021-05-03 ENCOUNTER — Ambulatory Visit (INDEPENDENT_AMBULATORY_CARE_PROVIDER_SITE_OTHER): Payer: 59 | Admitting: Surgery

## 2021-05-03 VITALS — BP 130/73 | HR 71 | Temp 97.9°F | Ht 63.0 in | Wt 118.0 lb

## 2021-05-03 DIAGNOSIS — L720 Epidermal cyst: Secondary | ICD-10-CM

## 2021-05-03 DIAGNOSIS — Z09 Encounter for follow-up examination after completed treatment for conditions other than malignant neoplasm: Secondary | ICD-10-CM

## 2021-05-03 NOTE — Patient Instructions (Signed)
We removed the sutures today. You may resume normal hair washings.

## 2021-05-04 NOTE — Progress Notes (Signed)
Procedure Note Excision of scalp Right occipital EIC 2.1 cm Excision of scalp left occipital  EIC 2.2 cm Excision of scalp EIC right parietal  1.1 cm Excision of scalp EIC Left parietal 1.5 cm Intermediate closure of 4 scalp wounds measuring a total of   4.9cms   Anesthesia:lidocaine 1% w epi 20 cc total   EBL: minimal   Findings:  EIC x 4   After informed consent the patient was placed in the prone position and she was prepped and draped in usual sterile fashion.We clipped her hair appropriately. Attention was turned to the Right occipital lesion Lidocaine 1% with epinephrine was injected and incision was created with a 15 blade knife. EIC was excised after dissecting its capsule from the sub q tissue..  Using Metzenbaum scissors we remove the lesion and sent it for permanent pathology.  Hemostasis was obtained with pressure.  The wound was closed in a 2 layer fashion with 3-0 Vicryl for the subcu and 3-0 Nylon for the skin in a simple fashion. Next lesion was Left occipital Attention was turned to the Right occipital lesion Lidocaine 1% with epinephrine was injected and incision was created with a 15 blade knife. EIC was excised after dissecting its capsule from the sub q tissue..  Using Metzenbaum scissors we remove the lesion and sent it for permanent pathology.  Hemostasis was obtained with pressure.  The wound was closed in a 2 layer fashion with 3-0 Vicryl for the subcu and 3-0 Nylon for the skin in a simple fashion. Next lesion was right parietal Attention was turned to the Right occipital lesion Lidocaine 1% with epinephrine was injected and incision was created with a 15 blade knife. EIC was excised after dissecting its capsule from the sub q tissue..  Using Metzenbaum scissors we remove the lesion and sent it for permanent pathology.  Hemostasis was obtained with pressure.  The wound was closed in a 2 layer fashion with 3-0 Vicryl for the subcu and 3-0 Nylon for the skin in a simple  fashion. Next lesion was left parietal Attention was turned to the Right occipital lesion Lidocaine 1% with epinephrine was injected and incision was created with a 15 blade knife. EIC was excised after dissecting its capsule from the sub q tissue..  Using Metzenbaum scissors we remove the lesion and sent it for permanent pathology.  Hemostasis was obtained with pressure.  The wound was closed in a 2 layer fashion with 3-0 Vicryl for the subcu and 3-0 Nylon for the skin in a simple fashion. NO complications she tolerated procedure well

## 2021-05-05 NOTE — Progress Notes (Signed)
Jennifer Garrison is following after excision for epidermal inclusion cyst on the scalp.  She is doing very well without complications. Pathology discussed with her in detail  Her exam shows well-healed incisions.  I removed the stitches.  No complications  A/p doing well without complications.  Return to clinic prn

## 2021-05-10 ENCOUNTER — Encounter: Payer: 59 | Admitting: Internal Medicine

## 2021-06-28 ENCOUNTER — Ambulatory Visit (INDEPENDENT_AMBULATORY_CARE_PROVIDER_SITE_OTHER): Payer: 59 | Admitting: Internal Medicine

## 2021-06-28 ENCOUNTER — Encounter: Payer: Self-pay | Admitting: Internal Medicine

## 2021-06-28 VITALS — BP 114/78 | HR 60 | Temp 97.3°F | Ht 64.0 in | Wt 118.0 lb

## 2021-06-28 DIAGNOSIS — Z13820 Encounter for screening for osteoporosis: Secondary | ICD-10-CM | POA: Diagnosis not present

## 2021-06-28 DIAGNOSIS — Z0001 Encounter for general adult medical examination with abnormal findings: Secondary | ICD-10-CM | POA: Diagnosis not present

## 2021-06-28 DIAGNOSIS — K582 Mixed irritable bowel syndrome: Secondary | ICD-10-CM | POA: Diagnosis not present

## 2021-06-28 DIAGNOSIS — F419 Anxiety disorder, unspecified: Secondary | ICD-10-CM

## 2021-06-28 DIAGNOSIS — Z1231 Encounter for screening mammogram for malignant neoplasm of breast: Secondary | ICD-10-CM | POA: Diagnosis not present

## 2021-06-28 DIAGNOSIS — F32A Depression, unspecified: Secondary | ICD-10-CM

## 2021-06-28 NOTE — Patient Instructions (Signed)
Health Maintenance for Postmenopausal Women ?Menopause is a normal process in which your ability to get pregnant comes to an end. This process happens slowly over many months or years, usually between the ages of 48 and 55. Menopause is complete when you have missed your menstrual period for 12 months. ?It is important to talk with your health care provider about some of the most common conditions that affect women after menopause (postmenopausal women). These include heart disease, cancer, and bone loss (osteoporosis). Adopting a healthy lifestyle and getting preventive care can help to promote your health and wellness. The actions you take can also lower your chances of developing some of these common conditions. ?What are the signs and symptoms of menopause? ?During menopause, you may have the following symptoms: ?Hot flashes. These can be moderate or severe. ?Night sweats. ?Decrease in sex drive. ?Mood swings. ?Headaches. ?Tiredness (fatigue). ?Irritability. ?Memory problems. ?Problems falling asleep or staying asleep. ?Talk with your health care provider about treatment options for your symptoms. ?Do I need hormone replacement therapy? ?Hormone replacement therapy is effective in treating symptoms that are caused by menopause, such as hot flashes and night sweats. ?Hormone replacement carries certain risks, especially as you become older. If you are thinking about using estrogen or estrogen with progestin, discuss the benefits and risks with your health care provider. ?How can I reduce my risk for heart disease and stroke? ?The risk of heart disease, heart attack, and stroke increases as you age. One of the causes may be a change in the body's hormones during menopause. This can affect how your body uses dietary fats, triglycerides, and cholesterol. Heart attack and stroke are medical emergencies. There are many things that you can do to help prevent heart disease and stroke. ?Watch your blood pressure ?High  blood pressure causes heart disease and increases the risk of stroke. This is more likely to develop in people who have high blood pressure readings or are overweight. ?Have your blood pressure checked: ?Every 3-5 years if you are 18-39 years of age. ?Every year if you are 40 years old or older. ?Eat a healthy diet ? ?Eat a diet that includes plenty of vegetables, fruits, low-fat dairy products, and lean protein. ?Do not eat a lot of foods that are high in solid fats, added sugars, or sodium. ?Get regular exercise ?Get regular exercise. This is one of the most important things you can do for your health. Most adults should: ?Try to exercise for at least 150 minutes each week. The exercise should increase your heart rate and make you sweat (moderate-intensity exercise). ?Try to do strengthening exercises at least twice each week. Do these in addition to the moderate-intensity exercise. ?Spend less time sitting. Even light physical activity can be beneficial. ?Other tips ?Work with your health care provider to achieve or maintain a healthy weight. ?Do not use any products that contain nicotine or tobacco. These products include cigarettes, chewing tobacco, and vaping devices, such as e-cigarettes. If you need help quitting, ask your health care provider. ?Know your numbers. Ask your health care provider to check your cholesterol and your blood sugar (glucose). Continue to have your blood tested as directed by your health care provider. ?Do I need screening for cancer? ?Depending on your health history and family history, you may need to have cancer screenings at different stages of your life. This may include screening for: ?Breast cancer. ?Cervical cancer. ?Lung cancer. ?Colorectal cancer. ?What is my risk for osteoporosis? ?After menopause, you may be   at increased risk for osteoporosis. Osteoporosis is a condition in which bone destruction happens more quickly than new bone creation. To help prevent osteoporosis or  the bone fractures that can happen because of osteoporosis, you may take the following actions: ?If you are 19-50 years old, get at least 1,000 mg of calcium and at least 600 international units (IU) of vitamin D per day. ?If you are older than age 50 but younger than age 70, get at least 1,200 mg of calcium and at least 600 international units (IU) of vitamin D per day. ?If you are older than age 70, get at least 1,200 mg of calcium and at least 800 international units (IU) of vitamin D per day. ?Smoking and drinking excessive alcohol increase the risk of osteoporosis. Eat foods that are rich in calcium and vitamin D, and do weight-bearing exercises several times each week as directed by your health care provider. ?How does menopause affect my mental health? ?Depression may occur at any age, but it is more common as you become older. Common symptoms of depression include: ?Feeling depressed. ?Changes in sleep patterns. ?Changes in appetite or eating patterns. ?Feeling an overall lack of motivation or enjoyment of activities that you previously enjoyed. ?Frequent crying spells. ?Talk with your health care provider if you think that you are experiencing any of these symptoms. ?General instructions ?See your health care provider for regular wellness exams and vaccines. This may include: ?Scheduling regular health, dental, and eye exams. ?Getting and maintaining your vaccines. These include: ?Influenza vaccine. Get this vaccine each year before the flu season begins. ?Pneumonia vaccine. ?Shingles vaccine. ?Tetanus, diphtheria, and pertussis (Tdap) booster vaccine. ?Your health care provider may also recommend other immunizations. ?Tell your health care provider if you have ever been abused or do not feel safe at home. ?Summary ?Menopause is a normal process in which your ability to get pregnant comes to an end. ?This condition causes hot flashes, night sweats, decreased interest in sex, mood swings, headaches, or lack  of sleep. ?Treatment for this condition may include hormone replacement therapy. ?Take actions to keep yourself healthy, including exercising regularly, eating a healthy diet, watching your weight, and checking your blood pressure and blood sugar levels. ?Get screened for cancer and depression. Make sure that you are up to date with all your vaccines. ?This information is not intended to replace advice given to you by your health care provider. Make sure you discuss any questions you have with your health care provider. ?Document Revised: 08/03/2020 Document Reviewed: 08/03/2020 ?Elsevier Patient Education ? 2022 [employer] Inc[employer] ? ?

## 2021-06-28 NOTE — Progress Notes (Signed)
? ?Subjective:  ? ? Patient ID: Jennifer Garrison, female    DOB: 19-Jun-1960, 61 y.o.   MRN: [id number] ? ?HPI ? ?Patient presents to clinic today for her annual exam. ? ?Flu: 12/2017 ?Tetanus: 08/2018 ?COVID: Pfizer x 2 ?Shingrix: never ?Pap smear: 08/2019 ?Mammogram: 09/2020 ?Bone density: Never ?Colon screening: 09/2019 ?Vision screening: annually ?Dentist: biannually ? ?Diet: She does eat meat. She consumes some fruits and veggies. She tries to void fried foods. She drinks mostly water, orange juice. ?Exercise: Walking ? ?Review of Systems ? ?   ?Past Medical History:  ?Diagnosis Date  ? Allergy   ? Anxiety   ? IBS (irritable bowel syndrome)   ? Trigger finger   ? Left Middle Finger 11/2016  ? ? ?Current Outpatient Medications  ?Medication Sig Dispense Refill  ? buPROPion (WELLBUTRIN SR) 150 MG 12 hr tablet Take 1 tablet (150 mg total) by mouth 2 (two) times daily. 180 tablet 3  ? ?No current facility-administered medications for this visit.  ? ? ?No Known Allergies ? ?Family History  ?Problem Relation Age of Onset  ? Lung cancer Mother   ? Hypertension Mother   ? Lupus Father   ? Breast cancer Neg Hx   ? ? ?Social History  ? ?Socioeconomic History  ? Marital status: Married  ?  Spouse name: Not on file  ? Number of children: 1  ? Years of education: Not on file  ? Highest education level: Not on file  ?Occupational History  ? Not on file  ?Tobacco Use  ? Smoking status: Former  ?  Types: Cigarettes  ?  Quit date: 09/09/1992  ?  Years since quitting: 28.8  ? Smokeless tobacco: Current  ?  Types: Chew  ? Tobacco comments:  ?  Chewing tobacco   ?Vaping Use  ? Vaping Use: Some days  ?Substance and Sexual Activity  ? Alcohol use: Yes  ?  Comment: socially   ? Drug use: No  ? Sexual activity: Never  ?Other Topics Concern  ? Not on file  ?Social History Narrative  ? Not on file  ? ?Social Determinants of Health  ? ?Financial Resource Strain: Not on file  ?Food Insecurity: Not on file  ?Transportation Needs: Not on file  ?Physical  Activity: Not on file  ?Stress: Not on file  ?Social Connections: Not on file  ?Intimate Partner Violence: Not on file  ? ? ? ?Constitutional: Denies fever, malaise, fatigue, headache or abrupt weight changes.  ?HEENT: Denies eye pain, eye redness, ear pain, ringing in the ears, wax buildup, runny nose, nasal congestion, bloody nose, or sore throat. ?Respiratory: Denies difficulty breathing, shortness of breath, cough or sputum production.   ?Cardiovascular: Denies chest pain, chest tightness, palpitations or swelling in the hands or feet.  ?Gastrointestinal: Patient reports intermittent constipation and diarrhea.  Denies abdominal pain, bloating, or blood in the stool.  ?GU: Denies urgency, frequency, pain with urination, burning sensation, blood in urine, odor or discharge. ?Musculoskeletal: Patient reports intermittent joint pains.  Denies decrease in range of motion, difficulty with gait, muscle pain or joint swelling.  ?Skin: Denies redness, rashes, lesions or ulcercations.  ?Neurological: Denies dizziness, difficulty with memory, difficulty with speech or problems with balance and coordination.  ?Psych: Patient has a history of anxiety and depression.  Denies SI/HI. ? ?No other specific complaints in a complete review of systems (except as listed in HPI above). ? ?Objective:  ? Physical Exam ? ?BP 114/78 (BP Location: Right Arm, Patient Position:  Sitting, Cuff Size: Normal)   Pulse 60   Temp (!) 97.3 ?F (36.3 ?C) (Temporal)   Ht 5' 4"  (1.626 m)   Wt 118 lb (53.5 kg)   SpO2 100%   BMI 20.25 kg/m?  ? ?Wt Readings from Last 3 Encounters:  ?05/03/21 118 lb (53.5 kg)  ?04/28/21 118 lb (53.5 kg)  ?04/12/21 122 lb 9.6 oz (55.6 kg)  ? ? ?General: Appears her stated age, well developed, well nourished in NAD. ?Skin: Warm, dry and intact.  ?HEENT: Head: normal shape and size; Eyes: sclera white and EOMs intact;  ?Neck:  Neck supple, trachea midline. No masses, lumps or thyromegaly present.  ?Cardiovascular: Normal  rate and rhythm. S1,S2 noted.  No murmur, rubs or gallops noted. No JVD or BLE edema. No carotid bruits noted. ?Pulmonary/Chest: Normal effort and positive vesicular breath sounds. No respiratory distress. No wheezes, rales or ronchi noted.  ?Abdomen: Soft and nontender. Normal bowel sounds. No distention or masses noted. Liver, spleen and kidneys non palpable. ?Musculoskeletal: Strength 5/5 BUE/BLE.  No difficulty with gait.  ?Neurological: Alert and oriented. Cranial nerves II-XII grossly intact. Coordination normal.  ?Psychiatric: Mood and affect normal. Behavior is normal. Judgment and thought content normal.  ? ? ?BMET ?   ?Component Value Date/Time  ? NA 138 09/17/2019 1136  ? K 4.3 09/17/2019 1136  ? CL 102 09/17/2019 1136  ? CO2 24 09/17/2019 1136  ? GLUCOSE 90 09/17/2019 1136  ? BUN 16 09/17/2019 1136  ? CREATININE 0.89 09/17/2019 1136  ? CALCIUM 10.0 09/17/2019 1136  ? GFRNONAA 71 09/17/2019 1136  ? GFRAA 81 09/17/2019 1136  ? ? ?Lipid Panel  ?   ?Component Value Date/Time  ? CHOL 165 09/17/2019 1136  ? TRIG 84 09/17/2019 1136  ? HDL 51 09/17/2019 1136  ? CHOLHDL 3.2 09/17/2019 1136  ? [place] 98 09/17/2019 1136  ? ? ?CBC ?   ?Component Value Date/Time  ? WBC 6.5 09/17/2019 1136  ? RBC 5.03 09/17/2019 1136  ? HGB 14.9 09/17/2019 1136  ? HCT 43.8 09/17/2019 1136  ? PLT 250 09/17/2019 1136  ? MCV 87 09/17/2019 1136  ? MCH 29.6 09/17/2019 1136  ? MCHC 34.0 09/17/2019 1136  ? RDW 11.9 09/17/2019 1136  ? LYMPHSABS 2.1 09/17/2019 1136  ? EOSABS 0.2 09/17/2019 1136  ? BASOSABS 0.1 09/17/2019 1136  ? ? ?Hgb A1C ?No results found for: HGBA1C ? ? ? ? ? ?   ?Assessment & Plan:  ? ?Preventative Health Maintenance: ? ?Encouraged her to get a flu shot in the fall ?Tetanus UTD ?Encouraged her to get her COVID booster, advised her to send me a copy of her vaccination card so I can add this to her immunization list ?Discussed Shingrix vaccine, she will check coverage with her insurance company ?Pap smear UTD ?Mammogram due  09/2020 ?Bone density ordered-she will call to schedule ?Colon screening UTD ?Encouraged her to consume a balanced diet and exercise regimen ?Advised her to see an eye doctor and dentist annually ?We will check CBC, c-Met, lipid profile today ? ?RTC in 1 year, for your annual exam ? ?Webb Silversmith, NP ? ?

## 2021-06-28 NOTE — Assessment & Plan Note (Signed)
Diet controlled.  

## 2021-06-28 NOTE — Assessment & Plan Note (Signed)
Stable on Wellbutrin ?Support offered ?

## 2021-06-29 LAB — CBC
HCT: 43.1 % (ref 35.0–45.0)
Hemoglobin: 14 g/dL (ref 11.7–15.5)
MCH: 28.9 pg (ref 27.0–33.0)
MCHC: 32.5 g/dL (ref 32.0–36.0)
MCV: 88.9 fL (ref 80.0–100.0)
MPV: 11.2 fL (ref 7.5–12.5)
Platelets: 231 10*3/uL (ref 140–400)
RBC: 4.85 10*6/uL (ref 3.80–5.10)
RDW: 12.4 % (ref 11.0–15.0)
WBC: 6.6 10*3/uL (ref 3.8–10.8)

## 2021-06-29 LAB — LIPID PANEL
Cholesterol: 162 mg/dL (ref <200)
HDL: 59 mg/dL (ref >=50)
LDL Cholesterol (Calc): 88 mg/dL (calc)
Non-HDL Cholesterol (Calc): 103 mg/dL (calc) (ref <130)
Total CHOL/HDL Ratio: 2.7 (calc) (ref <5.0)
Triglycerides: 60 mg/dL (ref <150)

## 2021-06-29 LAB — COMPLETE METABOLIC PANEL WITH GFR
AG Ratio: 1.7 (calc) (ref 1.0–2.5)
ALT: 11 U/L (ref 6–29)
AST: 16 U/L (ref 10–35)
Albumin: 4.1 g/dL (ref 3.6–5.1)
Alkaline phosphatase (APISO): 69 U/L (ref 37–153)
BUN: 16 mg/dL (ref 7–25)
CO2: 27 mmol/L (ref 20–32)
Calcium: 9.5 mg/dL (ref 8.6–10.4)
Chloride: 106 mmol/L (ref 98–110)
Creat: 0.88 mg/dL (ref 0.50–1.05)
Globulin: 2.4 g/dL (calc) (ref 1.9–3.7)
Glucose, Bld: 87 mg/dL (ref 65–99)
Potassium: 4.6 mmol/L (ref 3.5–5.3)
Sodium: 142 mmol/L (ref 135–146)
Total Bilirubin: 0.6 mg/dL (ref 0.2–1.2)
Total Protein: 6.5 g/dL (ref 6.1–8.1)
eGFR: 75 mL/min/{1.73_m2} (ref >=60)

## 2021-10-04 ENCOUNTER — Ambulatory Visit (INDEPENDENT_AMBULATORY_CARE_PROVIDER_SITE_OTHER): Payer: 59

## 2021-10-04 ENCOUNTER — Ambulatory Visit
Admission: RE | Admit: 2021-10-04 | Discharge: 2021-10-04 | Disposition: A | Discharge status: Home / Self Care | Payer: 59 | Source: Ambulatory Visit | Attending: Emergency Medicine | Admitting: Emergency Medicine

## 2021-10-04 VITALS — BP 130/83 | HR 87 | Temp 98.9°F | Resp 18

## 2021-10-04 DIAGNOSIS — S99922A Unspecified injury of left foot, initial encounter: Secondary | ICD-10-CM

## 2021-10-04 DIAGNOSIS — S90122A Contusion of left lesser toe(s) without damage to nail, initial encounter: Secondary | ICD-10-CM | POA: Diagnosis not present

## 2021-10-04 MED ORDER — IBUPROFEN 600 MG PO TABS: 600.0000 mg | ORAL_TABLET | Freq: Four times a day (QID) | ORAL | 0 refills | Status: AC | PRN

## 2021-10-04 NOTE — Discharge Instructions (Addendum)
Take the ibuprofen as prescribed.  Rest and elevate your foot.  Apply ice packs 2-3 times a day for up to 20 minutes each.  Buddy tape your toes as needed for comfort.    Follow up with an orthopedist if your symptoms are not improving.

## 2021-10-04 NOTE — ED Provider Notes (Signed)
[name]    CSN: [id number] Arrival date & time: 10/04/21  1242      History   Chief Complaint Chief Complaint  Patient presents with   Toe Injury    I rammed my pinkie toe quite hard into the [place]el of a shower stall on Wednesday 7/5. The toe actually bent sideways. It is still in severe pain, so I felt it necessary to schedule a visit. - Entered by patient    HPI Jennifer Garrison is a 61 y.o. female.  Patient presents with pain, swelling, bruising of her left fifth toe x5 days.  She hit it on a shower stall.  Treatment at home with Tylenol.  She denies numbness, weakness, paresthesias, open wounds, or other symptoms.  Her medical history includes IBS, anxiety, depression.  The history is provided by the patient and medical records.    Past Medical History:  Diagnosis Date   Allergy    Anxiety    IBS (irritable bowel syndrome)    Trigger finger    Left Middle Finger 11/2016    Patient Active Problem List   Diagnosis Date Noted   Anxiety and depression 05/01/2017   Irritable bowel syndrome with both constipation and diarrhea 02/21/2017    Past Surgical History:  Procedure Laterality Date   ABDOMINAL HYSTERECTOMY  02/2016   Only cervix remains (done for prolapse)   AUGMENTATION MAMMAPLASTY Bilateral 2010   BREAST CYST ASPIRATION Left 2010   neg   BREAST ENHANCEMENT SURGERY  1998   CHOLECYSTECTOMY     COLONOSCOPY WITH PROPOFOL N/A 10/10/2019   Procedure: COLONOSCOPY WITH PROPOFOL;  Surgeon: Jonathon Bellows, MD;  Location: [facility] ENDOSCOPY;  Service: Gastroenterology;  Laterality: N/A;   TYMPANOPLASTY Left     OB History   No obstetric history on file.      Home Medications    Prior to Admission medications   Medication Sig Start Date End Date Taking? Authorizing Provider  ibuprofen (ADVIL) 600 MG tablet Take 1 tablet (600 mg total) by mouth every 6 (six) hours as needed. 10/04/21  Yes Jennifer Balloon, NP  buPROPion (WELLBUTRIN SR) 150 MG 12 hr tablet Take 1  tablet (150 mg total) by mouth 2 (two) times daily. 11/12/20   Jearld Fenton, NP    Family History Family History  Problem Relation Age of Onset   Lung cancer Mother    Hypertension Mother    Lupus Father    Breast cancer Neg Hx     Social History Social History   Tobacco Use   Smoking status: Former    Types: Cigarettes    Quit date: 09/09/1992    Years since quitting: 29.0   Smokeless tobacco: Current    Types: Chew   Tobacco comments:    Chewing tobacco   Vaping Use   Vaping Use: Former  Substance Use Topics   Alcohol use: Yes    Comment: socially    Drug use: No     Allergies   Patient has no known allergies.   Review of Systems Review of Systems  Constitutional:  Negative for chills and fever.  Musculoskeletal:  Positive for arthralgias and joint swelling. Negative for gait problem.  Skin:  Negative for color change, rash and wound.  Neurological:  Negative for weakness and numbness.  All other systems reviewed and are negative.    Physical Exam Triage Vital Signs ED Triage Vitals  Enc Vitals Group     BP 10/04/21 1255 130/83  Pulse Rate 10/04/21 1255 87     Resp 10/04/21 1255 18     Temp 10/04/21 1255 98.9 F (37.2 C)     Temp src --      SpO2 10/04/21 1255 97 %     Weight --      Height --      Head Circumference --      Peak Flow --      Pain Score 10/04/21 1257 6     Pain Loc --      Pain Edu? --      Excl. in [place]? --    No data found.  Updated Vital Signs BP 130/83   Pulse 87   Temp 98.9 F (37.2 C)   Resp 18   SpO2 97%   Visual Acuity Right Eye Distance:   Left Eye Distance:   Bilateral Distance:    Right Eye Near:   Left Eye Near:    Bilateral Near:     Physical Exam Vitals and nursing note reviewed.  Constitutional:      General: She is not in acute distress.    Appearance: Normal appearance. She is well-developed. She is not ill-appearing.  HENT:     Mouth/Throat:     Mouth: Mucous membranes are moist.   Cardiovascular:     Rate and Rhythm: Normal rate and regular rhythm.     Heart sounds: Normal heart sounds.  Pulmonary:     Effort: Pulmonary effort is normal. No respiratory distress.     Breath sounds: Normal breath sounds.  Musculoskeletal:        General: Swelling and tenderness present. No deformity. Normal range of motion.     Cervical back: Neck supple.       Feet:  Skin:    General: Skin is warm and dry.     Capillary Refill: Capillary refill takes less than 2 seconds.     Findings: Bruising present. No erythema, lesion or rash.  Neurological:     General: No focal deficit present.     Mental Status: She is alert and oriented to person, place, and time.     Sensory: No sensory deficit.     Motor: No weakness.     Gait: Gait normal.  Psychiatric:        Mood and Affect: Mood normal.        Behavior: Behavior normal.      UC Treatments / Results  Labs (all labs ordered are listed, but only abnormal results are displayed) Labs Reviewed - No data to display  EKG   Radiology DG Foot Complete Left  Result Date: 10/04/2021 CLINICAL DATA:  Trauma, pain left fifth toe EXAM: LEFT FOOT - COMPLETE 3+ VIEW COMPARISON:  None Available. FINDINGS: No displaced fracture or dislocation is seen. There is soft tissue swelling in the fifth toe and adjacent to the fifth metatarsophalangeal joint. Small sclerotic density in the distal shaft of the metatarsal may suggest benign bone island. No radiopaque foreign bodies are seen. IMPRESSION: No displaced fracture or dislocation is seen in left foot. Electronically Signed   By: Elmer Picker M.D.   On: 10/04/2021 13:10    Procedures Procedures (including critical care time)  Medications Ordered in UC Medications - No data to display  Initial Impression / Assessment and Plan / UC Course  I have reviewed the triage vital signs and the nursing notes.  Pertinent labs & imaging results that were available during my care of the  patient were reviewed by me and considered in my medical decision making (see chart for details).    Contusion of left fifth toe.  X-ray negative.  Treating with buddy taping toes, rest, elevation, ice packs, ibuprofen.  Education provided on foot contusion.  Instructed patient to follow-up with orthopedics if her symptoms are not improving.  She agrees to plan of care.  Final Clinical Impressions(s) / UC Diagnoses   Final diagnoses:  Contusion of lesser toe of left foot without damage to nail, initial encounter     Discharge Instructions      Take the ibuprofen as prescribed.  Rest and elevate your foot.  Apply ice packs 2-3 times a day for up to 20 minutes each.  Buddy tape your toes as needed for comfort.    Follow up with an orthopedist if your symptoms are not improving.        ED Prescriptions     Medication Sig Dispense Auth. Provider   ibuprofen (ADVIL) 600 MG tablet Take 1 tablet (600 mg total) by mouth every 6 (six) hours as needed. 30 tablet Jennifer Balloon, NP      PDMP not reviewed this encounter.   Jennifer Balloon, NP 10/04/21 1346

## 2021-10-04 NOTE — ED Triage Notes (Signed)
Patient presents to Urgent Care with complaints of left pinky toe injury 07/05. States it is bent sideways. Treating pain with tylenol. Concerned with fracture.

## 2021-10-26 ENCOUNTER — Ambulatory Visit
Admission: RE | Admit: 2021-10-26 | Discharge: 2021-10-26 | Disposition: A | Discharge status: Home / Self Care | Payer: 59 | Source: Ambulatory Visit | Attending: Internal Medicine | Admitting: Internal Medicine

## 2021-10-26 ENCOUNTER — Telehealth: Payer: Self-pay

## 2021-10-26 DIAGNOSIS — Z13820 Encounter for screening for osteoporosis: Secondary | ICD-10-CM | POA: Insufficient documentation

## 2021-10-26 DIAGNOSIS — Z1231 Encounter for screening mammogram for malignant neoplasm of breast: Secondary | ICD-10-CM | POA: Diagnosis not present

## 2021-10-26 DIAGNOSIS — M810 Age-related osteoporosis without current pathological fracture: Secondary | ICD-10-CM

## 2021-10-26 NOTE — Telephone Encounter (Unsigned)
Copied from [place]. Topic: General - Other >> Oct 26, 2021  1:35 PM Ludger Nutting wrote: [name] - Pt said she saw results from today's imaging appt and wants to discuss results and medication options. Advised patient that it didn't look like the results had been reviewed yet and [facility] and/or care team would reach out once reviewed.

## 2021-10-27 NOTE — Addendum Note (Signed)
Addended by: Ashley Royalty E on: 10/27/2021 10:42 AM   Modules accepted: Orders

## 2021-10-27 NOTE — Addendum Note (Signed)
Addended by: Jearld Fenton on: 10/27/2021 10:44 AM   Modules accepted: Orders

## 2021-10-27 NOTE — Telephone Encounter (Signed)
I released results to her MyChart stating that she has osteoporosis.  My recommendation is that she start vitamin D and calcium OTC if she is not currently taking as well as get 30 minutes of weightbearing exercise daily.  I also recommended Prolia injections for treatment of osteoporosis.  If this is something she would like to do, I would refer her to endocrinology and they would set this up for her.

## 2021-10-27 NOTE — Telephone Encounter (Signed)
Pt advised.  She agreed to the referral.   Thanks,   -Mickel Baas

## 2021-11-02 ENCOUNTER — Telehealth: Payer: 59 | Admitting: Physician Assistant

## 2021-11-02 ENCOUNTER — Ambulatory Visit: Payer: Self-pay

## 2021-11-02 DIAGNOSIS — L237 Allergic contact dermatitis due to plants, except food: Secondary | ICD-10-CM | POA: Diagnosis not present

## 2021-11-02 MED ORDER — TRIAMCINOLONE ACETONIDE 0.1 % EX CREA
1.0000 | TOPICAL_CREAM | Freq: Two times a day (BID) | CUTANEOUS | 0 refills | Status: DC
Start: 2021-11-02 — End: 2022-06-09

## 2021-11-02 MED ORDER — PREDNISONE 10 MG PO TABS: ORAL_TABLET | ORAL | 0 refills | Status: AC

## 2021-11-02 NOTE — Patient Instructions (Signed)
Drue Novel, thank you for joining Leeanne Rio, PA-C for today's virtual visit.  While this provider is not your primary care provider (PCP), if your PCP is located in our provider database this encounter information will be shared with them immediately following your visit.  Consent: (Patient) Jennifer Garrison provided verbal consent for this virtual visit at the beginning of the encounter.  Current Medications:  Current Outpatient Medications:    buPROPion (WELLBUTRIN SR) 150 MG 12 hr tablet, Take 1 tablet (150 mg total) by mouth 2 (two) times daily., Disp: 180 tablet, Rfl: 3   ibuprofen (ADVIL) 600 MG tablet, Take 1 tablet (600 mg total) by mouth every 6 (six) hours as needed., Disp: 30 tablet, Rfl: 0   Medications ordered in this encounter:  No orders of the defined types were placed in this encounter.    *If you need refills on other medications prior to your next appointment, please contact your pharmacy*  Follow-Up: Call back or seek an in-person evaluation if the symptoms worsen or if the condition fails to improve as anticipated.  Other Instructions Poison Ivy Dermatitis Poison ivy dermatitis is redness and soreness of the skin caused by chemicals in the leaves of the poison ivy plant. You may have very bad itching, swelling, a rash, and blisters. What are the causes? Touching a poison ivy plant. Touching something that has the chemical on it. This may include animals or objects that have come in contact with the plant. What increases the risk? Going outdoors often in wooded or [place] areas. Going outdoors without wearing protective clothing, such as closed shoes, long pants, and a long-sleeved shirt. What are the signs or symptoms?  Skin redness. Very bad itching. A rash that often includes bumps and blisters. The rash usually appears 48 hours after exposure, if you have been exposed before. If this is the first time you have been exposed, the rash may not appear  until a week after exposure. Swelling. This may occur if the reaction is very bad. Symptoms usually last for 1-2 weeks. The first time you develop this condition, symptoms may last 3-4 weeks. How is this treated? This condition may be treated with: Hydrocortisone cream or calamine lotion to relieve itching. Oatmeal baths to soothe the skin. Medicines, such as over-the-counter antihistamine tablets. Oral steroid medicine for more severe reactions. Follow these instructions at home: Medicines Take or apply over-the-counter and prescription medicines only as told by your doctor. Use hydrocortisone cream or calamine lotion as needed to help with itching. General instructions Do not scratch or rub your skin. Put a cold, wet cloth (cold compress) on the affected areas or take baths in cool water. This will help with itching. Avoid hot baths and showers. Take oatmeal baths as needed. Use colloidal oatmeal. You can get this at a pharmacy or grocery store. Follow the instructions on the package. While you have the rash, wash your clothes right after you wear them. Keep all follow-up visits as told by your health care provider. This is important. How is this prevented?  Know what poison ivy looks like, so you can avoid it. This plant has three leaves with flowering branches on a single stem. The leaves are glossy. The leaves have uneven edges that come to a point at the front. If you touch poison ivy, wash your skin with soap and water right away. Be sure to wash under your fingernails. When hiking or camping, wear long pants, a long-sleeved shirt, tall socks, and hiking  boots. You can also use a lotion on your skin that helps to prevent contact with poison ivy. If you think that your clothes or outdoor gear came in contact with poison ivy, rinse them off with a garden hose before you bring them inside your house. When doing yard work or gardening, wear gloves, long sleeves, long pants, and boots.  Wash your garden tools and gloves if they come in contact with poison ivy. If you think that your pet has come into contact with poison ivy, wash him or her with pet shampoo and water. Make sure to wear gloves while washing your pet. Contact a doctor if: You have open sores in the rash area. You have more redness, swelling, or pain in the rash area. You have redness that spreads beyond the rash area. You have fluid, blood, or pus coming from the rash area. You have a fever. You have a rash over a large area of your body. You have a rash on your eyes, mouth, or genitals. Your rash does not get better after a few weeks. Get help right away if: Your face swells or your eyes swell shut. You have trouble breathing. You have trouble swallowing. These symptoms may be an emergency. Do not wait to see if the symptoms will go away. Get medical help right away. Call your local emergency services (911 in the [place] Do not drive yourself to the hospital. Summary Poison ivy dermatitis is redness and soreness of the skin caused by chemicals in the leaves of the poison ivy plant. You may have skin redness, very bad itching, swelling, and a rash. Do not scratch or rub your skin. Take or apply over-the-counter and prescription medicines only as told by your doctor. This information is not intended to replace advice given to you by your health care provider. Make sure you discuss any questions you have with your health care provider. Document Revised: 12/28/2020 Document Reviewed: 12/28/2020 Elsevier Patient Education  [place].    If you have been instructed to have an in-person evaluation today at a local Urgent Care facility, please use the link below. It will take you to a list of all of our available [facility], including address, phone number and hours of operation. Please do not delay care.  [facility]  If you or a family member do not have a primary care  provider, use the link below to schedule a visit and establish care. When you choose a [facility] primary care physician or advanced practice provider, you gain a long-term partner in health. Find a Primary Care Provider  Learn more about [facility] in-office and virtual care options: [facility]

## 2021-11-02 NOTE — Telephone Encounter (Signed)
I unfortunately cannot send her medication without seeing her.  Recommend virtual visit or urgent care

## 2021-11-02 NOTE — Telephone Encounter (Signed)
Patient called, left VM to return the call to the office to discuss symptoms with a nurse.  Summary: poison ivy   Pt has poison ivy all over legs and feet / pt asked for RX for prednisone / please advise

## 2021-11-02 NOTE — Progress Notes (Signed)
Virtual Visit Consent   Arhianna Ebey, you are scheduled for a virtual visit with a [facility] provider today. Just as with appointments in the office, your consent must be obtained to participate. Your consent will be active for this visit and any virtual visit you may have with one of our providers in the next 365 days. If you have a MyChart account, a copy of this consent can be sent to you electronically.  As this is a virtual visit, video technology does not allow for your provider to perform a traditional examination. This may limit your provider's ability to fully assess your condition. If your provider identifies any concerns that need to be evaluated in person or the need to arrange testing (such as labs, EKG, etc.), we will make arrangements to do so. Although advances in technology are sophisticated, we cannot ensure that it will always work on either your end or our end. If the connection with a video visit is poor, the visit may have to be switched to a telephone visit. With either a video or telephone visit, we are not always able to ensure that we have a secure connection.  By engaging in this virtual visit, you consent to the provision of healthcare and authorize for your insurance to be billed (if applicable) for the services provided during this visit. Depending on your insurance coverage, you may receive a charge related to this service.  I need to obtain your verbal consent now. Are you willing to proceed with your visit today? Jhayla Podgorski has provided verbal consent on 11/02/2021 for a virtual visit (video or telephone). Leeanne Rio, [place]  Date: 11/02/2021 5:36 PM  Virtual Visit via Video Note   I, Leeanne Rio, connected with  Kimberlyn Quiocho  ([id number], 62-Jan-1962) on 11/02/21 at  5:30 PM EDT by a video-enabled telemedicine application and verified that I am speaking with the correct person using two identifiers.  Location: Patient: Virtual Visit Location Patient:  Home Provider: Virtual Visit Location Provider: Home Office   I discussed the limitations of evaluation and management by telemedicine and the availability of in person appointments. The patient expressed understanding and agreed to proceed.    History of Present Illness: Jennifer Garrison is a 61 y.o. who identifies as a female who was assigned female at birth, and is being seen today for possible poison ivy rash. Notes rash starting < 1 week ago after going hiking in the woods with her dogs. Was wearing low cut socks. Started with itchy rash of her R ankle then spreading to bilateral lower extremities. Notes it now involves the thighs bilaterally and seems to continue to worsen. Denies fever, chills. Is apply OTC Calamine lotion to the areas with some relief. Notes she has required prednisone in the past to resolve these rashes.    HPI: HPI  Problems:  Patient Active Problem List   Diagnosis Date Noted   Anxiety and depression 05/01/2017   Irritable bowel syndrome with both constipation and diarrhea 02/21/2017    Allergies: No Known Allergies Medications:  Current Outpatient Medications:    predniSONE (DELTASONE) 10 MG tablet, Take 4 tablets (40 mg total) by mouth daily with breakfast for 4 days, THEN 3 tablets (30 mg total) daily with breakfast for 4 days, THEN 2 tablets (20 mg total) daily with breakfast for 3 days, THEN 1 tablet (10 mg total) daily with breakfast for 3 days., Disp: 37 tablet, Rfl: 0   triamcinolone cream (KENALOG) 0.1 %, Apply 1  Application topically 2 (two) times daily., Disp: 30 g, Rfl: 0   buPROPion (WELLBUTRIN SR) 150 MG 12 hr tablet, Take 1 tablet (150 mg total) by mouth 2 (two) times daily., Disp: 180 tablet, Rfl: 3   ibuprofen (ADVIL) 600 MG tablet, Take 1 tablet (600 mg total) by mouth every 6 (six) hours as needed., Disp: 30 tablet, Rfl: 0  Observations/Objective: Patient is well-developed, well-nourished in no acute distress.  Resting comfortably  at home.  Head  is normocephalic, atraumatic.  No labored breathing. Speech is clear and coherent with logical content.  Patient is alert and oriented at baseline.  Linear distribution of papulovesicular, erythematous rash of lower legs bilaterally, extending up the posterior thigh with L > R. Scattered lesions also noted on anterior thighs.  Assessment and Plan: 1. Allergic contact dermatitis due to plants, except food - triamcinolone cream (KENALOG) 0.1 %; Apply 1 Application topically 2 (two) times daily.  Dispense: 30 g; Refill: 0 - predniSONE (DELTASONE) 10 MG tablet; Take 4 tablets (40 mg total) by mouth daily with breakfast for 4 days, THEN 3 tablets (30 mg total) daily with breakfast for 4 days, THEN 2 tablets (20 mg total) daily with breakfast for 3 days, THEN 1 tablet (10 mg total) daily with breakfast for 3 days.  Dispense: 37 tablet; Refill: 0  Keep skin clean  and dry. Can start topical witch hazel and OTC antihistamine. Rx topical Triamcinolone. Prednisone taper started. Strict follow-up precautions discussed.   Follow Up Instructions: I discussed the assessment and treatment plan with the patient. The patient was provided an opportunity to ask questions and all were answered. The patient agreed with the plan and demonstrated an understanding of the instructions.  A copy of instructions were sent to the patient via MyChart unless otherwise noted below.   The patient was advised to call back or seek an in-person evaluation if the symptoms worsen or if the condition fails to improve as anticipated.  Time:  I spent 10 minutes with the patient via telehealth technology discussing the above problems/concerns.    Leeanne Rio, PA-C

## 2021-11-02 NOTE — Telephone Encounter (Signed)
  Chief Complaint: requesting medication for poison ivy  Symptoms: rash looks like pimples oozing liquid. Itching , started at ankles feet and between toes. Now behind knee and left inner thigh. Frequency: started approx. 5 days ago last Wednesday or Thursday  Pertinent Negatives: Patient denies fever Disposition: '[]'$ ED /'[x]'$ Urgent Care (no appt availability in office) / '[]'$ Appointment(In office/virtual)/ '[]'$  [facility]/ '[]'$ Home Care/ '[]'$ Refused Recommended Disposition /'[]'$ [facility]/ '[]'$  Follow-up with PCP Additional Notes:   No available appt today . Recommended UC or UC VV. Patient would like to hear from PCP before scheduling appt on line. Patient would like a call back if possible . Please advise.   Reason for Disposition  Large blisters or oozing sores  Answer Assessment - Initial Assessment Questions 1. APPEARANCE of RASH: "Describe the rash."      Looks like pimples. Not big area of rash  2. LOCATION: "Where is the rash located?"  (e.g., face, genitals, hands, legs)     Feet , between toes, behind knee, left inner thigh 3. SIZE: "How large is the rash?"      Not large  4. ONSET: "When did the rash begin?"      Approx 5 days ago last Wednesday or Thursday  5. ITCHING: "Does the rash itch?" If Yes, ask: "How bad is it?"   - MILD - doesn't interfere with normal activities   - MODERATE-SEVERE: interferes with work, school, sleep, or other activities      yes 6. EXPOSURE:  "How were you exposed to the plant (poison ivy, poison oak, sumac)"  "When were you exposed?"       Yes last Wednesday /Thursday  7. PAST HISTORY: "Have you had a poison ivy rash before?" If Yes, ask: "How bad was it?"     Yes usually gets prednisone  8. PREGNANCY: "Is there any chance you are pregnant?" "When was your last menstrual period?"     na  Protocols used: [facility] [facility]

## 2021-11-22 ENCOUNTER — Other Ambulatory Visit: Payer: Self-pay | Admitting: Internal Medicine

## 2021-11-22 NOTE — Telephone Encounter (Signed)
Requested Prescriptions  Pending Prescriptions Disp Refills  . buPROPion (WELLBUTRIN SR) 150 MG 12 hr tablet [Pharmacy Med Name: BUPROPION HCL SR TABS '150MG'$ ] 180 tablet 0    Sig: TAKE 1 TABLET TWICE A DAY     Psychiatry: Antidepressants - bupropion Passed - 11/22/2021 12:48 AM      Passed - Cr in normal range and within 360 days    Creat  Date Value Ref Range Status  06/28/2021 0.88 0.50 - 1.05 mg/dL Final         Passed - AST in normal range and within 360 days    AST  Date Value Ref Range Status  06/28/2021 16 10 - 35 U/L Final         Passed - ALT in normal range and within 360 days    ALT  Date Value Ref Range Status  06/28/2021 11 6 - 29 U/L Final         Passed - Completed PHQ-2 or PHQ-9 in the last 360 days      Passed - Last BP in normal range    BP Readings from Last 1 Encounters:  10/04/21 130/83         Passed - Valid encounter within last 6 months    Recent Outpatient Visits          4 months ago Encounter for general adult medical examination with abnormal findings   [facility] [place], Coralie Keens, NP   7 months ago Sebaceous cyst   [facility] [place], Coralie Keens, NP   10 months ago [facility], Coralie Keens, NP   1 year ago [facility] [place], Coralie Keens, NP   1 year ago Encounter for screening mammogram for malignant neoplasm of breast   [facility] [place], Coralie Keens, NP

## 2021-11-23 ENCOUNTER — Ambulatory Visit: Payer: Self-pay

## 2021-11-23 NOTE — Telephone Encounter (Signed)
Summary: poison ivy   Pt had poison ivy and a virtual appt on 8.8.23 and was prescribed prednisone for 14 days and it started to go away but not completely and seem to have come back in the same area/ pt asked if another round can be called in for her before Thursday when she leaves for out of town or does she need to come in for an appt / please advise     Called pt - [id number]

## 2021-11-23 NOTE — Telephone Encounter (Signed)
  Chief Complaint: poison ivy fu Symptoms: poison ivy rash on hand, palm, trunk, breast, ankles Frequency: ongoing for 3 weeks  Pertinent Negatives: NA Disposition: '[]'$ ED /'[]'$ Urgent Care (no appt availability in office) / '[x]'$ Appointment(In office/virtual)/ '[]'$  [facility]/ '[]'$ Home Care/ '[]'$ Refused Recommended Disposition /'[]'$ [place] [facility]/ '[]'$  Follow-up with PCP Additional Notes: scheduled pt for first available appt on 11/25/21 at 0940. Advised pt if sx get worse and needs to be seen sooner can call back and see if any appts sooner or scheduled UC appt. Pt verbalized understanding.  Summary: poison ivy    Pt had poison ivy and a virtual appt on 8.8.23 and was prescribed prednisone for 14 days and it started to go away but not completely and seem to have come back in the same area/ pt asked if another round can be called in for her before Thursday when she leaves for out of town or does she need to come in for an appt / please advise      Reason for Disposition  Rash lasts > 3 weeks  Answer Assessment - Initial Assessment Questions 1. APPEARANCE of RASH: "Describe the rash."      Poison ivy coming back 2. LOCATION: "Where is the rash located?"  (e.g., face, genitals, hands, legs)     Hand, palm, trunk abdomen breast, ankles 4. ONSET: "When did the rash begin?"      11/02/21 5. ITCHING: "Does the rash itch?" If Yes, ask: "How bad is it?"   - MILD - doesn't interfere with normal activities   - MODERATE-SEVERE: interferes with work, school, sleep, or other activities      mild  Protocols used: [place] [facility] [place]

## 2021-11-25 ENCOUNTER — Ambulatory Visit (INDEPENDENT_AMBULATORY_CARE_PROVIDER_SITE_OTHER): Payer: 59 | Admitting: Physician Assistant

## 2021-11-25 VITALS — BP 100/46 | HR 65 | Ht 64.0 in | Wt 115.2 lb

## 2021-11-25 DIAGNOSIS — L255 Unspecified contact dermatitis due to plants, except food: Secondary | ICD-10-CM | POA: Diagnosis not present

## 2021-11-25 MED ORDER — PREDNISONE 20 MG PO TABS: ORAL_TABLET | ORAL | 0 refills | Status: DC

## 2021-11-25 NOTE — Patient Instructions (Signed)
Based on the physical exam today and your answers to my questions I believe you have a condition called allergic contact dermatitis from coming into contact with an irritant (likely poison oak, ivy, sumac, etc)  In order to help you feel better I recommend the following:  Using a topical treatment such as Calamine lotion, Benadryl cream, Cortisone cream as needed Avoiding scratching the rash as this can lead to secondary infection Gently cleanse the skin with warm water and gentle soap- no harsh abrasives or scrubbing is needed  I am sending in a script for a Prednisone taper. This taper is longer than most to help [place] avoid rebound symptoms from discontinuing too quickly. Steroids can cause the following: sleeplessness, increased appetite, elevated blood sugars, and increased irritability. I recommend taking it in the morning to prevent trouble sleeping and try to make changes to your diet to avoid excess sugar if you are diabetic   You can take an over the counter antihistamine such as Allegra, Zyrtec or Claritin during the day to help with the symptoms If you want  you can take a benadryl at night to help with sleep and itching but it can cause drowsiness so be mindful while using  If you may be in the same area that you were exposed to the plant I recommend the following to reduce further irritation Wear long sleeves and pants with closed shoes and cover your face. Use protective eye-wear to prevent eye contact Once finished in the area with the plant, strip your clothing and place it immediately in the wash to be cleaned on the hottest setting with detergent Take a shower and be sure to wash thoroughly to prevent any of the plant oils from lingering on your skin (if you are exposed again without protective clothing- wash your skin immediately with soap and water)  If you think any pets may have been exposed to these plants they will need to be bathed as well. The oils can stay on their hair  for some time and lead to further exposure and rash for you.   Please let us know if you have any further questions or concerns.

## 2021-11-25 NOTE — Progress Notes (Signed)
Acute Office Visit   Patient: Jennifer Garrison   DOB: 03/10/1961   61 y.o. Female  MRN: [id number] Visit Date: 11/25/2021  Today's healthcare provider: Dani Gobble Bleu Moisan, PA-C  Introduced myself to the patient as a Journalist, newspaper and provided education on APPs in clinical practice.    Chief Complaint  Patient presents with   Poison Ivy   Subjective    HPI   Reports rash is improving but still has some lingering lesions along body Reports itching is getting better but still has some recurrent bouts of it  Poison ivy - dx via virtual apt and was provided prednisone taper for 2 weeks which she has completed  She has been using Calamine lotion on the spots    Medications: Outpatient Medications Prior to Visit  Medication Sig   buPROPion (WELLBUTRIN SR) 150 MG 12 hr tablet TAKE 1 TABLET TWICE A DAY   ibuprofen (ADVIL) 600 MG tablet Take 1 tablet (600 mg total) by mouth every 6 (six) hours as needed.   triamcinolone cream (KENALOG) 0.1 % Apply 1 Application topically 2 (two) times daily.   No facility-administered medications prior to visit.    Review of Systems  Constitutional:  Negative for fatigue.  Eyes:  Negative for pain, discharge and itching.  Respiratory:  Negative for chest tightness, shortness of breath and wheezing.   Cardiovascular:  Negative for chest pain and palpitations.  Skin:  Positive for rash.       Objective    BP (!) 100/46   Pulse 65   Ht '5\' 4"'$  (1.626 m)   Wt 115 lb 3.2 oz (52.3 kg)   SpO2 98%   BMI 19.77 kg/m    Physical Exam Vitals reviewed.  Constitutional:      General: She is awake.     Appearance: Normal appearance. She is well-developed, well-groomed and normal weight.  Eyes:     General: Lids are normal. Gaze aligned appropriately.     Extraocular Movements: Extraocular movements intact.     Conjunctiva/sclera: Conjunctivae normal.     Pupils: Pupils are equal, round, and reactive to light.  Pulmonary:     Effort: Pulmonary effort is  normal.  Skin:    General: Skin is warm.     Findings: Lesion and rash present. Rash is crusting and papular.     Comments: Generalized isolated lesions along ankles, hands, legs and trunk No erythematous base. All discreet lesions with slight scabbing. No evidence of purulence, drainage, swelling or streaking from lesions   Neurological:     General: No focal deficit present.     Mental Status: She is alert and oriented to person, place, and time.     GCS: GCS eye subscore is 4. GCS verbal subscore is 5. GCS motor subscore is 6.     Cranial Nerves: Cranial nerves 2-12 are intact. No dysarthria or facial asymmetry.  Psychiatric:        Attention and Perception: Attention and perception normal.        Mood and Affect: Mood and affect normal.        Speech: Speech normal.        Behavior: Behavior normal. Behavior is cooperative.       No results found for any visits on 11/25/21.  Assessment & Plan      No follow-ups on file.       Problem List Items Addressed This Visit   None Visit Diagnoses  Dermatitis due to plants, including poison ivy, sumac, and oak    -  Primary Acute, ongoing concern Reports exposure to poison ivy earlier in the month which was treated with Prednisone taper for 14 days States she is still having itching and lingering rash Offered IM steroid or one week Prednisone taper to further assist with resolution- pt would prefer oral taper at this time. Recommend she continue with topical Calamine and triamcinalone cream for further relief She can use antihistamines as well for further relief Folow up as needed for persistent or progressing symptoms    Relevant Medications   predniSONE (DELTASONE) 20 MG tablet        No follow-ups on file.   I, Iseah Plouff E Kailey Esquilin, PA-C, have reviewed all documentation for this visit. The documentation on 11/25/21 for the exam, diagnosis, procedures, and orders are all accurate and complete.   Talitha Givens, MHS,  PA-C [facility]

## 2021-12-16 ENCOUNTER — Ambulatory Visit: Payer: Self-pay | Admitting: *Deleted

## 2021-12-16 NOTE — Telephone Encounter (Signed)
Message from [name] sent at 12/16/2021 10:36 AM EDT  Summary: Possible rosacea   Pt has a rash on her face that she believes that she has rosacea on her face, no swelling no itching no burning. Just a cluster of redness and small pimples.  Best contact: [phone]   No appt soon enough           Call History   Type Contact Phone/Fax User  12/16/2021 10:34 AM EDT Phone (Incoming) Phagan, Jennifer "Deb" (Self) [phone] [name]) [name]   Reason for Disposition  [1] Pimples (localized) AND [place].Ok ] no improvement after using Care Advice  Answer Assessment - Initial Assessment Questions 1. APPEARANCE of RASH: "Describe the rash."      I returned her call.    I had poison Ivy.   I did a virtual visit and prescribed prednisone and the rash came back.   Went to dr and was prescribed prednisone again.  The poison Karlene Einstein went away.   But these bumps on my face are on left side below nose above the lip area towards the cheek. I'm not sure if it's spreading or not.   It does not seem to be spreading.    2. LOCATION: "Where is the rash located?"      Now I have a rash on my face.   It does not itch or burn.   It's raised bumps.   I wonder if I have Rosacea. I'm using the Kenalog cream on it.    3. NUMBER: "How many spots are there?"      See above 4. SIZE: "How big are the spots?" (Inches, centimeters or compare to size of a coin)      Raised bumps that do not itch.    5. ONSET: "When did the rash start?"      Around the later part of getting over the poison Ivy.   3-4 weeks now. 6. ITCHING: "Does the rash itch?" If Yes, ask: "How bad is the itch?"  (Scale 0-10; or none, mild, moderate, severe)     No 7. PAIN: "Does the rash hurt?" If Yes, ask: "How bad is the pain?"  (Scale 0-10; or none, mild, moderate, severe)    - NONE (0): no pain    - MILD (1-3): doesn't interfere with normal activities     - MODERATE (4-7): interferes with normal activities or awakens from sleep     -  SEVERE (8-10): excruciating pain, unable to do any normal activities     Does not burn or itch. 8. OTHER SYMPTOMS: "Do you have any other symptoms?" (e.g., fever)     No 9. PREGNANCY: "Is there any chance you are pregnant?" "When was your last menstrual period?"     Not asked  Protocols used: Rash or Redness - Localized-A-AH

## 2021-12-16 NOTE — Telephone Encounter (Signed)
  Chief Complaint: Rash on face Symptoms: small raised pimples that are red  Frequency: Started 3-4 weeks ago    (Been treated for poison Karlene Einstein recently with prednisone but that has cleared up) Pertinent Negatives: Patient denies itching, burning or swelling of rash.    Disposition: '[]'$ ED /'[]'$ Urgent Care (no appt availability in office) / '[x]'$ Appointment(In office/virtual)/ '[]'$  [facility]/ '[]'$ Home Care/ '[]'$ Refused Recommended Disposition /'[]'$ [facility]/ '[]'$  Follow-up with PCP Additional Notes: Appt made with Webb Silversmith, NP for 12/22/2021 for 9:40 with instruction to call us if the rash starts spreading near her eyes.   She verbalized understanding.

## 2021-12-22 ENCOUNTER — Ambulatory Visit (INDEPENDENT_AMBULATORY_CARE_PROVIDER_SITE_OTHER): Payer: 59 | Admitting: Internal Medicine

## 2021-12-22 ENCOUNTER — Encounter: Payer: Self-pay | Admitting: Internal Medicine

## 2021-12-22 VITALS — BP 116/72 | HR 60 | Temp 96.5°F | Wt 117.0 lb

## 2021-12-22 DIAGNOSIS — M810 Age-related osteoporosis without current pathological fracture: Secondary | ICD-10-CM | POA: Insufficient documentation

## 2021-12-22 DIAGNOSIS — Z840 Family history of diseases of the skin and subcutaneous tissue: Secondary | ICD-10-CM | POA: Diagnosis not present

## 2021-12-22 DIAGNOSIS — F419 Anxiety disorder, unspecified: Secondary | ICD-10-CM

## 2021-12-22 DIAGNOSIS — K582 Mixed irritable bowel syndrome: Secondary | ICD-10-CM

## 2021-12-22 DIAGNOSIS — R21 Rash and other nonspecific skin eruption: Secondary | ICD-10-CM

## 2021-12-22 DIAGNOSIS — F32A Anxiety disorder, unspecified: Secondary | ICD-10-CM

## 2021-12-22 MED ORDER — BUSPIRONE HCL 5 MG PO TABS: 5.0000 mg | ORAL_TABLET | Freq: Two times a day (BID) | ORAL | 2 refills | Status: DC | PRN

## 2021-12-22 MED ORDER — METRONIDAZOLE 0.75 % EX CREA: TOPICAL_CREAM | Freq: Two times a day (BID) | CUTANEOUS | 0 refills | Status: DC

## 2021-12-22 NOTE — Assessment & Plan Note (Signed)
Continue high-fiber diet

## 2021-12-22 NOTE — Assessment & Plan Note (Signed)
Deteriorated Continue bupropion We will add buspirone 5 mg twice daily as needed Support offered

## 2021-12-22 NOTE — Patient Instructions (Signed)
Lupus Anticoagulant Panel Test Why am I having this test? The lupus anticoagulant panel test can be used to help find the cause of abnormal blood clotting. Your health care provider may recommend this test if: You are a woman and have had repeated miscarriages, preterm labor, or high blood pressure in pregnancy (preeclampsia). You have had previous blood tests showing a longer-than-normal blood clotting time or a lower-than-normal number of platelets (thrombocytopenia). Your health care provider suspects that you have a condition called antiphospholipid syndrome. You have systemic lupus erythematosus (SLE, or lupus) or your health care provider suspects that you have this condition. Your health care provider suspects that you have some type of rheumatic disease. What is being tested? This test checks your blood for certain autoantibodies (lupus anticoagulant autoantibodies) that can interfere with the normal blood clotting process. Autoantibodies are proteins that your body's defense system (immune system) makes to help fight invading germs, such as bacteria and viruses. Sometimes, the body mistakes normal tissues for abnormal ones, and it attacks those tissues as if they were invading germs. This is called an autoimmune response. The autoantibodies checked for in this test can mistakenly attack phospholipids, which are a normal part of many types of cells in the body. When blood clotting cells (platelets) are attacked by antiphospholipid antibodies, abnormal blood clotting can occur. When this happens, you are at an increased risk of developing repeated blood clots in your arteries and veins. You are also at an increased risk for heart attack or stroke. What kind of sample is taken?  A blood sample is required for this test. It is usually collected by inserting a needle into a blood vessel or by sticking a finger with a small needle. Tell a health care provider about: All medicines you are taking,  including vitamins, herbs, eye drops, creams, and over-the-counter medicines. Any medical conditions you have. How are the results reported? Your test results will be reported as values that indicate whether lupus anticoagulant autoantibodies were found in your blood. Your health care provider will compare your results to normal values that were established after testing a large group of people (reference values). Reference values may vary among labs and hospitals. For this test, common normal reference values are: Less than 11 MPL (IgM phospholipid units). Less than 23 GPL (IgG phospholipid units). What do the results mean? Results that are higher than the reference values may indicate the following health conditions: Lupus. Antiphospholipid syndrome. Talk with your health care provider about what your results mean. Questions to ask your health care provider Ask your health care provider, or the department that is doing the test: When will my results be ready? How will I get my results? What are my treatment options? What other tests do I need? What are my next steps? Summary The lupus anticoagulant panel test can be used to help find the cause of abnormal blood clotting. This test checks your blood for certain proteins (autoantibodies) that can interfere with the normal blood clotting process. The autoantibodies checked for in this test can mistakenly attack phospholipids, which are a normal part of many types of cells in the body. If the autoantibodies attack blood clotting cells (platelets), you may develop frequent blood clots. Talk with your health care provider about what your test results mean. This information is not intended to replace advice given to you by your health care provider. Make sure you discuss any questions you have with your health care provider. Document Revised: 06/01/2021 Document Reviewed: 08/19/2019  Elsevier Patient Education  2023 Elsevier Inc.  

## 2021-12-22 NOTE — Assessment & Plan Note (Signed)
Encouraged to start Fosamax Continue vitamin D and calcium OTC Encourage daily weightbearing exercise

## 2021-12-22 NOTE — Progress Notes (Signed)
Subjective:    Patient ID: Jennifer Garrison, female    DOB: [birth year], 61 y.o.   MRN: [id number]  HPI  Patient presents to clinic today for follow-up of chronic conditions.  Anxiety and Depression: Chronic, managed on Bupropion.  She does feel like she has been having more anxiety lately.  She is not currently seeing a therapist.  She denies SI/HI.  IBS: She reports alternating constipation and diarrhea.  She tries to manage this with diet.  Colonoscopy from 09/2019 reviewed.  Osteoporosis: She is not taking any Calcium and Vitamin D OTC. She has been prescribed Fosamax but has not started this due to concern of having a recent root canal. She tries to get weightbearing exercise and daily.  Bone density from 10/2021 reviewed.  She also reports a rash of her face.  She noticed this 2 to 3 weeks ago.  It has not spread.  It does seem to itch.  She thought it was poison ivy, was prescribed Prednisone but it did not improve this rash. She also tried Triamcinolone cream with minimal improvement in symptoms.  She does have a family history of lupus.  Review of Systems     Past Medical History:  Diagnosis Date   Allergy    Anxiety    IBS (irritable bowel syndrome)    Trigger finger    Left Middle Finger 11/2016    Current Outpatient Medications  Medication Sig Dispense Refill   buPROPion (WELLBUTRIN SR) 150 MG 12 hr tablet TAKE 1 TABLET TWICE A DAY 180 tablet 1   ibuprofen (ADVIL) 600 MG tablet Take 1 tablet (600 mg total) by mouth every 6 (six) hours as needed. 30 tablet 0   triamcinolone cream (KENALOG) 0.1 % Apply 1 Application topically 2 (two) times daily. 30 g 0   No current facility-administered medications for this visit.    Allergies  Allergen Reactions   Poison Ivy Extract Rash    Family History  Problem Relation Age of Onset   Lung cancer Mother    Hypertension Mother    Lupus Father    Breast cancer Neg Hx     Social History   Socioeconomic History   Marital  status: Married    Spouse name: Not on file   Number of children: 1   Years of education: Not on file   Highest education level: Not on file  Occupational History   Not on file  Tobacco Use   Smoking status: Former    Types: Cigarettes    Quit date: 09/09/1992    Years since quitting: 29.3   Smokeless tobacco: Current    Types: Chew   Tobacco comments:    Chewing tobacco   Vaping Use   Vaping Use: Former  Substance and Sexual Activity   Alcohol use: Yes    Comment: socially    Drug use: No   Sexual activity: Never  Other Topics Concern   Not on file  Social History Narrative   Not on file   Social Determinants of Health   Financial Resource Strain: Not on file  Food Insecurity: Not on file  Transportation Needs: Not on file  Physical Activity: Not on file  Stress: Not on file  Social Connections: Not on file  Intimate Partner Violence: Not on file     Constitutional: Denies fever, malaise, fatigue, headache or abrupt weight changes.  Respiratory: Denies difficulty breathing, shortness of breath, cough or sputum production.   Cardiovascular: Denies chest pain, chest tightness,  palpitations or swelling in the hands or feet.  Gastrointestinal: Patient reports alternating constipation and diarrhea.  Denies abdominal pain, bloating, or blood in the stool.  GU: Denies urgency, frequency, pain with urination, burning sensation, blood in urine, odor or discharge. Musculoskeletal: Denies decrease in range of motion, difficulty with gait, muscle pain or joint pain and swelling.  Skin: Patient reports rash of face.  Denies redness, lesions or ulcercations.  Neurological: Denies dizziness, difficulty with memory, difficulty with speech or problems with balance and coordination.  Psych: Patient has a history of anxiety and depression.  Denies SI/HI.  No other specific complaints in a complete review of systems (except as listed in HPI above).  Objective:   Physical Exam  BP  116/72 (BP Location: Right Arm, Patient Position: Sitting, Cuff Size: Normal)   Pulse 60   Temp (!) 96.5 F (35.8 C) (Temporal)   Wt 117 lb (53.1 kg)   SpO2 99%   BMI 20.08 kg/m   Wt Readings from Last 3 Encounters:  11/25/21 115 lb 3.2 oz (52.3 kg)  06/28/21 118 lb (53.5 kg)  05/03/21 118 lb (53.5 kg)    General: Appears her stated age, well developed, well nourished in NAD. Skin: Warm, dry and intact.  Grouped vesicular rash on erythematous base noted to the left cheek. HEENT: Head: normal shape and size; Eyes: sclera white, no icterus, conjunctiva pink, PERRLA and EOMs intact;  Cardiovascular: Normal rate and rhythm. S1,S2 noted.  No murmur, rubs or gallops noted. Pulmonary/Chest: Normal effort and positive vesicular breath sounds. No respiratory distress. No wheezes, rales or ronchi noted.  Abdomen: Normal bowel sounds. Musculoskeletal: . No difficulty with gait.  Neurological: Alert and oriented.  Psychiatric: Mood and affect normal.  Mildly anxious appearing. Judgment and thought content normal.    BMET    Component Value Date/Time   NA 142 06/28/2021 0918   NA 138 09/17/2019 1136   K 4.6 06/28/2021 0918   CL 106 06/28/2021 0918   CO2 27 06/28/2021 0918   GLUCOSE 87 06/28/2021 0918   BUN 16 06/28/2021 0918   BUN 16 09/17/2019 1136   CREATININE 0.88 06/28/2021 0918   CALCIUM 9.5 06/28/2021 0918   GFRNONAA 71 09/17/2019 1136   GFRAA 81 09/17/2019 1136    Lipid Panel     Component Value Date/Time   CHOL 162 06/28/2021 0918   CHOL 165 09/17/2019 1136   TRIG 60 06/28/2021 0918   HDL 59 06/28/2021 0918   HDL 51 09/17/2019 1136   CHOLHDL 2.7 06/28/2021 0918   LDLCALC 88 06/28/2021 0918    CBC    Component Value Date/Time   WBC 6.6 06/28/2021 0918   RBC 4.85 06/28/2021 0918   HGB 14.0 06/28/2021 0918   HGB 14.9 09/17/2019 1136   HCT 43.1 06/28/2021 0918   HCT 43.8 09/17/2019 1136   PLT 231 06/28/2021 0918   PLT 250 09/17/2019 1136   MCV 88.9 06/28/2021  0918   MCV 87 09/17/2019 1136   MCH 28.9 06/28/2021 0918   MCHC 32.5 06/28/2021 0918   RDW 12.4 06/28/2021 0918   RDW 11.9 09/17/2019 1136   LYMPHSABS 2.1 09/17/2019 1136   EOSABS 0.2 09/17/2019 1136   BASOSABS 0.1 09/17/2019 1136    Hgb A1C No results found for: "HGBA1C"         Assessment & Plan:   Rash of Face, Family History of Lupus:  Rx for Metronidazole cream-use as directed We will check ANA, CRP, ESR, C3 and  C4, anti-double-stranded DNA, antiphospholipid antibodies Referral to dermatology just in case cream is not effective and lupus work-up is negative   RTC in 8 months for your annual exam Webb Silversmith, NP

## 2021-12-26 LAB — C3 AND C4
C3 Complement: 98 mg/dL (ref 83–193)
C4 Complement: 26 mg/dL (ref 15–57)

## 2021-12-26 LAB — ANTI-DNA ANTIBODY, DOUBLE-STRANDED: ds DNA Ab: <1 IU/mL

## 2021-12-26 LAB — ANTIPHOSPHOLIPID SYNDROME DIAGNOSTIC PANEL
Anticardiolipin IgA: <2 APL-U/mL (ref <20.0)
Anticardiolipin IgG: <2 GPL-U/mL (ref <20.0)
Anticardiolipin IgM: 31.3 MPL-U/mL — ABNORMAL HIGH (ref <20.0)
Beta-2 Glyco 1 IgA: <2 U/mL (ref <20.0)
Beta-2 Glyco 1 IgM: 28.7 U/mL — ABNORMAL HIGH (ref <20.0)
Beta-2 Glyco I IgG: <2 U/mL (ref <20.0)
PTT-LA Screen: 32 s (ref <=40)
dRVVT: 35 s (ref <=45)

## 2021-12-26 LAB — ANA: Anti Nuclear Antibody (ANA): NEGATIVE

## 2021-12-26 LAB — SEDIMENTATION RATE: Sed Rate: 11 mm/h (ref 0–30)

## 2021-12-26 LAB — C-REACTIVE PROTEIN: CRP: 2.7 mg/L (ref <8.0)

## 2021-12-27 ENCOUNTER — Encounter: Payer: Self-pay | Admitting: Internal Medicine

## 2021-12-29 IMAGING — MG DIGITAL SCREENING BREAST BILAT IMPLANT W/ TOMO W/ CAD
8 of 17 series · 8 of 40 positions shown · non-contrast
Comparison: Previous exam(s).

CLINICAL DATA: Screening.

EXAM:
DIGITAL SCREENING BILATERAL MAMMOGRAM WITH IMPLANTS, CAD AND
TOMOSYNTHESIS
TECHNIQUE: Bilateral screening digital craniocaudal and mediolateral oblique
mammograms were obtained. Bilateral screening digital breast
tomosynthesis was performed. The images were evaluated with
computer-aided detection. Standard and/or implant displaced views
were performed.

[L MLO]
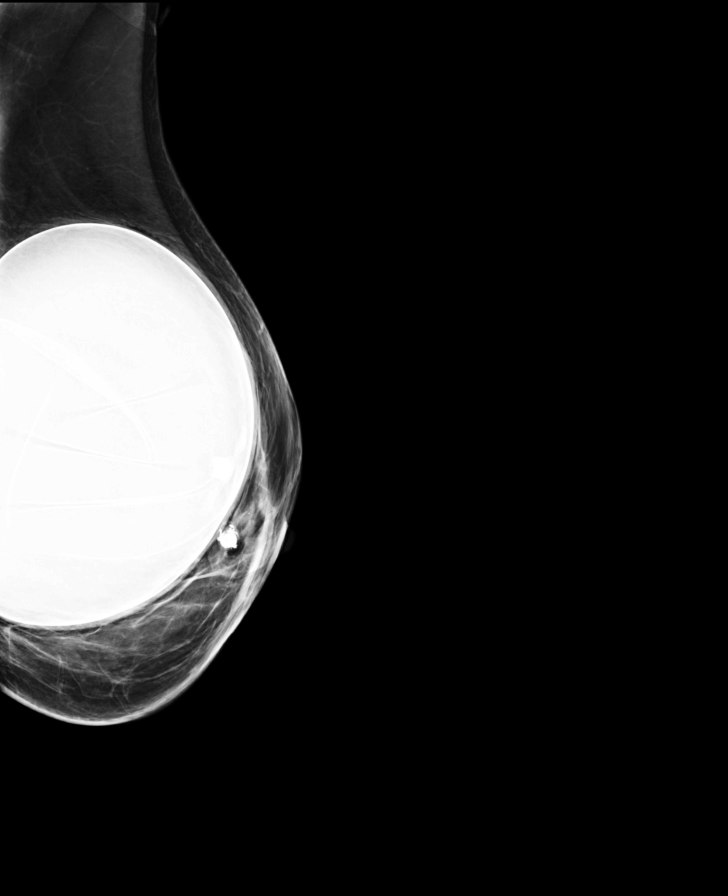

[R CC]
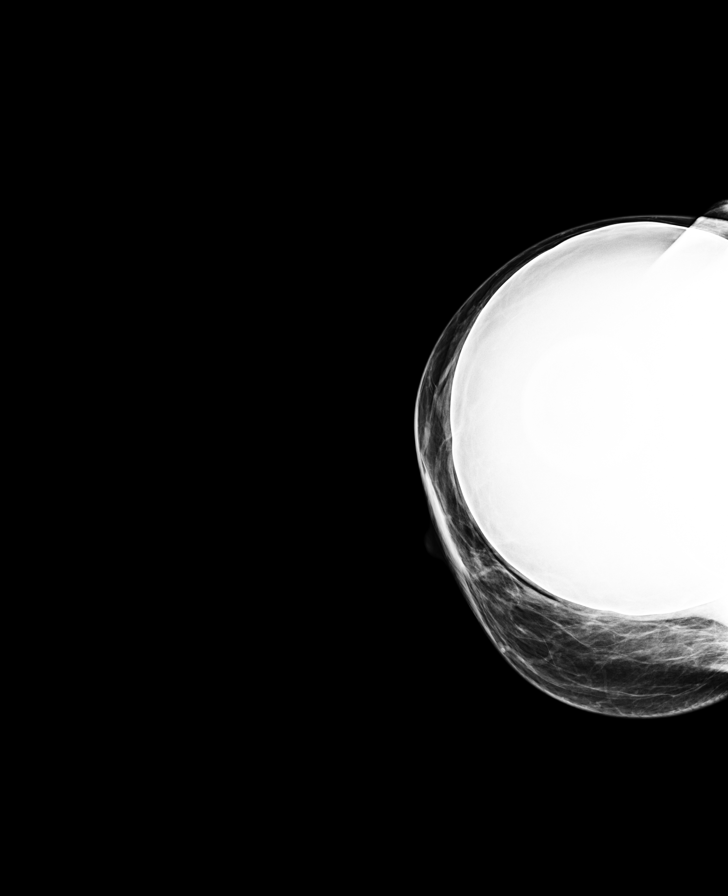

[R MLO]
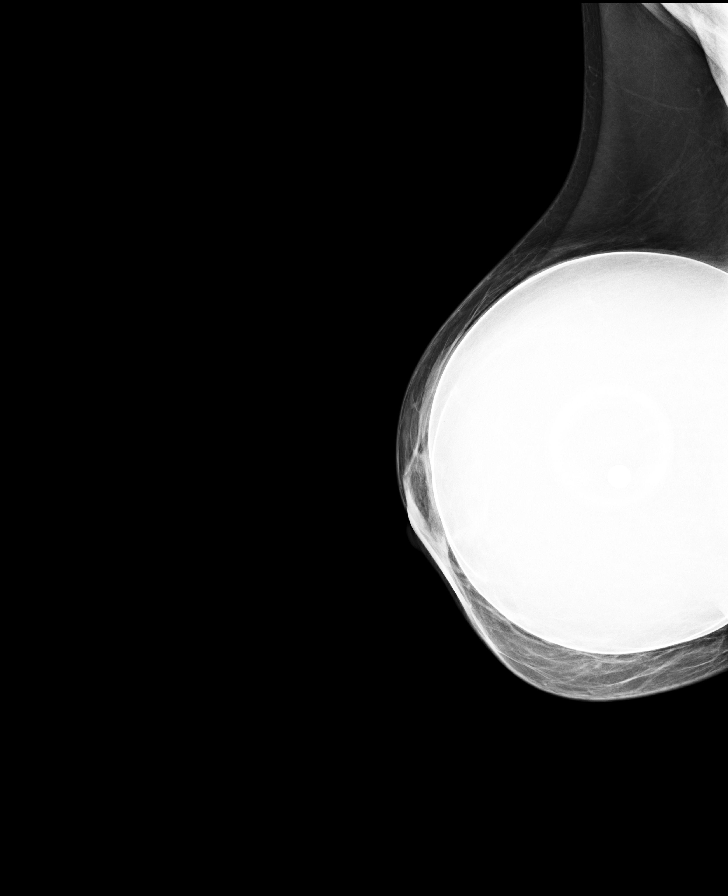

[L CC]
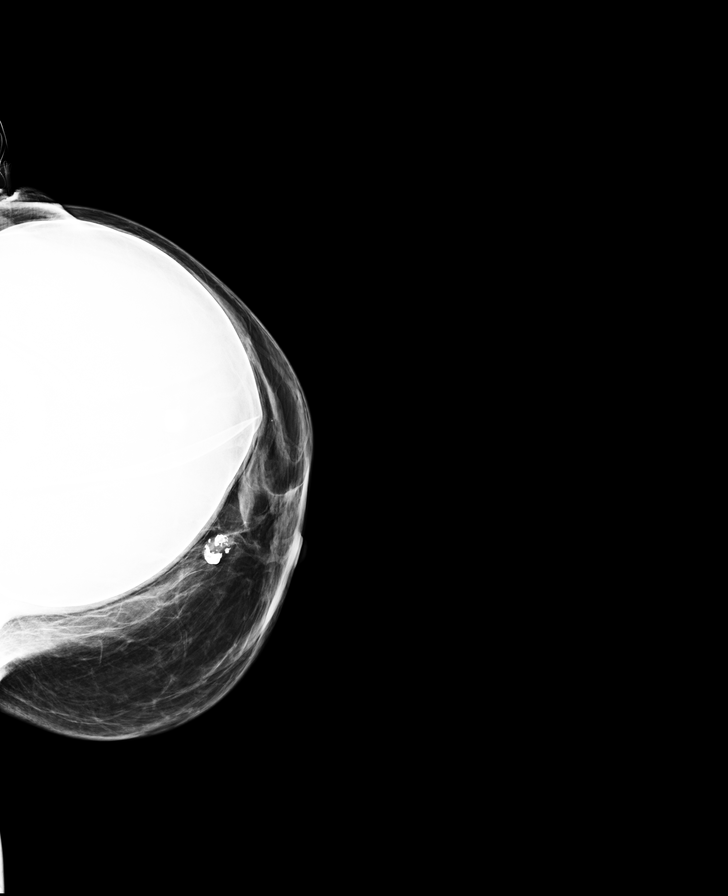

[R MLO synth-2D]
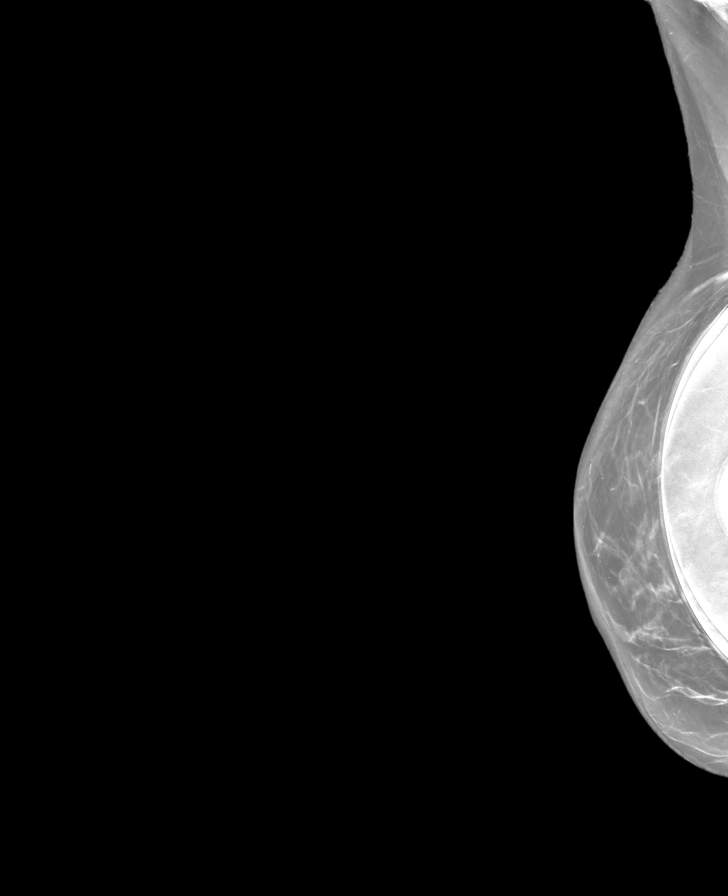

[R CC synth-2D]
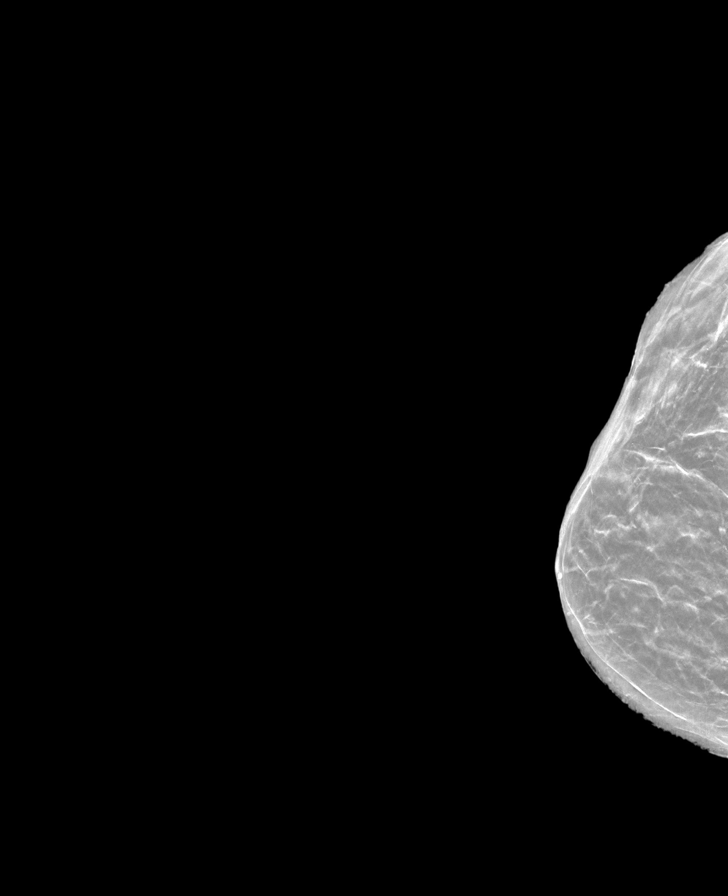

[L MLO synth-2D (1 of 2)]
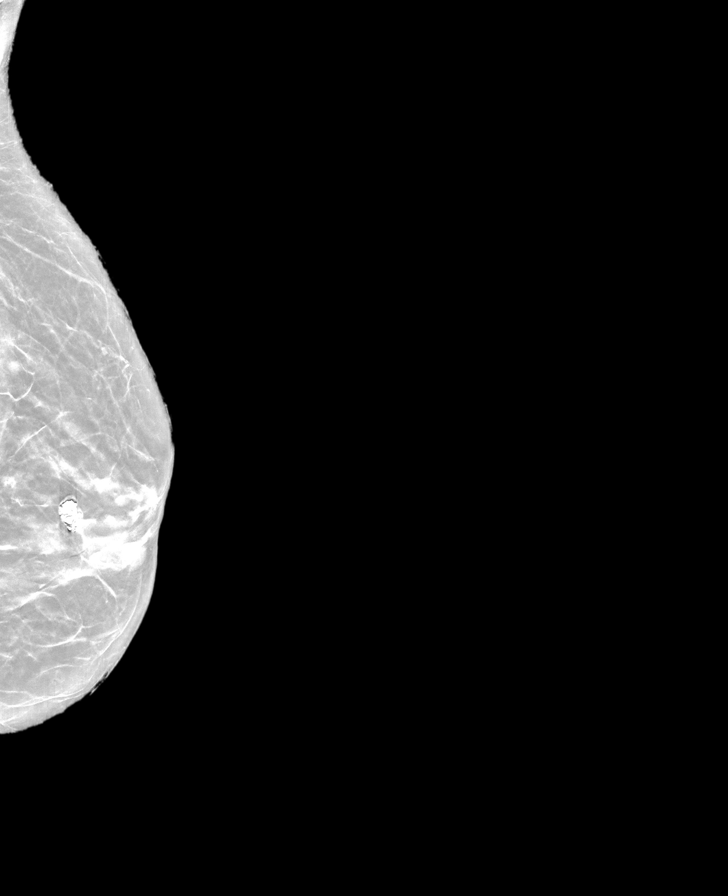

[L MLO synth-2D (2 of 2)]
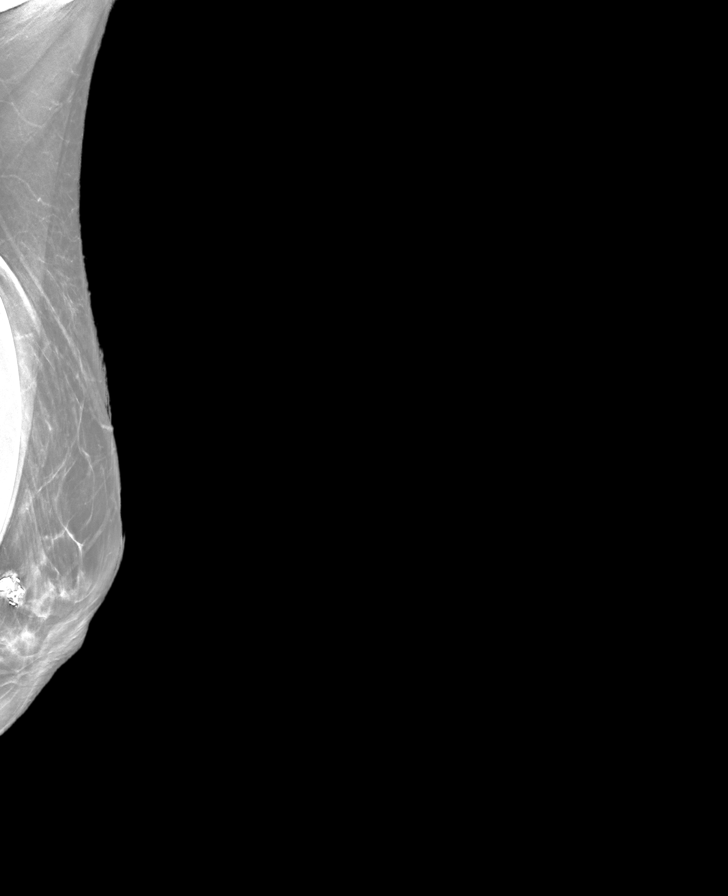

[8 of 40 positions shown; findings below may reference images not displayed]

ACR Breast Density Category c: The breast tissue is heterogeneously
dense, which may obscure small masses.
FINDINGS: The patient has retropectoral saline implants. There are no findings
suspicious for malignancy.
IMPRESSION: No mammographic evidence of malignancy. A result letter of this
screening mammogram will be mailed directly to the patient.

RECOMMENDATION:
Screening mammogram in one year. (Code:FQ-5-EOL)

BI-RADS CATEGORY  1:  Negative.

## 2022-01-21 ENCOUNTER — Other Ambulatory Visit: Payer: Self-pay | Admitting: Internal Medicine

## 2022-01-21 ENCOUNTER — Telehealth: Payer: Self-pay | Admitting: Internal Medicine

## 2022-01-21 DIAGNOSIS — R21 Rash and other nonspecific skin eruption: Secondary | ICD-10-CM

## 2022-01-21 MED ORDER — METRONIDAZOLE 0.75 % EX CREA: TOPICAL_CREAM | Freq: Two times a day (BID) | CUTANEOUS | 0 refills | Status: DC

## 2022-01-21 NOTE — Telephone Encounter (Signed)
Referral Request - Has patient seen PCP for this complaint? yes *If NO, is insurance requiring patient see PCP for this issue before PCP can refer them? Referral for which specialty: dermotologist Preferred provider/office: ? Reason for referral: rash keeps coming back even when using metroNIDAZOLE, helps but doenst completely go away.

## 2022-01-21 NOTE — Addendum Note (Signed)
Addended by: Jearld Fenton on: 01/21/2022 02:46 PM   Modules accepted: Orders

## 2022-01-21 NOTE — Telephone Encounter (Signed)
Referral placed.

## 2022-03-30 ENCOUNTER — Other Ambulatory Visit: Payer: Self-pay | Admitting: Internal Medicine

## 2022-03-30 NOTE — Telephone Encounter (Signed)
Medication Refill - Medication:  metroNIDAZOLE (METROCREAM) 0.75 % cream [[id number]]  Pt left hers out of town and needs refill / please advise    Has the patient contacted their pharmacy? No. (Agent: If no, request that the patient contact the pharmacy for the refill. If patient does not wish to contact the pharmacy document the reason why and proceed with request.) (Agent: If yes, when and what did the pharmacy advise?)  Preferred Pharmacy (with phone number or street name):  [place] [facility] (N), [place] [facility] [place]     Has the patient been seen for an appointment in the last year OR does the patient have an upcoming appointment? Yes.    Agent: Please be advised that RX refills may take up to 3 business days. We ask that you follow-up with your pharmacy.

## 2022-03-31 MED ORDER — METRONIDAZOLE 0.75 % EX CREA: TOPICAL_CREAM | Freq: Two times a day (BID) | CUTANEOUS | 0 refills | Status: DC

## 2022-03-31 NOTE — Telephone Encounter (Signed)
Requested medication (s) are due for refill today - provider review   Requested medication (s) are on the active medication list -yes  Future visit scheduled -no  Last refill: 01/21/22 45g  Notes to clinic: medication not assigned protocol- provider review    Requested Prescriptions  Pending Prescriptions Disp Refills   metroNIDAZOLE (METROCREAM) 0.75 % cream 45 g 0    Sig: Apply topically 2 (two) times daily.     Off-Protocol Failed - 03/30/2022  2:46 PM      Failed - Medication not assigned to a protocol, review manually.      Passed - Valid encounter within last 12 months    Recent Outpatient Visits           3 months ago Age-related osteoporosis without current pathological fracture   [facility] [place], Coralie Keens, NP   4 months ago Dermatitis due to plants, including poison ivy, sumac, and oak   [place], PA-C   9 months ago Encounter for general adult medical examination with abnormal findings   [facility] [place], Coralie Keens, NP   12 months ago Sebaceous cyst   [facility] [place], Coralie Keens, NP   1 year ago [facility] [place], Coralie Keens, NP                 Requested Prescriptions  Pending Prescriptions Disp Refills   metroNIDAZOLE (METROCREAM) 0.75 % cream 45 g 0    Sig: Apply topically 2 (two) times daily.     Off-Protocol Failed - 03/30/2022  2:46 PM      Failed - Medication not assigned to a protocol, review manually.      Passed - Valid encounter within last 12 months    Recent Outpatient Visits           3 months ago Age-related osteoporosis without current pathological fracture   [place], NP   4 months ago Dermatitis due to plants, including poison ivy, sumac, and oak   [place], PA-C   9 months ago Encounter for general adult medical examination with abnormal findings   [facility] [place], Coralie Keens, NP   12 months ago Sebaceous cyst   [facility] [place], Coralie Keens, NP   1 year ago [facility] [place], Coralie Keens, [place]

## 2022-05-10 ENCOUNTER — Ambulatory Visit (INDEPENDENT_AMBULATORY_CARE_PROVIDER_SITE_OTHER): Payer: 59 | Admitting: Internal Medicine

## 2022-05-10 ENCOUNTER — Encounter: Payer: Self-pay | Admitting: Internal Medicine

## 2022-05-10 VITALS — BP 114/76 | HR 64 | Temp 96.8°F | Wt 114.0 lb

## 2022-05-10 DIAGNOSIS — N6322 Unspecified lump in the left breast, upper inner quadrant: Secondary | ICD-10-CM

## 2022-05-10 NOTE — Patient Instructions (Signed)
Breast Tenderness Breast tenderness is a common problem for women of all ages, but may also occur in men. Breast tenderness has many possible causes, including hormone changes, infections, taking certain medicines, and caffeine intake. In women, the pain usually comes and goes with the menstrual cycle, but it can also be constant. Breast tenderness may range from mild discomfort to severe pain. You may have tests, such as a mammogram or an ultrasound, to check for any unusual findings. Having breast tenderness usually does not mean that you have breast cancer. Follow these instructions at home: Managing pain and discomfort  If directed, put ice on the painful area. To do this: Put ice in a plastic bag. Place a towel between your skin and the bag. Leave the ice on for 20 minutes, 2-3 times a day. If your skin turns bright red, remove the ice right away to prevent skin damage. The risk of skin damage is higher if you cannot feel pain, heat, or cold. Wear a supportive bra or chest support: During exercise. While sleeping, if your breasts are very tender. Medicines Take over-the-counter and prescription medicines only as told by your health care provider. If the cause of your pain is an infection, you may be prescribed an antibiotic medicine. If you were prescribed antibiotics, take them as told by your health care provider. Do not stop using the antibiotic even if you start to feel better. Eating and drinking Decrease the amount of caffeine in your diet. Instead, drink more water and choose caffeine-free drinks. Your health care provider may recommend that you lessen the amount of fat in your diet. You can do this by: Limiting fried foods. Cooking foods using methods such as baking, boiling, grilling, and broiling. General instructions  Keep a log of the days and times when your breasts are most tender. Ask your health care provider how to do breast exams at home. This will help you notice if  you have an unusual growth or lump. Keep all follow-up visits. Contact a health care provider if: Any part of your breast is hard, red, and hot to the touch. This may be a sign of infection. You are a woman and have a new or painful lump in your breast that remains after your menstrual period ends. You are not breastfeeding and you have fluid, especially blood or pus, coming out of your nipples. You have a fever. Your pain does not improve or it gets worse. Your pain is interfering with your daily activities. Summary Breast tenderness may range from mild discomfort to severe pain. Breast tenderness has many possible causes, including hormone changes, infections, taking certain medicines, and caffeine intake. It can be treated with ice, wearing a supportive bra or chest support, and medicines. Make changes to your diet as told by your health care provider. This information is not intended to replace advice given to you by your health care provider. Make sure you discuss any questions you have with your health care provider. Document Revised: 05/26/2021 Document Reviewed: 05/26/2021 Elsevier Patient Education  2023 Elsevier Inc.  

## 2022-05-10 NOTE — Progress Notes (Signed)
Subjective:    Patient ID: Jennifer Garrison, female    DOB: January 12, 1961, 62 y.o.   MRN: [id number]  HPI  Patient presents to clinic today with complaint of lump in her left breast.  She noticed this 3 days ago.  She reports the mass was initially tender to touch.  She is unsure if it has grown in size.  Her last mammogram was 10/2021.  She has no family history of breast cancer that she is aware of.  Review of Systems     Past Medical History:  Diagnosis Date   Allergy    Anxiety    IBS (irritable bowel syndrome)    Trigger finger    Left Middle Finger 11/2016    Current Outpatient Medications  Medication Sig Dispense Refill   alendronate (FOSAMAX) 70 MG tablet Take 70 mg by mouth once a week.     buPROPion (WELLBUTRIN SR) 150 MG 12 hr tablet TAKE 1 TABLET TWICE A DAY 180 tablet 1   busPIRone (BUSPAR) 5 MG tablet Take 1 tablet (5 mg total) by mouth 2 (two) times daily as needed. 60 tablet 2   ibuprofen (ADVIL) 600 MG tablet Take 1 tablet (600 mg total) by mouth every 6 (six) hours as needed. 30 tablet 0   metroNIDAZOLE (METROCREAM) 0.75 % cream Apply topically 2 (two) times daily. 45 g 0   triamcinolone cream (KENALOG) 0.1 % Apply 1 Application topically 2 (two) times daily. 30 g 0   No current facility-administered medications for this visit.    Allergies  Allergen Reactions   Poison Ivy Extract Rash    Family History  Problem Relation Age of Onset   Lung cancer Mother    Hypertension Mother    Lupus Father    Breast cancer Neg Hx     Social History   Socioeconomic History   Marital status: Married    Spouse name: Not on file   Number of children: 1   Years of education: Not on file   Highest education level: Not on file  Occupational History   Not on file  Tobacco Use   Smoking status: Former    Types: Cigarettes    Quit date: 09/09/1992    Years since quitting: 29.6   Smokeless tobacco: Current    Types: Chew   Tobacco comments:    Chewing tobacco   Vaping  Use   Vaping Use: Former  Substance and Sexual Activity   Alcohol use: Yes    Comment: socially    Drug use: No   Sexual activity: Never  Other Topics Concern   Not on file  Social History Narrative   Not on file   Social Determinants of Health   Financial Resource Strain: Not on file  Food Insecurity: Not on file  Transportation Needs: Not on file  Physical Activity: Not on file  Stress: Not on file  Social Connections: Not on file  Intimate Partner Violence: Not on file     Constitutional: Denies fever, malaise, fatigue, headache or abrupt weight changes.  Respiratory: Denies difficulty breathing, shortness of breath, cough or sputum production.   Cardiovascular: Denies chest pain, chest tightness, palpitations or swelling in the hands or feet.  Skin: Patient reports lump in left breast.  Denies redness, rashes, lesions or ulcercations.    No other specific complaints in a complete review of systems (except as listed in HPI above).  Objective:   Physical Exam   BP 114/76 (BP Location: Left Arm,  Patient Position: Sitting, Cuff Size: Normal)   Pulse 64   Temp (!) 96.8 F (36 C) (Temporal)   Wt 114 lb (51.7 kg)   SpO2 98%   BMI 19.57 kg/m   Wt Readings from Last 3 Encounters:  12/22/21 117 lb (53.1 kg)  11/25/21 115 lb 3.2 oz (52.3 kg)  06/28/21 118 lb (53.5 kg)    General: Appears her stated age, well developed, well nourished in NAD. Breast: Symmetrical.  1 cm round mass noted at 11:00 in the left breast.  Implants noted bilaterally.  No axillary lymphadenopathy noted.  No nipple discharge present. Cardiovascular: Normal rate and rhythm.  Pulmonary/Chest: Normal effort and positive vesicular breath sounds. No respiratory distress. No wheezes, rales or ronchi noted.  Musculoskeletal:  No difficulty with gait.  Neurological: Alert and oriented.     BMET    Component Value Date/Time   NA 142 06/28/2021 0918   NA 138 09/17/2019 1136   K 4.6 06/28/2021  0918   CL 106 06/28/2021 0918   CO2 27 06/28/2021 0918   GLUCOSE 87 06/28/2021 0918   BUN 16 06/28/2021 0918   BUN 16 09/17/2019 1136   CREATININE 0.88 06/28/2021 0918   CALCIUM 9.5 06/28/2021 0918   GFRNONAA 71 09/17/2019 1136   GFRAA 81 09/17/2019 1136    Lipid Panel     Component Value Date/Time   CHOL 162 06/28/2021 0918   CHOL 165 09/17/2019 1136   TRIG 60 06/28/2021 0918   HDL 59 06/28/2021 0918   HDL 51 09/17/2019 1136   CHOLHDL 2.7 06/28/2021 0918   LDLCALC 88 06/28/2021 0918    CBC    Component Value Date/Time   WBC 6.6 06/28/2021 0918   RBC 4.85 06/28/2021 0918   HGB 14.0 06/28/2021 0918   HGB 14.9 09/17/2019 1136   HCT 43.1 06/28/2021 0918   HCT 43.8 09/17/2019 1136   PLT 231 06/28/2021 0918   PLT 250 09/17/2019 1136   MCV 88.9 06/28/2021 0918   MCV 87 09/17/2019 1136   MCH 28.9 06/28/2021 0918   MCHC 32.5 06/28/2021 0918   RDW 12.4 06/28/2021 0918   RDW 11.9 09/17/2019 1136   LYMPHSABS 2.1 09/17/2019 1136   EOSABS 0.2 09/17/2019 1136   BASOSABS 0.1 09/17/2019 1136    Hgb A1C No results found for: "HGBA1C"         Assessment & Plan:   Mass of Left Breast:  Will obtain diagnostic mammogram bilateral with ultrasound of the left breast  RTC in 2 months for annual exam Webb Silversmith, NP

## 2022-05-11 ENCOUNTER — Other Ambulatory Visit: Payer: Self-pay | Admitting: Internal Medicine

## 2022-05-11 ENCOUNTER — Ambulatory Visit
Admission: RE | Admit: 2022-05-11 | Discharge: 2022-05-11 | Disposition: A | Discharge status: Home / Self Care | Payer: 59 | Source: Ambulatory Visit | Attending: Internal Medicine | Admitting: Internal Medicine

## 2022-05-11 DIAGNOSIS — N6322 Unspecified lump in the left breast, upper inner quadrant: Secondary | ICD-10-CM | POA: Insufficient documentation

## 2022-05-11 DIAGNOSIS — N63 Unspecified lump in unspecified breast: Secondary | ICD-10-CM

## 2022-05-11 DIAGNOSIS — R928 Other abnormal and inconclusive findings on diagnostic imaging of breast: Secondary | ICD-10-CM

## 2022-05-17 ENCOUNTER — Ambulatory Visit
Admission: RE | Admit: 2022-05-17 | Discharge: 2022-05-17 | Disposition: A | Discharge status: Home / Self Care | Payer: 59 | Source: Ambulatory Visit | Attending: Internal Medicine | Admitting: Internal Medicine

## 2022-05-17 DIAGNOSIS — R928 Other abnormal and inconclusive findings on diagnostic imaging of breast: Secondary | ICD-10-CM

## 2022-05-17 DIAGNOSIS — C50212 Malignant neoplasm of upper-inner quadrant of left female breast: Secondary | ICD-10-CM | POA: Insufficient documentation

## 2022-05-17 DIAGNOSIS — N63 Unspecified lump in unspecified breast: Secondary | ICD-10-CM

## 2022-05-17 HISTORY — PX: BREAST BIOPSY: SHX20

## 2022-05-17 MED ORDER — LIDOCAINE-EPINEPHRINE 1 %-1:100000 IJ SOLN
8.0000 mL | Freq: Once | INTRAMUSCULAR | Status: AC
  Administered 2022-05-17: 8 mL
  Filled 2022-05-17: qty 8

## 2022-05-17 MED ORDER — LIDOCAINE HCL (PF) 1 % IJ SOLN
2.0000 mL | Freq: Once | INTRAMUSCULAR | Status: AC
  Administered 2022-05-17: 2 mL
  Filled 2022-05-17: qty 2

## 2022-05-19 ENCOUNTER — Encounter: Payer: Self-pay | Admitting: *Deleted

## 2022-05-19 ENCOUNTER — Other Ambulatory Visit: Payer: Self-pay | Admitting: Specialist

## 2022-05-19 DIAGNOSIS — C50919 Malignant neoplasm of unspecified site of unspecified female breast: Secondary | ICD-10-CM

## 2022-05-19 LAB — SURGICAL PATHOLOGY

## 2022-05-19 NOTE — Progress Notes (Signed)
Received referral for newly diagnosed breast cancer from [facility].  Navigation initiated.  Ms. Jennifer Garrison will be going out of town next week so she has requested a med onc appt. On Friday March 1.   She will see Dr. Darrall Dears.  She is also considering her options for surgeons but may wait until after she sees Dr. Darrall Dears to decide.

## 2022-05-20 ENCOUNTER — Other Ambulatory Visit: Payer: Self-pay | Admitting: Internal Medicine

## 2022-05-20 NOTE — Telephone Encounter (Signed)
Requested Prescriptions  Pending Prescriptions Disp Refills   buPROPion (WELLBUTRIN SR) 150 MG 12 hr tablet [Pharmacy Med Name: BUPROPION HCL SR TABS '150MG'$ ] 180 tablet 0    Sig: TAKE 1 TABLET TWICE A DAY     Psychiatry: Antidepressants - bupropion Passed - 05/20/2022 12:42 AM      Passed - Cr in normal range and within 360 days    Creat  Date Value Ref Range Status  06/28/2021 0.88 0.50 - 1.05 mg/dL Final         Passed - AST in normal range and within 360 days    AST  Date Value Ref Range Status  06/28/2021 16 10 - 35 U/L Final         Passed - ALT in normal range and within 360 days    ALT  Date Value Ref Range Status  06/28/2021 11 6 - 29 U/L Final         Passed - Completed PHQ-2 or PHQ-9 in the last 360 days      Passed - Last BP in normal range    BP Readings from Last 1 Encounters:  05/10/22 114/76         Passed - Valid encounter within last 6 months    Recent Outpatient Visits           1 week ago Mass of upper inner quadrant of left breast   [facility] [place], Coralie Keens, NP   4 months ago Age-related osteoporosis without current pathological fracture   [facility] [place], Coralie Keens, NP   5 months ago Dermatitis due to plants, including poison ivy, sumac, and oak   [place], PA-C   10 months ago Encounter for general adult medical examination with abnormal findings   [facility] [place], Coralie Keens, NP   1 year ago Sebaceous cyst   [facility] [place], Coralie Keens, NP       Future Appointments             In 2 months [place], Coralie Keens, NP [facility]

## 2022-05-27 ENCOUNTER — Encounter: Payer: Self-pay | Admitting: Internal Medicine

## 2022-05-27 ENCOUNTER — Encounter: Payer: Self-pay | Admitting: *Deleted

## 2022-05-27 ENCOUNTER — Inpatient Hospital Stay: Payer: 59 | Attending: Internal Medicine | Admitting: Internal Medicine

## 2022-05-27 ENCOUNTER — Inpatient Hospital Stay: Payer: 59

## 2022-05-27 VITALS — BP 128/64 | HR 72 | Temp 96.9°F | Resp 20 | Ht 65.0 in | Wt 114.8 lb

## 2022-05-27 DIAGNOSIS — Z171 Estrogen receptor negative status [ER-]: Secondary | ICD-10-CM | POA: Insufficient documentation

## 2022-05-27 DIAGNOSIS — Z79899 Other long term (current) drug therapy: Secondary | ICD-10-CM

## 2022-05-27 DIAGNOSIS — M810 Age-related osteoporosis without current pathological fracture: Secondary | ICD-10-CM | POA: Insufficient documentation

## 2022-05-27 DIAGNOSIS — C50919 Malignant neoplasm of unspecified site of unspecified female breast: Secondary | ICD-10-CM

## 2022-05-27 DIAGNOSIS — Z87891 Personal history of nicotine dependence: Secondary | ICD-10-CM | POA: Diagnosis not present

## 2022-05-27 DIAGNOSIS — D0512 Intraductal carcinoma in situ of left breast: Secondary | ICD-10-CM | POA: Insufficient documentation

## 2022-05-27 DIAGNOSIS — Z01818 Encounter for other preprocedural examination: Secondary | ICD-10-CM | POA: Insufficient documentation

## 2022-05-27 DIAGNOSIS — C50912 Malignant neoplasm of unspecified site of left female breast: Secondary | ICD-10-CM

## 2022-05-27 HISTORY — DX: Malignant neoplasm of unspecified site of unspecified female breast: C50.919

## 2022-05-27 NOTE — Progress Notes (Signed)
[place] CONSULT NOTE  Patient Care Team: Jearld Fenton, NP as PCP - General (Internal Medicine) Daiva Huge, RN as Oncology Nurse Navigator  REFERRING PROVIDER: Webb Silversmith  REASON FOR REFFERAL: breast cancer  CANCER STAGING   Cancer Staging  Breast cancer [facility]) Staging form: Breast, AJCC 8th Edition - Clinical stage from 05/17/2022: Stage IA (cT1b, cN0, cM0, G3, ER-, PR-, HER2+) - Signed by Jane Canary, MD on 05/27/2022 Stage prefix: Initial diagnosis Histologic grading system: 3 grade system   ASSESSMENT & PLAN:  Jennifer Garrison 62 y.o. female with pmh of osteoporosis and anxiety was referred to medical oncology for management of left breast stage Ia invasive ductal cancer.  # Left breast IDC stage IA, ER/PR negative, HER2 positive -Patient felt a palpable mass in her left breast.  Mammogram showed 1 cm irregular mass in her left breast at the 11 o'clock position 8 cm from the nipple.  Targeted ultrasound showed the same finding.  No abnormal lymphadenopathy. -Underwent biopsy of the mass which showed 10 mm IDC, grade 3, ER negative, PR negative and HER2 positive with 80% staining.  -Discussed with the patient in detail about the imaging findings, biopsy results, prognosis and treatment options.  Patient has T1b lesion and is HER2 positive.  Considering the lesion is small in size would prefer upfront surgery.  Then manage with adjuvant treatment based on APT trial with paclitaxel 80 mg/m and IV trastuzumab loading dose of 4 mg/kg subsequent dose 2 mg/kg weekly for 12 weeks followed by trastuzumab once every 3 weeks for 40 weeks to complete a full year of trastuzumab.  Side effects such as decreased blood count, need for blood transfusion, increased risk of infection, peripheral neuropathy, diarrhea, renal dysfunction, nausea, vomiting, decreased appetite, fatigue, cardiac dysfunction was discussed.  Will obtain echocardiogram for baseline cardiac function prior to  starting trastuzumab.  Discussed about the port placement.  -Our breast coordinator now will assist with making appointment with surgery.  If patient decides to proceed with lumpectomy, she will benefit from radiation.  Patient is hesitant to have systemic treatment with chemotherapy at this time.  Would like to focus on the surgery first and will wait for the final pathology to make the decision.  -Referral to genetics  REFERENCE - APT trial 410 patients were enrolled and 406 were given adjuvant paclitaxel and trastuzumab and included in the analysis. Mean age at enrolment was 51 years (SD 105), 405 (998%) of 406 patients were female and one (02%) was female, 350 (862%) were White, 28 (69%) were Black or African American, and 272 (670%) had hormone receptor-positive disease. After a median follow-up of 108 years (IQR 71-114), among 406 patients included in the analysis population, we observed 31 invasive disease-free survival events, of which six (194%) were locoregional ipsilateral recurrences, nine (290%) were new contralateral breast cancers, six (194%) were distant recurrences, and ten (323%) were all-cause deaths. 10-year invasive disease-free survival was 913% (95% CI 883-944), 10-year recurrence-free interval was 963% (95% CI 943-983), 10-year overall survival was 943% (95% CI 918-968), and 10-year breast cancer-specific survival was 988% (95% CI 976-100). HER2DX risk score as a continuous variable was significantly associated with invasive disease-free survival (hazard ratio [HR] per 10-unit increment 124 [95% CI 100-152]; p=0047) and recurrence-free interval (145 [109-193]; p=0011).  # Osteoporosis -On alendronate  No orders of the defined types were placed in this encounter.   The total time spent in the appointment was 60 minutes encounter with patients including  review of chart and various tests results, discussions about plan of care and coordination of care  plan   All questions were answered. The patient knows to call the clinic with any problems, questions or concerns. No barriers to learning was detected.  Jane Canary, MD 3/1/202412:35 PM   HISTORY OF PRESENTING ILLNESS:  Jennifer Garrison 62 y.o. female with pmh of osteoporosis on alendronate and anxiety was referred to medical oncology for further management of stage Ia left breast invasive ductal cancer HER2 positive  Patient felt a palpable mass in her left breast mid February 2024.  She sought medical attention from PCP.  Further imaging as below.  I have reviewed her chart and materials related to her cancer extensively and collaborated history with the patient. Summary of oncologic history is as follows: Oncology History  Breast cancer ([place])  05/11/2022 Mammogram   Diagnostic mammogram and ultrasound- FINDINGS: Full field and spot compression views of the LEFT breast demonstrate a 1 cm irregular mass within the UPPER INNER LEFT breast, at the site of patient concern. The patient has retropectoral implants.   On physical exam, a firm palpable mass identified at the 11 o'clock position of the LEFT breast 8 cm from the nipple.   Targeted ultrasound is performed, showing a 0.9 x 0.8 x 1 cm irregular hypoechoic mass at the 11 o'clock position of the LEFT breast 8 cm from the nipple.   No abnormal LEFT axillary lymph nodes are noted.   IMPRESSION: 1. Suspicious 1 cm UPPER INNER LEFT breast mass. Tissue sampling is recommended. 2. No abnormal appearing LEFT axillary lymph nodes.   05/17/2022 Cancer Staging   Staging form: Breast, AJCC 8th Edition - Clinical stage from 05/17/2022: Stage IA (cT1b, cN0, cM0, G3, ER-, PR-, HER2+) - Signed by Jane Canary, MD on 05/27/2022 Stage prefix: Initial diagnosis Histologic grading system: 3 grade system   05/17/2022 Initial Biopsy   DIAGNOSIS:  A. BREAST, LEFT, 11:00, 8 CM FROM NIPPLE, ULTRASOUND-GUIDED BIOPSY:   - INVASIVE MAMMARY  CARCINOMA, NOS DUCTAL.   Size of invasive carcinoma: 10 mm in this sample  Histologic grade of invasive carcinoma: Grade 3                       Glandular/tubular differentiation score: 3                       Nuclear pleomorphism score: 3                       Mitotic rate score: 2                       Total score: 8  Ductal carcinoma in situ: Not identified  Lymphovascular invasion: Not identified   Estrogen Receptor (ER) Status: NEGATIVE (LESS THAN 1%)   Progesterone Receptor (PgR) Status: NEGATIVE (LESS THAN 1%)   HER2 (by immunohistochemistry): POSITIVE (Score 3+)       Percentage of cells with uniform intense complete membrane  staining: 80%  Ki-67: Not performed     Menarche age 76 or 68 Age at first birth 14 Used birth control pills and Depo more than 5 years Menopause was age 54 Hysterectomy yes for prolapsed uterus HRT no History of breast biopsies yes for breast cyst in left lower Family history of lung cancer in mother who was smoker  MEDICAL HISTORY:  Past Medical History:  Diagnosis Date  Allergy    Anxiety    Breast cancer ([place]) 05/27/2022   IBS (irritable bowel syndrome)    Trigger finger    Left Middle Finger 11/2016    SURGICAL HISTORY: Past Surgical History:  Procedure Laterality Date   ABDOMINAL HYSTERECTOMY  02/2016   Only cervix remains (done for prolapse)   AUGMENTATION MAMMAPLASTY Bilateral 2010   BREAST BIOPSY Left 05/17/2022   [place] BX, ribbon clip, path pending   BREAST BIOPSY Left 05/17/2022   [place] LT BREAST BX W LOC DEV 1ST LESION IMG BX SPEC US GUIDE 05/17/2022 [facility]   BREAST CYST ASPIRATION Left 2010   neg   BREAST ENHANCEMENT SURGERY  1998   CHOLECYSTECTOMY     COLONOSCOPY WITH PROPOFOL N/A 10/10/2019   Procedure: COLONOSCOPY WITH PROPOFOL;  Surgeon: Jonathon Bellows, MD;  [facility] ENDOSCOPY;  Service: Gastroenterology;  Laterality: N/A;   TYMPANOPLASTY Left     SOCIAL HISTORY: Social History   Socioeconomic History    Marital status: Married    Spouse name: Not on file   Number of children: 1   Years of education: Not on file   Highest education level: Not on file  Occupational History   Not on file  Tobacco Use   Smoking status: Former    Types: Cigarettes    Quit date: 09/09/1992    Years since quitting: 29.7   Smokeless tobacco: Current    Types: Chew   Tobacco comments:    Chewing tobacco   Vaping Use   Vaping Use: Former  Substance and Sexual Activity   Alcohol use: Yes    Comment: socially    Drug use: Yes    Types: Marijuana   Sexual activity: Yes    Birth control/protection: None  Other Topics Concern   Not on file  Social History Narrative   Not on file   Social Determinants of Health   Financial Resource Strain: Not on file  Food Insecurity: No Food Insecurity (05/27/2022)   Hunger Vital Sign    Worried About Running Out of Food in the Last Year: Never true    Ran Out of Food in the Last Year: Never true  Transportation Needs: No Transportation Needs (05/27/2022)   PRAPARE - Hydrologist (Medical): No    Lack of Transportation (Non-Medical): No  Physical Activity: Not on file  Stress: Not on file  Social Connections: Not on file  Intimate Partner Violence: Not At Risk (05/27/2022)   Humiliation, Afraid, Rape, and Kick questionnaire    Fear of Current or Ex-Partner: No    Emotionally Abused: No    Physically Abused: No    Sexually Abused: No    FAMILY HISTORY: Family History  Problem Relation Age of Onset   Lung cancer Mother    Hypertension Mother    Lupus Father    Breast cancer Neg Hx     ALLERGIES:  is allergic to poison ivy extract.  MEDICATIONS:  Current Outpatient Medications  Medication Sig Dispense Refill   alendronate (FOSAMAX) 70 MG tablet Take 70 mg by mouth once a week.     buPROPion (WELLBUTRIN SR) 150 MG 12 hr tablet TAKE 1 TABLET TWICE A DAY 180 tablet 0   busPIRone (BUSPAR) 5 MG tablet Take 1 tablet (5 mg total)  by mouth 2 (two) times daily as needed. 60 tablet 2   ibuprofen (ADVIL) 600 MG tablet Take 1 tablet (600 mg total) by mouth every 6 (six) hours as  needed. 30 tablet 0   metroNIDAZOLE (METROCREAM) 0.75 % cream Apply topically 2 (two) times daily. 45 g 0   triamcinolone cream (KENALOG) 0.1 % Apply 1 Application topically 2 (two) times daily. 30 g 0   No current facility-administered medications for this visit.    REVIEW OF SYSTEMS:   Pertinent information mentioned in HPI All other systems were reviewed with the patient and are negative.  PHYSICAL EXAMINATION: ECOG PERFORMANCE STATUS: 0 - Asymptomatic  Vitals:   05/27/22 1105  BP: 128/64  Pulse: 72  Resp: 20  Temp: (!) 96.9 F (36.1 C)  SpO2: 100%   Filed Weights   05/27/22 1105  Weight: 114 lb 12.8 oz (52.1 kg)    GENERAL:alert, no distress and comfortable SKIN: skin color, texture, turgor are normal, no rashes or significant lesions EYES: normal, conjunctiva are pink and non-injected, sclera clear OROPHARYNX:no exudate, no erythema and lips, buccal mucosa, and tongue normal  NECK: supple, thyroid normal size, non-tender, without nodularity LYMPH:  no palpable lymphadenopathy in the cervical, axillary or inguinal LUNGS: clear to auscultation and percussion with normal breathing effort HEART: regular rate & rhythm and no murmurs and no lower extremity edema ABDOMEN:abdomen soft, non-tender and normal bowel sounds Musculoskeletal:no cyanosis of digits and no clubbing  PSYCH: alert & oriented x 3 with fluent speech NEURO: no focal motor/sensory deficits  LABORATORY DATA:  I have reviewed the data as listed Lab Results  Component Value Date   WBC 6.6 06/28/2021   HGB 14.0 06/28/2021   HCT 43.1 06/28/2021   MCV 88.9 06/28/2021   PLT 231 06/28/2021   Recent Labs    06/28/21 0918  NA 142  K 4.6  CL 106  CO2 27  GLUCOSE 87  BUN 16  CREATININE 0.88  CALCIUM 9.5  PROT 6.5  AST 16  ALT 11  BILITOT 0.6     RADIOGRAPHIC STUDIES: I have personally reviewed the radiological images as listed and agreed with the findings in the report. [place] LT BREAST BX W LOC DEV 1ST LESION IMG BX SPEC US GUIDE  Addendum Date: 05/24/2022   ADDENDUM REPORT: 05/24/2022 07:35 ADDENDUM: PATHOLOGY revealed: A. BREAST, LEFT, 11:00, 8 CM FROM NIPPLE, ULTRASOUND-GUIDED BIOPSY: - INVASIVE MAMMARY CARCINOMA, NOS DUCTAL. 10 mm in this sample. Grade 3. Ductal carcinoma in situ: Not identified. Lymphovascular invasion: Not identified. Pathology results are CONCORDANT with imaging findings, per Dr. Everlean Alstrom. Pathology results and recommendations were discussed with patient via telephone on 05/18/2022. Patient reported biopsy site doing well with no adverse symptoms, and only slight tenderness at the site. Post biopsy care instructions were reviewed, questions were answered and my direct phone number was provided. Patient was instructed to call [facility] for any additional questions or concerns related to biopsy site. RECOMMENDATIONS: 1. Surgical and oncological consultation. Request for surgical and oncological consultation relayed to Casper Harrison RN at [facility] by Electa Sniff RN on 05/18/2022. Pathology results reported by Electa Sniff RN on 05/18/2022. Electronically Signed   By: Everlean Alstrom M.D.   On: 05/24/2022 07:35   Result Date: 05/24/2022 CLINICAL DATA:  Patient presents for ultrasound-guided core biopsy of a palpable mass in the left breast at the 11 o'clock position. EXAM: ULTRASOUND GUIDED LEFT BREAST CORE NEEDLE BIOPSY COMPARISON:  Previous exam(s). PROCEDURE: I met with the patient and we discussed the procedure of ultrasound-guided biopsy, including benefits and alternatives. We discussed the high likelihood of a successful procedure. We discussed the risks of the  procedure, including infection, bleeding, tissue injury, clip migration, and inadequate sampling. Informed written consent was  given. The usual time-out protocol was performed immediately prior to the procedure. Lesion quadrant: Upper-outer Using sterile technique and 1% Lidocaine as local anesthetic, under direct ultrasound visualization, a 14 gauge spring-loaded device was used to perform biopsy of the mass in the left breast at 11 o'clock 8 cm from nipple using a inferolateral to superomedial approach. At the conclusion of the procedure a heart shaped tissue marker clip was deployed into the biopsy cavity. Follow up 2 view mammogram was performed and dictated separately. IMPRESSION: Ultrasound guided biopsy of the mass in the left breast 11 o'clock 8 cm from nipple. No apparent complications. Electronically Signed: By: Everlean Alstrom M.D. On: 05/17/2022 09:16   MM CLIP PLACEMENT LEFT  Result Date: 05/17/2022 CLINICAL DATA:  Post ultrasound-guided core biopsy of a mass in the left breast at the 11 o'clock position. EXAM: 3D DIAGNOSTIC LEFT MAMMOGRAM POST ULTRASOUND BIOPSY COMPARISON:  Previous exam(s). FINDINGS: 3D Mammographic images were obtained following ultrasound-guided core biopsy of a mass in the left breast at the 11 o'clock position. A heart shaped biopsy marking clip is present the site of the biopsied mass in the left breast at the 11 o'clock position. IMPRESSION: Heart shaped biopsy marking clip at site of biopsied mass in the left breast at the 11 o'clock position. Final Assessment: Post Procedure Mammograms for Marker Placement Electronically Signed   By: Everlean Alstrom M.D.   On: 05/17/2022 09:16  US BREAST LTD UNI LEFT INC AXILLA  Result Date: 05/11/2022 CLINICAL DATA:  62 year old female with palpable LEFT breast mass identified on self-examination. EXAM: DIGITAL DIAGNOSTIC UNILATERAL LEFT MAMMOGRAM WITH IMPLANTS, TOMOSYNTHESIS; ULTRASOUND LEFT BREAST LIMITED TECHNIQUE: Left digital diagnostic mammography and breast tomosynthesis was performed. Standard and/or implant displaced views were performed.; Targeted  ultrasound examination of the left breast was performed. COMPARISON:  Previous exam(s). ACR Breast Density Category b: There are scattered areas of fibroglandular density. FINDINGS: Full field and spot compression views of the LEFT breast demonstrate a 1 cm irregular mass within the UPPER INNER LEFT breast, at the site of patient concern. The patient has retropectoral implants. On physical exam, a firm palpable mass identified at the 11 o'clock position of the LEFT breast 8 cm from the nipple. Targeted ultrasound is performed, showing a 0.9 x 0.8 x 1 cm irregular hypoechoic mass at the 11 o'clock position of the LEFT breast 8 cm from the nipple. No abnormal LEFT axillary lymph nodes are noted. IMPRESSION: 1. Suspicious 1 cm UPPER INNER LEFT breast mass. Tissue sampling is recommended. 2. No abnormal appearing LEFT axillary lymph nodes. RECOMMENDATION: Ultrasound-guided LEFT breast biopsy, which will be arranged. I have discussed the findings and recommendations with the patient. If applicable, a reminder letter will be sent to the patient regarding the next appointment. BI-RADS CATEGORY  4: Suspicious. Electronically Signed   By: Margarette Canada M.D.   On: 05/11/2022 10:28  MM DIAG BREAST W/IMPLANT TOMO UNI L  Result Date: 05/11/2022 CLINICAL DATA:  62 year old female with palpable LEFT breast mass identified on self-examination. EXAM: DIGITAL DIAGNOSTIC UNILATERAL LEFT MAMMOGRAM WITH IMPLANTS, TOMOSYNTHESIS; ULTRASOUND LEFT BREAST LIMITED TECHNIQUE: Left digital diagnostic mammography and breast tomosynthesis was performed. Standard and/or implant displaced views were performed.; Targeted ultrasound examination of the left breast was performed. COMPARISON:  Previous exam(s). ACR Breast Density Category b: There are scattered areas of fibroglandular density. FINDINGS: Full field and spot compression views of the LEFT breast  demonstrate a 1 cm irregular mass within the UPPER INNER LEFT breast, at the site of patient  concern. The patient has retropectoral implants. On physical exam, a firm palpable mass identified at the 11 o'clock position of the LEFT breast 8 cm from the nipple. Targeted ultrasound is performed, showing a 0.9 x 0.8 x 1 cm irregular hypoechoic mass at the 11 o'clock position of the LEFT breast 8 cm from the nipple. No abnormal LEFT axillary lymph nodes are noted. IMPRESSION: 1. Suspicious 1 cm UPPER INNER LEFT breast mass. Tissue sampling is recommended. 2. No abnormal appearing LEFT axillary lymph nodes. RECOMMENDATION: Ultrasound-guided LEFT breast biopsy, which will be arranged. I have discussed the findings and recommendations with the patient. If applicable, a reminder letter will be sent to the patient regarding the next appointment. BI-RADS CATEGORY  4: Suspicious. Electronically Signed   By: Margarette Canada M.D.   On: 05/11/2022 10:28

## 2022-05-27 NOTE — Progress Notes (Signed)
Accompanied patient and family to initial medical oncology appointment.   Reviewed Breast Cancer treatment handbook.   Care plan summary given to patient.   Reviewed outreach programs and cancer center services. Appt. Scheduled for surgical consultation with Dr. [name] on Tuesday 3/5 at 1:30.

## 2022-05-31 ENCOUNTER — Ambulatory Visit: Payer: Self-pay | Admitting: Surgery

## 2022-05-31 NOTE — H&P (Signed)
Subjective:   CC: Malignant neoplasm of upper-inner quadrant of left breast in female, estrogen receptor negative  [C50.212, Z17.1] HPI:  Jennifer Garrison is a 62 y.o. female who was referred by [facility] for evaluation of above. Change was noted on last mammogram, after patient felt palpable lump Patient does routinely do self breast exams.  Patient has noted a change on breast exam.    Past Medical History:  has no past medical history on file.  Past Surgical History:  has a past surgical history that includes Cholecystectomy; Hysterectomy; tympanoplasty (Left); and bilateral breast augmentation (Bilateral).  Family History: family history is not on file.  Social History:  reports that she does not have a smoking history on file. Her smokeless tobacco use includes chew. She reports current alcohol use. No history on file for drug use.  Current Medications: has a current medication list which includes the following prescription(s): alendronate, bupropion, buspirone, ibuprofen, and metronidazole.  Allergies:  Allergies as of 05/31/2022 - Reviewed 05/31/2022  Allergen Reaction Noted   Poison ivy extract Rash 11/25/2021    ROS:  A 15 point review of systems was performed and was negative except as noted in HPI   Objective:     BP (!) 140/75   Pulse 83   Ht 162.6 cm ('5\' 4"'$ )   Wt 51.3 kg (113 lb)   BMI 19.40 kg/m   Constitutional :  No distress, cooperative, alert  Lymphatics/Throat:  Supple with no lymphadenopathy  Respiratory:  Clear to auscultation bilaterally  Cardiovascular:  Regular rate and rhythm  Gastrointestinal: Soft, non-tender, non-distended, no organomegaly.  Musculoskeletal: Steady gait and movement  Skin: Cool and moist.  Psychiatric: Normal affect, non-agitated, not confused  Breast: Normal appearance and no palpable abnormality in left breast and axilla.  Right side with easily palpable mass at 11 oclock position around nipple areolar border, on top of implants.  No  obvious lymphadenopathy in axilla. Chaperone present for exam.      LABS:  Reason for Addendum #1:  Breast Biomarker Results   Specimen Submitted:  A. Breast, left   Clinical History: Heart shaped biopsy clip deployed.       DIAGNOSIS:  A. BREAST, LEFT, 11:00, 8 CM FROM NIPPLE, ULTRASOUND-GUIDED BIOPSY:   - INVASIVE MAMMARY CARCINOMA, NOS DUCTAL.   Size of invasive carcinoma: 10 mm in this sample  Histologic grade of invasive carcinoma: Grade 3                       Glandular/tubular differentiation score: 3                       Nuclear pleomorphism score: 3                       Mitotic rate score: 2                       Total score: 8  Ductal carcinoma in situ: Not identified  Lymphovascular invasion: Not identified   ER/PR/HER2: Immunohistochemistry will be performed on block A1, with  reflex to [place] for HER2 2+. The results will be reported in an addendum.    GROSS DESCRIPTION:  A. Labeled: Left breast 11:00 8 cm from nipple  Received: Formalin  Time/date in fixative: Collected and placed in formalin at 8:35 AM on  05/17/2022  Cold ischemic time: Less than 1 minute  Total fixation time: Approximately 8.75 hours  Core pieces: 5 cores and multiple additional fragments  Size: Range from 0.5-1.5 cm in length and 0.2 cm in diameter  Description: Received are cores and fragments of yellow-white, focally  hemorrhagic, fibrofatty tissue.  The additional fragments are 0.5 x 0.2  x 0.1 cm in aggregate.  Ink color: Blue  Entirely submitted in cassettes 1-2 with 3 cores in cassette 1 and 2  cores with the remaining fragments in cassette 2.   RB 05/17/2022   Final Diagnosis performed by Raynelle Bring, MD.   Electronically signed  05/18/2022 9:08:33AM  The electronic signature indicates that the named Attending Pathologist  has evaluated the specimen  Technical component performed at [facility], [address], [place],  [place] [address] Lab: [phone] Dir: Rush Farmer,  MD, MMM   Professional component performed at [facility], [facility], [place], [place], [place] [address] Lab: [phone]  Dir: Kathi Simpers, MD   ADDENDUM:  CASE SUMMARY: BREAST BIOMARKER TESTS  Estrogen Receptor (ER) Status: NEGATIVE (LESS THAN 1%)          Internal control cells absent  No internal controls are present, but external controls are  appropriately positive.  If needed, testing another specimen that  contains internal controls may be warranted for confirmation of the ER  status.  A second pathologist review was performed by Dr. Allena Napoleon.   Progesterone Receptor (PgR) Status: NEGATIVE (LESS THAN 1%)          Internal control cells absent    HER2 (by immunohistochemistry): POSITIVE (Score 3+)       Percentage of cells with uniform intense complete membrane  staining: 80%  Ki-67: Not performed   Cold Ischemia and Fixation Times: Meet requirements specified in latest  version of the ASCO/CAP guidelines  Testing Performed on Block Number(s): [default value]   METHODS  Fixative: Formalin  Estrogen Receptor:  FDA cleared ([place]) Primary Antibody:  SP1  Progesterone Receptor: FDA cleared ([place]) Primary Antibody: 1E2  HER2 (by IHC): FDA approved ([place]) Primary Antibody: 4B5 (PATHWAY)  Immunohistochemistry controls worked appropriately. Slides were prepared  by [facility], [place], and interpreted by [facility] value].  (v1.5.0.1)   Addendum #1 performed by Raynelle Bring, MD.   Electronically signed  05/19/2022 8:23:52AM  The electronic signature indicates that the named Attending Pathologist  has evaluated the specimen  Technical component performed at [facility], [address] [place],   [address] Lab: [phone] Dir: Rush Farmer, MD, MMM   Professional component performed at [facility], [facility], [place], [place],  [address] Lab: [phone]  Dir: Kathi Simpers, MD     RADS: CLINICAL DATA:  62 year old female with palpable LEFT breast mass  identified on self-examination.   EXAM:  DIGITAL DIAGNOSTIC UNILATERAL LEFT MAMMOGRAM WITH IMPLANTS,  TOMOSYNTHESIS; ULTRASOUND LEFT BREAST LIMITED   TECHNIQUE:  Left digital diagnostic mammography and breast tomosynthesis was  performed. Standard and/or implant displaced views were performed.;  Targeted ultrasound examination of the left breast was performed.   COMPARISON:  Previous exam(s).   ACR Breast Density Category b: There are scattered areas of  fibroglandular density.   FINDINGS:  Full field and spot compression views of the LEFT breast demonstrate  a 1 cm irregular mass within the UPPER INNER LEFT breast, at the  site of patient concern. The patient has retropectoral implants.   On physical exam, a firm palpable mass identified at the 11 o'clock  position of the LEFT breast 8 cm from the  nipple.   Targeted ultrasound is performed, showing a 0.9 x 0.8 x 1 cm  irregular hypoechoic mass at the 11 o'clock position of the LEFT  breast 8 cm from the nipple.   No abnormal LEFT axillary lymph nodes are noted.   IMPRESSION:  1. Suspicious 1 cm UPPER INNER LEFT breast mass. Tissue sampling is  recommended.  2. No abnormal appearing LEFT axillary lymph nodes.   RECOMMENDATION:  Ultrasound-guided LEFT breast biopsy, which will be arranged.   I have discussed the findings and recommendations with the patient.  If applicable, a reminder letter will be sent to the patient  regarding the next appointment.   BI-RADS CATEGORY  4: Suspicious.    Electronically Signed    By: Margarette Canada M.D.    On: 05/11/2022 10:28   Assessment:   Malignant neoplasm of upper-inner quadrant of left breast in female, estrogen receptor negative  [C50.212, Z17.1]  Plan:     1. Malignant neoplasm of upper-inner quadrant of left breast in female, estrogen receptor negative  [C50.212, Z17.1]  Discussed the risk of  surgery including recurrence, chronic pain, post-op infxn, poor/delayed wound healing, poor cosmesis, seroma, hematoma formation, and possible re-operation to address said risks. The risks of general anesthetic, if used, includes MI, CVA, sudden death or even reaction to anesthetic medications also discussed.  Typical post-op recovery time and possbility of activity restrictions were also discussed.  Alternatives include continued observation.  Benefits include possible symptom relief, pathologic evaluation, and/or curative excision.   The patient verbalized understanding and all questions were answered to the patient's satisfaction.  2. Patient has elected to proceed with surgical treatment. Lumpectomy, SNLB. No need for  RF tag placement due to easy palpation of tumor.  Procedure will be scheduled.    labs/images/medications/previous chart entries reviewed personally and relevant changes/updates noted above.

## 2022-05-31 NOTE — H&P (View-Only) (Signed)
Subjective:   CC: Malignant neoplasm of upper-inner quadrant of left breast in female, estrogen receptor negative  [C50.212, Z17.1] HPI:  Jennifer Garrison is a 62 y.o. female who was referred by [facility] for evaluation of above. Change was noted on last mammogram, after patient felt palpable lump Patient does routinely do self breast exams.  Patient has noted a change on breast exam.    Past Medical History:  has no past medical history on file.  Past Surgical History:  has a past surgical history that includes Cholecystectomy; Hysterectomy; tympanoplasty (Left); and bilateral breast augmentation (Bilateral).  Family History: family history is not on file.  Social History:  reports that she does not have a smoking history on file. Her smokeless tobacco use includes chew. She reports current alcohol use. No history on file for drug use.  Current Medications: has a current medication list which includes the following prescription(s): alendronate, bupropion, buspirone, ibuprofen, and metronidazole.  Allergies:  Allergies as of 05/31/2022 - Reviewed 05/31/2022  Allergen Reaction Noted   Poison ivy extract Rash 11/25/2021    ROS:  A 15 point review of systems was performed and was negative except as noted in HPI   Objective:     BP (!) 140/75   Pulse 83   Ht 162.6 cm (5' 4")   Wt 51.3 kg (113 lb)   BMI 19.40 kg/m   Constitutional :  No distress, cooperative, alert  Lymphatics/Throat:  Supple with no lymphadenopathy  Respiratory:  Clear to auscultation bilaterally  Cardiovascular:  Regular rate and rhythm  Gastrointestinal: Soft, non-tender, non-distended, no organomegaly.  Musculoskeletal: Steady gait and movement  Skin: Cool and moist.  Psychiatric: Normal affect, non-agitated, not confused  Breast: Normal appearance and no palpable abnormality in left breast and axilla.  Right side with easily palpable mass at 11 oclock position around nipple areolar border, on top of implants.  No  obvious lymphadenopathy in axilla. Chaperone present for exam.      LABS:  Reason for Addendum #1:  Breast Biomarker Results   Specimen Submitted:  A. Breast, left   Clinical History: Heart shaped biopsy clip deployed.       DIAGNOSIS:  A. BREAST, LEFT, 11:00, 8 CM FROM NIPPLE, ULTRASOUND-GUIDED BIOPSY:   - INVASIVE MAMMARY CARCINOMA, NOS DUCTAL.   Size of invasive carcinoma: 10 mm in this sample  Histologic grade of invasive carcinoma: Grade 3                       Glandular/tubular differentiation score: 3                       Nuclear pleomorphism score: 3                       Mitotic rate score: 2                       Total score: 8  Ductal carcinoma in situ: Not identified  Lymphovascular invasion: Not identified   ER/PR/HER2: Immunohistochemistry will be performed on block A1, with  reflex to FISH for HER2 2+. The results will be reported in an addendum.    GROSS DESCRIPTION:  A. Labeled: Left breast 11:00 8 cm from nipple  Received: Formalin  Time/date in fixative: Collected and placed in formalin at 8:35 AM on  05/17/2022  Cold ischemic time: Less than 1 minute  Total fixation time: Approximately 8.75 hours    Core pieces: 5 cores and multiple additional fragments  Size: Range from 0.5-1.5 cm in length and 0.2 cm in diameter  Description: Received are cores and fragments of yellow-white, focally  hemorrhagic, fibrofatty tissue.  The additional fragments are 0.5 x 0.2  x 0.1 cm in aggregate.  Ink color: Blue  Entirely submitted in cassettes 1-2 with 3 cores in cassette 1 and 2  cores with the remaining fragments in cassette 2.   RB 05/17/2022   Final Diagnosis performed by Ernest Andree, MD.   Electronically signed  05/18/2022 9:08:33AM  The electronic signature indicates that the named Attending Pathologist  has evaluated the specimen  Technical component performed at [facility], [address], [place],  [place] [address] Lab: [phone] Dir: Sanjai Nagendra,  MD, MMM   Professional component performed at [facility], [facility], [address], [place], [place] [address] Lab: [phone]  Dir: Heath M. Jones, MD   ADDENDUM:  CASE SUMMARY: BREAST BIOMARKER TESTS  Estrogen Receptor (ER) Status: NEGATIVE (LESS THAN 1%)          Internal control cells absent  No internal controls are present, but external controls are  appropriately positive.  If needed, testing another specimen that  contains internal controls may be warranted for confirmation of the ER  status.  A second pathologist review was performed by Dr. Heath Jones.   Progesterone Receptor (PgR) Status: NEGATIVE (LESS THAN 1%)          Internal control cells absent    HER2 (by immunohistochemistry): POSITIVE (Score 3+)       Percentage of cells with uniform intense complete membrane  staining: 80%  Ki-67: Not performed   Cold Ischemia and Fixation Times: Meet requirements specified in latest  version of the ASCO/CAP guidelines  Testing Performed on Block Number(s): [default value]   METHODS  Fixative: Formalin  Estrogen Receptor:  FDA cleared ([place]) Primary Antibody:  SP1  Progesterone Receptor: FDA cleared ([place]) Primary Antibody: 1E2  HER2 (by IHC): FDA approved ([place]) Primary Antibody: 4B5 (PATHWAY)  Immunohistochemistry controls worked appropriately. Slides were prepared  by [facility] [place], [place], and interpreted by [default value].  (v1.5.0.1)   Addendum #1 performed by Ernest Andree, MD.   Electronically signed  05/19/2022 8:23:52AM  The electronic signature indicates that the named Attending Pathologist  has evaluated the specimen  Technical component performed at [facility], [address], [place],  [place] [address] Lab: [phone] Dir: Sanjai Nagendra, MD, MMM   Professional component performed at [facility], [facility], [address], [place], [place] [address] Lab: [phone]  Dir: Heath M. Jones, MD     RADS: CLINICAL DATA:  62-year-old female with palpable LEFT breast mass  identified on self-examination.   EXAM:  DIGITAL DIAGNOSTIC UNILATERAL LEFT MAMMOGRAM WITH IMPLANTS,  TOMOSYNTHESIS; ULTRASOUND LEFT BREAST LIMITED   TECHNIQUE:  Left digital diagnostic mammography and breast tomosynthesis was  performed. Standard and/or implant displaced views were performed.;  Targeted ultrasound examination of the left breast was performed.   COMPARISON:  Previous exam(s).   ACR Breast Density Category b: There are scattered areas of  fibroglandular density.   FINDINGS:  Full field and spot compression views of the LEFT breast demonstrate  a 1 cm irregular mass within the UPPER INNER LEFT breast, at the  site of patient concern. The patient has retropectoral implants.   On physical exam, a firm palpable mass identified at the 11 o'clock  position of the LEFT breast 8 cm from the   nipple.   Targeted ultrasound is performed, showing a 0.9 x 0.8 x 1 cm  irregular hypoechoic mass at the 11 o'clock position of the LEFT  breast 8 cm from the nipple.   No abnormal LEFT axillary lymph nodes are noted.   IMPRESSION:  1. Suspicious 1 cm UPPER INNER LEFT breast mass. Tissue sampling is  recommended.  2. No abnormal appearing LEFT axillary lymph nodes.   RECOMMENDATION:  Ultrasound-guided LEFT breast biopsy, which will be arranged.   I have discussed the findings and recommendations with the patient.  If applicable, a reminder letter will be sent to the patient  regarding the next appointment.   BI-RADS CATEGORY  4: Suspicious.    Electronically Signed    By: Jeffrey  Hu M.D.    On: 05/11/2022 10:28   Assessment:   Malignant neoplasm of upper-inner quadrant of left breast in female, estrogen receptor negative  [C50.212, Z17.1]  Plan:     1. Malignant neoplasm of upper-inner quadrant of left breast in female, estrogen receptor negative  [C50.212, Z17.1]  Discussed the risk of  surgery including recurrence, chronic pain, post-op infxn, poor/delayed wound healing, poor cosmesis, seroma, hematoma formation, and possible re-operation to address said risks. The risks of general anesthetic, if used, includes MI, CVA, sudden death or even reaction to anesthetic medications also discussed.  Typical post-op recovery time and possbility of activity restrictions were also discussed.  Alternatives include continued observation.  Benefits include possible symptom relief, pathologic evaluation, and/or curative excision.   The patient verbalized understanding and all questions were answered to the patient's satisfaction.  2. Patient has elected to proceed with surgical treatment. Lumpectomy, SNLB. No need for  RF tag placement due to easy palpation of tumor.  Procedure will be scheduled.    labs/images/medications/previous chart entries reviewed personally and relevant changes/updates noted above.  

## 2022-06-01 ENCOUNTER — Encounter: Payer: Self-pay | Admitting: *Deleted

## 2022-06-01 ENCOUNTER — Other Ambulatory Visit: Payer: Self-pay | Admitting: Surgery

## 2022-06-01 DIAGNOSIS — Z171 Estrogen receptor negative status [ER-]: Secondary | ICD-10-CM

## 2022-06-01 NOTE — Progress Notes (Signed)
Lumpectomy is scheduled for 3/14.  Jennifer Garrison will see Dr. Darrall Dears 2 weeks after on 3/28.  Appt. Details given to her.

## 2022-06-02 ENCOUNTER — Encounter: Payer: Self-pay | Admitting: Licensed Clinical Social Worker

## 2022-06-02 ENCOUNTER — Inpatient Hospital Stay: Payer: 59

## 2022-06-02 ENCOUNTER — Inpatient Hospital Stay (HOSPITAL_BASED_OUTPATIENT_CLINIC_OR_DEPARTMENT_OTHER): Payer: 59 | Admitting: Licensed Clinical Social Worker

## 2022-06-02 DIAGNOSIS — Z801 Family history of malignant neoplasm of trachea, bronchus and lung: Secondary | ICD-10-CM

## 2022-06-02 DIAGNOSIS — C50912 Malignant neoplasm of unspecified site of left female breast: Secondary | ICD-10-CM | POA: Diagnosis not present

## 2022-06-02 DIAGNOSIS — Z171 Estrogen receptor negative status [ER-]: Secondary | ICD-10-CM

## 2022-06-02 NOTE — Progress Notes (Signed)
REFERRING PROVIDER: Jane Canary, MD [place],  [place] [address]  PRIMARY PROVIDER:  Jearld Fenton, NP  PRIMARY REASON FOR VISIT:  1. Malignant neoplasm of left breast in female, estrogen receptor negative, unspecified site of breast ([place])   2. Family history of lung cancer      HISTORY OF PRESENT ILLNESS:   Ms. Yeats, a 62 y.o. female, was seen for a [place] cancer genetics consultation at the request of Dr. Darrall Dears due to a personal history of breast cancer.  Ms. Blick presents to clinic today to discuss the possibility of a hereditary predisposition to cancer, genetic testing, and to further clarify her future cancer risks, as well as potential cancer risks for family members.   CANCER HISTORY:  In 2024, at the age of 61, Ms. Maradiaga was diagnosed with invasive ductal carcinoma of the left breast, ER/PR-, HER2+. She is scheduled for lumpectomy 06/09/2022.   Oncology History  Breast cancer ([place])  05/11/2022 Mammogram   Diagnostic mammogram and ultrasound- FINDINGS: Full field and spot compression views of the LEFT breast demonstrate a 1 cm irregular mass within the UPPER INNER LEFT breast, at the site of patient concern. The patient has retropectoral implants.   On physical exam, a firm palpable mass identified at the 11 o'clock position of the LEFT breast 8 cm from the nipple.   Targeted ultrasound is performed, showing a 0.9 x 0.8 x 1 cm irregular hypoechoic mass at the 11 o'clock position of the LEFT breast 8 cm from the nipple.   No abnormal LEFT axillary lymph nodes are noted.   IMPRESSION: 1. Suspicious 1 cm UPPER INNER LEFT breast mass. Tissue sampling is recommended. 2. No abnormal appearing LEFT axillary lymph nodes.   05/17/2022 Cancer Staging   Staging form: Breast, AJCC 8th Edition - Clinical stage from 05/17/2022: Stage IA (cT1b, cN0, cM0, G3, ER-, PR-, HER2+) - Signed by Jane Canary, MD on 05/27/2022 Stage prefix: Initial  diagnosis Histologic grading system: 3 grade system   05/17/2022 Initial Biopsy   DIAGNOSIS:  A. BREAST, LEFT, 11:00, 8 CM FROM NIPPLE, ULTRASOUND-GUIDED BIOPSY:   - INVASIVE MAMMARY CARCINOMA, NOS DUCTAL.   Size of invasive carcinoma: 10 mm in this sample  Histologic grade of invasive carcinoma: Grade 3                       Glandular/tubular differentiation score: 3                       Nuclear pleomorphism score: 3                       Mitotic rate score: 2                       Total score: 8  Ductal carcinoma in situ: Not identified  Lymphovascular invasion: Not identified   Estrogen Receptor (ER) Status: NEGATIVE (LESS THAN 1%)   Progesterone Receptor (PgR) Status: NEGATIVE (LESS THAN 1%)   HER2 (by immunohistochemistry): POSITIVE (Score 3+)       Percentage of cells with uniform intense complete membrane  staining: 80%  Ki-67: Not performed      RISK FACTORS:  Menarche was at age 19-15.  First live birth at age 34. .  Ovaries intact: yes Hysterectomy: yes.  Menopausal status: postmenopausal.  HRT use: 0 years. Colonoscopy: yes; normal.  Past Medical History:  Diagnosis  Date   Allergy    Anxiety    Breast cancer ([place]) 05/27/2022   IBS (irritable bowel syndrome)    Trigger finger    Left Middle Finger 11/2016    Past Surgical History:  Procedure Laterality Date   ABDOMINAL HYSTERECTOMY  02/2016   Only cervix remains (done for prolapse)   AUGMENTATION MAMMAPLASTY Bilateral 2010   BREAST BIOPSY Left 05/17/2022   [place] BX, ribbon clip, path pending   BREAST BIOPSY Left 05/17/2022   [place] LT BREAST BX W LOC DEV 1ST LESION IMG BX SPEC US GUIDE 05/17/2022 [facility]   BREAST CYST ASPIRATION Left 2010   neg   BREAST ENHANCEMENT SURGERY  1998   CHOLECYSTECTOMY     COLONOSCOPY WITH PROPOFOL N/A 10/10/2019   Procedure: COLONOSCOPY WITH PROPOFOL;  Surgeon: Jonathon Bellows, MD;  Location: [facility] ENDOSCOPY;  Service: Gastroenterology;  Laterality: N/A;   TYMPANOPLASTY  Left     FAMILY HISTORY:  We obtained a detailed, 4-generation family history.  Significant diagnoses are listed below: Family History  Problem Relation Age of Onset   Lung cancer Mother 59   Hypertension Mother    Lupus Father    Leukemia Cousin 6   Breast cancer Neg Hx    Ms. Kanaan has 1 son, no cancers. Ms. Lapietra mother had lung cancer and died at 80. A paternal cousin had leukemia and died at age 78. No other known cancers in the family.  Ms. Derstine is unaware of previous family history of genetic testing for hereditary cancer risks. There is no reported Ashkenazi Jewish ancestry. There is no known consanguinity.    GENETIC COUNSELING ASSESSMENT: Ms. Lanzi is a 62 y.o. female with a personal history of breast cancer which is somewhat suggestive of a hereditary cancer syndrome and predisposition to cancer. We, therefore, discussed and recommended the following at today's visit.   DISCUSSION: We discussed that approximately 10% of breast cancer is hereditary. Most cases of hereditary breast cancer are associated with BRCA1/BRCA2 genes, although there are other genes associated with hereditary cancer as well. Cancers and risks are gene specific. We discussed that testing is beneficial for several reasons including knowing about cancer risks, identifying potential screening and risk-reduction options that may be appropriate, and to understand if other family members could be at risk for cancer and allow them to undergo genetic testing.   We reviewed the characteristics, features and inheritance patterns of hereditary cancer syndromes. We also discussed genetic testing, including the appropriate family members to test, the process of testing, insurance coverage and turn-around-time for results. We discussed the implications of a negative, positive and/or variant of uncertain significant result. We recommended Ms. Hockman pursue genetic testing for the [facility] Multi-Cancer+RNA gene panel.    Based on Ms. Rubis's personal history of cancer, she meets medical criteria for genetic testing. Despite that she meets criteria, she may still have an out of pocket cost. We discussed that if her out of pocket cost for testing is over $100, the laboratory will call and confirm whether she wants to proceed with testing.  If the out of pocket cost of testing is less than $100 she will be billed by the genetic testing laboratory.   PLAN: After considering the risks, benefits, and limitations, Ms. Ditter provided informed consent to pursue genetic testing and the blood sample was sent to [facility] for analysis of the Multi-Cancer+RNA panel. Results should be available within approximately 2-3 weeks' time, at which point they will be disclosed by telephone  to Ms. Demario, as will any additional recommendations warranted by these results. Ms. Piercefield will receive a summary of her genetic counseling visit and a copy of her results once available. This information will also be available in Epic.   Ms. Lormand questions were answered to her satisfaction today. Our contact information was provided should additional questions or concerns arise. Thank you for the referral and allowing [place] to share in the care of your patient.   Faith Rogue, MS, [facility] Genetic Counselor [email or web address] Phone: [phone]  The patient was seen for a total of 18 minutes in face-to-face genetic counseling.  Dr. Grayland Ormond was available for discussion regarding this case.   _______________________________________________________________________ For Office Staff:  Number of people involved in session: 1 Was an Intern/ student involved with case: no

## 2022-06-03 ENCOUNTER — Other Ambulatory Visit: Payer: Self-pay

## 2022-06-03 ENCOUNTER — Encounter
Admission: RE | Admit: 2022-06-03 | Discharge: 2022-06-03 | Disposition: A | Discharge status: Home / Self Care | Payer: 59 | Source: Ambulatory Visit | Attending: Surgery | Admitting: Surgery

## 2022-06-03 HISTORY — DX: Unspecified osteoarthritis, unspecified site: M19.90

## 2022-06-03 NOTE — Patient Instructions (Addendum)
Your procedure is scheduled on:06/09/22 - Thursday Report to the Registration Desk on the 1st floor of the [facility]. To find out your arrival time, please call [phone] between 1PM - 3PM on: 06/08/22 - Wednesday If your arrival time is 6:00 am, do not arrive before that time as the [facility] entrance doors do not open until 6:00 am. Come by the [place] to pick up your soap - [place], [address], when you enter the building , we are the 1st door to the right.  REMEMBER: Instructions that are not followed completely may result in serious medical risk, up to and including death; or upon the discretion of your surgeon and anesthesiologist your surgery may need to be rescheduled.  Do not eat food after midnight the night before surgery.  No gum chewing or hard candies.  You may however, drink CLEAR liquids up to 2 hours before you are scheduled to arrive for your surgery. Do not drink anything within 2 hours of your scheduled arrival time.  Clear liquids include: - water  - apple juice without pulp - gatorade (not RED colors) - black coffee or tea (Do NOT add milk or creamers to the coffee or tea) Do NOT drink anything that is not on this list.  One week prior to surgery: Stop Anti-inflammatories (NSAIDS) such as Advil, Aleve, Ibuprofen, Motrin, Naproxen, Naprosyn and Aspirin based products such as Excedrin, Goody's Powder, BC Powder.  Stop beginning 06/03/22, ANY OVER THE COUNTER supplements until after surgery.  You may however, continue to take Tylenol if needed for pain up until the day of surgery.  TAKE ONLY THESE MEDICATIONS THE MORNING OF SURGERY WITH A SIP OF WATER:  buPROPion (WELLBUTRIN SR)  busPIRone (BUSPAR) if needed  No Alcohol for 24 hours before or after surgery.  No Smoking including e-cigarettes for 24 hours before surgery.  No chewable tobacco products for at least 6 hours before surgery.  No nicotine patches  on the day of surgery.  Do not use any "recreational" drugs for at least a week (preferably 2 weeks) before your surgery.  Please be advised that the combination of cocaine and anesthesia may have negative outcomes, up to and including death. If you test positive for cocaine, your surgery will be cancelled.  On the morning of surgery brush your teeth with toothpaste and water, you may rinse your mouth with mouthwash if you wish. Do not swallow any toothpaste or mouthwash.  Use CHG Soap or wipes as directed on instruction sheet.  Do not wear jewelry, make-up, hairpins, clips or nail polish.  Do not wear lotions, powders, or perfumes.   Do not shave body hair from the neck down 48 hours before surgery.  Contact lenses, hearing aids and dentures may not be worn into surgery.  Do not bring valuables to the hospital. [facility] is not responsible for any missing/lost belongings or valuables.   Notify your doctor if there is any change in your medical condition (cold, fever, infection).  Wear comfortable clothing (specific to your surgery type) to the hospital.  After surgery, you can help prevent lung complications by doing breathing exercises.  Take deep breaths and cough every 1-2 hours. Your doctor may order a device called an Incentive Spirometer to help you take deep breaths. When coughing or sneezing, hold a pillow firmly against your incision with both hands. This is called "splinting." Doing this helps protect your incision. It also decreases belly  discomfort.  If you are being admitted to the hospital overnight, leave your suitcase in the car. After surgery it may be brought to your room.  In case of increased patient census, it may be necessary for you, the patient, to continue your postoperative care in the Same Day Surgery department.  If you are being discharged the day of surgery, you will not be allowed to drive home. You will need a responsible individual to drive you  home and stay with you for 24 hours after surgery.   If you are taking public transportation, you will need to have a responsible individual with you.  Please call the [place] Dept. at [phone] if you have any questions about these instructions.  Surgery Visitation Policy:  Patients undergoing a surgery or procedure may have two family members or support persons with them as long as the person is not COVID-19 positive or experiencing its symptoms.   Inpatient Visitation:    Visiting hours are 7 a.m. to 8 p.m. Up to four visitors are allowed at one time in a patient room. The visitors may rotate out with other people during the day. One designated support person (adult) may remain overnight.  Due to an increase in RSV and influenza rates and associated hospitalizations, children ages 26 and under will not be able to visit patients in [facility]. Masks continue to be strongly recommended.

## 2022-06-08 MED ORDER — CEFAZOLIN SODIUM-DEXTROSE 2-4 GM/100ML-% IV SOLN
2.0000 g | INTRAVENOUS | Status: AC
  Administered 2022-06-09: 2 g via INTRAVENOUS

## 2022-06-08 MED ORDER — FAMOTIDINE 20 MG PO TABS: 20.0000 mg | ORAL_TABLET | Freq: Once | ORAL | Status: AC

## 2022-06-08 MED ORDER — CHLORHEXIDINE GLUCONATE CLOTH 2 % EX PADS: 6.0000 | MEDICATED_PAD | Freq: Once | CUTANEOUS | Status: DC

## 2022-06-08 MED ORDER — LACTATED RINGERS IV SOLN: INTRAVENOUS | Status: DC

## 2022-06-08 MED ORDER — CHLORHEXIDINE GLUCONATE 0.12 % MT SOLN: 15.0000 mL | Freq: Once | OROMUCOSAL | Status: AC

## 2022-06-08 MED ORDER — ORAL CARE MOUTH RINSE: 15.0000 mL | Freq: Once | OROMUCOSAL | Status: AC

## 2022-06-09 ENCOUNTER — Ambulatory Visit: Payer: 59 | Admitting: General Practice

## 2022-06-09 ENCOUNTER — Encounter: Payer: Self-pay | Admitting: Surgery

## 2022-06-09 ENCOUNTER — Ambulatory Visit: Payer: 59

## 2022-06-09 ENCOUNTER — Ambulatory Visit
Admission: RE | Admit: 2022-06-09 | Discharge: 2022-06-09 | Disposition: A | Discharge status: Home / Self Care | Payer: 59 | Source: Ambulatory Visit | Attending: Surgery | Admitting: Surgery

## 2022-06-09 ENCOUNTER — Other Ambulatory Visit: Payer: Self-pay

## 2022-06-09 ENCOUNTER — Encounter
Admission: RE | Disposition: A | Discharge status: Home / Self Care | Payer: Self-pay | Source: Ambulatory Visit | Attending: Surgery

## 2022-06-09 DIAGNOSIS — C50912 Malignant neoplasm of unspecified site of left female breast: Secondary | ICD-10-CM

## 2022-06-09 DIAGNOSIS — Z171 Estrogen receptor negative status [ER-]: Secondary | ICD-10-CM | POA: Diagnosis not present

## 2022-06-09 DIAGNOSIS — C50212 Malignant neoplasm of upper-inner quadrant of left female breast: Secondary | ICD-10-CM | POA: Diagnosis present

## 2022-06-09 DIAGNOSIS — Z87891 Personal history of nicotine dependence: Secondary | ICD-10-CM | POA: Diagnosis not present

## 2022-06-09 DIAGNOSIS — L237 Allergic contact dermatitis due to plants, except food: Secondary | ICD-10-CM

## 2022-06-09 HISTORY — PX: PARTIAL MASTECTOMY WITH AXILLARY SENTINEL LYMPH NODE BIOPSY: SHX6004

## 2022-06-09 SURGERY — PARTIAL MASTECTOMY WITH AXILLARY SENTINEL LYMPH NODE BIOPSY
Anesthesia: General | Site: Breast | Laterality: Left

## 2022-06-09 MED ORDER — PROPOFOL 10 MG/ML IV BOLUS
INTRAVENOUS | Status: DC | PRN
  Administered 2022-06-09: 200 mg via INTRAVENOUS

## 2022-06-09 MED ORDER — ONDANSETRON HCL 4 MG/2ML IJ SOLN
INTRAMUSCULAR | Status: AC
  Filled 2022-06-09: qty 2

## 2022-06-09 MED ORDER — HYDROCODONE-ACETAMINOPHEN 5-325 MG PO TABS: 1.0000 | ORAL_TABLET | Freq: Four times a day (QID) | ORAL | 0 refills | Status: DC | PRN

## 2022-06-09 MED ORDER — TECHNETIUM TC 99M TILMANOCEPT KIT
1.0000 | PACK | Freq: Once | INTRAVENOUS | Status: AC | PRN
  Administered 2022-06-09: 1.08 via INTRADERMAL

## 2022-06-09 MED ORDER — DEXAMETHASONE SODIUM PHOSPHATE 10 MG/ML IJ SOLN
INTRAMUSCULAR | Status: AC
  Filled 2022-06-09: qty 1

## 2022-06-09 MED ORDER — LIDOCAINE HCL 1 % IJ SOLN
INTRAMUSCULAR | Status: DC | PRN
  Administered 2022-06-09: 30 mL via INTRAMUSCULAR

## 2022-06-09 MED ORDER — EPINEPHRINE PF 1 MG/ML IJ SOLN
INTRAMUSCULAR | Status: AC
  Filled 2022-06-09: qty 1

## 2022-06-09 MED ORDER — ONDANSETRON HCL 4 MG/2ML IJ SOLN
INTRAMUSCULAR | Status: DC | PRN
  Administered 2022-06-09: 4 mg via INTRAVENOUS

## 2022-06-09 MED ORDER — BUPIVACAINE HCL (PF) 0.5 % IJ SOLN
INTRAMUSCULAR | Status: AC
  Filled 2022-06-09: qty 30

## 2022-06-09 MED ORDER — FENTANYL CITRATE (PF) 100 MCG/2ML IJ SOLN
INTRAMUSCULAR | Status: DC | PRN
  Administered 2022-06-09: 50 ug via INTRAVENOUS
  Administered 2022-06-09 (×2): 25 ug via INTRAVENOUS

## 2022-06-09 MED ORDER — FENTANYL CITRATE (PF) 100 MCG/2ML IJ SOLN
25.0000 ug | INTRAMUSCULAR | Status: DC | PRN
  Administered 2022-06-09: 50 ug via INTRAVENOUS

## 2022-06-09 MED ORDER — STERILE WATER FOR IRRIGATION IR SOLN
Status: DC | PRN
  Administered 2022-06-09: 500 mL

## 2022-06-09 MED ORDER — OXYCODONE HCL 5 MG PO TABS: 5.0000 mg | ORAL_TABLET | Freq: Once | ORAL | Status: AC | PRN

## 2022-06-09 MED ORDER — DOCUSATE SODIUM 100 MG PO CAPS: 100.0000 mg | ORAL_CAPSULE | Freq: Two times a day (BID) | ORAL | 0 refills | Status: AC | PRN

## 2022-06-09 MED ORDER — OXYCODONE HCL 5 MG/5ML PO SOLN: 5.0000 mg | Freq: Once | ORAL | Status: AC | PRN

## 2022-06-09 MED ORDER — FENTANYL CITRATE (PF) 100 MCG/2ML IJ SOLN
INTRAMUSCULAR | Status: AC
  Filled 2022-06-09: qty 2

## 2022-06-09 MED ORDER — ACETAMINOPHEN 10 MG/ML IV SOLN
INTRAVENOUS | Status: DC | PRN
  Administered 2022-06-09: 1000 mg via INTRAVENOUS

## 2022-06-09 MED ORDER — ACETAMINOPHEN 325 MG PO TABS: 650.0000 mg | ORAL_TABLET | Freq: Three times a day (TID) | ORAL | 0 refills | Status: AC | PRN

## 2022-06-09 MED ORDER — LIDOCAINE HCL (CARDIAC) PF 100 MG/5ML IV SOSY
PREFILLED_SYRINGE | INTRAVENOUS | Status: DC | PRN
  Administered 2022-06-09: 50 mg via INTRAVENOUS

## 2022-06-09 MED ORDER — EPHEDRINE 5 MG/ML INJ
INTRAVENOUS | Status: AC
  Filled 2022-06-09: qty 5

## 2022-06-09 MED ORDER — MIDAZOLAM HCL 2 MG/2ML IJ SOLN
INTRAMUSCULAR | Status: AC
  Filled 2022-06-09: qty 2

## 2022-06-09 MED ORDER — FAMOTIDINE 20 MG PO TABS
ORAL_TABLET | ORAL | Status: AC
  Administered 2022-06-09: 20 mg via ORAL
  Filled 2022-06-09: qty 1

## 2022-06-09 MED ORDER — KETOROLAC TROMETHAMINE 30 MG/ML IJ SOLN
INTRAMUSCULAR | Status: DC | PRN
  Administered 2022-06-09: 15 mg via INTRAVENOUS

## 2022-06-09 MED ORDER — OXYCODONE HCL 5 MG PO TABS
ORAL_TABLET | ORAL | Status: AC
  Administered 2022-06-09: 5 mg via ORAL
  Filled 2022-06-09: qty 1

## 2022-06-09 MED ORDER — ACETAMINOPHEN 10 MG/ML IV SOLN
INTRAVENOUS | Status: AC
  Filled 2022-06-09: qty 100

## 2022-06-09 MED ORDER — DEXAMETHASONE SODIUM PHOSPHATE 10 MG/ML IJ SOLN
INTRAMUSCULAR | Status: DC | PRN
  Administered 2022-06-09: 10 mg via INTRAVENOUS

## 2022-06-09 MED ORDER — MIDAZOLAM HCL 2 MG/2ML IJ SOLN
INTRAMUSCULAR | Status: DC | PRN
  Administered 2022-06-09: 2 mg via INTRAVENOUS

## 2022-06-09 MED ORDER — TRIAMCINOLONE ACETONIDE 0.1 % EX CREA: 1.0000 | TOPICAL_CREAM | CUTANEOUS | Status: DC | PRN

## 2022-06-09 MED ORDER — CEFAZOLIN SODIUM-DEXTROSE 2-4 GM/100ML-% IV SOLN
INTRAVENOUS | Status: AC
  Filled 2022-06-09: qty 100

## 2022-06-09 MED ORDER — METRONIDAZOLE 0.75 % EX CREA: TOPICAL_CREAM | CUTANEOUS | Status: AC | PRN

## 2022-06-09 MED ORDER — LIDOCAINE HCL (PF) 2 % IJ SOLN
INTRAMUSCULAR | Status: AC
  Filled 2022-06-09: qty 5

## 2022-06-09 MED ORDER — EPHEDRINE SULFATE (PRESSORS) 50 MG/ML IJ SOLN
INTRAMUSCULAR | Status: DC | PRN
  Administered 2022-06-09: 15 mg via INTRAVENOUS
  Administered 2022-06-09 (×3): 10 mg via INTRAVENOUS

## 2022-06-09 MED ORDER — KETOROLAC TROMETHAMINE 30 MG/ML IJ SOLN
INTRAMUSCULAR | Status: AC
  Filled 2022-06-09: qty 1

## 2022-06-09 MED ORDER — CHLORHEXIDINE GLUCONATE 0.12 % MT SOLN
OROMUCOSAL | Status: AC
  Administered 2022-06-09: 15 mL via OROMUCOSAL
  Filled 2022-06-09: qty 15

## 2022-06-09 MED ORDER — LIDOCAINE HCL (PF) 1 % IJ SOLN
INTRAMUSCULAR | Status: AC
  Filled 2022-06-09: qty 30

## 2022-06-09 MED ORDER — PROPOFOL 10 MG/ML IV BOLUS
INTRAVENOUS | Status: AC
  Filled 2022-06-09: qty 20

## 2022-06-09 SURGICAL SUPPLY — 47 items
ADH SKN CLS APL DERMABOND .7 (GAUZE/BANDAGES/DRESSINGS) ×1
APL PRP STRL LF DISP 70% ISPRP (MISCELLANEOUS) ×1
APPLIER CLIP 11 MED OPEN (CLIP)
APR CLP MED 11 20 MLT OPN (CLIP)
BLADE PHOTON ILLUMINATED (MISCELLANEOUS) ×1 IMPLANT
BLADE SURG 15 STRL LF DISP TIS (BLADE) ×1 IMPLANT
BLADE SURG 15 STRL SS (BLADE) ×1
CHLORAPREP W/TINT 26 (MISCELLANEOUS) ×1 IMPLANT
CLIP APPLIE 11 MED OPEN (CLIP) IMPLANT
CNTNR URN SCR LID CUP LEK RST (MISCELLANEOUS) IMPLANT
CONT SPEC 4OZ STRL OR WHT (MISCELLANEOUS)
COVER PROBE GAMMA FINDER SLV (MISCELLANEOUS) IMPLANT
DERMABOND ADVANCED .7 DNX12 (GAUZE/BANDAGES/DRESSINGS) ×1 IMPLANT
DEVICE DUBIN SPECIMEN MAMMOGRA (MISCELLANEOUS) ×1 IMPLANT
DRAPE LAPAROTOMY 77X122 PED (DRAPES) ×1 IMPLANT
ELECT CAUTERY BLADE TIP 2.5 (TIP) ×1
ELECT REM PT RETURN 9FT ADLT (ELECTROSURGICAL) ×1
ELECTRODE CAUTERY BLDE TIP 2.5 (TIP) ×1 IMPLANT
ELECTRODE REM PT RTRN 9FT ADLT (ELECTROSURGICAL) ×1 IMPLANT
GAUZE 4X4 16PLY ~~LOC~~+RFID DBL (SPONGE) ×1 IMPLANT
GLOVE BIOGEL PI IND STRL 7.0 (GLOVE) ×1 IMPLANT
GLOVE SURG SYN 6.5 ES PF (GLOVE) ×2 IMPLANT
GLOVE SURG SYN 6.5 PF PI (GLOVE) ×2 IMPLANT
GOWN STRL REUS W/ TWL LRG LVL3 (GOWN DISPOSABLE) ×3 IMPLANT
GOWN STRL REUS W/TWL LRG LVL3 (GOWN DISPOSABLE) ×3
KIT MARKER MARGIN INK (KITS) ×1 IMPLANT
KIT TURNOVER KIT A (KITS) ×1 IMPLANT
LABEL OR SOLS (LABEL) ×1 IMPLANT
LIGHT WAVEGUIDE WIDE FLAT (MISCELLANEOUS) IMPLANT
MANIFOLD NEPTUNE II (INSTRUMENTS) ×1 IMPLANT
MARKER MARGIN CORRECT CLIP (MARKER) ×1 IMPLANT
NEEDLE HYPO 22GX1.5 SAFETY (NEEDLE) ×2 IMPLANT
PACK BASIN MINOR ARMC (MISCELLANEOUS) ×1 IMPLANT
SET LOCALIZER 20 PROBE US (MISCELLANEOUS) ×1 IMPLANT
SUT MNCRL 4-0 (SUTURE) ×2
SUT MNCRL 4-0 27XMFL (SUTURE) ×2
SUT SILK 3 0 12 30 (SUTURE) IMPLANT
SUT VIC AB 3-0 SH 27 (SUTURE) ×2
SUT VIC AB 3-0 SH 27X BRD (SUTURE) ×2 IMPLANT
SUTURE MNCRL 4-0 27XMF (SUTURE) ×2 IMPLANT
SYR 20ML LL LF (SYRINGE) ×1 IMPLANT
SYR BULB IRRIG 60ML STRL (SYRINGE) ×1 IMPLANT
TRAP FLUID SMOKE EVACUATOR (MISCELLANEOUS) ×1 IMPLANT
TRAP NEPTUNE SPECIMEN COLLECT (MISCELLANEOUS) ×1 IMPLANT
TUBING CONNECTING 10 (TUBING) ×1 IMPLANT
WATER STERILE IRR 1000ML POUR (IV SOLUTION) ×1 IMPLANT
WATER STERILE IRR 500ML POUR (IV SOLUTION) ×1 IMPLANT

## 2022-06-09 NOTE — Anesthesia Procedure Notes (Signed)
Procedure Name: LMA Insertion Date/Time: 06/09/2022 10:00 AM  Performed by: Levin Erp, CRNAPre-anesthesia Checklist: Patient identified, Emergency Drugs available, Suction available, Patient being monitored and Timeout performed Patient Re-evaluated:Patient Re-evaluated prior to induction Oxygen Delivery Method: Circle system utilized Preoxygenation: Pre-oxygenation with 100% oxygen Induction Type: IV induction LMA: LMA inserted LMA Size: 3.0 Tube type: Oral Number of attempts: 2 (LMA 4 attempted to large to pass easily thru mouth switched to LMA 3 easily atraumatically) Placement Confirmation: positive ETCO2 and breath sounds checked- equal and bilateral Tube secured with: Tape Dental Injury: Teeth and Oropharynx as per pre-operative assessment

## 2022-06-09 NOTE — Interval H&P Note (Signed)
History and Physical Interval Note:  06/09/2022 9:30 AM  Jennifer Garrison  has presented today for surgery, with the diagnosis of malignant neoplasm of upper-inner quadrant of left breast in female, estrogen receptor negative C50.212, Z17.1.  The various methods of treatment have been discussed with the patient and family. After consideration of risks, benefits and other options for treatment, the patient has consented to  Procedure(s): PARTIAL MASTECTOMY WITH AXILLARY SENTINEL LYMPH NODE BIOPSY (Left) as a surgical intervention.  The patient's history has been reviewed, patient examined, no change in status, stable for surgery.  I have reviewed the patient's chart and labs.  Questions were answered to the patient's satisfaction.     [name] [name]

## 2022-06-09 NOTE — Discharge Instructions (Signed)
Removal, Care After This sheet gives you information about how to care for yourself after your procedure. Your health care provider may also give you more specific instructions. If you have problems or questions, contact your health care provider. What can I expect after the procedure? After the procedure, it is common to have: Soreness. Bruising. Itching. Follow these instructions at home: site care Follow instructions from your health care provider about how to take care of your site. Make sure you: Wash your hands with soap and water before and after you change your bandage (dressing). If soap and water are not available, use hand sanitizer. Leave stitches (sutures), skin glue, or adhesive strips in place. These skin closures may need to stay in place for 2 weeks or longer. If adhesive strip edges start to loosen and curl up, you may trim the loose edges. Do not remove adhesive strips completely unless your health care provider tells you to do that. If the area bleeds or bruises, apply gentle pressure for 10 minutes. OK TO SHOWER IN 24HRS  Check your site every day for signs of infection. Check for: Redness, swelling, or pain. Fluid or blood. Warmth. Pus or a bad smell.  General instructions Rest and then return to your normal activities as told by your health care provider.  tylenol and advil as needed for discomfort.  Please alternate between the two every four hours as needed for pain.    Use narcotics, if prescribed, only when tylenol and motrin is not enough to control pain.  325-650mg every 8hrs to max of 3000mg/24hrs (including the 325mg in every norco dose) for the tylenol.    Advil up to 800mg per dose every 8hrs as needed for pain.   Keep all follow-up visits as told by your health care provider. This is important. Contact a health care provider if: You have redness, swelling, or pain around your site. You have fluid or blood coming from your site. Your site feels warm to  the touch. You have pus or a bad smell coming from your site. You have a fever. Your sutures, skin glue, or adhesive strips loosen or come off sooner than expected. Get help right away if: You have bleeding that does not stop with pressure or a dressing. Summary After the procedure, it is common to have some soreness, bruising, and itching at the site. Follow instructions from your health care provider about how to take care of your site. Check your site every day for signs of infection. Contact a health care provider if you have redness, swelling, or pain around your site, or your site feels warm to the touch. Keep all follow-up visits as told by your health care provider. This is important. This information is not intended to replace advice given to you by your health care provider. Make sure you discuss any questions you have with your health care provider. Document Released: 04/10/2015 Document Revised: 09/11/2017 Document Reviewed: 09/11/2017 Elsevier Interactive Patient Education  2019 Elsevier Inc.   

## 2022-06-09 NOTE — Anesthesia Preprocedure Evaluation (Signed)
Anesthesia Evaluation  Patient identified by MRN, date of birth, ID band Patient awake    Reviewed: Allergy & Precautions, NPO status , Patient's Chart, lab work & pertinent test results  Airway Mallampati: II  TM Distance: >3 FB Neck ROM: full    Dental  (+) Dental Advidsory Given, Chipped   Pulmonary neg pulmonary ROS, neg shortness of breath, neg COPD, former smoker   Pulmonary exam normal        Cardiovascular (-) Past MI and (-) CABG negative cardio ROS Normal cardiovascular exam     Neuro/Psych  PSYCHIATRIC DISORDERS      negative neurological ROS     GI/Hepatic negative GI ROS, Neg liver ROS,,,  Endo/Other  negative endocrine ROS    Renal/GU      Musculoskeletal   Abdominal   Peds  Hematology negative hematology ROS (+)   Anesthesia Other Findings Past Medical History: No date: Allergy No date: Anxiety No date: Arthritis 05/27/2022: Breast cancer ([place]) No date: IBS (irritable bowel syndrome) No date: Trigger finger     Comment:  Left Middle Finger 11/2016  Past Surgical History: 02/2016: ABDOMINAL HYSTERECTOMY     Comment:  Only cervix remains (done for prolapse) 2010: [place]; Bilateral 05/17/2022: BREAST BIOPSY; Left     Comment:  [place] BX, ribbon clip, path pending 05/17/2022: BREAST BIOPSY; Left     Comment:  [place] LT BREAST BX W LOC DEV 1ST LESION IMG BX [place]               GUIDE 05/17/2022 [facility]MOGRAP[facility] 2010: BREAST CYST ASPIRATION; Left     Comment:  neg 1998: BREAST ENHANCEMENT SURGERY No date: CHOLECYSTECTOMY 10/10/2019: COLONOSCOPY WITH PROPOFOL; N/A     Comment:  Procedure: COLONOSCOPY WITH PROPOFOL;  Surgeon: Jonathon Bellows, MD;  Location: [facility] ENDOSCOPY;  Service:               Gastroenterology;  Laterality: N/A; No date: TYMPANOPLASTY; Left  BMI    Body Mass Index: 18.30 kg/m      Reproductive/Obstetrics negative OB ROS                              Anesthesia Physical Anesthesia Plan  ASA: 2  Anesthesia Plan: General ETT   Post-op Pain Management:    Induction: Intravenous  PONV Risk Score and Plan: 3 and Ondansetron, Dexamethasone and Midazolam  Airway Management Planned: Oral ETT  Additional Equipment:   Intra-op Plan:   Post-operative Plan: Extubation in OR  Informed Consent: I have reviewed the patients History and Physical, chart, labs and discussed the procedure including the risks, benefits and alternatives for the proposed anesthesia with the patient or authorized representative who has indicated his/her understanding and acceptance.     Dental Advisory Given  Plan Discussed with: Anesthesiologist, CRNA and Surgeon  Anesthesia Plan Comments: (Patient consented for risks of anesthesia including but not limited to:  - adverse reactions to medications - damage to eyes, teeth, lips or other oral mucosa - nerve damage due to positioning  - sore throat or hoarseness - Damage to heart, brain, nerves, lungs, other parts of body or loss of life  Patient voiced understanding.)       Anesthesia Quick Evaluation

## 2022-06-09 NOTE — Anesthesia Postprocedure Evaluation (Signed)
Anesthesia Post Note  Patient: Retail banker  Procedure(s) Performed: PARTIAL MASTECTOMY WITH AXILLARY SENTINEL LYMPH NODE BIOPSY (Left: Breast)  Patient location during evaluation: PACU Anesthesia Type: General Level of consciousness: awake and alert Pain management: pain level controlled Vital Signs Assessment: post-procedure vital signs reviewed and stable Respiratory status: spontaneous breathing, nonlabored ventilation, respiratory function stable and patient connected to nasal cannula oxygen Cardiovascular status: blood pressure returned to baseline and stable Postop Assessment: no apparent nausea or vomiting Anesthetic complications: no  No notable events documented.   Last Vitals:  Vitals:   06/09/22 1215 06/09/22 1220  BP:  (!) 122/57  Pulse: 80 (!) 58  Resp: 18 11  Temp:    SpO2: 98% 98%    Last Pain:  Vitals:   06/09/22 1220  PainSc: 0-No pain                 Dimas Millin

## 2022-06-09 NOTE — Transfer of Care (Signed)
Immediate Anesthesia Transfer of Care Note  Patient: Retail banker  Procedure(s) Performed: PARTIAL MASTECTOMY WITH AXILLARY SENTINEL LYMPH NODE BIOPSY (Left: Breast)  Patient Location: PACU  Anesthesia Type:General  Level of Consciousness: sedated and responds to stimulation  Airway & Oxygen Therapy: Patient Spontanous Breathing and Patient connected to face mask oxygen  Post-op Assessment: Report given to RN and Post -op Vital signs reviewed and stable  Post vital signs: Reviewed and stable  Last Vitals:  Vitals Value Taken Time  BP 128/62 06/09/22 1127  Temp    Pulse 70 06/09/22 1130  Resp 13 06/09/22 1130  SpO2 100 % 06/09/22 1130  Vitals shown include unvalidated device data.  Last Pain: There were no vitals filed for this visit.       Complications: No notable events documented.

## 2022-06-09 NOTE — Op Note (Signed)
Preoperative diagnosis: Left breast carcinoma.  Postoperative diagnosis: Same.   Procedure: RF tag-localized left breast partial mastectomy.                      Left axillary Sentinel Lymph node biopsy x3  Anesthesia: GETA  Surgeon: Dr. Benjamine Sprague  Wound Classification: Clean  Indications: Patient is a 62 y.o. female with a nonpalpable left breast mass noted on mammography with core biopsy demonstrating cancer requires RF localizer placement, partial mastectomy for treatment with sentinel lymph node biopsy.   Specimen: Left breast mass, Sentinel Lymph nodes x 3, posterior margin  Complications: None  Estimated Blood Loss: 10 mL  Findings: 1. Specimen mammography shows marker and RF localizer on specimen 2. Pathology call refers gross examination of margins was initially positive on posterior aspect, additional posterior margin was negative 3. No other palpable mass or lymph node identified.   Operation performed with curative intent:Yes  Tracer(s) used to identify sentinel nodes in the upfront surgery (non-neoadjuvant) setting (select all that apply):Radioactive Tracer  Tracer(s) used to identify sentinel nodes in the neoadjuvant setting (select all that apply):N/A  All nodes (colored or non-colored) present at the end of a dye-filled lymphatic channel were removed:N/A  All significantly radioactive nodes were removed:Yes  All palpable suspicious nodes were removed:N/A  Biopsy-proven positive nodes marked with clips prior to chemotherapy were identified and removed:N/A    Description of procedure: RF localization was performed by radiology prior to procedure. In the nuclear medicine suite, the subareolar region was injected with Tc-99 sulfur colloid the morning of procedure. Localization studies were reviewed. The patient was taken to the operating room and placed supine on the operating table, and after general anesthesia the left breast and axilla were prepped and draped  in the usual sterile fashion. A time-out was completed verifying correct patient, procedure, site, positioning, and implant(s) and/or special equipment prior to beginning this procedure.  By identifying the RF localizer, the probable trajectory and location of the mass was visualized. A skin incision was planned in such a way as to minimize the amount of dissection to reach the mass.  The skin incision was made after infusion of local. Flaps were raised and  Sharp and blunt dissection was then taken down to the mass, taking care to include the entire RF localizer and a margin of grossly normal tissue. The specimen was removed. The specimen was oriented with paint. Imaging reviewed and the entire target lesion had been resected, with biopsy clip and localizer within the specimen.Gross margin analysis by pathology confirmed posterior margin positive.  Excision of the posterior margin performed, marked, and sent again for gross margin analysis.  All margins on the posterior margin specimen cleared on inspection. A hand-held gamma probe was used to identify the location of the hottest spot in the axilla. An incision was made around the caudal axillary hairline. Sharp and blunt Dissection was carried down to subdermal facias. The probe was placed within wound and again, the point of maximal count was found. Dissection continue until nodule was identified. The probe was placed in contact with the node and 3500 counts were recorded. The node was excised in its entirety. Ex vivo, the node measured 38 counts when placed on the probe. The bed of the node measured 100 counts.  2 additional hot spots detected and both excised in similar fashion. No additional hot spots were identified. No clinically abnormal nodes were palpated. Both wounds irrigated, hemostasis was achieved and the  wound closed in layers with  interrupted sutures of 3-0 Vicryl in deep dermal layer and a running subcuticular suture of Monocryl 4-0, then  dressed with dermabond. The patient tolerated the procedure well and was taken to the postanesthesia care unit in stable condition. Sponge and instrument count correct at end of procedure.

## 2022-06-10 ENCOUNTER — Other Ambulatory Visit: Payer: Self-pay | Admitting: Pathology

## 2022-06-10 ENCOUNTER — Encounter: Payer: Self-pay | Admitting: Surgery

## 2022-06-10 LAB — SURGICAL PATHOLOGY

## 2022-06-13 ENCOUNTER — Encounter: Payer: Self-pay | Admitting: Licensed Clinical Social Worker

## 2022-06-13 ENCOUNTER — Encounter: Payer: Self-pay | Admitting: *Deleted

## 2022-06-13 ENCOUNTER — Ambulatory Visit: Payer: Self-pay | Admitting: Licensed Clinical Social Worker

## 2022-06-13 ENCOUNTER — Telehealth: Payer: Self-pay | Admitting: Licensed Clinical Social Worker

## 2022-06-13 DIAGNOSIS — Z1379 Encounter for other screening for genetic and chromosomal anomalies: Secondary | ICD-10-CM

## 2022-06-13 NOTE — Progress Notes (Signed)
Follow up appt. Moved sooner per Dr. Darrall Dears request. Ms. Jennifer Garrison will come in this Friday to discuss adjuvant chemotherapy.  Ms. Jennifer Garrison made aware of new appt. Details.

## 2022-06-13 NOTE — Telephone Encounter (Signed)
I contacted Ms. Metter to discuss her genetic testing results. No pathogenic variants were identified in the 70 genes analyzed. Detailed clinic note to follow.   The test report has been scanned into EPIC and is located under the Molecular Pathology section of the Results Review tab.  A portion of the result report is included below for reference.      Faith Rogue, MS, [facility] Genetic Counselor [email or web address] Phone: [phone]

## 2022-06-13 NOTE — Progress Notes (Signed)
HPI:   Ms. Herdon was previously seen in the [place] clinic due to a personal and family history of cancer and concerns regarding a hereditary predisposition to cancer. Please refer to our prior cancer genetics clinic note for more information regarding our discussion, assessment and recommendations, at the time. Ms. Sessums recent genetic test results were disclosed to her, as were recommendations warranted by these results. These results and recommendations are discussed in more detail below.  CANCER HISTORY:  Oncology History  Breast cancer ([place])  05/11/2022 Mammogram   Diagnostic mammogram and ultrasound- FINDINGS: Full field and spot compression views of the LEFT breast demonstrate a 1 cm irregular mass within the UPPER INNER LEFT breast, at the site of patient concern. The patient has retropectoral implants.   On physical exam, a firm palpable mass identified at the 11 o'clock position of the LEFT breast 8 cm from the nipple.   Targeted ultrasound is performed, showing a 0.9 x 0.8 x 1 cm irregular hypoechoic mass at the 11 o'clock position of the LEFT breast 8 cm from the nipple.   No abnormal LEFT axillary lymph nodes are noted.   IMPRESSION: 1. Suspicious 1 cm UPPER INNER LEFT breast mass. Tissue sampling is recommended. 2. No abnormal appearing LEFT axillary lymph nodes.   05/17/2022 Cancer Staging   Staging form: Breast, AJCC 8th Edition - Clinical stage from 05/17/2022: Stage IA (cT1b, cN0, cM0, G3, ER-, PR-, HER2+) - Signed by Jane Canary, MD on 05/27/2022 Stage prefix: Initial diagnosis Histologic grading system: 3 grade system   05/17/2022 Initial Biopsy   DIAGNOSIS:  A. BREAST, LEFT, 11:00, 8 CM FROM NIPPLE, ULTRASOUND-GUIDED BIOPSY:   - INVASIVE MAMMARY CARCINOMA, NOS DUCTAL.   Size of invasive carcinoma: 10 mm in this sample  Histologic grade of invasive carcinoma: Grade 3                       Glandular/tubular differentiation score: 3                        Nuclear pleomorphism score: 3                       Mitotic rate score: 2                       Total score: 8  Ductal carcinoma in situ: Not identified  Lymphovascular invasion: Not identified   Estrogen Receptor (ER) Status: NEGATIVE (LESS THAN 1%)   Progesterone Receptor (PgR) Status: NEGATIVE (LESS THAN 1%)   HER2 (by immunohistochemistry): POSITIVE (Score 3+)       Percentage of cells with uniform intense complete membrane  staining: 80%  Ki-67: Not performed     Genetic Testing   No pathogenic variants identified on the [facility] Multi-Cancer+RNA panel. VUS in MUTYH called c.1484G>A identified. The report date is 06/09/2022.  The Multi-Cancer + RNA Panel offered by [facility] includes sequencing and/or deletion/duplication analysis of the following 70 genes:  AIP*, ALK, APC*, ATM*, AXIN2*, BAP1*, BARD1*, BLM*, BMPR1A*, BRCA1*, BRCA2*, BRIP1*, CDC73*, CDH1*, CDK4, CDKN1B*, CDKN2A, CHEK2*, CTNNA1*, DICER1*, EPCAM, EGFR, FH*, FLCN*, GREM1, HOXB13, KIT, LZTR1, MAX*, MBD4, MEN1*, MET, MITF, MLH1*, MSH2*, MSH3*, MSH6*, MUTYH*, NF1*, NF2*, NTHL1*, PALB2*, PDGFRA, PMS2*, POLD1*, POLE*, POT1*, PRKAR1A*, PTCH1*, PTEN*, RAD51C*, RAD51D*, RB1*, RET, SDHA*, SDHAF2*, SDHB*, SDHC*, SDHD*, SMAD4*, SMARCA4*, SMARCB1*, SMARCE1*, STK11*, SUFU*, TMEM127*, TP53*, TSC1*, TSC2*, VHL*. RNA analysis is performed  for * genes.     FAMILY HISTORY:  We obtained a detailed, 4-generation family history.  Significant diagnoses are listed below: Family History  Problem Relation Age of Onset   Lung cancer Mother 31   Hypertension Mother    Lupus Father    Leukemia Cousin 6   Breast cancer Neg Hx    Ms. Seats has 1 son, no cancers. Ms. [place] mother had lung cancer and died at 44. A paternal cousin had leukemia and died at age 3. No other known cancers in the family.   Ms. Zeno is unaware of previous family history of genetic testing for hereditary cancer risks. There is no reported Ashkenazi  Jewish ancestry. There is no known consanguinity.      GENETIC TEST RESULTS:  The [facility] Multi-Cancer+RNA Panel found no pathogenic mutations.   The Multi-Cancer + RNA Panel offered by [facility] includes sequencing and/or deletion/duplication analysis of the following 70 genes:  AIP*, ALK, APC*, ATM*, AXIN2*, BAP1*, BARD1*, BLM*, BMPR1A*, BRCA1*, BRCA2*, BRIP1*, CDC73*, CDH1*, CDK4, CDKN1B*, CDKN2A, CHEK2*, CTNNA1*, DICER1*, EPCAM, EGFR, FH*, FLCN*, GREM1, HOXB13, KIT, LZTR1, MAX*, MBD4, MEN1*, MET, MITF, MLH1*, MSH2*, MSH3*, MSH6*, MUTYH*, NF1*, NF2*, NTHL1*, PALB2*, PDGFRA, PMS2*, POLD1*, POLE*, POT1*, PRKAR1A*, PTCH1*, PTEN*, RAD51C*, RAD51D*, RB1*, RET, SDHA*, SDHAF2*, SDHB*, SDHC*, SDHD*, SMAD4*, SMARCA4*, SMARCB1*, SMARCE1*, STK11*, SUFU*, TMEM127*, TP53*, TSC1*, TSC2*, VHL*. RNA analysis is performed for * genes.   The test report has been scanned into EPIC and is located under the Molecular Pathology section of the Results Review tab.  A portion of the result report is included below for reference. Genetic testing reported out on 06/09/2022.      Genetic testing identified a variant of uncertain significance (VUS) in the MUTYH gene called c.1484G>A.  At this time, it is unknown if this variant is associated with an increased risk for cancer or if it is benign, but most uncertain variants are reclassified to benign. It should not be used to make medical management decisions. With time, we suspect the laboratory will determine the significance of this variant, if any. If the laboratory reclassifies this variant, we will attempt to contact Ms. Walther to discuss it further.   Even though a pathogenic variant was not identified, possible explanations for the cancer in the family may include: There may be no hereditary risk for cancer in the family. The cancers in Ms. Zhao and/or her family may be sporadic/familial or due to other genetic and environmental factors. There may be a gene mutation  in one of these genes that current testing methods cannot detect but that chance is small. There could be another gene that has not yet been discovered, or that we have not yet tested, that is responsible for the cancer diagnoses in the family.  It is also possible there is a hereditary cause for the cancer in the family that Ms. Peachey did not inherit.  Therefore, it is important to remain in touch with cancer genetics in the future so that we can continue to offer Ms. Suess the most up to date genetic testing.   ADDITIONAL GENETIC TESTING:  We discussed with Ms. Minarik that her genetic testing was fairly extensive.  If there are additional relevant genes identified to increase cancer risk that can be analyzed in the future, we would be happy to discuss and coordinate this testing at that time.    CANCER SCREENING RECOMMENDATIONS:  Ms. Symington test result is considered negative (normal).  This means that we have not  identified a hereditary cause for her personal and family history of cancer at this time.   An individual's cancer risk and medical management are not determined by genetic test results alone. Overall cancer risk assessment incorporates additional factors, including personal medical history, family history, and any available genetic information that may result in a personalized plan for cancer prevention and surveillance. Therefore, it is recommended she continue to follow the cancer management and screening guidelines provided by her oncology and primary healthcare provider.  RECOMMENDATIONS FOR FAMILY MEMBERS:   Since she did not inherit a identifiable mutation in a cancer predisposition gene included on this panel, her children could not have inherited a known mutation from her in one of these genes. Individuals in this family might be at some increased risk of developing cancer, over the general population risk, due to the family history of cancer.  Individuals in the family should  notify their providers of the family history of cancer. We recommend women in this family have a yearly mammogram beginning at age 21, or 41 years younger than the earliest onset of cancer, an annual clinical breast exam, and perform monthly breast self-exams.  Family members should have colonoscopies by at age 42, or earlier, as recommended by their providers. We do not recommend familial testing for the MUTYH variant of uncertain significance (VUS).  FOLLOW-UP:  Lastly, we discussed with Ms. Ting that cancer genetics is a rapidly advancing field and it is possible that new genetic tests will be appropriate for her and/or her family members in the future. We encouraged her to remain in contact with cancer genetics on an annual basis so we can update her personal and family histories and let her know of advances in cancer genetics that may benefit this family.   Our contact number was provided. Ms. Verzosa questions were answered to her satisfaction, and she knows she is welcome to call us at anytime with additional questions or concerns.    Faith Rogue, MS, [facility] Genetic Counselor [email or web address] Phone: [phone]

## 2022-06-17 ENCOUNTER — Inpatient Hospital Stay (HOSPITAL_BASED_OUTPATIENT_CLINIC_OR_DEPARTMENT_OTHER): Payer: 59 | Admitting: Internal Medicine

## 2022-06-17 ENCOUNTER — Inpatient Hospital Stay: Payer: 59

## 2022-06-17 ENCOUNTER — Encounter: Payer: Self-pay | Admitting: Internal Medicine

## 2022-06-17 VITALS — BP 122/55 | HR 70 | Temp 98.8°F | Resp 15 | Wt 114.2 lb

## 2022-06-17 DIAGNOSIS — C50912 Malignant neoplasm of unspecified site of left female breast: Secondary | ICD-10-CM

## 2022-06-17 DIAGNOSIS — Z01818 Encounter for other preprocedural examination: Secondary | ICD-10-CM

## 2022-06-17 DIAGNOSIS — Z171 Estrogen receptor negative status [ER-]: Secondary | ICD-10-CM | POA: Diagnosis not present

## 2022-06-17 DIAGNOSIS — D0512 Intraductal carcinoma in situ of left breast: Secondary | ICD-10-CM | POA: Diagnosis not present

## 2022-06-17 LAB — COMPREHENSIVE METABOLIC PANEL
ALT: 13 U/L (ref 0–44)
AST: 15 U/L (ref 15–41)
Albumin: 4.1 g/dL (ref 3.5–5.0)
Alkaline Phosphatase: 48 U/L (ref 38–126)
Anion gap: 9 (ref 5–15)
BUN: 18 mg/dL (ref 8–23)
CO2: 26 mmol/L (ref 22–32)
Calcium: 9.4 mg/dL (ref 8.9–10.3)
Chloride: 104 mmol/L (ref 98–111)
Creatinine, Ser: 0.74 mg/dL (ref 0.44–1.00)
GFR, Estimated: >60 mL/min (ref >60)
Glucose, Bld: 90 mg/dL (ref 70–99)
Potassium: 3.8 mmol/L (ref 3.5–5.1)
Sodium: 139 mmol/L (ref 135–145)
Total Bilirubin: 0.8 mg/dL (ref 0.3–1.2)
Total Protein: 7 g/dL (ref 6.5–8.1)

## 2022-06-17 LAB — CBC WITH DIFFERENTIAL/PLATELET
Abs Immature Granulocytes: 0.03 10*3/uL (ref 0.00–0.07)
Basophils Absolute: 0.1 10*3/uL (ref 0.0–0.1)
Basophils Relative: 1 %
Eosinophils Absolute: 0.3 10*3/uL (ref 0.0–0.5)
Eosinophils Relative: 4 %
HCT: 39.8 % (ref 36.0–46.0)
Hemoglobin: 13.2 g/dL (ref 12.0–15.0)
Immature Granulocytes: 0 %
Lymphocytes Relative: 25 %
Lymphs Abs: 1.9 10*3/uL (ref 0.7–4.0)
MCH: 29.4 pg (ref 26.0–34.0)
MCHC: 33.2 g/dL (ref 30.0–36.0)
MCV: 88.6 fL (ref 80.0–100.0)
Monocytes Absolute: 0.6 10*3/uL (ref 0.1–1.0)
Monocytes Relative: 8 %
Neutro Abs: 4.8 10*3/uL (ref 1.7–7.7)
Neutrophils Relative %: 62 %
Platelets: 261 10*3/uL (ref 150–400)
RBC: 4.49 MIL/uL (ref 3.87–5.11)
RDW: 12.8 % (ref 11.5–15.5)
WBC: 7.6 10*3/uL (ref 4.0–10.5)
nRBC: 0 % (ref 0.0–0.2)

## 2022-06-17 NOTE — Progress Notes (Signed)
Jennifer Garrison CONSULT NOTE  Patient Care Team: Jearld Fenton, NP as PCP - General (Internal Medicine) Daiva Huge, RN as Oncology Nurse Navigator  REFERRING PROVIDER: Webb Silversmith  REASON FOR REFFERAL: breast cancer  CANCER STAGING   Cancer Staging  Breast cancer [facility]) Staging form: Breast, AJCC 8th Edition - Clinical stage from 05/17/2022: Stage IA (cT1b, cN0, cM0, G3, ER-, PR-, HER2+) - Signed by Jane Canary, MD on 05/27/2022 Stage prefix: Initial diagnosis Histologic grading system: 3 grade system   ASSESSMENT & PLAN:  Jennifer Garrison 62 y.o. female with pmh of osteoporosis and anxiety was referred to medical oncology for management of left breast stage Ia invasive ductal cancer.  # Left breast IDC stage IA, ER/PR negative, HER2 positive -Patient felt a palpable mass in her left breast.   - S/p left breast lumpectomy with SLNB by Dr. [name] on 06/09/2022. Pathology showed 1.4 cm IDC, grade 3, DCIS present grade 2-3 with comedonecrosis, 0/3 lymph nodes negative, margins negative, pT1c.  ER/PR negative.  HER2 positive  -I discussed with the patient and the husband about the surgical pathology report and the need for further adjuvant treatment.  We had previously discussed about treatment based on the APT trial which I reiterated again today with paclitaxel 80 mg/m and IV trastuzumab loading dose of 4 mg/kg subsequent dose 2 mg/kg weekly for 12 weeks followed by trastuzumab once every 3 weeks for 40 weeks to complete a full year of trastuzumab.  Side effects such as decreased blood count, need for blood transfusion, increased risk of infection, peripheral neuropathy, diarrhea, renal dysfunction, nausea, vomiting, decreased appetite, fatigue, cardiac dysfunction was discussed.  Will obtain echocardiogram for baseline cardiac function prior to starting trastuzumab.  Discussed about the port placement.  -After adjuvant chemotherapy, will make referral to radiation  oncology.  -Evaluated by genetics-testing negative  REFERENCE - APT trial 410 patients were enrolled and 406 were given adjuvant paclitaxel and trastuzumab and included in the analysis. Mean age at enrolment was 45 years (SD 105), 405 (998%) of 406 patients were female and one (02%) was female, 350 (862%) were White, 28 (69%) were Black or African American, and 272 (670%) had hormone receptor-positive disease. After a median follow-up of 108 years (IQR 71-114), among 406 patients included in the analysis population, we observed 31 invasive disease-free survival events, of which six (194%) were locoregional ipsilateral recurrences, nine (290%) were new contralateral breast cancers, six (194%) were distant recurrences, and ten (323%) were all-cause deaths. 10-year invasive disease-free survival was 913% (95% CI 883-944), 10-year recurrence-free interval was 963% (95% CI 943-983), 10-year overall survival was 943% (95% CI 918-968), and 10-year breast cancer-specific survival was 988% (95% CI 976-100). HER2DX risk score as a continuous variable was significantly associated with invasive disease-free survival (hazard ratio [HR] per 10-unit increment 124 [95% CI 100-152]; p=0047) and recurrence-free interval (145 [109-193]; p=0011).  # Osteoporosis -On alendronate  Orders Placed This Encounter  Procedures   CBC with Differential/Platelet    Standing Status:   Future    Number of Occurrences:   1    Standing Expiration Date:   06/17/2023   Comprehensive metabolic panel    Standing Status:   Future    Number of Occurrences:   1    Standing Expiration Date:   06/17/2023   CBC with Differential ([place] Only)    Standing Status:   Future    Standing Expiration Date:   07/12/2023   CMP [facility])    Standing Status:   Future    Standing Expiration Date:   07/12/2023   CBC with Differential (Cancer Center Only)    Standing Status:   Future    Standing  Expiration Date:   07/19/2023   CMP ([place] only)    Standing Status:   Future    Standing Expiration Date:   07/19/2023   CBC with Differential (Cancer Center Only)    Standing Status:   Future    Standing Expiration Date:   07/26/2023   CMP ([place] only)    Standing Status:   Future    Standing Expiration Date:   07/26/2023   CBC with Differential (Cancer Center Only)    Standing Status:   Future    Standing Expiration Date:   08/02/2023   CMP ([place] only)    Standing Status:   Future    Standing Expiration Date:   08/02/2023   ECHOCARDIOGRAM COMPLETE    Standing Status:   Future    Standing Expiration Date:   06/17/2023    Order Specific Question:   Where should this test be performed    Answer:   [facility]    Order Specific Question:   Please indicate who you request to read the nuc med / echo results.    Answer:   [facility] Unassigned    Order Specific Question:   Perflutren DEFINITY (image enhancing agent) should be administered unless hypersensitivity or allergy exist    Answer:   Administer Perflutren    Order Specific Question:   Is a special reader required? (athlete or structural heart)    Answer:   No    Order Specific Question:   Reason for exam-Echo    Answer:   Chemo  Z09    The total time spent in the appointment was 60 minutes encounter with patients including review of chart and various tests results, discussions about plan of care and coordination of care plan   All questions were answered. The patient knows to call the clinic with any problems, questions or concerns. No barriers to learning was detected.  Jane Canary, MD 3/22/20244:16 PM   HISTORY OF PRESENTING ILLNESS:  Jennifer Garrison 62 y.o. female with pmh of osteoporosis on alendronate and anxiety was referred to medical oncology for further management of stage Ia left breast invasive ductal cancer HER2 positive  Patient felt a palpable mass in her left breast mid February 2024.  She  sought medical attention from PCP.  Further imaging as below.  INTERVAL HISTORY- Patient was seen today post surgery to discuss adjuvant treatment-  Is recovering from surgery. Wound healing well. Has bruising on the entire left breast. Improving now. Has mild soreness in armpit on raising arm. Using tylenol and ibuprofen.   I have reviewed her chart and materials related to her cancer extensively and collaborated history with the patient. Summary of oncologic history is as follows: Oncology History  Breast cancer ([place])  05/11/2022 Mammogram   Diagnostic mammogram and ultrasound- FINDINGS: Full field and spot compression views of the LEFT breast demonstrate a 1 cm irregular mass within the UPPER INNER LEFT breast, at the site of patient concern. The patient has retropectoral implants.   On physical exam, a firm palpable mass identified at the 11 o'clock position of the LEFT breast 8 cm from the nipple.   Targeted ultrasound is performed, showing a 0.9 x 0.8 x 1 cm irregular hypoechoic mass at the 11 o'clock  position of the LEFT breast 8 cm from the nipple.   No abnormal LEFT axillary lymph nodes are noted.   IMPRESSION: 1. Suspicious 1 cm UPPER INNER LEFT breast mass. Tissue sampling is recommended. 2. No abnormal appearing LEFT axillary lymph nodes.   05/17/2022 Cancer Staging   Staging form: Breast, AJCC 8th Edition - Clinical stage from 05/17/2022: Stage IA (cT1b, cN0, cM0, G3, ER-, PR-, HER2+) - Signed by Jane Canary, MD on 05/27/2022 Stage prefix: Initial diagnosis Histologic grading system: 3 grade system   05/17/2022 Initial Biopsy   DIAGNOSIS:  A. BREAST, LEFT, 11:00, 8 CM FROM NIPPLE, ULTRASOUND-GUIDED BIOPSY:   - INVASIVE MAMMARY CARCINOMA, NOS DUCTAL.   Size of invasive carcinoma: 10 mm in this sample  Histologic grade of invasive carcinoma: Grade 3                       Glandular/tubular differentiation score: 3                       Nuclear pleomorphism score: 3                        Mitotic rate score: 2                       Total score: 8  Ductal carcinoma in situ: Not identified  Lymphovascular invasion: Not identified   Estrogen Receptor (ER) Status: NEGATIVE (LESS THAN 1%)   Progesterone Receptor (PgR) Status: NEGATIVE (LESS THAN 1%)   HER2 (by immunohistochemistry): POSITIVE (Score 3+)       Percentage of cells with uniform intense complete membrane  staining: 80%  Ki-67: Not performed     Genetic Testing   No pathogenic variants identified on the [facility] Multi-Cancer+RNA panel. VUS in MUTYH called c.1484G>A identified. The report date is 06/09/2022.  The Multi-Cancer + RNA Panel offered by [facility] includes sequencing and/or deletion/duplication analysis of the following 70 genes:  AIP*, ALK, APC*, ATM*, AXIN2*, BAP1*, BARD1*, BLM*, BMPR1A*, BRCA1*, BRCA2*, BRIP1*, CDC73*, CDH1*, CDK4, CDKN1B*, CDKN2A, CHEK2*, CTNNA1*, DICER1*, EPCAM, EGFR, FH*, FLCN*, GREM1, HOXB13, KIT, LZTR1, MAX*, MBD4, MEN1*, MET, MITF, MLH1*, MSH2*, MSH3*, MSH6*, MUTYH*, NF1*, NF2*, NTHL1*, PALB2*, PDGFRA, PMS2*, POLD1*, POLE*, POT1*, PRKAR1A*, PTCH1*, PTEN*, RAD51C*, RAD51D*, RB1*, RET, SDHA*, SDHAF2*, SDHB*, SDHC*, SDHD*, SMAD4*, SMARCA4*, SMARCB1*, SMARCE1*, STK11*, SUFU*, TMEM127*, TP53*, TSC1*, TSC2*, VHL*. RNA analysis is performed for * genes.   06/09/2022 Surgery   S/p left breast lumpectomy with SLNB by Dr. [name]  Pathology showed 1.4 cm IDC, grade 3, DCIS present grade 2-3 with comedonecrosis, 0/3 lymph nodes negative, margins negative, pT1c.  ER/PR negative.  HER2 positive   07/12/2022 -  Chemotherapy   Patient is on Treatment Plan : BREAST Paclitaxel + Trastuzumab q7d / Trastuzumab q21d      Menarche age 11 or 46 Age at first birth 64 Used birth control pills and Depo more than 5 years Menopause was age 34 Hysterectomy yes for prolapsed uterus HRT no History of breast biopsies yes for breast cyst in left lower Family history of lung cancer in mother  who was smoker  MEDICAL HISTORY:  Past Medical History:  Diagnosis Date   Allergy    Anxiety    Arthritis    Breast cancer ([place]) 05/27/2022   IBS (irritable bowel syndrome)    Trigger finger    Left Middle Finger 11/2016    SURGICAL HISTORY: Past Surgical  History:  Procedure Laterality Date   ABDOMINAL HYSTERECTOMY  02/2016   Only cervix remains (done for prolapse)   AUGMENTATION MAMMAPLASTY Bilateral 2010   BREAST BIOPSY Left 05/17/2022   [place] BX, ribbon clip, path pending   BREAST BIOPSY Left 05/17/2022   [place] LT BREAST BX W LOC DEV 1ST LESION IMG BX SPEC US GUIDE 05/17/2022 [facility]   BREAST CYST ASPIRATION Left 2010   neg   BREAST ENHANCEMENT SURGERY  1998   CHOLECYSTECTOMY     COLONOSCOPY WITH PROPOFOL N/A 10/10/2019   Procedure: COLONOSCOPY WITH PROPOFOL;  Surgeon: Jonathon Bellows, MD;  Location: [facility] ENDOSCOPY;  Service: Gastroenterology;  Laterality: N/A;   PARTIAL MASTECTOMY WITH AXILLARY SENTINEL LYMPH NODE BIOPSY Left 06/09/2022   Procedure: PARTIAL MASTECTOMY WITH AXILLARY SENTINEL LYMPH NODE BIOPSY;  Surgeon: Benjamine Sprague, DO;  Location: [facility] ORS;  Service: General;  Laterality: Left;   TYMPANOPLASTY Left     SOCIAL HISTORY: Social History   Socioeconomic History   Marital status: Married    Spouse name: Shanon Brow   Number of children: 1   Years of education: Not on file   Highest education level: Not on file  Occupational History   Not on file  Tobacco Use   Smoking status: Former    Types: Cigarettes    Quit date: 09/09/1992    Years since quitting: 29.7   Smokeless tobacco: Current    Types: Chew   Tobacco comments:    Chewing tobacco   Vaping Use   Vaping Use: Former  Substance and Sexual Activity   Alcohol use: Not Currently   Drug use: Yes    Types: Marijuana    Comment: daily   Sexual activity: Yes    Birth control/protection: None  Other Topics Concern   Not on file  Social History Narrative   Not on file   Social Determinants of  Health   Financial Resource Strain: Not on file  Food Insecurity: No Food Insecurity (05/27/2022)   Hunger Vital Sign    Worried About Running Out of Food in the Last Year: Never true    Ran Out of Food in the Last Year: Never true  Transportation Needs: No Transportation Needs (05/27/2022)   PRAPARE - Hydrologist (Medical): No    Lack of Transportation (Non-Medical): No  Physical Activity: Not on file  Stress: Not on file  Social Connections: Not on file  Intimate Partner Violence: Not At Risk (05/27/2022)   Humiliation, Afraid, Rape, and Kick questionnaire    Fear of Current or Ex-Partner: No    Emotionally Abused: No    Physically Abused: No    Sexually Abused: No    FAMILY HISTORY: Family History  Problem Relation Age of Onset   Lung cancer Mother 50   Hypertension Mother    Lupus Father    Leukemia Cousin 6   Breast cancer Neg Hx     ALLERGIES:  is allergic to poison ivy extract.  MEDICATIONS:  Current Outpatient Medications  Medication Sig Dispense Refill   acetaminophen (TYLENOL) 325 MG tablet Take 2 tablets (650 mg total) by mouth every 8 (eight) hours as needed for mild pain. 40 tablet 0   ADVANCED FIBER COMPLEX PO Take 3 tablets by mouth daily. Gummies     alendronate (FOSAMAX) 70 MG tablet Take 70 mg by mouth once a week.     buPROPion (WELLBUTRIN SR) 150 MG 12 hr tablet TAKE 1 TABLET TWICE A DAY 180 tablet  0   busPIRone (BUSPAR) 5 MG tablet Take 1 tablet (5 mg total) by mouth 2 (two) times daily as needed. 60 tablet 2   calcium carbonate (TUMS EX) 750 MG chewable tablet Chew 1 tablet by mouth daily.     docusate sodium (COLACE) 100 MG capsule Take 1 capsule (100 mg total) by mouth 2 (two) times daily as needed for up to 10 days for mild constipation. 20 capsule 0   HYDROcodone-acetaminophen (NORCO) 5-325 MG tablet Take 1 tablet by mouth every 6 (six) hours as needed for up to 6 doses for moderate pain. 6 tablet 0   ibuprofen (ADVIL) 600  MG tablet Take 1 tablet (600 mg total) by mouth every 6 (six) hours as needed. 30 tablet 0   metroNIDAZOLE (METROCREAM) 0.75 % cream Apply topically as needed.     OVER THE COUNTER MEDICATION 1.5 tablets in the morning and at bedtime. Calcium Bone Strength Supplement1     OVER THE COUNTER MEDICATION 2 tablets daily. Vitex Berry     OVER THE COUNTER MEDICATION in the morning, at noon, and at bedtime. Milk Thisle Seeds Tea     triamcinolone cream (KENALOG) 0.1 % Apply 1 Application topically as needed.     No current facility-administered medications for this visit.    REVIEW OF SYSTEMS:   Pertinent information mentioned in HPI All other systems were reviewed with the patient and are negative.  PHYSICAL EXAMINATION: ECOG PERFORMANCE STATUS: 0 - Asymptomatic  Vitals:   06/17/22 1522  BP: (!) 122/55  Pulse: 70  Resp: 15  Temp: 98.8 F (37.1 C)  SpO2: 100%    Filed Weights   06/17/22 1522  Weight: 114 lb 3.2 oz (51.8 kg)     GENERAL:alert, no distress and comfortable SKIN: skin color, texture, turgor are normal, no rashes or significant lesions EYES: normal, conjunctiva are pink and non-injected, sclera clear OROPHARYNX:no exudate, no erythema and lips, buccal mucosa, and tongue normal  NECK: supple, thyroid normal size, non-tender, without nodularity LYMPH:  no palpable lymphadenopathy in the cervical, axillary or inguinal LUNGS: clear to auscultation and percussion with normal breathing effort HEART: regular rate & rhythm and no murmurs and no lower extremity edema ABDOMEN:abdomen soft, non-tender and normal bowel sounds Musculoskeletal:no cyanosis of digits and no clubbing  PSYCH: alert & oriented x 3 with fluent speech NEURO: no focal motor/sensory deficits  LABORATORY DATA:  I have reviewed the data as listed Lab Results  Component Value Date   WBC 6.6 06/28/2021   HGB 14.0 06/28/2021   HCT 43.1 06/28/2021   MCV 88.9 06/28/2021   PLT 231 06/28/2021   Recent  Labs    06/28/21 0918  NA 142  K 4.6  CL 106  CO2 27  GLUCOSE 87  BUN 16  CREATININE 0.88  CALCIUM 9.5  PROT 6.5  AST 16  ALT 11  BILITOT 0.6    RADIOGRAPHIC STUDIES: I have personally reviewed the radiological images as listed and agreed with the findings in the report. MM BREAST SURGICAL SPECIMEN  Result Date: 06/09/2022 CLINICAL DATA:  Patient is status post ultrasound-guided biopsy of a LEFT breast mass which demonstrated invasive mammary carcinoma (heart clip). Preoperative localization was not performed. EXAM: SPECIMEN RADIOGRAPH OF THE LEFT BREAST COMPARISON:  Previous exam(s). FINDINGS: Status post excision of the LEFT breast. The heart shaped biopsy marker clip is present in the specimen. Mass appears to closely approximate the margins of the specimen. IMPRESSION: Specimen radiograph of the LEFT breast. Electronically  Signed   By: Valentino Saxon M.D.   On: 06/09/2022 13:26  NM Sentinel Node Inj-No Rpt (Breast)  Result Date: 06/09/2022 Sulfur Colloid was injected by the Nuclear Medicine Technologist for sentinel lymph node localization.

## 2022-06-17 NOTE — Progress Notes (Signed)
No concerns at this time. `

## 2022-06-17 NOTE — Progress Notes (Signed)
START ON PATHWAY REGIMEN - Breast     Cycle 1: A cycle is 7 days:     Trastuzumab-xxxx      Paclitaxel    Cycles 2 through 12: A cycle is every 7 days:     Trastuzumab-xxxx      Paclitaxel    Cycles 13 through 25: A cycle is every 21 days:     Trastuzumab-xxxx   **Always confirm dose/schedule in your pharmacy ordering system**  Patient Characteristics: Postoperative without Neoadjuvant Therapy, M0 (Pathologic Staging), Invasive Disease, Adjuvant Therapy, HER2 Positive, ER Negative, Node Negative, pT1c, pN0/N46mi Therapeutic Status: Postoperative without Neoadjuvant Therapy, M0 (Pathologic Staging) AJCC Grade: G3 AJCC N Category: pN0 AJCC M Category: cM0 ER Status: Negative (-) AJCC 8 Stage Grouping: IA HER2 Status: Positive (+) Oncotype Dx Recurrence Score: Not Appropriate AJCC T Category: pT1c PR Status: Negative (-) Intent of Therapy: Curative Intent, Discussed with Patient

## 2022-06-20 ENCOUNTER — Other Ambulatory Visit: Payer: Self-pay | Admitting: Internal Medicine

## 2022-06-21 ENCOUNTER — Inpatient Hospital Stay: Payer: 59

## 2022-06-21 DIAGNOSIS — C50912 Malignant neoplasm of unspecified site of left female breast: Secondary | ICD-10-CM

## 2022-06-21 MED ORDER — ONDANSETRON HCL 8 MG PO TABS: 8.0000 mg | ORAL_TABLET | Freq: Three times a day (TID) | ORAL | 1 refills | Status: DC | PRN

## 2022-06-21 MED ORDER — LIDOCAINE-PRILOCAINE 2.5-2.5 % EX CREA: TOPICAL_CREAM | CUTANEOUS | 3 refills | Status: DC

## 2022-06-21 MED ORDER — PROCHLORPERAZINE MALEATE 10 MG PO TABS: 10.0000 mg | ORAL_TABLET | Freq: Four times a day (QID) | ORAL | 1 refills | Status: DC | PRN

## 2022-06-22 ENCOUNTER — Ambulatory Visit: Payer: Self-pay | Admitting: Surgery

## 2022-06-22 ENCOUNTER — Encounter: Payer: Self-pay | Admitting: Internal Medicine

## 2022-06-22 NOTE — H&P (View-Only) (Signed)
Subjective:   CC: Malignant neoplasm of upper-inner quadrant of left breast in female, estrogen receptor negative (CMS/HHS-HCC) [C50.212, Z17.1] POSTOP  HPI:  Jennifer Garrison is a 61 y.o. female who is here for followup from above.      Current Medications: has a current medication list which includes the following prescription(s): alendronate, bupropion, buspirone, ibuprofen, and metronidazole.  Allergies:  Allergies  Allergen Reactions   Poison Ivy Extract Rash    ROS: General: Denies weight loss, weight gain, fatigue, fevers, chills, and night sweats. Heart: Denies chest pain, palpitations, racing heart, irregular heartbeat, leg pain or swelling, and decreased activity tolerance. Respiratory: Denies breathing difficulty, shortness of breath, wheezing, cough, and sputum. GI: Denies change in appetite, heartburn, nausea, vomiting, constipation, diarrhea, and blood in stool. GU: Denies difficulty urinating, pain with urinating, urgency, frequency, blood in urine    Objective:     There were no vitals taken for this visit.  Constitutional :  Alert, no distress, cooperative  Gastrointestinal: soft, non-tender; bowel sounds normal; no masses,  no organomegaly.   Musculoskeletal: Steady gait and movement  Skin: Cool and moist, incisions clean, dry, intact.  No erythema, induration or drainage to indicate infection.    Psychiatric: Normal affect, non-agitated, not confused       LABS:  Component 13 d ago  SURGICAL PATHOLOGY SURGICAL PATHOLOGY CASE: [id number] PATIENT: Jennifer Garrison Surgical Pathology Report     Specimen Submitted: A. Breast, left B. Breast, left posterior C. Sentinel lymph node 1, left D. Sentinel lymph node 2, left E. Sentinel lymph node 3, left  Clinical History: Malignant neoplasm of upper inner quadrant of left breast in female, estrogen receptor negative C50.212, Z17      DIAGNOSIS: A.  BREAST, LEFT; PARTIAL MASTECTOMY: - INVASIVE MAMMARY  CARCINOMA. - DUCTAL CARCINOMA IN SITU (DCIS). - SEE CANCER SUMMARY BELOW. - PRIOR BIOPSY SITE CHANGE AND CLIP.  B.  BREAST, LEFT POSTERIOR MARGIN; EXCISION: - BENIGN BREAST TISSUE, NEGATIVE FOR MALIGNANCY.  C.  SENTINEL LYMPH NODE, LEFT 1; EXCISION: - ONE LYMPH NODE NEGATIVE FOR MALIGNANCY (0/1).  D.  SENTINEL LYMPH NODE, LEFT 2; EXCISION: - ONE LYMPH NODE NEGATIVE FOR MALIGNANCY (0/1).  E.  SENTINEL LYMPH NODE, LEFT 3; EXCISION: - ONE LYMPH NODE NEGATIVE FOR MALIGNANCY (0/1).  CANCER CASE SUMMARY: INVASIVE CARCINOMA OF THE BREAST Standard(s): AJCC-UICC 8  SPECIMEN Procedure: Partial mastectomy Specimen Laterality: Left  TUMOR Histologic Type: Invasive carcinoma of no special type (ductal) Histologic Grade (Nottingham Histologic Score)      Glandular (Acinar)/Tubular Differentiation: 3      Nuclear Pleomorphism: 3      Mitotic Rate: 2      Overall Grade: 3 Tumor Size: 14 mm Tumor Focality: Single focus of invasive carcinoma Ductal Carcinoma In Situ (DCIS): Present, nuclear grade 2-3 with comedonecrosis Tumor Extent: Not applicable Lymphatic and/or Vascular Invasion: Present, focal (LVI in 1 block only) Treatment Effect in the Breast: No known presurgical therapy  MARGINS Margin Status for Invasive Carcinoma: All margins negative for invasive carcinoma      Distance from closest margin: 0.4 mm      Specify closest margin: Medial  Margin Status for DCIS: All margins negative for DCIS      Distance from DCIS to closest margin: 2 mm      Specify closest margin: Medial  REGIONAL LYMPH NODES Regional Lymph Node Status: All regional lymph nodes negative for tumor      Total Number of Lymph Nodes Examined (sentinel and non-sentinel): 3         Number of Sentinel Nodes Examined: 3  DISTANT METASTASIS Distant Site(s) Involved, if applicable: Not applicable  PATHOLOGIC STAGE CLASSIFICATION (pTNM, AJCC 8th Edition): Modified Classification: Not applicable pT Category:  pT1c T Suffix: Not applicable pN Category: pN0 N Suffix: (sn) pM Category: Not applicable  SPECIAL STUDIES Breast Biomarker Testing Performed on Previous Biopsy: [id number] Estrogen Receptor (ER) Status: NEGATIVE (LESS THAN 1%)         Internal control cells absent         No internal controls are present, but external controls are appropriately positive.  Progesterone Receptor (PgR) Status: NEGATIVE (LESS THAN 1%)         Internal control cells absent  HER2 (by immunohistochemistry): POSITIVE (Score 3+)      Percentage of cells with uniform intense complete membrane staining: 80%  Ki-67: Not performed  (v4.9.0.0)  Comment: In the main lumpectomy specimen (part A), invasive mammary carcinoma involves the deep margin. The re-excised posterior margin (part B) is negative. The above cancer staging summary incorporates the findings in all the above specimens and reflects the final margin status.  GROSS DESCRIPTION: Intraoperative Consultation:     Labeled: Left breast mass     Received: Fresh     Specimen: Partial mastectomy     Pathologic evaluation performed: Gross margin evaluation     Diagnosis: IOC: Mass present at posterior margin (black), heart clip present.     Communicated to: Called to Dr. Travez Garrison at 10:43 AM on 06/09/2022 Jennifer Rubinas MD     Tissue submitted: None  A. Labeled: Left breast mass Received: Fresh Specimen radiograph image(s) available for review Radiographic findings: A clip is present. Time in fixative: Collected at 10:25 AM on 06/09/2022 and placed in formalin at 10:43 AM on 06/09/2022 Cold ischemic time: Less than 30 minutes Total fixation time: Approximately 9.5 hours Type of procedure: Partial mastectomy Location / laterality of specimen: Left breast Orientation of specimen: The specimen is received inked and is oriented with clear plastic clips labeled as follows: A, P, M, L, S, and I. Inking: Anterior = green Inferior = blue Lateral =  orange Medial = yellow Posterior = black Superior = red Size of specimen: 4 (superior to inferior) x 3.1 (medial to lateral) x 1.9 (anterior to posterior) cm Skin: None grossly appreciated.  Biopsy site: There is a biopsy site which contains a heart-shaped clip.  Number of discrete masses: 1 Size of mass(es): 1.4 x 1.3 x 0.8 cm Description of mass(es): The mass is tan, firm, and well-circumscribed. Distance between masses/clips: The clip is located at the periphery of the mass. Margins: Posterior = less than 0.1 cm, anterior = 0.6 cm, medial = 0.5 cm, lateral = 1.4 cm, inferior = 1.1 cm, superior = 1 cm Description of remainder of tissue: The remainder of the specimen is comprised of yellow lobulated adipose tissue admixed with tan-white fine fibrous tissue.  The fat to fibrous tissue ratio is 95:5.  Block summary (approximately 80% of the mass is submitted and approximately 60% of the specimen remains.): 1 - inferior margin, perpendicularly sectioned 2 - closest inferior margins to mass 3 - 4 - representative sections of mass with closest approach to posterior, anterior, and medial margins      3 - clip site 5 - closest lateral margins to mass 6 - closest superior margins to mass 7 - 8 - superior margin, perpendicularly sectioned  Intraoperative Consultation:     Labeled: Left breast posterior margin     Received: Fresh       Specimen: Partial mastectomy     Pathologic evaluation performed: Gross margin evaluation     Diagnosis: IOC: No obvious mass     Communicated to: Called to Dr. Renalda Locklin at 11:08 AM on 06/09/2022 Jennifer Rubinas MD     Tissue submitted: None  B. Labeled: Left breast posterior margin Received: Fresh Specimen radiograph image(s) available for review Radiographic findings: None provided Time in fixative: Collected at 10:53 AM on 06/09/2022 and placed in formalin at 11:09 AM on 06/09/2022 Cold ischemic time: Less than 30 minutes Total fixation time:  Approximately 9.25 hours Type of procedure: Partial mastectomy Location / laterality of specimen: Left breast Orientation of specimen: The specimen is received inked. Inking: Anterior = green Inferior = blue Lateral = orange Medial = yellow Posterior = black Superior = red Size of specimen: 3.4 (medial to lateral) x 3 (superior to inferior) x 1.4 (anterior to posterior) cm Skin: None grossly appreciated.  Biopsy site: None grossly appreciated.  Number of discrete masses: None grossly appreciated. Description of tissue: Sectioning reveals yellow lobulated adipose tissue admixed with white fibrous tissue.  The fat to fibrous tissue ratio is 90:10.  Block summary (specimen submitted entirely): 1 - medial posterior margin, perpendicularly sectioned 2 - 6 - central sections, submitted entirely and sequentially from medial to lateral 7 - 8 - superior lateral margin, perpendicularly sectioned  C. Labeled: Left sentinel lymph node #1 Received: Fresh Collection time: 10:33 AM on 06/09/2022 Placed into formalin time: 12:07 PM on 06/09/2022 Tissue fragment(s): 1 Size: 2 x 1.1 x 0.8 cm Description: Received is a portion of yellow lobulated adipose tissue with an embedded 1.9 x 1 x 0.4 cm lymph node candidate. The lymph node candidate is serially sectioned and entirely submitted in cassettes 1-2.  D. Labeled: Left sentinel lymph node #2 Received: Fresh Collection time: 10:39 AM on 06/09/2022 Placed into formalin time: 12:07 PM on 06/09/2022 Tissue fragment(s): 1 Size: 1.4 x 1.1 x 0.5 cm Description: Received is a single fragment of yellow lobulated adipose tissue with an embedded 0.5 x 0.5 x 0.3 cm lymph node candidate. The lymph node candidate is entirely submitted in 1 cassette.  E. Labeled: Left sentinel lymph node #3 Received: Fresh Collection time: 10:44 AM on 06/09/2022 Placed into formalin time: 12:07 PM on 06/09/2022 Tissue fragment(s): 1 Size: 1.5 x 1 x 0.6  cm Description: Received is a fragment of yellow lobulated adipose tissue with an embedded 1 x 0.8 x 0.4 cm lymph node candidate. The lymph node candidate is bisected and entirely submitted in 1 cassette.  RB 06/09/2022    Final Diagnosis performed by Jennifer Rubinas, MD.   Electronically signed 06/10/2022 4:34:00PM The electronic signature indicates that the named Attending Pathologist has evaluated the specimen Technical component performed at [facility], [address], [place], [place] [address] Lab: [phone] Dir: Sanjai Nagendra, MD, MMM  Professional component performed at [facility], [facility], [address], [place], [place] [address] Lab: [phone] Dir: Heath M. Jones, MD    RADS: N/A  Assessment:      Malignant neoplasm of upper-inner quadrant of left breast in female, estrogen receptor negative (CMS/HHS-HCC) [C50.212, Z17.1] S/p lumpectomy, SLNB  Now recommended adjuvent chemo.  Will schedule port placement  Plan:     Risk alternative benefits discussed.  Risks include bleeding, infection, dislocation, migration, malfunction, unsuccessful placement, pneumothorax, and additional procedures to address that risks.  Benefits include initiation of chemotherapy.  Alternative includes non-IV infusion therapy.  We discussed the need for the port   to infuse IV chemotherapy agents, and how the port can be a temporary and/or permanent option depending on patient preference after completion of chemotherapy.  Port will be okay to use as soon as it is placed.  Patient verbalized understanding and all questions and concerns addressed.   labs/images/medications/previous chart entries reviewed personally and relevant changes/updates noted above. 

## 2022-06-22 NOTE — H&P (Signed)
Subjective:   CC: Malignant neoplasm of upper-inner quadrant of left breast in female, estrogen receptor negative (CMS/HHS-HCC) [id number], Z17.1] POSTOP  HPI:  Jennifer Garrison is a 62 y.o. female who is here for followup from above.      Current Medications: has a current medication list which includes the following prescription(s): alendronate, bupropion, buspirone, ibuprofen, and metronidazole.  Allergies:  Allergies  Allergen Reactions   Poison Ivy Extract Rash    ROS: General: Denies weight loss, weight gain, fatigue, fevers, chills, and night sweats. Heart: Denies chest pain, palpitations, racing heart, irregular heartbeat, leg pain or swelling, and decreased activity tolerance. Respiratory: Denies breathing difficulty, shortness of breath, wheezing, cough, and sputum. GI: Denies change in appetite, heartburn, nausea, vomiting, constipation, diarrhea, and blood in stool. GU: Denies difficulty urinating, pain with urinating, urgency, frequency, blood in urine    Objective:     There were no vitals taken for this visit.  Constitutional :  Alert, no distress, cooperative  Gastrointestinal: soft, non-tender; bowel sounds normal; no masses,  no organomegaly.   Musculoskeletal: Steady gait and movement  Skin: Cool and moist, incisions clean, dry, intact.  No erythema, induration or drainage to indicate infection.    Psychiatric: Normal affect, non-agitated, not confused       LABS:  Component 13 d ago  SURGICAL PATHOLOGY SURGICAL PATHOLOGY CASE: [phone] PATIENT: [place] Surgical Pathology Report     Specimen Submitted: A. Breast, left B. Breast, left posterior C. Sentinel lymph node 1, left D. Sentinel lymph node 2, left E. Sentinel lymph node 3, left  Clinical History: Malignant neoplasm of upper inner quadrant of left breast in female, estrogen receptor negative C50.212, Z17      DIAGNOSIS: A.  BREAST, LEFT; PARTIAL MASTECTOMY: - INVASIVE MAMMARY  CARCINOMA. - DUCTAL CARCINOMA IN SITU (DCIS). - SEE CANCER SUMMARY BELOW. - PRIOR BIOPSY SITE CHANGE AND CLIP.  B.  BREAST, LEFT POSTERIOR MARGIN; EXCISION: - BENIGN BREAST TISSUE, NEGATIVE FOR MALIGNANCY.  C.  SENTINEL LYMPH NODE, LEFT 1; EXCISION: - ONE LYMPH NODE NEGATIVE FOR MALIGNANCY (0/1).  D.  SENTINEL LYMPH NODE, LEFT 2; EXCISION: - ONE LYMPH NODE NEGATIVE FOR MALIGNANCY (0/1).  E.  SENTINEL LYMPH NODE, LEFT 3; EXCISION: - ONE LYMPH NODE NEGATIVE FOR MALIGNANCY (0/1).  CANCER CASE SUMMARY: INVASIVE CARCINOMA OF THE BREAST Standard(s): AJCC-UICC 8  SPECIMEN Procedure: Partial mastectomy Specimen Laterality: Left  TUMOR Histologic Type: Invasive carcinoma of no special type (ductal) Histologic Grade (Nottingham Histologic Score)      Glandular (Acinar)/Tubular Differentiation: 3      Nuclear Pleomorphism: 3      Mitotic Rate: 2      Overall Grade: 3 Tumor Size: 14 mm Tumor Focality: Single focus of invasive carcinoma Ductal Carcinoma In Situ (DCIS): Present, nuclear grade 2-3 with comedonecrosis Tumor Extent: Not applicable Lymphatic and/or Vascular Invasion: Present, focal (LVI in 1 block only) Treatment Effect in the Breast: No known presurgical therapy  MARGINS Margin Status for Invasive Carcinoma: All margins negative for invasive carcinoma      Distance from closest margin: 0.4 mm      Specify closest margin: Medial  Margin Status for DCIS: All margins negative for DCIS      Distance from DCIS to closest margin: 2 mm      Specify closest margin: Medial  REGIONAL LYMPH NODES Regional Lymph Node Status: All regional lymph nodes negative for tumor      Total Number of Lymph Nodes Examined (sentinel and non-sentinel): 3  Number of Sentinel Nodes Examined: 3  DISTANT METASTASIS Distant Site(s) Involved, if applicable: Not applicable  PATHOLOGIC STAGE CLASSIFICATION (pTNM, AJCC 8th Edition): Modified Classification: Not applicable pT Category:  pT1c T Suffix: Not applicable pN Category: pN0 N Suffix: (sn) pM Category: Not applicable  SPECIAL STUDIES Breast Biomarker Testing Performed on Previous Biopsy: [id number] Estrogen Receptor (ER) Status: NEGATIVE (LESS THAN 1%)         Internal control cells absent         No internal controls are present, but external controls are appropriately positive.  Progesterone Receptor (PgR) Status: NEGATIVE (LESS THAN 1%)         Internal control cells absent  HER2 (by immunohistochemistry): POSITIVE (Score 3+)      Percentage of cells with uniform intense complete membrane staining: 80%  Ki-67: Not performed  (v4.9.0.0)  Comment: In the main lumpectomy specimen (part A), invasive mammary carcinoma involves the deep margin. The re-excised posterior margin (part B) is negative. The above cancer staging summary incorporates the findings in all the above specimens and reflects the final margin status.  GROSS DESCRIPTION: Intraoperative Consultation:     Labeled: Left breast mass     Received: Fresh     Specimen: Partial mastectomy     Pathologic evaluation performed: Gross margin evaluation     Diagnosis: IOC: Mass present at posterior margin (black), heart clip present.     Communicated to: Called to Dr. [name] at 10:43 AM on 06/09/2022 Quay Burow MD     Tissue submitted: None  A. Labeled: Left breast mass Received: Fresh Specimen radiograph image(s) available for review Radiographic findings: A clip is present. Time in fixative: Collected at 10:25 AM on 06/09/2022 and placed in formalin at 10:43 AM on 06/09/2022 Cold ischemic time: Less than 30 minutes Total fixation time: Approximately 9.5 hours Type of procedure: Partial mastectomy Location / laterality of specimen: Left breast Orientation of specimen: The specimen is received inked and is oriented with clear plastic clips labeled as follows: A, P, M, L, S, and I. Inking: Anterior = green Inferior = blue Lateral =  orange Medial = yellow Posterior = black Superior = red Size of specimen: 4 (superior to inferior) x 3.1 (medial to lateral) x 1.9 (anterior to posterior) cm Skin: None grossly appreciated.  Biopsy site: There is a biopsy site which contains a heart-shaped clip.  Number of discrete masses: 1 Size of mass(es): 1.4 x 1.3 x 0.8 cm Description of mass(es): The mass is tan, firm, and well-circumscribed. Distance between masses/clips: The clip is located at the periphery of the mass. Margins: Posterior = less than 0.1 cm, anterior = 0.6 cm, medial = 0.5 cm, lateral = 1.4 cm, inferior = 1.1 cm, superior = 1 cm Description of remainder of tissue: The remainder of the specimen is comprised of yellow lobulated adipose tissue admixed with tan-white fine fibrous tissue.  The fat to fibrous tissue ratio is 95:5.  Block summary (approximately 80% of the mass is submitted and approximately 60% of the specimen remains.): 1 - inferior margin, perpendicularly sectioned 2 - closest inferior margins to mass 3 - 4 - representative sections of mass with closest approach to posterior, anterior, and medial margins      3 - clip site 5 - closest lateral margins to mass 6 - closest superior margins to mass 7 - 8 - superior margin, perpendicularly sectioned  Intraoperative Consultation:     Labeled: Left breast posterior margin     Received: Fresh  Specimen: Partial mastectomy     Pathologic evaluation performed: Gross margin evaluation     Diagnosis: IOC: No obvious mass     Communicated to: Called to Dr. [name] at 11:08 AM on 06/09/2022 Quay Burow MD     Tissue submitted: None  B. Labeled: Left breast posterior margin Received: Fresh Specimen radiograph image(s) available for review Radiographic findings: None provided Time in fixative: Collected at 10:53 AM on 06/09/2022 and placed in formalin at 11:09 AM on 06/09/2022 Cold ischemic time: Less than 30 minutes Total fixation time:  Approximately 9.25 hours Type of procedure: Partial mastectomy Location / laterality of specimen: Left breast Orientation of specimen: The specimen is received inked. Inking: Anterior = green Inferior = blue Lateral = orange Medial = yellow Posterior = black Superior = red Size of specimen: 3.4 (medial to lateral) x 3 (superior to inferior) x 1.4 (anterior to posterior) cm Skin: None grossly appreciated.  Biopsy site: None grossly appreciated.  Number of discrete masses: None grossly appreciated. Description of tissue: Sectioning reveals yellow lobulated adipose tissue admixed with white fibrous tissue.  The fat to fibrous tissue ratio is 90:10.  Block summary (specimen submitted entirely): 1 - medial posterior margin, perpendicularly sectioned 2 - 6 - central sections, submitted entirely and sequentially from medial to lateral 7 - 8 - superior lateral margin, perpendicularly sectioned  C. Labeled: Left sentinel lymph node #1 Received: Fresh Collection time: 10:33 AM on 06/09/2022 Placed into formalin time: 12:07 PM on 06/09/2022 Tissue fragment(s): 1 Size: 2 x 1.1 x 0.8 cm Description: Received is a portion of yellow lobulated adipose tissue with an embedded 1.9 x 1 x 0.4 cm lymph node candidate. The lymph node candidate is serially sectioned and entirely submitted in cassettes 1-2.  D. Labeled: Left sentinel lymph node #2 Received: Fresh Collection time: 10:39 AM on 06/09/2022 Placed into formalin time: 12:07 PM on 06/09/2022 Tissue fragment(s): 1 Size: 1.4 x 1.1 x 0.5 cm Description: Received is a single fragment of yellow lobulated adipose tissue with an embedded 0.5 x 0.5 x 0.3 cm lymph node candidate. The lymph node candidate is entirely submitted in 1 cassette.  E. Labeled: Left sentinel lymph node #3 Received: Fresh Collection time: 10:44 AM on 06/09/2022 Placed into formalin time: 12:07 PM on 06/09/2022 Tissue fragment(s): 1 Size: 1.5 x 1 x 0.6  cm Description: Received is a fragment of yellow lobulated adipose tissue with an embedded 1 x 0.8 x 0.4 cm lymph node candidate. The lymph node candidate is bisected and entirely submitted in 1 cassette.  RB 06/09/2022    Final Diagnosis performed by Quay Burow, MD.   Electronically signed 06/10/2022 4:34:00PM The electronic signature indicates that the named Attending Pathologist has evaluated the specimen Technical component performed at [facility], [address] [place], [place] [address] Lab: [phone] Dir: Rush Farmer, MD, MMM  Professional component performed at [facility], [facility], [place], [place], [place] [address] Lab: [phone] Dir: Kathi Simpers, MD    RADS: N/A  Assessment:      Malignant neoplasm of upper-inner quadrant of left breast in female, estrogen receptor negative (CMS/HHS-HCC) [id number], Z17.1] S/p lumpectomy, SLNB  Now recommended adjuvent chemo.  Will schedule port placement  Plan:     Risk alternative benefits discussed.  Risks include bleeding, infection, dislocation, migration, malfunction, unsuccessful placement, pneumothorax, and additional procedures to address that risks.  Benefits include initiation of chemotherapy.  Alternative includes non-IV infusion therapy.  We discussed the need for the port  to infuse IV chemotherapy agents, and how the port can be a temporary and/or permanent option depending on patient preference after completion of chemotherapy.  Port will be okay to use as soon as it is placed.  Patient verbalized understanding and all questions and concerns addressed.   labs/images/medications/previous chart entries reviewed personally and relevant changes/updates noted above.

## 2022-06-23 ENCOUNTER — Other Ambulatory Visit: Payer: Self-pay | Admitting: *Deleted

## 2022-06-23 ENCOUNTER — Ambulatory Visit: Payer: 59 | Admitting: Internal Medicine

## 2022-06-23 ENCOUNTER — Encounter
Admission: RE | Admit: 2022-06-23 | Discharge: 2022-06-23 | Disposition: A | Discharge status: Home / Self Care | Payer: 59 | Source: Ambulatory Visit | Attending: Surgery | Admitting: Surgery

## 2022-06-23 DIAGNOSIS — C50912 Malignant neoplasm of unspecified site of left female breast: Secondary | ICD-10-CM

## 2022-06-23 NOTE — Patient Instructions (Addendum)
Your procedure is scheduled on:06-30-22 Thursday Report to the Registration Desk on the 1st floor of the [facility].Then proceed to the 2nd floor Surgery Desk To find out your arrival time, please call [phone] between 1PM - 3PM on:06-29-22 Wednesday If your arrival time is 6:00 am, do not arrive before that time as the [facility] entrance doors do not open until 6:00 am.  REMEMBER: Instructions that are not followed completely may result in serious medical risk, up to and including death; or upon the discretion of your surgeon and anesthesiologist your surgery may need to be rescheduled.  Do not eat food after midnight the night before surgery.  No gum chewing or hard candies.  You may however, drink CLEAR liquids up to 2 hours before you are scheduled to arrive for your surgery. Do not drink anything within 2 hours of your scheduled arrival time.  Clear liquids include: - water  - apple juice without pulp - gatorade (not RED colors) - black coffee or tea (Do NOT add milk or creamers to the coffee or tea) Do NOT drink anything that is not on this list.  One week prior to surgery: Stop Anti-inflammatories (NSAIDS) such as Advil, Aleve, Ibuprofen, Motrin, Naproxen, Naprosyn and Aspirin based products such as Excedrin, Goody's Powder, BC Powder.You may however, take Tylenol if needed for pain up until the day of surgery.  Stop ANY OVER THE COUNTER supplements/vitamins NOW (06-23-22) until after surgery (Fiber Complex, Calcium, Vitex Berry, Milk Thistle Seeds)  TAKE ONLY THESE MEDICATIONS THE MORNING OF SURGERY WITH A SIP OF WATER: -buPROPion (WELLBUTRIN SR)  -You may take busPIRone (BUSPAR) if needed   No Alcohol for 24 hours before or after surgery.  No Smoking including e-cigarettes for 24 hours before surgery.  No chewable tobacco products for at least 6 hours before surgery.  No nicotine patches on the day of surgery.  Do not use any "recreational" drugs for at least a week  (preferably 2 weeks) before your surgery.  Please be advised that the combination of cocaine and anesthesia may have negative outcomes, up to and including death. If you test positive for cocaine, your surgery will be cancelled.  On the morning of surgery brush your teeth with toothpaste and water, you may rinse your mouth with mouthwash if you wish. Do not swallow any toothpaste or mouthwash.  Use CHG Soap as directed on instruction sheet.  Do not wear jewelry, make-up, hairpins, clips or nail polish.  Do not wear lotions, powders, or perfumes.   Do not shave body hair from the neck down 48 hours before surgery.  Contact lenses, hearing aids and dentures may not be worn into surgery.  Do not bring valuables to the hospital. [facility] is not responsible for any missing/lost belongings or valuables.   Notify your doctor if there is any change in your medical condition (cold, fever, infection).  Wear comfortable clothing (specific to your surgery type) to the hospital.  After surgery, you can help prevent lung complications by doing breathing exercises.  Take deep breaths and cough every 1-2 hours. Your doctor may order a device called an Incentive Spirometer to help you take deep breaths. When coughing or sneezing, hold a pillow firmly against your incision with both hands. This is called "splinting." Doing this helps protect your incision. It also decreases belly discomfort.  If you are being admitted to the hospital overnight, leave your suitcase in the car. After surgery it may be brought to your room.  In case of increased patient census, it may be necessary for you, the patient, to continue your postoperative care in the Same Day Surgery department.  If you are being discharged the day of surgery, you will not be allowed to drive home. You will need a responsible individual to drive you home and stay with you for 24 hours after surgery.   If you are taking public  transportation, you will need to have a responsible individual with you.  Please call the [facility]. at [phone] if you have any questions about these instructions.  Surgery Visitation Policy:  Patients having surgery or a procedure may have two visitors.  Children under the age of 68 must have an adult with them who is not the patient.     Preparing for Surgery with CHLORHEXIDINE GLUCONATE (CHG) Soap  Chlorhexidine Gluconate (CHG) Soap  o An antiseptic cleaner that kills germs and bonds with the skin to continue killing germs even after washing  o Used for showering the night before surgery and morning of surgery  Before surgery, you can play an important role by reducing the number of germs on your skin.  CHG (Chlorhexidine gluconate) soap is an antiseptic cleanser which kills germs and bonds with the skin to continue killing germs even after washing.  Please do not use if you have an allergy to CHG or antibacterial soaps. If your skin becomes reddened/irritated stop using the CHG.  1. Shower the NIGHT BEFORE SURGERY and the MORNING OF SURGERY with CHG soap.  2. If you choose to wash your hair, wash your hair first as usual with your normal shampoo.  3. After shampooing, rinse your hair and body thoroughly to remove the shampoo.  4. Use CHG as you would any other liquid soap. You can apply CHG directly to the skin and wash gently with a scrungie or a clean washcloth.  5. Apply the CHG soap to your body only from the neck down. Do not use on open wounds or open sores. Avoid contact with your eyes, ears, mouth, and genitals (private parts). Wash face and genitals (private parts) with your normal soap.  6. Wash thoroughly, paying special attention to the area where your surgery will be performed.  7. Thoroughly rinse your body with warm water.  8. Do not shower/wash with your normal soap after using and rinsing off the CHG soap.  9. Pat yourself dry  with a clean towel.  10. Wear clean pajamas to bed the night before surgery.  12. Place clean sheets on your bed the night of your first shower and do not sleep with pets.  13. Shower again with the CHG soap on the day of surgery prior to arriving at the hospital.  14. Do not apply any deodorants/lotions/powders.  15. Please wear clean clothes to the hospital.

## 2022-06-28 ENCOUNTER — Encounter: Payer: Self-pay | Admitting: *Deleted

## 2022-06-28 ENCOUNTER — Other Ambulatory Visit: Payer: Self-pay

## 2022-06-28 NOTE — Progress Notes (Signed)
Jennifer Garrison called to ask a couple questions about upcoming appointments and getting an appt. For a Wig.  Will ask Nicki to call her to set up wig appt.

## 2022-06-30 ENCOUNTER — Encounter
Admission: RE | Disposition: A | Discharge status: Home / Self Care | Payer: Self-pay | Source: Ambulatory Visit | Attending: Surgery

## 2022-06-30 ENCOUNTER — Ambulatory Visit: Payer: 59

## 2022-06-30 ENCOUNTER — Other Ambulatory Visit: Payer: Self-pay

## 2022-06-30 ENCOUNTER — Encounter: Payer: Self-pay | Admitting: Surgery

## 2022-06-30 ENCOUNTER — Ambulatory Visit: Payer: 59 | Admitting: Certified Registered Nurse Anesthetist

## 2022-06-30 ENCOUNTER — Ambulatory Visit
Admission: RE | Admit: 2022-06-30 | Discharge: 2022-06-30 | Disposition: A | Discharge status: Home / Self Care | Payer: 59 | Source: Ambulatory Visit | Attending: Surgery | Admitting: Surgery

## 2022-06-30 DIAGNOSIS — F419 Anxiety disorder, unspecified: Secondary | ICD-10-CM | POA: Diagnosis not present

## 2022-06-30 DIAGNOSIS — C50212 Malignant neoplasm of upper-inner quadrant of left female breast: Secondary | ICD-10-CM | POA: Insufficient documentation

## 2022-06-30 DIAGNOSIS — Z87891 Personal history of nicotine dependence: Secondary | ICD-10-CM | POA: Diagnosis not present

## 2022-06-30 DIAGNOSIS — Z171 Estrogen receptor negative status [ER-]: Secondary | ICD-10-CM | POA: Insufficient documentation

## 2022-06-30 DIAGNOSIS — M1990 Unspecified osteoarthritis, unspecified site: Secondary | ICD-10-CM | POA: Diagnosis not present

## 2022-06-30 HISTORY — PX: PORTACATH PLACEMENT: SHX2246

## 2022-06-30 SURGERY — INSERTION, TUNNELED CENTRAL VENOUS DEVICE, WITH PORT
Anesthesia: General | Site: Breast

## 2022-06-30 MED ORDER — KETOROLAC TROMETHAMINE 30 MG/ML IJ SOLN
INTRAMUSCULAR | Status: DC | PRN
  Administered 2022-06-30: 15 mg via INTRAVENOUS

## 2022-06-30 MED ORDER — LIDOCAINE-EPINEPHRINE 1 %-1:100000 IJ SOLN
INTRAMUSCULAR | Status: AC
  Filled 2022-06-30: qty 1

## 2022-06-30 MED ORDER — GLYCOPYRROLATE 0.2 MG/ML IJ SOLN
INTRAMUSCULAR | Status: DC | PRN
  Administered 2022-06-30: .2 mg via INTRAVENOUS

## 2022-06-30 MED ORDER — OXYCODONE HCL 5 MG/5ML PO SOLN: 5.0000 mg | Freq: Once | ORAL | Status: AC | PRN

## 2022-06-30 MED ORDER — HYDROCODONE-ACETAMINOPHEN 5-325 MG PO TABS: 1.0000 | ORAL_TABLET | Freq: Four times a day (QID) | ORAL | 0 refills | Status: DC | PRN

## 2022-06-30 MED ORDER — MIDAZOLAM HCL 2 MG/2ML IJ SOLN
INTRAMUSCULAR | Status: AC
  Filled 2022-06-30: qty 2

## 2022-06-30 MED ORDER — FENTANYL CITRATE (PF) 100 MCG/2ML IJ SOLN
INTRAMUSCULAR | Status: DC | PRN
  Administered 2022-06-30 (×2): 50 ug via INTRAVENOUS

## 2022-06-30 MED ORDER — PROPOFOL 10 MG/ML IV BOLUS
INTRAVENOUS | Status: AC
  Filled 2022-06-30: qty 20

## 2022-06-30 MED ORDER — LIDOCAINE HCL (CARDIAC) PF 100 MG/5ML IV SOSY
PREFILLED_SYRINGE | INTRAVENOUS | Status: DC | PRN
  Administered 2022-06-30: 60 mg via INTRAVENOUS

## 2022-06-30 MED ORDER — CHLORHEXIDINE GLUCONATE 0.12 % MT SOLN
15.0000 mL | Freq: Once | OROMUCOSAL | Status: AC
  Administered 2022-06-30: 15 mL via OROMUCOSAL

## 2022-06-30 MED ORDER — LACTATED RINGERS IV SOLN: INTRAVENOUS | Status: DC

## 2022-06-30 MED ORDER — BUPIVACAINE HCL (PF) 0.5 % IJ SOLN
INTRAMUSCULAR | Status: AC
  Filled 2022-06-30: qty 30

## 2022-06-30 MED ORDER — CEFAZOLIN SODIUM-DEXTROSE 2-4 GM/100ML-% IV SOLN
2.0000 g | INTRAVENOUS | Status: AC
  Administered 2022-06-30: 2 g via INTRAVENOUS

## 2022-06-30 MED ORDER — HEPARIN SOD (PORK) LOCK FLUSH 100 UNIT/ML IV SOLN
INTRAVENOUS | Status: DC | PRN
  Administered 2022-06-30: 500 [IU] via INTRAVENOUS

## 2022-06-30 MED ORDER — EPHEDRINE SULFATE (PRESSORS) 50 MG/ML IJ SOLN
INTRAMUSCULAR | Status: DC | PRN
  Administered 2022-06-30: 10 mg via INTRAVENOUS

## 2022-06-30 MED ORDER — PROPOFOL 10 MG/ML IV BOLUS
INTRAVENOUS | Status: DC | PRN
  Administered 2022-06-30: 150 mg via INTRAVENOUS

## 2022-06-30 MED ORDER — ONDANSETRON HCL 4 MG/2ML IJ SOLN
INTRAMUSCULAR | Status: DC | PRN
  Administered 2022-06-30: 4 mg via INTRAVENOUS

## 2022-06-30 MED ORDER — HEPARIN SOD (PORK) LOCK FLUSH 100 UNIT/ML IV SOLN
INTRAVENOUS | Status: AC
  Filled 2022-06-30: qty 5

## 2022-06-30 MED ORDER — MIDAZOLAM HCL 2 MG/2ML IJ SOLN
INTRAMUSCULAR | Status: DC | PRN
  Administered 2022-06-30: 2 mg via INTRAVENOUS

## 2022-06-30 MED ORDER — FENTANYL CITRATE (PF) 100 MCG/2ML IJ SOLN
INTRAMUSCULAR | Status: AC
  Filled 2022-06-30: qty 2

## 2022-06-30 MED ORDER — ACETAMINOPHEN 10 MG/ML IV SOLN: 1000.0000 mg | Freq: Once | INTRAVENOUS | Status: DC | PRN

## 2022-06-30 MED ORDER — LIDOCAINE-EPINEPHRINE (PF) 1 %-1:200000 IJ SOLN
INTRAMUSCULAR | Status: DC | PRN
  Administered 2022-06-30: 5 mL via INTRAMUSCULAR

## 2022-06-30 MED ORDER — FAMOTIDINE 20 MG PO TABS
20.0000 mg | ORAL_TABLET | Freq: Once | ORAL | Status: AC
  Administered 2022-06-30: 20 mg via ORAL

## 2022-06-30 MED ORDER — ONDANSETRON HCL 4 MG/2ML IJ SOLN: 4.0000 mg | Freq: Once | INTRAMUSCULAR | Status: DC | PRN

## 2022-06-30 MED ORDER — OXYCODONE HCL 5 MG PO TABS
ORAL_TABLET | ORAL | Status: AC
  Filled 2022-06-30: qty 1

## 2022-06-30 MED ORDER — ORAL CARE MOUTH RINSE: 15.0000 mL | Freq: Once | OROMUCOSAL | Status: AC

## 2022-06-30 MED ORDER — CEFAZOLIN SODIUM-DEXTROSE 2-4 GM/100ML-% IV SOLN
INTRAVENOUS | Status: AC
  Filled 2022-06-30: qty 100

## 2022-06-30 MED ORDER — PHENYLEPHRINE 80 MCG/ML (10ML) SYRINGE FOR IV PUSH (FOR BLOOD PRESSURE SUPPORT)
PREFILLED_SYRINGE | INTRAVENOUS | Status: DC | PRN
  Administered 2022-06-30 (×3): 80 ug via INTRAVENOUS

## 2022-06-30 MED ORDER — DEXAMETHASONE SODIUM PHOSPHATE 10 MG/ML IJ SOLN
INTRAMUSCULAR | Status: DC | PRN
  Administered 2022-06-30: 10 mg via INTRAVENOUS

## 2022-06-30 MED ORDER — FENTANYL CITRATE (PF) 100 MCG/2ML IJ SOLN: 25.0000 ug | INTRAMUSCULAR | Status: DC | PRN

## 2022-06-30 MED ORDER — HEPARIN SODIUM (PORCINE) 5000 UNIT/ML IJ SOLN
INTRAMUSCULAR | Status: AC
  Filled 2022-06-30: qty 1

## 2022-06-30 MED ORDER — CHLORHEXIDINE GLUCONATE CLOTH 2 % EX PADS: 6.0000 | MEDICATED_PAD | Freq: Once | CUTANEOUS | Status: DC

## 2022-06-30 MED ORDER — OXYCODONE HCL 5 MG PO TABS
5.0000 mg | ORAL_TABLET | Freq: Once | ORAL | Status: AC | PRN
  Administered 2022-06-30: 5 mg via ORAL

## 2022-06-30 MED ORDER — CHLORHEXIDINE GLUCONATE 0.12 % MT SOLN
OROMUCOSAL | Status: AC
  Filled 2022-06-30: qty 15

## 2022-06-30 MED ORDER — FAMOTIDINE 20 MG PO TABS
ORAL_TABLET | ORAL | Status: AC
  Filled 2022-06-30: qty 1

## 2022-06-30 SURGICAL SUPPLY — 38 items
APL PRP STRL LF DISP 70% ISPRP (MISCELLANEOUS) ×1
APL SKNCLS STERI-STRIP NONHPOA (GAUZE/BANDAGES/DRESSINGS) ×1
BAG DECANTER FOR FLEXI CONT (MISCELLANEOUS) ×1 IMPLANT
BENZOIN TINCTURE PRP APPL 2/3 (GAUZE/BANDAGES/DRESSINGS) ×1 IMPLANT
BLADE SURG SZ11 CARB STEEL (BLADE) ×1 IMPLANT
BOOT SUTURE VASCULAR YLW (MISCELLANEOUS) ×1
CHLORAPREP W/TINT 26 (MISCELLANEOUS) ×1 IMPLANT
CLAMP SUTURE YELLOW 5 PAIRS (MISCELLANEOUS) ×1 IMPLANT
COVER LIGHT HANDLE STERIS (MISCELLANEOUS) ×2 IMPLANT
DRAPE C-ARM XRAY 36X54 (DRAPES) ×1 IMPLANT
DRSG TEGADERM 4X4.75 (GAUZE/BANDAGES/DRESSINGS) ×1 IMPLANT
ELECT CAUTERY BLADE 6.4 (BLADE) ×1 IMPLANT
ELECT REM PT RETURN 9FT ADLT (ELECTROSURGICAL) ×1
ELECTRODE REM PT RTRN 9FT ADLT (ELECTROSURGICAL) ×1 IMPLANT
GAUZE 4X4 16PLY ~~LOC~~+RFID DBL (SPONGE) ×1 IMPLANT
GLOVE BIOGEL PI IND STRL 7.0 (GLOVE) ×1 IMPLANT
GLOVE SURG SYN 6.5 ES PF (GLOVE) ×3 IMPLANT
GLOVE SURG SYN 6.5 PF PI (GLOVE) ×1 IMPLANT
GOWN STRL REUS W/ TWL LRG LVL3 (GOWN DISPOSABLE) ×2 IMPLANT
GOWN STRL REUS W/TWL LRG LVL3 (GOWN DISPOSABLE) ×3
IV NS 500ML (IV SOLUTION) ×1
IV NS 500ML BAXH (IV SOLUTION) ×1 IMPLANT
KIT PORT POWER 8FR ISP CVUE (Port) ×1 IMPLANT
KIT TURNOVER KIT A (KITS) ×1 IMPLANT
LABEL OR SOLS (LABEL) ×1 IMPLANT
MANIFOLD NEPTUNE II (INSTRUMENTS) ×1 IMPLANT
PACK PORT-A-CATH (MISCELLANEOUS) ×1 IMPLANT
SPIKE FLUID TRANSFER (MISCELLANEOUS) ×1 IMPLANT
SPONGE VERSALON 4X4 4PLY (MISCELLANEOUS) ×1 IMPLANT
STRIP CLOSURE SKIN 1/2X4 (GAUZE/BANDAGES/DRESSINGS) ×1 IMPLANT
SUT MNCRL AB 4-0 PS2 18 (SUTURE) ×1 IMPLANT
SUT PROLENE 2 0 SH DA (SUTURE) ×1 IMPLANT
SUT VIC AB 3-0 SH 27 (SUTURE) ×1
SUT VIC AB 3-0 SH 27X BRD (SUTURE) ×1 IMPLANT
SYR 10ML LL (SYRINGE) ×1 IMPLANT
TAG SUTURE CLAMP YLW 5PR (MISCELLANEOUS) ×1
TRAP FLUID SMOKE EVACUATOR (MISCELLANEOUS) ×1 IMPLANT
WATER STERILE IRR 500ML POUR (IV SOLUTION) ×1 IMPLANT

## 2022-06-30 NOTE — Anesthesia Postprocedure Evaluation (Signed)
Anesthesia Post Note  Patient: Retail banker  Procedure(s) Performed: INSERTION PORT-A-CATH (Breast)  Patient location during evaluation: PACU Anesthesia Type: General Level of consciousness: awake and alert, oriented and patient cooperative Pain management: pain level controlled Vital Signs Assessment: post-procedure vital signs reviewed and stable Respiratory status: spontaneous breathing, nonlabored ventilation and respiratory function stable Cardiovascular status: blood pressure returned to baseline and stable Postop Assessment: adequate PO intake Anesthetic complications: no   No notable events documented.   Last Vitals:  Vitals:   06/30/22 1055 06/30/22 1100  BP:  115/73  Pulse: 66 67  Resp: 10 18  Temp:  36.5 C  SpO2: 97% 98%    Last Pain:  Vitals:   06/30/22 1100  TempSrc:   PainSc: [place]

## 2022-06-30 NOTE — Op Note (Signed)
--  OP NOTE  DATE OF PROCEDURE: 06/30/2022   SURGEON: [name]  ANESTHESIA: LMA  PRE-OPERATIVE DIAGNOSIS: LEFT breast cancer requring port for chemotherapy   POST-OPERATIVE DIAGNOSIS: same  PROCEDURE(S):  1.) Percutaneous access of RIJ vein under ultrasound guidance   2.) Insertion of tunneled RIJ  central venous catheter with subcutaneous port  INTRAOPERATIVE FINDINGS: Patent easily compressible RIJ  vein with appropriate respiratory variations and well-secured tunneled central venous catheter with subcutaneous port at completion of the procedure, heplocked after confirming ease of draw and push  ESTIMATED BLOOD LOSS: Minimal (<20 mL)   SPECIMENS: None   IMPLANTS: 97F tunneled Bard PowerPort central venous catheter with subcutaneous port  DRAINS: None   COMPLICATIONS: None apparent   CONDITION AT COMPLETION: Hemodynamically stable, awake   DISPOSITION: PACU   INDICATION(S) FOR PROCEDURE:  Patient is a 62 y.o. female who presented with above diagnosis.  All risks, benefits, and alternatives to above elective procedures were discussed with the patient, who elected to proceed, and informed consent was accordingly obtained at that time.  DETAILS OF PROCEDURE:  Patient was brought to the operative suite and appropriately identified. In Trendelenburg position, RIJ  IJ venous access site was prepped and draped in the usual sterile fashion, and following a timeout, percutaneous venous access was obtained under ultrasound guidance using Seldinger technique, by which local anesthetic was injected over the area, and access needle was inserted under direct ultrasound visualization through which soft guidewire was advanced with no resistance, over which access needle was withdrawn. Guidewire was secured, attention was directed to injection of local anesthetic along the planned tunnel site, 2-3 cm transverse right chest incision was made and confirmed to accommodate the subcutaneous port, and flushed  catheter was tunneled retrograde from the port site over to the vein access site. Insertion sheath was advanced over the guidewire, which was withdrawn along with the insertion sheath dilator with no resistance. The catheter was introduced through the sheath and tip left in the [place] junction under fluoro guidance and catheter cut to appropriate length.  Catheter connected to port and placed within planned fixation site.  Fluro confirmed no kink within the entire length of the catheter at this point.  Port fixed to the pocket on two side to avoid twisting. Port was confirmed to withdraw blood and flush easily with included Heuber needle, and port heplocked. Dermis at the subcutaneous pocket was re-approximated using buried interrupted 3-0 Vicryl suture, and 4-0 Monocryl suture was used to re-approximate skin at the insertion and subcutaneous port sites in running subcuticular fashion.  Incisions then dressed with steristrips, 2x2, and tegaderm. Patient was then awakened from anesthesia and transferred to PACU in stable condition.  [place] images saved in paper chart and fluoro images saved in Epic.  CXR post op confirmed proper placement of port and no evidence of pneumothorax.

## 2022-06-30 NOTE — Discharge Instructions (Addendum)
Port placement, Care After This sheet gives you information about how to care for yourself after your procedure. Your health care provider may also give you more specific instructions. If you have problems or questions, contact your health care provider. What can I expect after the procedure? After the procedure, it is common to have: Soreness. Bruising. Itching. Follow these instructions at home: site care Follow instructions from your health care provider about how to take care of your site. Make sure you: Wash your hands with soap and water before and after you change your bandage (dressing). If soap and water are not available, use hand sanitizer. Leave stitches (sutures), skin glue, or adhesive strips in place. These skin closures may need to stay in place for 2 weeks or longer. If adhesive strip edges start to loosen and curl up, you may trim the loose edges. Do not remove adhesive strips completely unless your health care provider tells you to do that. If the area bleeds or bruises, apply gentle pressure for 10 minutes. OK TO REMOVE OUTER DRESSING AND SHOWER IN 48HRS.  KEEP STERISTRIPS INTACT  Check your site every day for signs of infection. Check for: Redness, swelling, or pain. Fluid or blood. Warmth. Pus or a bad smell.  General instructions Rest and then return to your normal activities as told by your health care provider.  tylenol and advil as needed for discomfort.  Please alternate between the two every four hours as needed for pain.    Use narcotics, if prescribed, only when tylenol and motrin is not enough to control pain.  325-650mg  every 8hrs to max of 3000mg /24hrs (including the 325mg  in every norco dose) for the tylenol.    Advil up to 800mg  per dose every 8hrs as needed for pain.   Keep all follow-up visits as told by your health care provider. This is important. Contact a health care provider if: You have redness, swelling, or pain around your site. You have  fluid or blood coming from your site. Your site feels warm to the touch. You have pus or a bad smell coming from your site. You have a fever. Your sutures, skin glue, or adhesive strips loosen or come off sooner than expected. Get help right away if: You have bleeding that does not stop with pressure or a dressing. Summary After the procedure, it is common to have some soreness, bruising, and itching at the site. Follow instructions from your health care provider about how to take care of your site. Check your site every day for signs of infection. Contact a health care provider if you have redness, swelling, or pain around your site, or your site feels warm to the touch. Keep all follow-up visits as told by your health care provider. This is important. This information is not intended to replace advice given to you by your health care provider. Make sure you discuss any questions you have with your health care provider. Document Released: 04/10/2015 Document Revised: 09/11/2017 Document Reviewed: 09/11/2017 Elsevier Interactive Patient Education  2019 [place]   The drugs that you were given will stay in your system until tomorrow so for the next 24 hours you should not:  Drive an automobile Make any legal decisions Drink any alcoholic beverage   You may resume regular meals tomorrow.  Today it is better to start with liquids and gradually work up to solid foods.  You may eat anything you prefer, but it is  better to start with liquids, then soup and crackers, and gradually work up to solid foods.   Please notify your doctor immediately if you have any unusual bleeding, trouble breathing, redness and pain at the surgery site, drainage, fever, or pain not relieved by medication.    Additional Instructions:  Please contact your physician with any problems or Same Day Surgery at [phone], Monday through Friday 6 am to 4 pm,  or [place] at [facility] number at [phone].

## 2022-06-30 NOTE — Transfer of Care (Signed)
Immediate Anesthesia Transfer of Care Note  Patient: Jennifer Garrison  Procedure(s) Performed: INSERTION PORT-A-CATH (Breast)  Patient Location: PACU  Anesthesia Type:General  Level of Consciousness: awake, alert , and oriented  Airway & Oxygen Therapy: Patient Spontanous Breathing and Patient connected to face mask oxygen  Post-op Assessment: Report given to RN and Post -op Vital signs reviewed and stable  Post vital signs: Reviewed and stable  Last Vitals:  Vitals Value Taken Time  BP 112/71 06/30/22 1033  Temp    Pulse 81 06/30/22 1035  Resp 9 06/30/22 1035  SpO2 100 % 06/30/22 1035  Vitals shown include unvalidated device data.  Last Pain:  Vitals:   06/30/22 0759  TempSrc: Temporal  PainSc: 0-No pain         Complications: No notable events documented.

## 2022-06-30 NOTE — Anesthesia Procedure Notes (Signed)
Procedure Name: LMA Insertion Date/Time: 06/30/2022 9:31 AM  Performed by: Willette Alma, CRNAPre-anesthesia Checklist: Patient identified, Patient being monitored, Timeout performed, Emergency Drugs available and Suction available Patient Re-evaluated:Patient Re-evaluated prior to induction Oxygen Delivery Method: Circle system utilized Preoxygenation: Pre-oxygenation with 100% oxygen Induction Type: IV induction Ventilation: Mask ventilation without difficulty LMA: LMA inserted LMA Size: 3.0 Tube type: Oral Number of attempts: 1 Placement Confirmation: positive ETCO2 and breath sounds checked- equal and bilateral Tube secured with: Tape Dental Injury: Teeth and Oropharynx as per pre-operative assessment

## 2022-06-30 NOTE — Anesthesia Preprocedure Evaluation (Addendum)
Anesthesia Evaluation  Patient identified by MRN, date of birth, ID band Patient awake    Reviewed: Allergy & Precautions, NPO status , Patient's Chart, lab work & pertinent test results  History of Anesthesia Complications Negative for: history of anesthetic complications  Airway Mallampati: IV   Neck ROM: Full    Dental no notable dental hx.    Pulmonary former smoker (quit 1994)   Pulmonary exam normal breath sounds clear to auscultation       Cardiovascular Exercise Tolerance: Good negative cardio ROS Normal cardiovascular exam Rhythm:Regular Rate:Normal     Neuro/Psych  PSYCHIATRIC DISORDERS Anxiety     negative neurological ROS     GI/Hepatic negative GI ROS,,,  Endo/Other  negative endocrine ROS    Renal/GU negative Renal ROS     Musculoskeletal  (+) Arthritis ,    Abdominal   Peds  Hematology Breast CA   Anesthesia Other Findings   Reproductive/Obstetrics                             Anesthesia Physical Anesthesia Plan  ASA: 2  Anesthesia Plan: General   Post-op Pain Management:    Induction: Intravenous  PONV Risk Score and Plan: 3 and Treatment may vary due to age or medical condition, Ondansetron and Dexamethasone  Airway Management Planned: LMA  Additional Equipment:   Intra-op Plan:   Post-operative Plan: Extubation in OR  Informed Consent: I have reviewed the patients History and Physical, chart, labs and discussed the procedure including the risks, benefits and alternatives for the proposed anesthesia with the patient or authorized representative who has indicated his/her understanding and acceptance.     Dental advisory given  Plan Discussed with: CRNA  Anesthesia Plan Comments: (Patient consented for risks of anesthesia including but not limited to:  - adverse reactions to medications - damage to eyes, teeth, lips or other oral mucosa - nerve damage  due to positioning  - sore throat or hoarseness - damage to heart, brain, nerves, lungs, other parts of body or loss of life  Informed patient about role of CRNA in peri- and intra-operative care.  Patient voiced understanding.)       Anesthesia Quick Evaluation

## 2022-06-30 NOTE — Interval H&P Note (Signed)
No change. OK to proceed.

## 2022-07-05 ENCOUNTER — Telehealth: Payer: Self-pay | Admitting: *Deleted

## 2022-07-05 ENCOUNTER — Other Ambulatory Visit: Payer: Self-pay | Admitting: Internal Medicine

## 2022-07-05 MED ORDER — LORAZEPAM 0.5 MG PO TABS: 0.5000 mg | ORAL_TABLET | Freq: Three times a day (TID) | ORAL | 0 refills | Status: AC | PRN

## 2022-07-05 NOTE — Telephone Encounter (Signed)
Call returned to patient and advised of prescription sent to pharmacy,She asked if this is acute or what then her phone went dead. I called her back and it went to voice mail. I left her a message that this is to help her get to her first treatment and if she had more questions to call me back.   She called me back as I was typing this message and she asked when she is to start the medicine because she is really anxious. I told her to start it as soon as she picks up the prescription. She was relieved to hear this and thanked me for letting her know

## 2022-07-05 NOTE — Telephone Encounter (Signed)
Call from [place], [place] with [facility] stating that she spoke with patient today and that patient is very anxious about her upcoming chemotherapy. She reports that patient has a h/o anxiety and is on Wellbutrin for it; patient reports she is having difficulty wanting to get out of bed. She is asking if Dr A would perhaps like to order something such as Ativan as needed to calm patient so she is able to have treatment more relaxed. Please advise

## 2022-07-12 ENCOUNTER — Ambulatory Visit: Payer: 59 | Admitting: Internal Medicine

## 2022-07-12 ENCOUNTER — Encounter: Payer: Self-pay | Admitting: *Deleted

## 2022-07-12 ENCOUNTER — Other Ambulatory Visit: Payer: 59

## 2022-07-12 ENCOUNTER — Other Ambulatory Visit: Payer: Self-pay

## 2022-07-12 ENCOUNTER — Ambulatory Visit: Payer: 59

## 2022-07-12 NOTE — Progress Notes (Signed)
Jennifer Garrison called to discuss scalp cooling options and see if she can meet with dietician sooner.   She will have a phone visit with [facility] tomorrow.   She has decided against the scalp cooling at this time.

## 2022-07-13 ENCOUNTER — Inpatient Hospital Stay: Payer: 59

## 2022-07-14 ENCOUNTER — Ambulatory Visit
Admission: RE | Admit: 2022-07-14 | Discharge: 2022-07-14 | Disposition: A | Discharge status: Home / Self Care | Payer: 59 | Source: Ambulatory Visit | Attending: Internal Medicine | Admitting: Internal Medicine

## 2022-07-14 DIAGNOSIS — Z171 Estrogen receptor negative status [ER-]: Secondary | ICD-10-CM | POA: Insufficient documentation

## 2022-07-14 DIAGNOSIS — I081 Rheumatic disorders of both mitral and tricuspid valves: Secondary | ICD-10-CM | POA: Diagnosis not present

## 2022-07-14 DIAGNOSIS — C50912 Malignant neoplasm of unspecified site of left female breast: Secondary | ICD-10-CM | POA: Insufficient documentation

## 2022-07-14 DIAGNOSIS — Z0189 Encounter for other specified special examinations: Secondary | ICD-10-CM | POA: Diagnosis not present

## 2022-07-14 DIAGNOSIS — Z01818 Encounter for other preprocedural examination: Secondary | ICD-10-CM | POA: Diagnosis not present

## 2022-07-14 LAB — ECHOCARDIOGRAM COMPLETE
AR max vel: 2.92 cm2
AV Area VTI: 2.85 cm2
AV Area mean vel: 2.81 cm2
AV Mean grad: 2 mmHg
AV Peak grad: 3.9 mmHg
Ao pk vel: 0.99 m/s
Area-P 1/2: 3.48 cm2
Calc EF: 58.6 %
MV VTI: 2.26 cm2
S' Lateral: 3 cm
Single Plane A2C EF: 53.8 %
Single Plane A4C EF: 60.7 %

## 2022-07-14 NOTE — Progress Notes (Signed)
*  PRELIMINARY RESULTS* Echocardiogram 2D Echocardiogram has been performed.  Carolyne Fiscal 07/14/2022, 11:01 AM

## 2022-07-15 MED FILL — Dexamethasone Sodium Phosphate Inj 100 MG/10ML: INTRAMUSCULAR | Qty: 1 | Status: AC

## 2022-07-18 ENCOUNTER — Inpatient Hospital Stay: Payer: 59 | Attending: Internal Medicine

## 2022-07-18 ENCOUNTER — Inpatient Hospital Stay: Payer: 59

## 2022-07-18 ENCOUNTER — Encounter: Payer: Self-pay | Admitting: Internal Medicine

## 2022-07-18 ENCOUNTER — Inpatient Hospital Stay (HOSPITAL_BASED_OUTPATIENT_CLINIC_OR_DEPARTMENT_OTHER): Payer: 59 | Admitting: Internal Medicine

## 2022-07-18 VITALS — BP 130/67 | HR 57 | Temp 97.6°F | Resp 20 | Wt 113.6 lb

## 2022-07-18 VITALS — BP 120/75 | HR 60 | Temp 98.0°F | Resp 16

## 2022-07-18 DIAGNOSIS — C50912 Malignant neoplasm of unspecified site of left female breast: Secondary | ICD-10-CM

## 2022-07-18 DIAGNOSIS — D6481 Anemia due to antineoplastic chemotherapy: Secondary | ICD-10-CM

## 2022-07-18 DIAGNOSIS — Z171 Estrogen receptor negative status [ER-]: Secondary | ICD-10-CM

## 2022-07-18 DIAGNOSIS — Z5111 Encounter for antineoplastic chemotherapy: Secondary | ICD-10-CM | POA: Diagnosis not present

## 2022-07-18 DIAGNOSIS — F419 Anxiety disorder, unspecified: Secondary | ICD-10-CM | POA: Insufficient documentation

## 2022-07-18 DIAGNOSIS — D0512 Intraductal carcinoma in situ of left breast: Secondary | ICD-10-CM | POA: Insufficient documentation

## 2022-07-18 DIAGNOSIS — G629 Polyneuropathy, unspecified: Secondary | ICD-10-CM | POA: Insufficient documentation

## 2022-07-18 DIAGNOSIS — Z5112 Encounter for antineoplastic immunotherapy: Secondary | ICD-10-CM | POA: Diagnosis not present

## 2022-07-18 DIAGNOSIS — Z79899 Other long term (current) drug therapy: Secondary | ICD-10-CM | POA: Insufficient documentation

## 2022-07-18 DIAGNOSIS — T451X5A Adverse effect of antineoplastic and immunosuppressive drugs, initial encounter: Secondary | ICD-10-CM

## 2022-07-18 DIAGNOSIS — F32A Depression, unspecified: Secondary | ICD-10-CM

## 2022-07-18 LAB — CMP (CANCER CENTER ONLY)
ALT: 14 U/L (ref 0–44)
AST: 19 U/L (ref 15–41)
Albumin: 3.9 g/dL (ref 3.5–5.0)
Alkaline Phosphatase: 46 U/L (ref 38–126)
Anion gap: 8 (ref 5–15)
BUN: 20 mg/dL (ref 8–23)
CO2: 24 mmol/L (ref 22–32)
Calcium: 8.8 mg/dL — ABNORMAL LOW (ref 8.9–10.3)
Chloride: 105 mmol/L (ref 98–111)
Creatinine: 0.83 mg/dL (ref 0.44–1.00)
GFR, Estimated: >60 mL/min (ref >60)
Glucose, Bld: 130 mg/dL — ABNORMAL HIGH (ref 70–99)
Potassium: 3.8 mmol/L (ref 3.5–5.1)
Sodium: 137 mmol/L (ref 135–145)
Total Bilirubin: 0.5 mg/dL (ref 0.3–1.2)
Total Protein: 6.5 g/dL (ref 6.5–8.1)

## 2022-07-18 LAB — CBC WITH DIFFERENTIAL (CANCER CENTER ONLY)
Abs Immature Granulocytes: 0.02 10*3/uL (ref 0.00–0.07)
Basophils Absolute: 0.1 10*3/uL (ref 0.0–0.1)
Basophils Relative: 1 %
Eosinophils Absolute: 0.2 10*3/uL (ref 0.0–0.5)
Eosinophils Relative: 3 %
HCT: 40.1 % (ref 36.0–46.0)
Hemoglobin: 13.2 g/dL (ref 12.0–15.0)
Immature Granulocytes: 0 %
Lymphocytes Relative: 20 %
Lymphs Abs: 1.6 10*3/uL (ref 0.7–4.0)
MCH: 29.2 pg (ref 26.0–34.0)
MCHC: 32.9 g/dL (ref 30.0–36.0)
MCV: 88.7 fL (ref 80.0–100.0)
Monocytes Absolute: 0.5 10*3/uL (ref 0.1–1.0)
Monocytes Relative: 6 %
Neutro Abs: 5.5 10*3/uL (ref 1.7–7.7)
Neutrophils Relative %: 70 %
Platelet Count: 220 10*3/uL (ref 150–400)
RBC: 4.52 MIL/uL (ref 3.87–5.11)
RDW: 12.5 % (ref 11.5–15.5)
WBC Count: 7.8 10*3/uL (ref 4.0–10.5)
nRBC: 0 % (ref 0.0–0.2)

## 2022-07-18 MED ORDER — DIPHENHYDRAMINE HCL 50 MG/ML IJ SOLN
50.0000 mg | Freq: Once | INTRAMUSCULAR | Status: AC
  Administered 2022-07-18: 50 mg via INTRAVENOUS
  Filled 2022-07-18: qty 1

## 2022-07-18 MED ORDER — HEPARIN SOD (PORK) LOCK FLUSH 100 UNIT/ML IV SOLN
INTRAVENOUS | Status: AC
  Administered 2022-07-18: 500 [IU]
  Filled 2022-07-18: qty 5

## 2022-07-18 MED ORDER — SODIUM CHLORIDE 0.9 % IV SOLN
Freq: Once | INTRAVENOUS | Status: AC
  Filled 2022-07-18: qty 250

## 2022-07-18 MED ORDER — SODIUM CHLORIDE 0.9 % IV SOLN
10.0000 mg | Freq: Once | INTRAVENOUS | Status: AC
  Administered 2022-07-18: 10 mg via INTRAVENOUS
  Filled 2022-07-18: qty 10

## 2022-07-18 MED ORDER — TRASTUZUMAB-ANNS CHEMO 150 MG IV SOLR
4.0000 mg/kg | Freq: Once | INTRAVENOUS | Status: AC
  Administered 2022-07-18: 210 mg via INTRAVENOUS
  Filled 2022-07-18: qty 10

## 2022-07-18 MED ORDER — FAMOTIDINE IN NACL 20-0.9 MG/50ML-% IV SOLN
20.0000 mg | Freq: Once | INTRAVENOUS | Status: AC
  Administered 2022-07-18: 20 mg via INTRAVENOUS
  Filled 2022-07-18: qty 50

## 2022-07-18 MED ORDER — HEPARIN SOD (PORK) LOCK FLUSH 100 UNIT/ML IV SOLN
500.0000 [IU] | Freq: Once | INTRAVENOUS | Status: AC | PRN
  Filled 2022-07-18: qty 5

## 2022-07-18 MED ORDER — LORAZEPAM 0.5 MG PO TABS: 0.5000 mg | ORAL_TABLET | Freq: Three times a day (TID) | ORAL | 0 refills | Status: DC

## 2022-07-18 MED ORDER — SODIUM CHLORIDE 0.9 % IV SOLN
80.0000 mg/m2 | Freq: Once | INTRAVENOUS | Status: AC
  Administered 2022-07-18: 120 mg via INTRAVENOUS
  Filled 2022-07-18: qty 20

## 2022-07-18 MED ORDER — ACETAMINOPHEN 325 MG PO TABS
650.0000 mg | ORAL_TABLET | Freq: Once | ORAL | Status: AC
  Administered 2022-07-18: 650 mg via ORAL
  Filled 2022-07-18: qty 2

## 2022-07-18 NOTE — Patient Instructions (Addendum)
[place] [facility]   Discharge Instructions: Thank you for choosing [facility] to provide your oncology and hematology care.  If you have a lab appointment with the Cancer Center, please go directly to the Cancer Center and check in at the registration area.  Wear comfortable clothing and clothing appropriate for easy access to any Portacath or PICC line.   We strive to give you quality time with your provider. You may need to reschedule your appointment if you arrive late (15 or more minutes).  Arriving late affects you and other patients whose appointments are after yours.  Also, if you miss three or more appointments without notifying the office, you may be dismissed from the clinic at the provider's discretion.      For prescription refill requests, have your pharmacy contact our office and allow 72 hours for refills to be completed.    Today you received the following chemotherapy and/or immunotherapy agents Taxol, Trastuzumab      To help prevent nausea and vomiting after your treatment, we encourage you to take your nausea medication as directed.  BELOW ARE SYMPTOMS THAT SHOULD BE REPORTED IMMEDIATELY: *FEVER GREATER THAN 100.4 F (38 C) OR HIGHER *CHILLS OR SWEATING *NAUSEA AND VOMITING THAT IS NOT CONTROLLED WITH YOUR NAUSEA MEDICATION *UNUSUAL SHORTNESS OF BREATH *UNUSUAL BRUISING OR BLEEDING *URINARY PROBLEMS (pain or burning when urinating, or frequent urination) *BOWEL PROBLEMS (unusual diarrhea, constipation, pain near the anus) TENDERNESS IN MOUTH AND THROAT WITH OR WITHOUT PRESENCE OF ULCERS (sore throat, sores in mouth, or a toothache) UNUSUAL RASH, SWELLING OR PAIN  UNUSUAL VAGINAL DISCHARGE OR ITCHING   Items with * indicate a potential emergency and should be followed up as soon as possible or go to the Emergency Department if any problems should occur.  Please show the CHEMOTHERAPY ALERT CARD or IMMUNOTHERAPY ALERT CARD at  check-in to the Emergency Department and triage nurse.  Should you have questions after your visit or need to cancel or reschedule your appointment, please contact [place] [facility]  [phone] and follow the prompts.  Office hours are 8:00 a.m. to 4:30 p.m. Monday - Friday. Please note that voicemails left after 4:00 p.m. may not be returned until the following business day.  We are closed weekends and major holidays. You have access to a nurse at all times for urgent questions. Please call the main number to the clinic [phone] and follow the prompts.  For any non-urgent questions, you may also contact your provider using MyChart. We now offer e-Visits for anyone 57 and older to request care online for non-urgent symptoms. For details visit [email or web address].   Also download the MyChart app! Go to the app store, search "MyChart", open the app, select [facility], and log in with your MyChart username and password.  Paclitaxel Injection What is this medication? PACLITAXEL (PAK li TAX el) treats some types of cancer. It works by slowing down the growth of cancer cells. This medicine may be used for other purposes; ask your health care provider or pharmacist if you have questions. COMMON BRAND NAME(S): Onxol, Taxol What should I tell my care team before I take this medication? They need to know if you have any of these conditions: Heart disease Liver disease Low white blood cell levels An unusual or allergic reaction to paclitaxel, other medications, foods, dyes, or preservatives If you or your partner are pregnant or trying to get pregnant Breast-feeding How should I use  this medication? This medication is injected into a vein. It is given by your care team in a hospital or clinic setting. Talk to your care team about the use of this medication in children. While it may be given to children for selected conditions, precautions do apply. Overdosage: If  you think you have taken too much of this medicine contact a poison control center or emergency room at once. NOTE: This medicine is only for you. Do not share this medicine with others. What if I miss a dose? Keep appointments for follow-up doses. It is important not to miss your dose. Call your care team if you are unable to keep an appointment. What may interact with this medication? Do not take this medication with any of the following: Live virus vaccines Other medications may affect the way this medication works. Talk with your care team about all of the medications you take. They may suggest changes to your treatment plan to lower the risk of side effects and to make sure your medications work as intended. This list may not describe all possible interactions. Give your health care provider a list of all the medicines, herbs, non-prescription drugs, or dietary supplements you use. Also tell them if you smoke, drink alcohol, or use illegal drugs. Some items may interact with your medicine. What should I watch for while using this medication? Your condition will be monitored carefully while you are receiving this medication. You may need blood work while taking this medication. This medication may make you feel generally unwell. This is not uncommon as chemotherapy can affect healthy cells as well as cancer cells. Report any side effects. Continue your course of treatment even though you feel ill unless your care team tells you to stop. This medication can cause serious allergic reactions. To reduce the risk, your care team may give you other medications to take before receiving this one. Be sure to follow the directions from your care team. This medication may increase your risk of getting an infection. Call your care team for advice if you get a fever, chills, sore throat, or other symptoms of a cold or flu. Do not treat yourself. Try to avoid being around people who are sick. This medication may  increase your risk to bruise or bleed. Call your care team if you notice any unusual bleeding. Be careful brushing or flossing your teeth or using a toothpick because you may get an infection or bleed more easily. If you have any dental work done, tell your dentist you are receiving this medication. Talk to your care team if you may be pregnant. Serious birth defects can occur if you take this medication during pregnancy. Talk to your care team before breastfeeding. Changes to your treatment plan may be needed. What side effects may I notice from receiving this medication? Side effects that you should report to your care team as soon as possible: Allergic reactions--skin rash, itching, hives, swelling of the face, lips, tongue, or throat Heart rhythm changes--fast or irregular heartbeat, dizziness, feeling faint or lightheaded, chest pain, trouble breathing Increase in blood pressure Infection--fever, chills, cough, sore throat, wounds that don't heal, pain or trouble when passing urine, general feeling of discomfort or being unwell Low blood pressure--dizziness, feeling faint or lightheaded, blurry vision Low red blood cell level--unusual weakness or fatigue, dizziness, headache, trouble breathing Painful swelling, warmth, or redness of the skin, blisters or sores at the infusion site Pain, tingling, or numbness in the hands or feet Slow heartbeat--dizziness,  feeling faint or lightheaded, confusion, trouble breathing, unusual weakness or fatigue Unusual bruising or bleeding Side effects that usually do not require medical attention (report to your care team if they continue or are bothersome): Diarrhea Hair loss Joint pain Loss of appetite Muscle pain Nausea Vomiting This list may not describe all possible side effects. Call your doctor for medical advice about side effects. You may report side effects to FDA at 1-800-FDA-1088. Where should I keep my medication? This medication is given in  a hospital or clinic. It will not be stored at home. NOTE: This sheet is a summary. It may not cover all possible information. If you have questions about this medicine, talk to your doctor, pharmacist, or health care provider.  2023 Elsevier/Gold Standard (2021-07-29 00:00:00)  Trastuzumab Injection What is this medication? TRASTUZUMAB (tras TOO zoo mab) treats breast cancer and stomach cancer. It works by blocking a protein that causes cancer cells to grow and multiply. This helps to slow or stop the spread of cancer cells. This medicine may be used for other purposes; ask your health care provider or pharmacist if you have questions. COMMON BRAND NAME(S): Herceptin, Marlowe Alt, Ontruzant, Trazimera What should I tell my care team before I take this medication? They need to know if you have any of these conditions: Heart failure Lung disease An unusual or allergic reaction to trastuzumab, other medications, foods, dyes, or preservatives Pregnant or trying to get pregnant Breast-feeding How should I use this medication? This medication is injected into a vein. It is given by your care team in a hospital or clinic setting. Talk to your care team about the use of this medication in children. It is not approved for use in children. Overdosage: If you think you have taken too much of this medicine contact a poison control center or emergency room at once. NOTE: This medicine is only for you. Do not share this medicine with others. What if I miss a dose? Keep appointments for follow-up doses. It is important not to miss your dose. Call your care team if you are unable to keep an appointment. What may interact with this medication? Certain types of chemotherapy, such as daunorubicin, doxorubicin, epirubicin, idarubicin This list may not describe all possible interactions. Give your health care provider a list of all the medicines, herbs, non-prescription drugs, or dietary  supplements you use. Also tell them if you smoke, drink alcohol, or use illegal drugs. Some items may interact with your medicine. What should I watch for while using this medication? Your condition will be monitored carefully while you are receiving this medication. This medication may make you feel generally unwell. This is not uncommon, as chemotherapy affects healthy cells as well as cancer cells. Report any side effects. Continue your course of treatment even though you feel ill unless your care team tells you to stop. This medication may increase your risk of getting an infection. Call your care team for advice if you get a fever, chills, sore throat, or other symptoms of a cold or flu. Do not treat yourself. Try to avoid being around people who are sick. Avoid taking medications that contain aspirin, acetaminophen, ibuprofen, naproxen, or ketoprofen unless instructed by your care team. These medications can hide a fever. Talk to your care team if you may be pregnant. Serious birth defects can occur if you take this medication during pregnancy and for 7 months after the last dose. You will need a negative pregnancy test before starting this  medication. Contraception is recommended while taking this medication and for 7 months after the last dose. Your care team can help you find the option that works for you. Do not breastfeed while taking this medication and for 7 months after stopping treatment. What side effects may I notice from receiving this medication? Side effects that you should report to your care team as soon as possible: Allergic reactions or angioedema--skin rash, itching or hives, swelling of the face, eyes, lips, tongue, arms, or legs, trouble swallowing or breathing Dry cough, shortness of breath or trouble breathing Heart failure--shortness of breath, swelling of the ankles, feet, or hands, sudden weight gain, unusual weakness or fatigue Infection--fever, chills, cough, or sore  throat Infusion reactions--chest pain, shortness of breath or trouble breathing, feeling faint or lightheaded Side effects that usually do not require medical attention (report to your care team if they continue or are bothersome): Diarrhea Dizziness Headache Nausea Trouble sleeping Vomiting This list may not describe all possible side effects. Call your doctor for medical advice about side effects. You may report side effects to FDA at 1-800-FDA-1088. Where should I keep my medication? This medication is given in a hospital or clinic. It will not be stored at home. NOTE: This sheet is a summary. It may not cover all possible information. If you have questions about this medicine, talk to your doctor, pharmacist, or health care provider.  2023 Elsevier/Gold Standard (2021-07-15 00:00:00)

## 2022-07-18 NOTE — Progress Notes (Signed)
Patient has no concerns 

## 2022-07-18 NOTE — Progress Notes (Signed)
[facility]enn[facility] CONSULT NOTE  Patient Care Team: Lorre Munroe, NP as PCP - General (Internal Medicine) Hulen Luster, RN as Oncology Nurse Navigator  REFERRING PROVIDER: Nicki Reaper  REASON FOR REFFERAL: breast cancer  CANCER STAGING   Cancer Staging  Breast cancer Staging form: Breast, AJCC 8th Edition - Clinical stage from 05/17/2022: Stage IA (cT1b, cN0, cM0, G3, ER-, PR-, HER2+) - Signed by Michaelyn Barter, MD on 05/27/2022 Stage prefix: Initial diagnosis Histologic grading system: 3 grade system   ASSESSMENT & PLAN:  Jennifer Garrison 62 y.o. female with pmh of osteoporosis and anxiety was referred to medical oncology for management of left breast stage Ia invasive ductal cancer.  # Left breast IDC stage IA, ER/PR negative, HER2 positive -Patient felt a palpable mass in her left breast.   - S/p left breast lumpectomy with SLNB by Dr. Tonna Boehringer on 06/09/2022. Pathology showed 1.4 cm IDC, grade 3, DCIS present grade 2-3 with comedonecrosis, 0/3 lymph nodes negative, margins negative, pT1c.  ER/PR negative.  HER2 positive  - Echocardiogram done on 07/14/2022 showed EF of 50 to 55%.  No regional wall motion abnormalities.  -Patient seen today prior to starting cycle 1 of weekly paclitaxel 80 mg/m and IV trastuzumab loading dose of 4 mg/kg based on APT regimen.  Labs reviewed and acceptable for treatment.  She has Zofran and Compazine as needed.  If nausea and vomiting is not controlled, will consider adding Decadron.  Cryotherapy to reduce the risk of peripheral neuropathy with Taxol.  Due to her prior job, she feels numbness on exposure to cold.  She does not have any tingling or numbness at baseline.  Will closely monitor.  I also discussed about possibility of requiring G-CSF support down the line.  # Anxiety -Worsened with the need of chemotherapy. -Continue with Ativan 0.5 mg every 8 hours as needed.  On Wellbutrin.  # Evaluated by genetics-testing negative  # Access-port.   Placed by Dr. Tonna Boehringer on 06/30/2022.  # Osteoporosis -On alendronate  No orders of the defined types were placed in this encounter.  RTC in 1 week for APP visit, labs, cycle 2 Taxol and Herceptin RTC in 2 weeks for MD visit, labs, cycle 3  The total time spent in the appointment was 30 minutes encounter with patients including review of chart and various tests results, discussions about plan of care and coordination of care plan   All questions were answered. The patient knows to call the clinic with any problems, questions or concerns. No barriers to learning was detected.  Michaelyn Barter, MD 4/22/202410:14 AM   HISTORY OF PRESENTING ILLNESS:  Jennifer Garrison 62 y.o. female with pmh of osteoporosis on alendronate and anxiety was referred to medical oncology for further management of stage Ia left breast invasive ductal cancer HER2 positive  Patient felt a palpable mass in her left breast mid February 2024.  She sought medical attention from PCP.  Further imaging as below.  INTERVAL HISTORY- Patient was seen today prior to starting cycle 1 of Taxol and Herceptin today.  She is doing well with Ativan 0.5 mg.  She has been using it twice a day.  She feels motivated to start the treatment today.  Denies any issues with port.  She has antiemetics as needed.  I have reviewed her chart and materials related to her cancer extensively and collaborated history with the patient. Summary of oncologic history is as follows: Oncology History  Breast cancer  05/11/2022 Mammogram  Diagnostic mammogram and ultrasound- FINDINGS: Full field and spot compression views of the LEFT breast demonstrate a 1 cm irregular mass within the UPPER INNER LEFT breast, at the site of patient concern. The patient has retropectoral implants.   On physical exam, a firm palpable mass identified at the 11 o'clock position of the LEFT breast 8 cm from the nipple.   Targeted ultrasound is performed, showing a 0.9 x 0.8 x  1 cm irregular hypoechoic mass at the 11 o'clock position of the LEFT breast 8 cm from the nipple.   No abnormal LEFT axillary lymph nodes are noted.   IMPRESSION: 1. Suspicious 1 cm UPPER INNER LEFT breast mass. Tissue sampling is recommended. 2. No abnormal appearing LEFT axillary lymph nodes.   05/17/2022 Cancer Staging   Staging form: Breast, AJCC 8th Edition - Clinical stage from 05/17/2022: Stage IA (cT1b, cN0, cM0, G3, ER-, PR-, HER2+) - Signed by Michaelyn Barter, MD on 05/27/2022 Stage prefix: Initial diagnosis Histologic grading system: 3 grade system   05/17/2022 Initial Biopsy   DIAGNOSIS:  A. BREAST, LEFT, 11:00, 8 CM FROM NIPPLE, ULTRASOUND-GUIDED BIOPSY:   - INVASIVE MAMMARY CARCINOMA, NOS DUCTAL.   Size of invasive carcinoma: 10 mm in this sample  Histologic grade of invasive carcinoma: Grade 3                       Glandular/tubular differentiation score: 3                       Nuclear pleomorphism score: 3                       Mitotic rate score: 2                       Total score: 8  Ductal carcinoma in situ: Not identified  Lymphovascular invasion: Not identified   Estrogen Receptor (ER) Status: NEGATIVE (LESS THAN 1%)   Progesterone Receptor (PgR) Status: NEGATIVE (LESS THAN 1%)   HER2 (by immunohistochemistry): POSITIVE (Score 3+)       Percentage of cells with uniform intense complete membrane  staining: 80%  Ki-67: Not performed     Genetic Testing   No pathogenic variants identified on the [facility] Multi-Cancer+RNA panel. VUS in MUTYH called c.1484G>A identified. The report date is 06/09/2022.  The Multi-Cancer + RNA Panel offered by [facility] includes sequencing and/or deletion/duplication analysis of the following 70 genes:  AIP*, ALK, APC*, ATM*, AXIN2*, BAP1*, BARD1*, BLM*, BMPR1A*, BRCA1*, BRCA2*, BRIP1*, CDC73*, CDH1*, CDK4, CDKN1B*, CDKN2A, CHEK2*, CTNNA1*, DICER1*, EPCAM, EGFR, FH*, FLCN*, GREM1, HOXB13, KIT, LZTR1, MAX*, MBD4, MEN1*, MET, MITF,  MLH1*, MSH2*, MSH3*, MSH6*, MUTYH*, NF1*, NF2*, NTHL1*, PALB2*, PDGFRA, PMS2*, POLD1*, POLE*, POT1*, PRKAR1A*, PTCH1*, PTEN*, RAD51C*, RAD51D*, RB1*, RET, SDHA*, SDHAF2*, SDHB*, SDHC*, SDHD*, SMAD4*, SMARCA4*, SMARCB1*, SMARCE1*, STK11*, SUFU*, TMEM127*, TP53*, TSC1*, TSC2*, VHL*. RNA analysis is performed for * genes.   06/09/2022 Surgery   S/p left breast lumpectomy with SLNB by Dr. Tonna Boehringer  Pathology showed 1.4 cm IDC, grade 3, DCIS present grade 2-3 with comedonecrosis, 0/3 lymph nodes negative, margins negative, pT1c.  ER/PR negative.  HER2 positive   07/18/2022 -  Chemotherapy   Patient is on Treatment Plan : BREAST Paclitaxel + Trastuzumab q7d / Trastuzumab q21d      Menarche age 22 or 42 Age at first birth 73 Used birth control pills and Depo more than 5 years Menopause was  age 56 Hysterectomy yes for prolapsed uterus HRT no History of breast biopsies yes for breast cyst in left lower Family history of lung cancer in mother who was smoker  MEDICAL HISTORY:  Past Medical History:  Diagnosis Date   Allergy    Anxiety    Arthritis    Breast cancer 05/27/2022   IBS (irritable bowel syndrome)    Trigger finger    Left Middle Finger 11/2016    SURGICAL HISTORY: Past Surgical History:  Procedure Laterality Date   ABDOMINAL HYSTERECTOMY  02/2016   Only cervix remains (done for prolapse)   AUGMENTATION MAMMAPLASTY Bilateral 2010   BREAST BIOPSY Left 05/17/2022   [place] BX, ribbon clip, path pending   BREAST BIOPSY Left 05/17/2022   [place] LT BREAST BX W LOC DEV 1ST LESION IMG BX SPEC US GUIDE 05/17/2022 [facility]   BREAST CYST ASPIRATION Left 2010   neg   BREAST ENHANCEMENT SURGERY  1998   CHOLECYSTECTOMY     COLONOSCOPY WITH PROPOFOL N/A 10/10/2019   Procedure: COLONOSCOPY WITH PROPOFOL;  Surgeon: Wyline Mood, MD;  Location: [facility];  Service: Gastroenterology;  Laterality: N/A;   PARTIAL MASTECTOMY WITH AXILLARY SENTINEL LYMPH NODE BIOPSY Left 06/09/2022   Procedure:  PARTIAL MASTECTOMY WITH AXILLARY SENTINEL LYMPH NODE BIOPSY;  Surgeon: Sung Amabile, DO;  Location: [facility];  Service: General;  Laterality: Left;   PORTACATH PLACEMENT N/A 06/30/2022   Procedure: INSERTION PORT-A-CATH;  Surgeon: Sung Amabile, DO;  Location: [facility];  Service: General;  Laterality: N/A;   TYMPANOPLASTY Left     SOCIAL HISTORY: Social History   Socioeconomic History   Marital status: Married    Spouse name: Onalee Hua   Number of children: 1   Years of education: Not on file   Highest education level: Not on file  Occupational History   Not on file  Tobacco Use   Smoking status: Former    Types: Cigarettes    Quit date: 09/09/1992    Years since quitting: 29.8   Smokeless tobacco: Current    Types: Chew   Tobacco comments:    Chewing tobacco   Vaping Use   Vaping Use: Former  Substance and Sexual Activity   Alcohol use: Not Currently   Drug use: Yes    Types: Marijuana    Comment: daily   Sexual activity: Yes    Birth control/protection: None  Other Topics Concern   Not on file  Social History Narrative   Not on file   Social Determinants of Health   Financial Resource Strain: Not on file  Food Insecurity: No Food Insecurity (05/27/2022)   Hunger Vital Sign    Worried About Running Out of Food in the Last Year: Never true    Ran Out of Food in the Last Year: Never true  Transportation Needs: No Transportation Needs (05/27/2022)   PRAPARE - Administrator, Civil Service (Medical): No    Lack of Transportation (Non-Medical): No  Physical Activity: Not on file  Stress: Not on file  Social Connections: Not on file  Intimate Partner Violence: Not At Risk (05/27/2022)   Humiliation, Afraid, Rape, and Kick questionnaire    Fear of Current or Ex-Partner: No    Emotionally Abused: No    Physically Abused: No    Sexually Abused: No    FAMILY HISTORY: Family History  Problem Relation Age of Onset   Lung cancer Mother 69   Hypertension Mother     Lupus Father  Leukemia Cousin 6   Breast cancer Neg Hx     ALLERGIES:  is allergic to poison ivy extract.  MEDICATIONS:  Current Outpatient Medications  Medication Sig Dispense Refill   ADVANCED FIBER COMPLEX PO Take 3 tablets by mouth daily. Gummies     alendronate (FOSAMAX) 70 MG tablet Take 70 mg by mouth once a week.     buPROPion (WELLBUTRIN SR) 150 MG 12 hr tablet TAKE 1 TABLET TWICE A DAY 180 tablet 0   busPIRone (BUSPAR) 5 MG tablet Take 1 tablet (5 mg total) by mouth 2 (two) times daily as needed. 60 tablet 2   HYDROcodone-acetaminophen (NORCO) 5-325 MG tablet Take 1 tablet by mouth every 6 (six) hours as needed for up to 6 doses for moderate pain. 6 tablet 0   ibuprofen (ADVIL) 600 MG tablet Take 1 tablet (600 mg total) by mouth every 6 (six) hours as needed. 30 tablet 0   lidocaine-prilocaine (EMLA) cream Apply to affected area once 30 g 3   metroNIDAZOLE (METROCREAM) 0.75 % cream Apply topically as needed. (Patient taking differently: Apply 1 Application topically as needed.)     ondansetron (ZOFRAN) 8 MG tablet Take 1 tablet (8 mg total) by mouth every 8 (eight) hours as needed for nausea or vomiting. 30 tablet 1   OVER THE COUNTER MEDICATION 1.5 tablets in the morning and at bedtime. Calcium Bone Strength Supplement1     OVER THE COUNTER MEDICATION 2 tablets daily. Vitex Berry     OVER THE COUNTER MEDICATION in the morning, at noon, and at bedtime. Milk Thisle Seeds Tea     prochlorperazine (COMPAZINE) 10 MG tablet Take 1 tablet (10 mg total) by mouth every 6 (six) hours as needed for nausea or vomiting. 30 tablet 1   triamcinolone cream (KENALOG) 0.1 % Apply 1 Application topically as needed.     LORazepam (ATIVAN) 0.5 MG tablet Take 1 tablet (0.5 mg total) by mouth every 8 (eight) hours. 30 tablet 0   No current facility-administered medications for this visit.    REVIEW OF SYSTEMS:   Pertinent information mentioned in HPI All other systems were reviewed with the  patient and are negative.  PHYSICAL EXAMINATION: ECOG PERFORMANCE STATUS: 0 - Asymptomatic  Vitals:   07/18/22 0929  BP: 130/67  Pulse: (!) 57  Resp: 20  Temp: 97.6 F (36.4 C)  SpO2: 100%     Filed Weights   07/18/22 0929  Weight: 113 lb 9.6 oz (51.5 kg)     GENERAL:alert, no distress and comfortable SKIN: skin color, texture, turgor are normal, no rashes or significant lesions EYES: normal, conjunctiva are pink and non-injected, sclera clear OROPHARYNX:no exudate, no erythema and lips, buccal mucosa, and tongue normal  NECK: supple, thyroid normal size, non-tender, without nodularity LYMPH:  no palpable lymphadenopathy in the cervical, axillary or inguinal LUNGS: clear to auscultation and percussion with normal breathing effort HEART: regular rate & rhythm and no murmurs and no lower extremity edema ABDOMEN:abdomen soft, non-tender and normal bowel sounds Musculoskeletal:no cyanosis of digits and no clubbing  PSYCH: alert & oriented x 3 with fluent speech NEURO: no focal motor/sensory deficits  LABORATORY DATA:  I have reviewed the data as listed Lab Results  Component Value Date   WBC 7.8 07/18/2022   HGB 13.2 07/18/2022   HCT 40.1 07/18/2022   MCV 88.7 07/18/2022   PLT 220 07/18/2022   Recent Labs    06/17/22 1608 07/18/22 0922  NA 139 137  K 3.8  3.8  CL 104 105  CO2 26 24  GLUCOSE 90 130*  BUN 18 20  CREATININE 0.74 0.83  CALCIUM 9.4 8.8*  GFRNONAA >60 >60  PROT 7.0 6.5  ALBUMIN 4.1 3.9  AST 15 19  ALT 13 14  ALKPHOS 48 46  BILITOT 0.8 0.5    RADIOGRAPHIC STUDIES: I have personally reviewed the radiological images as listed and agreed with the findings in the report. ECHOCARDIOGRAM COMPLETE  Result Date: 07/14/2022    ECHOCARDIOGRAM REPORT   Patient Name:   SWAN FAIRFAX Date of Exam: 07/14/2022 Medical Rec #:  [id number]    Height:       65.0 in Accession #:    [id number]   Weight:       114.2 lb Date of Birth:  1960-04-16     BSA:          1.559  m Patient Age:    61 years     BP:           143/73 mmHg Patient Gender: F            HR:           54 bpm. Exam Location:  [facility] Procedure: 2D Echo, Cardiac Doppler, Color Doppler, Strain Analysis and 3D Echo Indications:     Chemo  History:         Patient has no prior history of Echocardiogram examinations.                  Breast CA.  Sonographer:     Mikki Harbor Referring Phys:  [id number] Michaelyn Barter Diagnosing Phys: Julien Nordmann MD  Sonographer Comments: Image acquisition challenging due to breast implants. Global longitudinal strain was attempted. IMPRESSIONS  1. Left ventricular ejection fraction, by estimation, is 50 to 55%. The left ventricle has low normal function. The left ventricle has no regional wall motion abnormalities. Left ventricular diastolic parameters were normal. The average left ventricular  global longitudinal strain is -16.7 %. The global longitudinal strain is normal.  2. Right ventricular systolic function is normal. The right ventricular size is normal. There is normal pulmonary artery systolic pressure. The estimated right ventricular systolic pressure is 18.7 mmHg.  3. The mitral valve is normal in structure. Mild mitral valve regurgitation. No evidence of mitral stenosis.  4. Tricuspid valve regurgitation is mild to moderate.  5. The aortic valve is tricuspid. Aortic valve regurgitation is trivial. No aortic stenosis is present.  6. There is borderline dilatation of the aortic root, measuring 36 mm.  7. The inferior vena cava is normal in size with greater than 50% respiratory variability, suggesting right atrial pressure of 3 mmHg. FINDINGS  Left Ventricle: Left ventricular ejection fraction, by estimation, is 50 to 55%. The left ventricle has low normal function. The left ventricle has no regional wall motion abnormalities. The average left ventricular global longitudinal strain is -16.7 %. The global longitudinal strain is normal. The left ventricular internal cavity size  was normal in size. There is no left ventricular hypertrophy. Left ventricular diastolic parameters were normal. Right Ventricle: The right ventricular size is normal. No increase in right ventricular wall thickness. Right ventricular systolic function is normal. There is normal pulmonary artery systolic pressure. The tricuspid regurgitant velocity is 1.98 m/s, and  with an assumed right atrial pressure of 3 mmHg, the estimated right ventricular systolic pressure is 18.7 mmHg. Left Atrium: Left atrial size was normal in size. Right Atrium: Right atrial size was normal  in size. Pericardium: There is no evidence of pericardial effusion. Mitral Valve: The mitral valve is normal in structure. Mild mitral valve regurgitation. No evidence of mitral valve stenosis. MV peak gradient, 2.3 mmHg. The mean mitral valve gradient is 1.0 mmHg. Tricuspid Valve: The tricuspid valve is normal in structure. Tricuspid valve regurgitation is mild to moderate. No evidence of tricuspid stenosis. Aortic Valve: The aortic valve is tricuspid. Aortic valve regurgitation is trivial. No aortic stenosis is present. Aortic valve mean gradient measures 2.0 mmHg. Aortic valve peak gradient measures 3.9 mmHg. Aortic valve area, by VTI measures 2.85 cm. Pulmonic Valve: The pulmonic valve was normal in structure. Pulmonic valve regurgitation is mild. No evidence of pulmonic stenosis. Aorta: The aortic root is normal in size and structure. There is borderline dilatation of the aortic root, measuring 36 mm. Venous: The inferior vena cava is normal in size with greater than 50% respiratory variability, suggesting right atrial pressure of 3 mmHg. IAS/Shunts: No atrial level shunt detected by color flow Doppler.  LEFT VENTRICLE PLAX 2D LVIDd:         4.40 cm     Diastology LVIDs:         3.00 cm     LV e' medial:    12.70 cm/s LV PW:         0.80 cm     LV E/e' medial:  6.6 LV IVS:        0.80 cm     LV e' lateral:   14.80 cm/s LVOT diam:     2.00 cm      LV E/e' lateral: 5.6 LV SV:         66 LV SV Index:   42          2D Longitudinal Strain LVOT Area:     3.14 cm    2D Strain GLS Avg:     -16.7 %  LV Volumes (MOD) LV vol d, MOD A2C: 58.9 ml 3D Volume EF: LV vol d, MOD A4C: 64.3 ml 3D EF:        55 % LV vol s, MOD A2C: 27.2 ml LV EDV:       121 ml LV vol s, MOD A4C: 25.3 ml LV ESV:       55 ml LV SV MOD A2C:     31.7 ml LV SV:        67 ml LV SV MOD A4C:     64.3 ml LV SV MOD BP:      36.6 ml RIGHT VENTRICLE RV Basal diam:  3.35 cm RV Mid diam:    2.90 cm RV S prime:     11.30 cm/s TAPSE (M-mode): 2.1 cm LEFT ATRIUM             Index        RIGHT ATRIUM           Index LA diam:        3.30 cm 2.12 cm/m   RA Area:     17.80 cm LA Vol (A2C):   41.1 ml 26.36 ml/m  RA Volume:   49.20 ml  31.56 ml/m LA Vol (A4C):   37.1 ml 23.80 ml/m LA Biplane Vol: 41.9 ml 26.88 ml/m  AORTIC VALVE                    PULMONIC VALVE AV Area (Vmax):    2.92 cm     PV Vmax:  0.72 m/s AV Area (Vmean):   2.81 cm     PV Peak grad:  2.1 mmHg AV Area (VTI):     2.85 cm AV Vmax:           98.70 cm/s AV Vmean:          61.500 cm/s AV VTI:            0.230 m AV Peak Grad:      3.9 mmHg AV Mean Grad:      2.0 mmHg LVOT Vmax:         91.80 cm/s LVOT Vmean:        55.100 cm/s LVOT VTI:          0.209 m LVOT/AV VTI ratio: 0.91  AORTA Ao Root diam: 3.60 cm MITRAL VALVE               TRICUSPID VALVE MV Area (PHT): 3.48 cm    TR Peak grad:   15.7 mmHg MV Area VTI:   2.26 cm    TR Vmax:        198.00 cm/s MV Peak grad:  2.3 mmHg MV Mean grad:  1.0 mmHg    SHUNTS MV Vmax:       0.76 m/s    Systemic VTI:  0.21 m MV Vmean:      35.2 cm/s   Systemic Diam: 2.00 cm MV Decel Time: 218 msec MV E velocity: 83.30 cm/s MV A velocity: 45.10 cm/s MV E/A ratio:  1.85 Julien Nordmann MD Electronically signed by Julien Nordmann MD Signature Date/Time: 07/14/2022/12:06:22 PM    Final    DG Chest 1 View  Result Date: 06/30/2022 CLINICAL DATA:  Port-A-Cath placement EXAM: CHEST  1 VIEW COMPARISON:   01/30/2018 FINDINGS: Right-sided Port-A-Cath terminates at the high to mid right atrium. Numerous leads and wires project over the chest. Midline trachea. Borderline cardiomegaly. Mediastinal contours otherwise within normal limits. No pleural effusion or pneumothorax. Mild pulmonary interstitial thickening may be secondary to remote smoking. No lobar consolidation. Suspect mild retrocardiac left lower lobe scarring. Calcification of 9 mm projects over the left breast. Probable left breast implant. IMPRESSION: Right Port-A-Cath terminates at the high to mid right atrium, without pneumothorax or other acute complication. Electronically Signed   By: Jeronimo Greaves M.D.   On: 06/30/2022 11:35   DG C-Arm 1-60 Min-No Report  Result Date: 06/30/2022 Fluoroscopy was utilized by the requesting physician.  No radiographic interpretation.

## 2022-07-19 ENCOUNTER — Other Ambulatory Visit: Payer: 59

## 2022-07-19 ENCOUNTER — Ambulatory Visit: Payer: 59

## 2022-07-19 ENCOUNTER — Encounter: Payer: 59 | Admitting: Internal Medicine

## 2022-07-19 ENCOUNTER — Telehealth: Payer: Self-pay

## 2022-07-19 ENCOUNTER — Ambulatory Visit: Payer: 59 | Admitting: Internal Medicine

## 2022-07-19 ENCOUNTER — Inpatient Hospital Stay: Payer: 59

## 2022-07-19 NOTE — Telephone Encounter (Signed)
Patient called last night stating that her port was a mildly "discolored" port with mild tenderness. Patient felt this was related to having the port accessed for the first time yesterday but was not sure.   Medical assistant called patient and patient states Port is now lighter and feels fine and normal. Patient states it is darker but she is not concerned because she expects it to be changing with it being "Used" for the first time yesterday. Patient does not want to be seen in clinic today but if it changes she was made aware to give [place] a call so she can be seen. Patient states she will upload a picture of the way it looked last night and the way it looks today on mychart. Patient states she is feeling great after her first chemo. She states she is just a little tired but she expects that.

## 2022-07-19 NOTE — Progress Notes (Signed)
Nutrition Assessment   Reason for Assessment:  New Chemotherapy   ASSESSMENT:  62 year old female with stage 1a left breast cancer.  Past medical history of osteoporosis, IBS, anxiety.  Patient receiving taxol and kanjinti (first infusion on 4/22)  Spoke with patient via phone for nutrition assessment.  Patient reports that she is feeling good today following chemotherapy.  Yesterday was able to eat oatmeal prior to treatment, oatmeal later in the day, small amount of chicken and grilled cheese and tomato soup for dinner.  Has been drinking water with mint leaves and ginger.  Has had any side effects from treatment at this time.  Reports prior to starting treatment decreased intake due to nerves.  Concerned about protein and calcium.     Medications: zofran, compazine   Labs: glucose 130, calcium 8.8, albumin 3.9   Anthropometrics:   Height: 64 inches per patient Weight: 113 lb 9.6 oz  118 lb on 06/28/2021 BMI: 18 (underweight) IBW: 120 lb  6% weight loss in the last year.    Estimated Energy Needs  Kcals: 1500-1700 Protein: 75-85 g protein Fluid: 1500-1700 ml   INTERVENTION:  Discussed importance of good nutrition and weight maintenance during treatment.   Reviewed foods that contain protein and protein goal per meal/day. Answered questions regarding calcium supplementation. Recommend calcium carbonate /day from supplement that has been reviewed by [facility]. Contact information provided   MONITORING, EVALUATION, GOAL: weight trends, intake   Next Visit: Tuesday, May 14 phone call  Jerami Tammen B. Freida Busman, RD, LDN Registered Dietitian [phone]

## 2022-07-20 ENCOUNTER — Other Ambulatory Visit: Payer: Self-pay

## 2022-07-22 MED FILL — Dexamethasone Sodium Phosphate Inj 100 MG/10ML: INTRAMUSCULAR | Qty: 1 | Status: AC

## 2022-07-25 ENCOUNTER — Encounter: Payer: Self-pay | Admitting: Nurse Practitioner

## 2022-07-25 ENCOUNTER — Inpatient Hospital Stay: Payer: 59

## 2022-07-25 ENCOUNTER — Other Ambulatory Visit: Payer: Self-pay

## 2022-07-25 ENCOUNTER — Inpatient Hospital Stay (HOSPITAL_BASED_OUTPATIENT_CLINIC_OR_DEPARTMENT_OTHER): Payer: 59 | Admitting: Nurse Practitioner

## 2022-07-25 ENCOUNTER — Telehealth: Payer: Self-pay | Admitting: *Deleted

## 2022-07-25 VITALS — BP 116/50 | HR 75 | Temp 97.6°F | Wt 117.0 lb

## 2022-07-25 DIAGNOSIS — Z171 Estrogen receptor negative status [ER-]: Secondary | ICD-10-CM

## 2022-07-25 DIAGNOSIS — C50912 Malignant neoplasm of unspecified site of left female breast: Secondary | ICD-10-CM

## 2022-07-25 DIAGNOSIS — Z5111 Encounter for antineoplastic chemotherapy: Secondary | ICD-10-CM

## 2022-07-25 DIAGNOSIS — Z5112 Encounter for antineoplastic immunotherapy: Secondary | ICD-10-CM

## 2022-07-25 DIAGNOSIS — D0512 Intraductal carcinoma in situ of left breast: Secondary | ICD-10-CM | POA: Diagnosis not present

## 2022-07-25 LAB — CBC WITH DIFFERENTIAL (CANCER CENTER ONLY)
Abs Immature Granulocytes: 0.02 10*3/uL (ref 0.00–0.07)
Basophils Absolute: 0 10*3/uL (ref 0.0–0.1)
Basophils Relative: 1 %
Eosinophils Absolute: 0.3 10*3/uL (ref 0.0–0.5)
Eosinophils Relative: 7 %
HCT: 37 % (ref 36.0–46.0)
Hemoglobin: 11.9 g/dL — ABNORMAL LOW (ref 12.0–15.0)
Immature Granulocytes: 1 %
Lymphocytes Relative: 29 %
Lymphs Abs: 1.3 10*3/uL (ref 0.7–4.0)
MCH: 28.9 pg (ref 26.0–34.0)
MCHC: 32.2 g/dL (ref 30.0–36.0)
MCV: 89.8 fL (ref 80.0–100.0)
Monocytes Absolute: 0.2 10*3/uL (ref 0.1–1.0)
Monocytes Relative: 5 %
Neutro Abs: 2.6 10*3/uL (ref 1.7–7.7)
Neutrophils Relative %: 57 %
Platelet Count: 212 10*3/uL (ref 150–400)
RBC: 4.12 MIL/uL (ref 3.87–5.11)
RDW: 12.4 % (ref 11.5–15.5)
WBC Count: 4.4 10*3/uL (ref 4.0–10.5)
nRBC: 0 % (ref 0.0–0.2)

## 2022-07-25 LAB — CMP (CANCER CENTER ONLY)
ALT: 13 U/L (ref 0–44)
AST: 19 U/L (ref 15–41)
Albumin: 3.6 g/dL (ref 3.5–5.0)
Alkaline Phosphatase: 51 U/L (ref 38–126)
Anion gap: 6 (ref 5–15)
BUN: 24 mg/dL — ABNORMAL HIGH (ref 8–23)
CO2: 24 mmol/L (ref 22–32)
Calcium: 8.4 mg/dL — ABNORMAL LOW (ref 8.9–10.3)
Chloride: 104 mmol/L (ref 98–111)
Creatinine: 1.03 mg/dL — ABNORMAL HIGH (ref 0.44–1.00)
GFR, Estimated: >60 mL/min (ref >60)
Glucose, Bld: 120 mg/dL — ABNORMAL HIGH (ref 70–99)
Potassium: 3.9 mmol/L (ref 3.5–5.1)
Sodium: 134 mmol/L — ABNORMAL LOW (ref 135–145)
Total Bilirubin: 0.2 mg/dL — ABNORMAL LOW (ref 0.3–1.2)
Total Protein: 6.2 g/dL — ABNORMAL LOW (ref 6.5–8.1)

## 2022-07-25 MED ORDER — TRASTUZUMAB-ANNS CHEMO 150 MG IV SOLR
2.0000 mg/kg | Freq: Once | INTRAVENOUS | Status: AC
  Administered 2022-07-25: 105 mg via INTRAVENOUS
  Filled 2022-07-25: qty 5

## 2022-07-25 MED ORDER — DIPHENHYDRAMINE HCL 50 MG/ML IJ SOLN
50.0000 mg | Freq: Once | INTRAMUSCULAR | Status: AC
  Administered 2022-07-25: 50 mg via INTRAVENOUS
  Filled 2022-07-25: qty 1

## 2022-07-25 MED ORDER — ACETAMINOPHEN 325 MG PO TABS
650.0000 mg | ORAL_TABLET | Freq: Once | ORAL | Status: AC
  Administered 2022-07-25: 650 mg via ORAL
  Filled 2022-07-25: qty 2

## 2022-07-25 MED ORDER — MAGIC MOUTHWASH W/LIDOCAINE: 5.0000 mL | Freq: Four times a day (QID) | ORAL | 1 refills | Status: DC | PRN

## 2022-07-25 MED ORDER — STERILE WATER FOR INJECTION IJ SOLN
5.0000 mL | Freq: Four times a day (QID) | OROMUCOSAL | 3 refills | Status: DC | PRN
  Filled 2022-07-25: qty 480, 12d supply, fill #0
  Filled 2022-08-15: qty 240, 6d supply, fill #1

## 2022-07-25 MED ORDER — HEPARIN SOD (PORK) LOCK FLUSH 100 UNIT/ML IV SOLN
500.0000 [IU] | Freq: Once | INTRAVENOUS | Status: AC | PRN
  Administered 2022-07-25: 500 [IU]
  Filled 2022-07-25: qty 5

## 2022-07-25 MED ORDER — MAGIC MOUTHWASH W/LIDOCAINE: 5.0000 mL | Freq: Four times a day (QID) | ORAL | 3 refills | Status: DC

## 2022-07-25 MED ORDER — SODIUM CHLORIDE 0.9 % IV SOLN
Freq: Once | INTRAVENOUS | Status: AC
  Filled 2022-07-25: qty 250

## 2022-07-25 MED ORDER — SODIUM CHLORIDE 0.9 % IV SOLN
80.0000 mg/m2 | Freq: Once | INTRAVENOUS | Status: AC
  Administered 2022-07-25: 120 mg via INTRAVENOUS
  Filled 2022-07-25: qty 20

## 2022-07-25 MED ORDER — FAMOTIDINE IN NACL 20-0.9 MG/50ML-% IV SOLN
20.0000 mg | Freq: Once | INTRAVENOUS | Status: AC
  Administered 2022-07-25: 20 mg via INTRAVENOUS
  Filled 2022-07-25: qty 50

## 2022-07-25 MED ORDER — SODIUM CHLORIDE 0.9 % IV SOLN
10.0000 mg | Freq: Once | INTRAVENOUS | Status: AC
  Administered 2022-07-25: 10 mg via INTRAVENOUS
  Filled 2022-07-25: qty 10

## 2022-07-25 NOTE — Telephone Encounter (Signed)
Magic mouth wash script Faxed script to [facility]. Fax confirmation rcvd Per zaria, pt unable to get script [facility] as [facility] not [facility]wash. Cherlynn Kaiser will let pt know that script was faxed to [facility]

## 2022-07-25 NOTE — Progress Notes (Signed)
[facility]cer Center CONSULT NOTE  Patient Care Team: Lorre Munroe, NP as PCP - General (Internal Medicine) Hulen Luster, RN as Oncology Nurse Navigator  REFERRING PROVIDER: Nicki Reaper  REASON FOR REFFERAL: breast cancer  CANCER STAGING   Cancer Staging  Breast cancer [facility]) Staging form: Breast, AJCC 8th Edition - Clinical stage from 05/17/2022: Stage IA (cT1b, cN0, cM0, G3, ER-, PR-, HER2+) - Signed by Michaelyn Barter, MD on 05/27/2022 Stage prefix: Initial diagnosis Histologic grading system: 3 grade system   ASSESSMENT & PLAN:  Jennifer Garrison 62 y.o. female with pmh of osteoporosis and anxiety was referred to medical oncology for management of left breast stage Ia invasive ductal cancer.  Left breast Cancer- Invasive ductal carcinoma, stage IA, ER/PR negative, HER2 positive. Patient self palpated mass, underwent left breast lumpectomy with SLNBx with Dr. Tonna Boehringer on 06/09/22. Pathology showed 1.4 cm IDC, grade 3, DCIS present, grade 2-3 with comedonecrosis. 0/3 LN negative. Negative margins. pT1c. She initiated cycle 1 of adjuvant weekly paclitaxel 80 mg/m2 and trastuzumab loading dose of 4 mg/kg based on APT regimen on 07/18/22 with Dr Alena Bills. Labs reviewed and acceptable for treatment. Proceed with D8C1 taxol-herceptin today.  High risk medication use- Echocardiogram done on 07/14/2022 showed EF of 50 to 55%.  No regional wall motion abnormalities.  Peripheral neuropathy- due to her prior job, she has baseline neuropathy with cold exposure. None at baseline otherwise. She plans to continue cryotherapy with chemotherapy/paclitaxel to reduce the risk of CIPN. Monitor.  At risk risk for neutropenia with chemotherapy- WBC has decreased from 7.8 to 4.4, ANC 5.5 --> 2.6. May consider GCSF support in the future but hold for now.  Chemotherapy induced nausea- currently well controlled. Continue zofran and/or compazine as prescribed. If uncontrolled, consider adding decadron.  Mouth pain- no  obvious lesions. Reviewed that symptoms are often self limiting. Encouraged oral hygiene with soft sponge oral/dental hygiene, chlorhexidine rinses after PO intake. Also discussed oral rinses with a weak solution of salt and baking soda (1/2 teaspoon of salt and 1 teaspoon of baking soda and a quart of water) should be performed every 4 hours.  Hydrogen peroxide (diluted 1:1 with water) should only be used for debridement and chronic use may delay healing.  Explained that diet should be limited to foods that do not require significant chewing as this may cause irritation and she should avoid acidic, salty, or dry foods.  Nutritional supplements and shakes were discussed. She would like to see Alphonse Guild, RD for review of supplements again. Will also send prescription for magic mouthwash.  Elevated serum creatinine- secondary to limited intake. Plan for IV fluids today. See Joli (above).  Genetics- negative.  Anxiety- at baseline but worse with chemotherapy and cancer diagnosis. On wellbutrin. Continue ativan 0.5 mg every 8 hours as needed.  Osteoporosis- on alendronate  Port-a-cath: placed by Dr. Tonna Boehringer on 06/30/22. Functioning appropriately. Mild discoloration post treatment which has resolved. Mild tenderness at port site. If increased pain, swelling, or color changes, recommend ultrasound to evaluate for vte.   Disposition:  Proceed with taxol & herceptin today. Add fluids Follow up as scheduled for port/labs, Dr Alena Bills, taxol-herceptin- la  No orders of the defined types were placed in this encounter.   All questions were answered. The patient knows to call the clinic with any problems, questions or concerns. No barriers to learning was detected.  Alinda Dooms, NP 07/25/2022   HISTORY OF PRESENTING ILLNESS:  Jennifer Garrison 62 y.o. female with  pmh of osteoporosis on alendronate and anxiety was referred to medical oncology for further management of stage Ia left breast invasive ductal cancer HER2  positive  Patient felt a palpable mass in her left breast mid February 2024.  She sought medical attention from PCP.  Further imaging as below.  INTERVAL HISTORY- Patient was seen today for consideration of D8C1 of taxol-herceptin. She continues to feel anxious regarding her treatment. Is keeping a diary of her symptoms using the [place] cancer [employer] journal. Has had limited hunger and not eating. Mild nausea which was relieved with zofran. No diarrhea or vomiting.    I have reviewed her chart and materials related to her cancer extensively and collaborated history with the patient. Summary of oncologic history is as follows: Oncology History  Breast cancer (HCC)  05/11/2022 Mammogram   Diagnostic mammogram and ultrasound- FINDINGS: Full field and spot compression views of the LEFT breast demonstrate a 1 cm irregular mass within the UPPER INNER LEFT breast, at the site of patient concern. The patient has retropectoral implants.   On physical exam, a firm palpable mass identified at the 11 o'clock position of the LEFT breast 8 cm from the nipple.   Targeted ultrasound is performed, showing a 0.9 x 0.8 x 1 cm irregular hypoechoic mass at the 11 o'clock position of the LEFT breast 8 cm from the nipple.   No abnormal LEFT axillary lymph nodes are noted.   IMPRESSION: 1. Suspicious 1 cm UPPER INNER LEFT breast mass. Tissue sampling is recommended. 2. No abnormal appearing LEFT axillary lymph nodes.   05/17/2022 Cancer Staging   Staging form: Breast, AJCC 8th Edition - Clinical stage from 05/17/2022: Stage IA (cT1b, cN0, cM0, G3, ER-, PR-, HER2+) - Signed by Michaelyn Barter, MD on 05/27/2022 Stage prefix: Initial diagnosis Histologic grading system: 3 grade system   05/17/2022 Initial Biopsy   DIAGNOSIS:  A. BREAST, LEFT, 11:00, 8 CM FROM NIPPLE, ULTRASOUND-GUIDED BIOPSY:   - INVASIVE MAMMARY CARCINOMA, NOS DUCTAL.   Size of invasive carcinoma: 10 mm in this sample  Histologic grade  of invasive carcinoma: Grade 3                       Glandular/tubular differentiation score: 3                       Nuclear pleomorphism score: 3                       Mitotic rate score: 2                       Total score: 8  Ductal carcinoma in situ: Not identified  Lymphovascular invasion: Not identified   Estrogen Receptor (ER) Status: NEGATIVE (LESS THAN 1%)   Progesterone Receptor (PgR) Status: NEGATIVE (LESS THAN 1%)   HER2 (by immunohistochemistry): POSITIVE (Score 3+)       Percentage of cells with uniform intense complete membrane  staining: 80%  Ki-67: Not performed     Genetic Testing   No pathogenic variants identified on the [facility] Multi-Cancer+RNA panel. VUS in MUTYH called c.1484G>A identified. The report date is 06/09/2022.  The Multi-Cancer + RNA Panel offered by [facility] includes sequencing and/or deletion/duplication analysis of the following 70 genes:  AIP*, ALK, APC*, ATM*, AXIN2*, BAP1*, BARD1*, BLM*, BMPR1A*, BRCA1*, BRCA2*, BRIP1*, CDC73*, CDH1*, CDK4, CDKN1B*, CDKN2A, CHEK2*, CTNNA1*, DICER1*, EPCAM, EGFR, FH*, FLCN*, GREM1, HOXB13, KIT,  LZTR1, MAX*, MBD4, MEN1*, MET, MITF, MLH1*, MSH2*, MSH3*, MSH6*, MUTYH*, NF1*, NF2*, NTHL1*, PALB2*, PDGFRA, PMS2*, POLD1*, POLE*, POT1*, PRKAR1A*, PTCH1*, PTEN*, RAD51C*, RAD51D*, RB1*, RET, SDHA*, SDHAF2*, SDHB*, SDHC*, SDHD*, SMAD4*, SMARCA4*, SMARCB1*, SMARCE1*, STK11*, SUFU*, TMEM127*, TP53*, TSC1*, TSC2*, VHL*. RNA analysis is performed for * genes.   06/09/2022 Surgery   S/p left breast lumpectomy with SLNB by Dr. Tonna Boehringer  Pathology showed 1.4 cm IDC, grade 3, DCIS present grade 2-3 with comedonecrosis, 0/3 lymph nodes negative, margins negative, pT1c.  ER/PR negative.  HER2 positive   07/18/2022 -  Chemotherapy   Patient is on Treatment Plan : BREAST Paclitaxel + Trastuzumab q7d / Trastuzumab q21d      Menarche age 34 or 31 Age at first birth 52 Used birth control pills and Depo more than 5 years Menopause was age  47 Hysterectomy yes for prolapsed uterus HRT no History of breast biopsies yes for breast cyst in left lower Family history of lung cancer in mother who was smoker  MEDICAL HISTORY:  Past Medical History:  Diagnosis Date   Allergy    Anxiety    Arthritis    Breast cancer (HCC) 05/27/2022   IBS (irritable bowel syndrome)    Trigger finger    Left Middle Finger 11/2016    SURGICAL HISTORY: Past Surgical History:  Procedure Laterality Date   ABDOMINAL HYSTERECTOMY  02/2016   Only cervix remains (done for prolapse)   AUGMENTATION MAMMAPLASTY Bilateral 2010   BREAST BIOPSY Left 05/17/2022   [place] BX, ribbon clip, path pending   BREAST BIOPSY Left 05/17/2022   [place] LT BREAST BX W LOC DEV 1ST LESION IMG BX SPEC US GUIDE 05/17/2022 [facility]   BREAST CYST ASPIRATION Left 2010   neg   BREAST ENHANCEMENT SURGERY  1998   CHOLECYSTECTOMY     COLONOSCOPY WITH PROPOFOL N/A 10/10/2019   Procedure: COLONOSCOPY WITH PROPOFOL;  Surgeon: Wyline Mood, MD;  [facility] ENDOSCOPY;  Service: Gastroenterology;  Laterality: N/A;   PARTIAL MASTECTOMY WITH AXILLARY SENTINEL LYMPH NODE BIOPSY Left 06/09/2022   Procedure: PARTIAL MASTECTOMY WITH AXILLARY SENTINEL LYMPH NODE BIOPSY;  Surgeon: Sung Amabile, DO;  Location: [facility] ORS;  Service: General;  Laterality: Left;   PORTACATH PLACEMENT N/A 06/30/2022   Procedure: INSERTION PORT-A-CATH;  Surgeon: Sung Amabile, DO;  Location: [facility] ORS;  Service: General;  Laterality: N/A;   TYMPANOPLASTY Left     SOCIAL HISTORY: Social History   Socioeconomic History   Marital status: Married    Spouse name: Onalee Hua   Number of children: 1   Years of education: Not on file   Highest education level: Not on file  Occupational History   Not on file  Tobacco Use   Smoking status: Former    Types: Cigarettes    Quit date: 09/09/1992    Years since quitting: 29.8   Smokeless tobacco: Current    Types: Chew   Tobacco comments:    Chewing tobacco   Vaping Use    Vaping Use: Former  Substance and Sexual Activity   Alcohol use: Not Currently   Drug use: Yes    Types: Marijuana    Comment: daily   Sexual activity: Yes    Birth control/protection: None  Other Topics Concern   Not on file  Social History Narrative   Not on file   Social Determinants of Health   Financial Resource Strain: Not on file  Food Insecurity: No Food Insecurity (05/27/2022)   Hunger Vital Sign    Worried About  Running Out of Food in the Last Year: Never true    Ran Out of Food in the Last Year: Never true  Transportation Needs: No Transportation Needs (05/27/2022)   PRAPARE - Administrator, Civil Service (Medical): No    Lack of Transportation (Non-Medical): No  Physical Activity: Not on file  Stress: Not on file  Social Connections: Not on file  Intimate Partner Violence: Not At Risk (05/27/2022)   Humiliation, Afraid, Rape, and Kick questionnaire    Fear of Current or Ex-Partner: No    Emotionally Abused: No    Physically Abused: No    Sexually Abused: No    FAMILY HISTORY: Family History  Problem Relation Age of Onset   Lung cancer Mother 54   Hypertension Mother    Lupus Father    Leukemia Cousin 6   Breast cancer Neg Hx     ALLERGIES:  is allergic to poison ivy extract.  MEDICATIONS:  Current Outpatient Medications  Medication Sig Dispense Refill   ADVANCED FIBER COMPLEX PO Take 3 tablets by mouth daily. Gummies     alendronate (FOSAMAX) 70 MG tablet Take 70 mg by mouth once a week.     buPROPion (WELLBUTRIN SR) 150 MG 12 hr tablet TAKE 1 TABLET TWICE A DAY 180 tablet 0   busPIRone (BUSPAR) 5 MG tablet Take 1 tablet (5 mg total) by mouth 2 (two) times daily as needed. 60 tablet 2   HYDROcodone-acetaminophen (NORCO) 5-325 MG tablet Take 1 tablet by mouth every 6 (six) hours as needed for up to 6 doses for moderate pain. 6 tablet 0   ibuprofen (ADVIL) 600 MG tablet Take 1 tablet (600 mg total) by mouth every 6 (six) hours as needed. 30  tablet 0   lidocaine-prilocaine (EMLA) cream Apply to affected area once 30 g 3   LORazepam (ATIVAN) 0.5 MG tablet Take 1 tablet (0.5 mg total) by mouth every 8 (eight) hours. 30 tablet 0   metroNIDAZOLE (METROCREAM) 0.75 % cream Apply topically as needed. (Patient taking differently: Apply 1 Application topically as needed.)     ondansetron (ZOFRAN) 8 MG tablet Take 1 tablet (8 mg total) by mouth every 8 (eight) hours as needed for nausea or vomiting. 30 tablet 1   OVER THE COUNTER MEDICATION 1.5 tablets in the morning and at bedtime. Calcium Bone Strength Supplement1     OVER THE COUNTER MEDICATION 2 tablets daily. Vitex Berry     OVER THE COUNTER MEDICATION in the morning, at noon, and at bedtime. Milk Thisle Seeds Tea     prochlorperazine (COMPAZINE) 10 MG tablet Take 1 tablet (10 mg total) by mouth every 6 (six) hours as needed for nausea or vomiting. 30 tablet 1   triamcinolone cream (KENALOG) 0.1 % Apply 1 Application topically as needed.     No current facility-administered medications for this visit.    REVIEW OF SYSTEMS:   Review of Systems  Constitutional:  Negative for chills, fever, malaise/fatigue and weight loss.  HENT:  Positive for sore throat. Negative for hearing loss, nosebleeds and tinnitus.   Eyes:  Negative for blurred vision and double vision.  Respiratory:  Negative for cough, hemoptysis, shortness of breath and wheezing.   Cardiovascular:  Negative for chest pain, palpitations and leg swelling.  Gastrointestinal:  Negative for abdominal pain, blood in stool, constipation, diarrhea, melena, nausea and vomiting.  Genitourinary:  Negative for dysuria and urgency.  Musculoskeletal:  Negative for back pain, falls, joint pain and myalgias.  Skin:  Negative for itching and rash.  Neurological:  Negative for dizziness, tingling, sensory change, loss of consciousness, weakness and headaches.  Endo/Heme/Allergies:  Negative for environmental allergies. Does not bruise/bleed  easily.  Psychiatric/Behavioral:  Negative for depression. The patient is nervous/anxious. The patient does not have insomnia.      PHYSICAL EXAMINATION: ECOG PERFORMANCE STATUS: 0 - Asymptomatic  Vitals:   07/25/22 0907  BP: (!) 116/50  Pulse: 75  Temp: 97.6 F (36.4 C)  SpO2: 100%   Filed Weights   07/25/22 0907  Weight: 117 lb (53.1 kg)   General: Well-developed, well-nourished, no acute distress. Eyes: Pink conjunctiva, anicteric sclera. Lungs: Clear to auscultation bilaterally.  No audible wheezing or coughing Heart: Regular rate and rhythm.  Abdomen: Soft, nontender, nondistended.  Musculoskeletal: No edema, cyanosis, or clubbing. Neuro: Alert, answering all questions appropriately. Cranial nerves grossly intact. Skin: No rashes or petechiae noted. Psych: anxious affect.    LABORATORY DATA:  I have reviewed the data as listed Lab Results  Component Value Date   WBC 4.4 07/25/2022   HGB 11.9 (L) 07/25/2022   HCT 37.0 07/25/2022   MCV 89.8 07/25/2022   PLT 212 07/25/2022   Recent Labs    06/17/22 1608 07/18/22 0922 07/25/22 0857  NA 139 137 134*  K 3.8 3.8 3.9  CL 104 105 104  CO2 26 24 24   GLUCOSE 90 130* 120*  BUN 18 20 24*  CREATININE 0.74 0.83 1.03*  CALCIUM 9.4 8.8* 8.4*  GFRNONAA >60 >60 >60  PROT 7.0 6.5 6.2*  ALBUMIN 4.1 3.9 3.6  AST 15 19 19   ALT 13 14 13   ALKPHOS 48 46 51  BILITOT 0.8 0.5 0.2*    RADIOGRAPHIC STUDIES: I have personally reviewed the radiological images as listed and agreed with the findings in the report. ECHOCARDIOGRAM COMPLETE  Result Date: 07/14/2022    ECHOCARDIOGRAM REPORT   Patient Name:   REGINNA SERMENO Date of Exam: 07/14/2022 Medical Rec #:  [id number]    Height:       65.0 in Accession #:    [id number]   Weight:       114.2 lb Date of Birth:  [birth year]     BSA:          1.559 m Patient Age:    61 years     BP:           143/73 mmHg Patient Gender: F            HR:           54 bpm. Exam Location:  [facility] Procedure: 2D  Echo, Cardiac Doppler, Color Doppler, Strain Analysis and 3D Echo Indications:     Chemo  History:         Patient has no prior history of Echocardiogram examinations.                  Breast CA.  Sonographer:     Mikki Harbor Referring Phys:  [id number] Michaelyn Barter Diagnosing Phys: Julien Nordmann MD  Sonographer Comments: Image acquisition challenging due to breast implants. Global longitudinal strain was attempted. IMPRESSIONS  1. Left ventricular ejection fraction, by estimation, is 50 to 55%. The left ventricle has low normal function. The left ventricle has no regional wall motion abnormalities. Left ventricular diastolic parameters were normal. The average left ventricular  global longitudinal strain is -16.7 %. The global longitudinal strain is normal.  2. Right ventricular systolic function is normal. The right ventricular  size is normal. There is normal pulmonary artery systolic pressure. The estimated right ventricular systolic pressure is 18.7 mmHg.  3. The mitral valve is normal in structure. Mild mitral valve regurgitation. No evidence of mitral stenosis.  4. Tricuspid valve regurgitation is mild to moderate.  5. The aortic valve is tricuspid. Aortic valve regurgitation is trivial. No aortic stenosis is present.  6. There is borderline dilatation of the aortic root, measuring 36 mm.  7. The inferior vena cava is normal in size with greater than 50% respiratory variability, suggesting right atrial pressure of 3 mmHg. FINDINGS  Left Ventricle: Left ventricular ejection fraction, by estimation, is 50 to 55%. The left ventricle has low normal function. The left ventricle has no regional wall motion abnormalities. The average left ventricular global longitudinal strain is -16.7 %. The global longitudinal strain is normal. The left ventricular internal cavity size was normal in size. There is no left ventricular hypertrophy. Left ventricular diastolic parameters were normal. Right Ventricle: The right  ventricular size is normal. No increase in right ventricular wall thickness. Right ventricular systolic function is normal. There is normal pulmonary artery systolic pressure. The tricuspid regurgitant velocity is 1.98 m/s, and  with an assumed right atrial pressure of 3 mmHg, the estimated right ventricular systolic pressure is 18.7 mmHg. Left Atrium: Left atrial size was normal in size. Right Atrium: Right atrial size was normal in size. Pericardium: There is no evidence of pericardial effusion. Mitral Valve: The mitral valve is normal in structure. Mild mitral valve regurgitation. No evidence of mitral valve stenosis. MV peak gradient, 2.3 mmHg. The mean mitral valve gradient is 1.0 mmHg. Tricuspid Valve: The tricuspid valve is normal in structure. Tricuspid valve regurgitation is mild to moderate. No evidence of tricuspid stenosis. Aortic Valve: The aortic valve is tricuspid. Aortic valve regurgitation is trivial. No aortic stenosis is present. Aortic valve mean gradient measures 2.0 mmHg. Aortic valve peak gradient measures 3.9 mmHg. Aortic valve area, by VTI measures 2.85 cm. Pulmonic Valve: The pulmonic valve was normal in structure. Pulmonic valve regurgitation is mild. No evidence of pulmonic stenosis. Aorta: The aortic root is normal in size and structure. There is borderline dilatation of the aortic root, measuring 36 mm. Venous: The inferior vena cava is normal in size with greater than 50% respiratory variability, suggesting right atrial pressure of 3 mmHg. IAS/Shunts: No atrial level shunt detected by color flow Doppler.  LEFT VENTRICLE PLAX 2D LVIDd:         4.40 cm     Diastology LVIDs:         3.00 cm     LV e' medial:    12.70 cm/s LV PW:         0.80 cm     LV E/e' medial:  6.6 LV IVS:        0.80 cm     LV e' lateral:   14.80 cm/s LVOT diam:     2.00 cm     LV E/e' lateral: 5.6 LV SV:         66 LV SV Index:   42          2D Longitudinal Strain LVOT Area:     3.14 cm    2D Strain GLS Avg:      -16.7 %  LV Volumes (MOD) LV vol d, MOD A2C: 58.9 ml 3D Volume EF: LV vol d, MOD A4C: 64.3 ml 3D EF:        55 % LV  vol s, MOD A2C: 27.2 ml LV EDV:       121 ml LV vol s, MOD A4C: 25.3 ml LV ESV:       55 ml LV SV MOD A2C:     31.7 ml LV SV:        67 ml LV SV MOD A4C:     64.3 ml LV SV MOD BP:      36.6 ml RIGHT VENTRICLE RV Basal diam:  3.35 cm RV Mid diam:    2.90 cm RV S prime:     11.30 cm/s TAPSE (M-mode): 2.1 cm LEFT ATRIUM             Index        RIGHT ATRIUM           Index LA diam:        3.30 cm 2.12 cm/m   RA Area:     17.80 cm LA Vol (A2C):   41.1 ml 26.36 ml/m  RA Volume:   49.20 ml  31.56 ml/m LA Vol (A4C):   37.1 ml 23.80 ml/m LA Biplane Vol: 41.9 ml 26.88 ml/m  AORTIC VALVE                    PULMONIC VALVE AV Area (Vmax):    2.92 cm     PV Vmax:       0.72 m/s AV Area (Vmean):   2.81 cm     PV Peak grad:  2.1 mmHg AV Area (VTI):     2.85 cm AV Vmax:           98.70 cm/s AV Vmean:          61.500 cm/s AV VTI:            0.230 m AV Peak Grad:      3.9 mmHg AV Mean Grad:      2.0 mmHg LVOT Vmax:         91.80 cm/s LVOT Vmean:        55.100 cm/s LVOT VTI:          0.209 m LVOT/AV VTI ratio: 0.91  AORTA Ao Root diam: 3.60 cm MITRAL VALVE               TRICUSPID VALVE MV Area (PHT): 3.48 cm    TR Peak grad:   15.7 mmHg MV Area VTI:   2.26 cm    TR Vmax:        198.00 cm/s MV Peak grad:  2.3 mmHg MV Mean grad:  1.0 mmHg    SHUNTS MV Vmax:       0.76 m/s    Systemic VTI:  0.21 m MV Vmean:      35.2 cm/s   Systemic Diam: 2.00 cm MV Decel Time: 218 msec MV E velocity: 83.30 cm/s MV A velocity: 45.10 cm/s MV E/A ratio:  1.85 Julien Nordmann MD Electronically signed by Julien Nordmann MD Signature Date/Time: 07/14/2022/12:06:22 PM    Final    DG Chest 1 View  Result Date: 06/30/2022 CLINICAL DATA:  Port-A-Cath placement EXAM: CHEST  1 VIEW COMPARISON:  01/30/2018 FINDINGS: Right-sided Port-A-Cath terminates at the high to mid right atrium. Numerous leads and wires project over the chest. Midline  trachea. Borderline cardiomegaly. Mediastinal contours otherwise within normal limits. No pleural effusion or pneumothorax. Mild pulmonary interstitial thickening may be secondary to remote smoking. No lobar consolidation. Suspect mild retrocardiac left lower lobe scarring. Calcification of 9  mm projects over the left breast. Probable left breast implant. IMPRESSION: Right Port-A-Cath terminates at the high to mid right atrium, without pneumothorax or other acute complication. Electronically Signed   By: Jeronimo Greaves M.D.   On: 06/30/2022 11:35   DG C-Arm 1-60 Min-No Report  Result Date: 06/30/2022 Fluoroscopy was utilized by the requesting physician.  No radiographic interpretation.

## 2022-07-26 ENCOUNTER — Other Ambulatory Visit: Payer: Self-pay

## 2022-07-26 ENCOUNTER — Encounter: Payer: Self-pay | Admitting: Internal Medicine

## 2022-07-29 MED FILL — Dexamethasone Sodium Phosphate Inj 100 MG/10ML: INTRAMUSCULAR | Qty: 1 | Status: AC

## 2022-08-01 ENCOUNTER — Encounter: Payer: Self-pay | Admitting: Internal Medicine

## 2022-08-01 ENCOUNTER — Inpatient Hospital Stay: Payer: 59

## 2022-08-01 ENCOUNTER — Inpatient Hospital Stay: Payer: 59 | Attending: Internal Medicine | Admitting: Internal Medicine

## 2022-08-01 VITALS — BP 126/73 | HR 78 | Temp 98.5°F | Wt 114.6 lb

## 2022-08-01 VITALS — BP 96/78 | HR 64 | Temp 97.6°F

## 2022-08-01 DIAGNOSIS — K1231 Oral mucositis (ulcerative) due to antineoplastic therapy: Secondary | ICD-10-CM

## 2022-08-01 DIAGNOSIS — D0512 Intraductal carcinoma in situ of left breast: Secondary | ICD-10-CM | POA: Insufficient documentation

## 2022-08-01 DIAGNOSIS — T451X5A Adverse effect of antineoplastic and immunosuppressive drugs, initial encounter: Secondary | ICD-10-CM

## 2022-08-01 DIAGNOSIS — F419 Anxiety disorder, unspecified: Secondary | ICD-10-CM | POA: Insufficient documentation

## 2022-08-01 DIAGNOSIS — Z5111 Encounter for antineoplastic chemotherapy: Secondary | ICD-10-CM

## 2022-08-01 DIAGNOSIS — Z79899 Other long term (current) drug therapy: Secondary | ICD-10-CM | POA: Insufficient documentation

## 2022-08-01 DIAGNOSIS — K521 Toxic gastroenteritis and colitis: Secondary | ICD-10-CM | POA: Insufficient documentation

## 2022-08-01 DIAGNOSIS — C50912 Malignant neoplasm of unspecified site of left female breast: Secondary | ICD-10-CM | POA: Diagnosis not present

## 2022-08-01 DIAGNOSIS — G620 Drug-induced polyneuropathy: Secondary | ICD-10-CM | POA: Diagnosis not present

## 2022-08-01 DIAGNOSIS — Z171 Estrogen receptor negative status [ER-]: Secondary | ICD-10-CM

## 2022-08-01 DIAGNOSIS — Z5112 Encounter for antineoplastic immunotherapy: Secondary | ICD-10-CM

## 2022-08-01 LAB — CBC WITH DIFFERENTIAL (CANCER CENTER ONLY)
Abs Immature Granulocytes: 0.01 10*3/uL (ref 0.00–0.07)
Basophils Absolute: 0.1 10*3/uL (ref 0.0–0.1)
Basophils Relative: 1 %
Eosinophils Absolute: 0.1 10*3/uL (ref 0.0–0.5)
Eosinophils Relative: 4 %
HCT: 36.6 % (ref 36.0–46.0)
Hemoglobin: 12.1 g/dL (ref 12.0–15.0)
Immature Granulocytes: 0 %
Lymphocytes Relative: 39 %
Lymphs Abs: 1.4 10*3/uL (ref 0.7–4.0)
MCH: 29.4 pg (ref 26.0–34.0)
MCHC: 33.1 g/dL (ref 30.0–36.0)
MCV: 88.8 fL (ref 80.0–100.0)
Monocytes Absolute: 0.2 10*3/uL (ref 0.1–1.0)
Monocytes Relative: 5 %
Neutro Abs: 1.8 10*3/uL (ref 1.7–7.7)
Neutrophils Relative %: 51 %
Platelet Count: 280 10*3/uL (ref 150–400)
RBC: 4.12 MIL/uL (ref 3.87–5.11)
RDW: 12.4 % (ref 11.5–15.5)
WBC Count: 3.5 10*3/uL — ABNORMAL LOW (ref 4.0–10.5)
nRBC: 0 % (ref 0.0–0.2)

## 2022-08-01 LAB — CMP (CANCER CENTER ONLY)
ALT: 24 U/L (ref 0–44)
AST: 22 U/L (ref 15–41)
Albumin: 3.7 g/dL (ref 3.5–5.0)
Alkaline Phosphatase: 52 U/L (ref 38–126)
Anion gap: 8 (ref 5–15)
BUN: 17 mg/dL (ref 8–23)
CO2: 26 mmol/L (ref 22–32)
Calcium: 9.2 mg/dL (ref 8.9–10.3)
Chloride: 103 mmol/L (ref 98–111)
Creatinine: 0.85 mg/dL (ref 0.44–1.00)
GFR, Estimated: >60 mL/min (ref >60)
Glucose, Bld: 130 mg/dL — ABNORMAL HIGH (ref 70–99)
Potassium: 4 mmol/L (ref 3.5–5.1)
Sodium: 137 mmol/L (ref 135–145)
Total Bilirubin: 0.6 mg/dL (ref 0.3–1.2)
Total Protein: 6.6 g/dL (ref 6.5–8.1)

## 2022-08-01 LAB — IRON AND TIBC
Iron: 43 ug/dL (ref 28–170)
Saturation Ratios: 13 % (ref 10.4–31.8)
TIBC: 332 ug/dL (ref 250–450)
UIBC: 289 ug/dL

## 2022-08-01 LAB — FERRITIN: Ferritin: 81 ng/mL (ref 11–307)

## 2022-08-01 MED ORDER — DIPHENHYDRAMINE HCL 50 MG/ML IJ SOLN
50.0000 mg | Freq: Once | INTRAMUSCULAR | Status: AC
  Administered 2022-08-01: 50 mg via INTRAVENOUS
  Filled 2022-08-01: qty 1

## 2022-08-01 MED ORDER — FAMOTIDINE IN NACL 20-0.9 MG/50ML-% IV SOLN
20.0000 mg | Freq: Once | INTRAVENOUS | Status: AC
  Administered 2022-08-01: 20 mg via INTRAVENOUS
  Filled 2022-08-01: qty 50

## 2022-08-01 MED ORDER — HEPARIN SOD (PORK) LOCK FLUSH 100 UNIT/ML IV SOLN
500.0000 [IU] | Freq: Once | INTRAVENOUS | Status: AC | PRN
  Administered 2022-08-01: 500 [IU]
  Filled 2022-08-01: qty 5

## 2022-08-01 MED ORDER — ACETAMINOPHEN 325 MG PO TABS
650.0000 mg | ORAL_TABLET | Freq: Once | ORAL | Status: AC
  Administered 2022-08-01: 650 mg via ORAL
  Filled 2022-08-01: qty 2

## 2022-08-01 MED ORDER — SODIUM CHLORIDE 0.9 % IV SOLN
Freq: Once | INTRAVENOUS | Status: AC
  Filled 2022-08-01: qty 250

## 2022-08-01 MED ORDER — SODIUM CHLORIDE 0.9 % IV SOLN
80.0000 mg/m2 | Freq: Once | INTRAVENOUS | Status: AC
  Administered 2022-08-01: 120 mg via INTRAVENOUS
  Filled 2022-08-01: qty 20

## 2022-08-01 MED ORDER — TRASTUZUMAB-ANNS CHEMO 150 MG IV SOLR
2.0000 mg/kg | Freq: Once | INTRAVENOUS | Status: AC
  Administered 2022-08-01: 105 mg via INTRAVENOUS
  Filled 2022-08-01: qty 5

## 2022-08-01 MED ORDER — SODIUM CHLORIDE 0.9 % IV SOLN
10.0000 mg | Freq: Once | INTRAVENOUS | Status: AC
  Administered 2022-08-01: 10 mg via INTRAVENOUS
  Filled 2022-08-01: qty 10

## 2022-08-01 NOTE — Patient Instructions (Signed)
[facility]  Discharge Instructions: Thank you for choosing [facility] to provide your oncology and hematology care.  If you have a lab appointment with the Cancer Center, please go directly to the Cancer Center and check in at the registration area.  Wear comfortable clothing and clothing appropriate for easy access to any Portacath or PICC line.   We strive to give you quality time with your provider. You may need to reschedule your appointment if you arrive late (15 or more minutes).  Arriving late affects you and other patients whose appointments are after yours.  Also, if you miss three or more appointments without notifying the office, you may be dismissed from the clinic at the provider's discretion.      For prescription refill requests, have your pharmacy contact our office and allow 72 hours for refills to be completed.     To help prevent nausea and vomiting after your treatment, we encourage you to take your nausea medication as directed.  BELOW ARE SYMPTOMS THAT SHOULD BE REPORTED IMMEDIATELY: *FEVER GREATER THAN 100.4 F (38 C) OR HIGHER *CHILLS OR SWEATING *NAUSEA AND VOMITING THAT IS NOT CONTROLLED WITH YOUR NAUSEA MEDICATION *UNUSUAL SHORTNESS OF BREATH *UNUSUAL BRUISING OR BLEEDING *URINARY PROBLEMS (pain or burning when urinating, or frequent urination) *BOWEL PROBLEMS (unusual diarrhea, constipation, pain near the anus) TENDERNESS IN MOUTH AND THROAT WITH OR WITHOUT PRESENCE OF ULCERS (sore throat, sores in mouth, or a toothache) UNUSUAL RASH, SWELLING OR PAIN  UNUSUAL VAGINAL DISCHARGE OR ITCHING   Items with * indicate a potential emergency and should be followed up as soon as possible or go to the Emergency Department if any problems should occur.  Please show the CHEMOTHERAPY ALERT CARD or IMMUNOTHERAPY ALERT CARD at check-in to the Emergency Department and triage nurse.  Should you have questions after your visit  or need to cancel or reschedule your appointment, please contact [facility]  [phone] and follow the prompts.  Office hours are 8:00 a.m. to 4:30 p.m. Monday - Friday. Please note that voicemails left after 4:00 p.m. may not be returned until the following business day.  We are closed weekends and major holidays. You have access to a nurse at all times for urgent questions. Please call the main number to the clinic [phone] and follow the prompts.  For any non-urgent questions, you may also contact your provider using MyChart. We now offer e-Visits for anyone 18 and older to request care online for non-urgent symptoms. For details visit [email or web address].   Also download the MyChart app! Go to the app store, search "MyChart", open the app, select [facility], and log in with your MyChart username and password.    

## 2022-08-01 NOTE — Progress Notes (Signed)
Cancer Center CONSULT NOTE  Patient Care Team: Lorre Munroe, NP as PCP - General (Internal Medicine) Hulen Luster, RN as Oncology Nurse Navigator   CANCER STAGING   Cancer Staging  Breast cancer [facility]) Staging form: Breast, AJCC 8th Edition - Clinical stage from 05/17/2022: Stage IA (cT1b, cN0, cM0, G3, ER-, PR-, HER2+) - Signed by Michaelyn Barter, MD on 05/27/2022 Stage prefix: Initial diagnosis Histologic grading system: 3 grade system  Current treatment Weekly paclitaxel with Herceptin (APT regimen) started on 07/18/2022  ASSESSMENT & PLAN:  Samanthagrace Corsiglia 62 y.o. female with pmh of osteoporosis and anxiety was referred to medical oncology for management of left breast stage Ia invasive ductal cancer.  # Left breast IDC stage IA, ER/PR negative, HER2 positive -Patient felt a palpable mass in her left breast.   -S/p left breast lumpectomy with SLNB by Dr. Tonna Boehringer on 06/09/2022. Pathology showed 1.4 cm IDC, grade 3, DCIS present grade 2-3 with comedonecrosis, 0/3 lymph nodes negative, margins negative, pT1c.  ER/PR negative.  HER2 positive  - Echocardiogram done on 07/14/2022 showed EF of 50 to 55%.  No regional wall motion abnormalities.  -Patient seen today prior to cycle 1 day 15 of Taxol and Herceptin.  Overall tolerating treatments well.  Labs reviewed and acceptable for treatment.  Will proceed.  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 -Had an transient episode of mild tingling in her left finger on Friday.  Has mild numbness now.  Continue to closely monitor.  Continue with cryotherapy  # Anxiety -Worsened with the need of chemotherapy. -Continue with Ativan 0.5 mg every 8 hours as needed.  On Wellbutrin.  # Decreased appetite -From chemotherapy.  Discussed about adding supplements such as Molli Posey with Ensure.  Following with nutrition  # Chemo induced mucositis -Continue with Magic  mouthwash as needed.  # Evaluated by genetics-testing negative  # Access-port.  Placed by Dr. Tonna Boehringer on 06/30/2022.  # Osteoporosis -On alendronate  Orders Placed This Encounter  Procedures   Ferritin    Standing Status:   Future    Number of Occurrences:   1    Standing Expiration Date:   08/01/2023   Iron and TIBC([facility])    Standing Status:   Future    Number of Occurrences:   1    Standing Expiration Date:   08/01/2023   RTC in 1 week for MD visit, labs, cycle 1 day 22 of Taxol and Herceptin.  The total time spent in the appointment was 30 minutes encounter with patients including review of chart and various tests results, discussions about plan of care and coordination of care plan   All questions were answered. The patient knows to call the clinic with any problems, questions or concerns. No barriers to learning was detected.  Michaelyn Barter, MD 5/6/202411:17 AM   HISTORY OF PRESENTING ILLNESS:  Mykesha Michels 62 y.o. female with pmh of osteoporosis on alendronate and anxiety was referred to medical oncology for further management of stage Ia left breast invasive ductal cancer HER2 positive  Patient felt a palpable mass in her left breast mid February 2024.  She sought medical attention from PCP.  Further imaging as below.  INTERVAL HISTORY- Patient was seen today accompanied by husband prior to cycle 1 day 15 of Taxol and Herceptin.  She has decreased appetite with chemotherapy.  Just does not feel like eating.  She is looking into [employer]  supplements and will let us know which 1 she would like to proceed.  She is looking for prescription and see if insurance will cover it.  Weight is stable.  Had nausea on and off managed with Zofran as needed.  Had an episode of constipation last Friday.  Took 2 senna and then had diarrhea the next day.  Had an episode of transient tingling in the left finger which resolved now has mild numbness.  Has a small stitch  coming out from the port site.  Otherwise the site is looking good.  She plans to put a gauze piece on for the next couple of weeks.  It is irritating her when she changes her clothes.  I have reviewed her chart and materials related to her cancer extensively and collaborated history with the patient. Summary of oncologic history is as follows: Oncology History  Breast cancer (HCC)  05/11/2022 Mammogram   Diagnostic mammogram and ultrasound- FINDINGS: Full field and spot compression views of the LEFT breast demonstrate a 1 cm irregular mass within the UPPER INNER LEFT breast, at the site of patient concern. The patient has retropectoral implants.   On physical exam, a firm palpable mass identified at the 11 o'clock position of the LEFT breast 8 cm from the nipple.   Targeted ultrasound is performed, showing a 0.9 x 0.8 x 1 cm irregular hypoechoic mass at the 11 o'clock position of the LEFT breast 8 cm from the nipple.   No abnormal LEFT axillary lymph nodes are noted.   IMPRESSION: 1. Suspicious 1 cm UPPER INNER LEFT breast mass. Tissue sampling is recommended. 2. No abnormal appearing LEFT axillary lymph nodes.   05/17/2022 Cancer Staging   Staging form: Breast, AJCC 8th Edition - Clinical stage from 05/17/2022: Stage IA (cT1b, cN0, cM0, G3, ER-, PR-, HER2+) - Signed by Michaelyn Barter, MD on 05/27/2022 Stage prefix: Initial diagnosis Histologic grading system: 3 grade system   05/17/2022 Initial Biopsy   DIAGNOSIS:  A. BREAST, LEFT, 11:00, 8 CM FROM NIPPLE, ULTRASOUND-GUIDED BIOPSY:   - INVASIVE MAMMARY CARCINOMA, NOS DUCTAL.   Size of invasive carcinoma: 10 mm in this sample  Histologic grade of invasive carcinoma: Grade 3                       Glandular/tubular differentiation score: 3                       Nuclear pleomorphism score: 3                       Mitotic rate score: 2                       Total score: 8  Ductal carcinoma in situ: Not identified  Lymphovascular  invasion: Not identified   Estrogen Receptor (ER) Status: NEGATIVE (LESS THAN 1%)   Progesterone Receptor (PgR) Status: NEGATIVE (LESS THAN 1%)   HER2 (by immunohistochemistry): POSITIVE (Score 3+)       Percentage of cells with uniform intense complete membrane  staining: 80%  Ki-67: Not performed     Genetic Testing   No pathogenic variants identified on the [facility] Multi-Cancer+RNA panel. VUS in MUTYH called c.1484G>A identified. The report date is 06/09/2022.  The Multi-Cancer + RNA Panel offered by [facility] includes sequencing and/or deletion/duplication analysis of the following 70 genes:  AIP*, ALK, APC*, ATM*, AXIN2*, BAP1*, BARD1*, BLM*, BMPR1A*, BRCA1*, BRCA2*,  BRIP1*, CDC73*, CDH1*, CDK4, CDKN1B*, CDKN2A, CHEK2*, CTNNA1*, DICER1*, EPCAM, EGFR, FH*, FLCN*, GREM1, HOXB13, KIT, LZTR1, MAX*, MBD4, MEN1*, MET, MITF, MLH1*, MSH2*, MSH3*, MSH6*, MUTYH*, NF1*, NF2*, NTHL1*, PALB2*, PDGFRA, PMS2*, POLD1*, POLE*, POT1*, PRKAR1A*, PTCH1*, PTEN*, RAD51C*, RAD51D*, RB1*, RET, SDHA*, SDHAF2*, SDHB*, SDHC*, SDHD*, SMAD4*, SMARCA4*, SMARCB1*, SMARCE1*, STK11*, SUFU*, TMEM127*, TP53*, TSC1*, TSC2*, VHL*. RNA analysis is performed for * genes.   06/09/2022 Surgery   S/p left breast lumpectomy with SLNB by Dr. Tonna Boehringer  Pathology showed 1.4 cm IDC, grade 3, DCIS present grade 2-3 with comedonecrosis, 0/3 lymph nodes negative, margins negative, pT1c.  ER/PR negative.  HER2 positive   07/18/2022 -  Chemotherapy   Patient is on Treatment Plan : BREAST Paclitaxel + Trastuzumab q7d / Trastuzumab q21d      Menarche age 51 or 16 Age at first birth 7 Used birth control pills and Depo more than 5 years Menopause was age 68 Hysterectomy yes for prolapsed uterus HRT no History of breast biopsies yes for breast cyst in left lower Family history of lung cancer in mother who was smoker  MEDICAL HISTORY:  Past Medical History:  Diagnosis Date   Allergy    Anxiety    Arthritis    Breast cancer (HCC)  05/27/2022   IBS (irritable bowel syndrome)    Trigger finger    Left Middle Finger 11/2016    SURGICAL HISTORY: Past Surgical History:  Procedure Laterality Date   ABDOMINAL HYSTERECTOMY  02/2016   Only cervix remains (done for prolapse)   AUGMENTATION MAMMAPLASTY Bilateral 2010   BREAST BIOPSY Left 05/17/2022   [place] BX, ribbon clip, path pending   BREAST BIOPSY Left 05/17/2022   [place] LT BREAST BX W LOC DEV 1ST LESION IMG BX SPEC US GUIDE 05/17/2022 [facility]   BREAST CYST ASPIRATION Left 2010   neg   BREAST ENHANCEMENT SURGERY  1998   CHOLECYSTECTOMY     COLONOSCOPY WITH PROPOFOL N/A 10/10/2019   Procedure: COLONOSCOPY WITH PROPOFOL;  Surgeon: Wyline Mood, MD;  Location: [facility] ENDOSCOPY;  Service: Gastroenterology;  Laterality: N/A;   PARTIAL MASTECTOMY WITH AXILLARY SENTINEL LYMPH NODE BIOPSY Left 06/09/2022   Procedure: PARTIAL MASTECTOMY WITH AXILLARY SENTINEL LYMPH NODE BIOPSY;  Surgeon: Sung Amabile, DO;  Location: [facility] ORS;  Service: General;  Laterality: Left;   PORTACATH PLACEMENT N/A 06/30/2022   Procedure: INSERTION PORT-A-CATH;  Surgeon: Sung Amabile, DO;  Location: [facility] ORS;  Service: General;  Laterality: N/A;   TYMPANOPLASTY Left     SOCIAL HISTORY: Social History   Socioeconomic History   Marital status: Married    Spouse name: Onalee Hua   Number of children: 1   Years of education: Not on file   Highest education level: Not on file  Occupational History   Not on file  Tobacco Use   Smoking status: Former    Types: Cigarettes    Quit date: 09/09/1992    Years since quitting: 29.9   Smokeless tobacco: Current    Types: Chew   Tobacco comments:    Chewing tobacco   Vaping Use   Vaping Use: Former  Substance and Sexual Activity   Alcohol use: Not Currently   Drug use: Yes    Types: Marijuana    Comment: daily   Sexual activity: Yes    Birth control/protection: None  Other Topics Concern   Not on file  Social History Narrative   Not on file   Social  Determinants of Health   Financial Resource Strain: Not on file  Food Insecurity: No Food Insecurity (05/27/2022)   Hunger Vital Sign    Worried About Running Out of Food in the Last Year: Never true    Ran Out of Food in the Last Year: Never true  Transportation Needs: No Transportation Needs (05/27/2022)   PRAPARE - Administrator, Civil Service (Medical): No    Lack of Transportation (Non-Medical): No  Physical Activity: Not on file  Stress: Not on file  Social Connections: Not on file  Intimate Partner Violence: Not At Risk (05/27/2022)   Humiliation, Afraid, Rape, and Kick questionnaire    Fear of Current or Ex-Partner: No    Emotionally Abused: No    Physically Abused: No    Sexually Abused: No    FAMILY HISTORY: Family History  Problem Relation Age of Onset   Lung cancer Mother 32   Hypertension Mother    Lupus Father    Leukemia Cousin 6   Breast cancer Neg Hx     ALLERGIES:  is allergic to poison ivy extract.  MEDICATIONS:  Current Outpatient Medications  Medication Sig Dispense Refill   ADVANCED FIBER COMPLEX PO Take 3 tablets by mouth daily. Gummies     alendronate (FOSAMAX) 70 MG tablet Take 70 mg by mouth once a week.     buPROPion (WELLBUTRIN SR) 150 MG 12 hr tablet TAKE 1 TABLET TWICE A DAY 180 tablet 0   busPIRone (BUSPAR) 5 MG tablet Take 1 tablet (5 mg total) by mouth 2 (two) times daily as needed. 60 tablet 2   HYDROcodone-acetaminophen (NORCO) 5-325 MG tablet Take 1 tablet by mouth every 6 (six) hours as needed for up to 6 doses for moderate pain. 6 tablet 0   ibuprofen (ADVIL) 600 MG tablet Take 1 tablet (600 mg total) by mouth every 6 (six) hours as needed. 30 tablet 0   lidocaine-prilocaine (EMLA) cream Apply to affected area once 30 g 3   LORazepam (ATIVAN) 0.5 MG tablet Take 1 tablet (0.5 mg total) by mouth every 8 (eight) hours. 30 tablet 0   magic mouthwash w/lidocaine SOLN Take 5 mLs by mouth 4 (four) times daily. 480 mL 3   metroNIDAZOLE  (METROCREAM) 0.75 % cream Apply topically as needed. (Patient taking differently: Apply 1 Application topically as needed.)     ondansetron (ZOFRAN) 8 MG tablet Take 1 tablet (8 mg total) by mouth every 8 (eight) hours as needed for nausea or vomiting. 30 tablet 1   OVER THE COUNTER MEDICATION 1.5 tablets in the morning and at bedtime. Calcium Bone Strength Supplement1     OVER THE COUNTER MEDICATION 2 tablets daily. Vitex Berry     OVER THE COUNTER MEDICATION in the morning, at noon, and at bedtime. Milk Thisle Seeds Tea     prochlorperazine (COMPAZINE) 10 MG tablet Take 1 tablet (10 mg total) by mouth every 6 (six) hours as needed for nausea or vomiting. 30 tablet 1   triamcinolone cream (KENALOG) 0.1 % Apply 1 Application topically as needed.     magic mouthwash (multi-ingredient) oral suspension Take 5-10 mLs by mouth 4 (four) times daily as needed. (Patient not taking: Reported on 08/01/2022) 480 mL 3   No current facility-administered medications for this visit.   Facility-Administered Medications Ordered in Other Visits  Medication Dose Route Frequency Provider Last Rate Last Admin   acetaminophen (TYLENOL) tablet 650 mg  650 mg Oral Once Michaelyn Barter, MD       dexamethasone (DECADRON) 10 mg in sodium chloride  0.9 % 50 mL IVPB  10 mg Intravenous Once Michaelyn Barter, MD 204 mL/hr at 08/01/22 1115 10 mg at 08/01/22 1115   diphenhydrAMINE (BENADRYL) injection 50 mg  50 mg Intravenous Once Michaelyn Barter, MD       famotidine (PEPCID) IVPB 20 mg premix  20 mg Intravenous Once Michaelyn Barter, MD       PACLitaxel (TAXOL) 120 mg in sodium chloride 0.9 % 250 mL chemo infusion (</= 80mg /m2)  80 mg/m2 (Treatment Plan Recorded) Intravenous Once Michaelyn Barter, MD       trastuzumab-[facility]) 105 mg in sodium chloride 0.9 % 250 mL chemo infusion  2 mg/kg (Treatment Plan Recorded) Intravenous Once Michaelyn Barter, MD        REVIEW OF SYSTEMS:   Pertinent information mentioned in HPI All  other systems were reviewed with the patient and are negative.  PHYSICAL EXAMINATION: ECOG PERFORMANCE STATUS: 0 - Asymptomatic  Vitals:   08/01/22 1016  BP: 126/73  Pulse: 78  Temp: 98.5 F (36.9 C)  SpO2: 100%      Filed Weights   08/01/22 1016  Weight: 114 lb 9.6 oz (52 kg)      GENERAL:alert, no distress and comfortable SKIN: skin color, texture, turgor are normal, no rashes or significant lesions EYES: normal, conjunctiva are pink and non-injected, sclera clear OROPHARYNX:no exudate, no erythema and lips, buccal mucosa, and tongue normal  NECK: supple, thyroid normal size, non-tender, without nodularity LYMPH:  no palpable lymphadenopathy in the cervical, axillary or inguinal LUNGS: clear to auscultation and percussion with normal breathing effort HEART: regular rate & rhythm and no murmurs and no lower extremity edema ABDOMEN:abdomen soft, non-tender and normal bowel sounds Musculoskeletal:no cyanosis of digits and no clubbing  PSYCH: alert & oriented x 3 with fluent speech NEURO: no focal motor/sensory deficits  LABORATORY DATA:  I have reviewed the data as listed Lab Results  Component Value Date   WBC 3.5 (L) 08/01/2022   HGB 12.1 08/01/2022   HCT 36.6 08/01/2022   MCV 88.8 08/01/2022   PLT 280 08/01/2022   Recent Labs    07/18/22 0922 07/25/22 0857 08/01/22 0937  NA 137 134* 137  K 3.8 3.9 4.0  CL 105 104 103  CO2 24 24 26   GLUCOSE 130* 120* 130*  BUN 20 24* 17  CREATININE 0.83 1.03* 0.85  CALCIUM 8.8* 8.4* 9.2  GFRNONAA >60 >60 >60  PROT 6.5 6.2* 6.6  ALBUMIN 3.9 3.6 3.7  AST 19 19 22   ALT 14 13 24   ALKPHOS 46 51 52  BILITOT 0.5 0.2* 0.6    RADIOGRAPHIC STUDIES: I have personally reviewed the radiological images as listed and agreed with the findings in the report. ECHOCARDIOGRAM COMPLETE  Result Date: 07/14/2022    ECHOCARDIOGRAM REPORT   Patient Name:   AQUA UNTHANK Date of Exam: 07/14/2022 Medical Rec #:  [id number]    Height:        65.0 in Accession #:    [id number]   Weight:       114.2 lb Date of Birth:  1961/01/04     BSA:          1.559 m Patient Age:    61 years     BP:           143/73 mmHg Patient Gender: F            HR:           54 bpm. Exam Location:  [facility] Procedure:  2D Echo, Cardiac Doppler, Color Doppler, Strain Analysis and 3D Echo Indications:     Chemo  History:         Patient has no prior history of Echocardiogram examinations.                  Breast CA.  Sonographer:     Mikki Harbor Referring Phys:  [id number] Michaelyn Barter Diagnosing Phys: Julien Nordmann MD  Sonographer Comments: Image acquisition challenging due to breast implants. Global longitudinal strain was attempted. IMPRESSIONS  1. Left ventricular ejection fraction, by estimation, is 50 to 55%. The left ventricle has low normal function. The left ventricle has no regional wall motion abnormalities. Left ventricular diastolic parameters were normal. The average left ventricular  global longitudinal strain is -16.7 %. The global longitudinal strain is normal.  2. Right ventricular systolic function is normal. The right ventricular size is normal. There is normal pulmonary artery systolic pressure. The estimated right ventricular systolic pressure is 18.7 mmHg.  3. The mitral valve is normal in structure. Mild mitral valve regurgitation. No evidence of mitral stenosis.  4. Tricuspid valve regurgitation is mild to moderate.  5. The aortic valve is tricuspid. Aortic valve regurgitation is trivial. No aortic stenosis is present.  6. There is borderline dilatation of the aortic root, measuring 36 mm.  7. The inferior vena cava is normal in size with greater than 50% respiratory variability, suggesting right atrial pressure of 3 mmHg. FINDINGS  Left Ventricle: Left ventricular ejection fraction, by estimation, is 50 to 55%. The left ventricle has low normal function. The left ventricle has no regional wall motion abnormalities. The average left ventricular global  longitudinal strain is -16.7 %. The global longitudinal strain is normal. The left ventricular internal cavity size was normal in size. There is no left ventricular hypertrophy. Left ventricular diastolic parameters were normal. Right Ventricle: The right ventricular size is normal. No increase in right ventricular wall thickness. Right ventricular systolic function is normal. There is normal pulmonary artery systolic pressure. The tricuspid regurgitant velocity is 1.98 m/s, and  with an assumed right atrial pressure of 3 mmHg, the estimated right ventricular systolic pressure is 18.7 mmHg. Left Atrium: Left atrial size was normal in size. Right Atrium: Right atrial size was normal in size. Pericardium: There is no evidence of pericardial effusion. Mitral Valve: The mitral valve is normal in structure. Mild mitral valve regurgitation. No evidence of mitral valve stenosis. MV peak gradient, 2.3 mmHg. The mean mitral valve gradient is 1.0 mmHg. Tricuspid Valve: The tricuspid valve is normal in structure. Tricuspid valve regurgitation is mild to moderate. No evidence of tricuspid stenosis. Aortic Valve: The aortic valve is tricuspid. Aortic valve regurgitation is trivial. No aortic stenosis is present. Aortic valve mean gradient measures 2.0 mmHg. Aortic valve peak gradient measures 3.9 mmHg. Aortic valve area, by VTI measures 2.85 cm. Pulmonic Valve: The pulmonic valve was normal in structure. Pulmonic valve regurgitation is mild. No evidence of pulmonic stenosis. Aorta: The aortic root is normal in size and structure. There is borderline dilatation of the aortic root, measuring 36 mm. Venous: The inferior vena cava is normal in size with greater than 50% respiratory variability, suggesting right atrial pressure of 3 mmHg. IAS/Shunts: No atrial level shunt detected by color flow Doppler.  LEFT VENTRICLE PLAX 2D LVIDd:         4.40 cm     Diastology LVIDs:         3.00 cm  LV e' medial:    12.70 cm/s LV PW:          0.80 cm     LV E/e' medial:  6.6 LV IVS:        0.80 cm     LV e' lateral:   14.80 cm/s LVOT diam:     2.00 cm     LV E/e' lateral: 5.6 LV SV:         66 LV SV Index:   42          2D Longitudinal Strain LVOT Area:     3.14 cm    2D Strain GLS Avg:     -16.7 %  LV Volumes (MOD) LV vol d, MOD A2C: 58.9 ml 3D Volume EF: LV vol d, MOD A4C: 64.3 ml 3D EF:        55 % LV vol s, MOD A2C: 27.2 ml LV EDV:       121 ml LV vol s, MOD A4C: 25.3 ml LV ESV:       55 ml LV SV MOD A2C:     31.7 ml LV SV:        67 ml LV SV MOD A4C:     64.3 ml LV SV MOD BP:      36.6 ml RIGHT VENTRICLE RV Basal diam:  3.35 cm RV Mid diam:    2.90 cm RV S prime:     11.30 cm/s TAPSE (M-mode): 2.1 cm LEFT ATRIUM             Index        RIGHT ATRIUM           Index LA diam:        3.30 cm 2.12 cm/m   RA Area:     17.80 cm LA Vol (A2C):   41.1 ml 26.36 ml/m  RA Volume:   49.20 ml  31.56 ml/m LA Vol (A4C):   37.1 ml 23.80 ml/m LA Biplane Vol: 41.9 ml 26.88 ml/m  AORTIC VALVE                    PULMONIC VALVE AV Area (Vmax):    2.92 cm     PV Vmax:       0.72 m/s AV Area (Vmean):   2.81 cm     PV Peak grad:  2.1 mmHg AV Area (VTI):     2.85 cm AV Vmax:           98.70 cm/s AV Vmean:          61.500 cm/s AV VTI:            0.230 m AV Peak Grad:      3.9 mmHg AV Mean Grad:      2.0 mmHg LVOT Vmax:         91.80 cm/s LVOT Vmean:        55.100 cm/s LVOT VTI:          0.209 m LVOT/AV VTI ratio: 0.91  AORTA Ao Root diam: 3.60 cm MITRAL VALVE               TRICUSPID VALVE MV Area (PHT): 3.48 cm    TR Peak grad:   15.7 mmHg MV Area VTI:   2.26 cm    TR Vmax:        198.00 cm/s MV Peak grad:  2.3 mmHg MV Mean grad:  1.0 mmHg    SHUNTS MV  Vmax:       0.76 m/s    Systemic VTI:  0.21 m MV Vmean:      35.2 cm/s   Systemic Diam: 2.00 cm MV Decel Time: 218 msec MV E velocity: 83.30 cm/s MV A velocity: 45.10 cm/s MV E/A ratio:  1.85 Julien Nordmann MD Electronically signed by Julien Nordmann MD Signature Date/Time: 07/14/2022/12:06:22 PM    Final

## 2022-08-01 NOTE — Progress Notes (Signed)
After all premeds were given and finished, patient complaining of severe restlessness, like she can't sit still and needs to move and change positions every few seconds. Patient stated that it started right after the IV benadryl was given; (same dose as given before, but she says this didn't happen before).   Only pre meds have been given so far. MD notified.  MD came to chairside. VSS. Reassured patient that her symptoms were a side effect of the benadryl. MD may reduce the dose of benadryl or change to PO for future infusions.  Proceed with treatment per MD.  Treatment tolerated without incident. Patient's symptoms resolved. Patient discharged in stable condition.

## 2022-08-01 NOTE — Progress Notes (Signed)
Pt. Is having some is concerned with low iron so she is taking a spoon full of [facility], also having numbness in her left pointer and middle finger. Pt. Is still having some tenderness were her port is. Some diarrhea Monday-Wednesday, Friday. Plus pt. Isn't having the desire to eat anything.

## 2022-08-02 ENCOUNTER — Encounter: Payer: Self-pay | Admitting: Internal Medicine

## 2022-08-02 ENCOUNTER — Inpatient Hospital Stay: Payer: 59

## 2022-08-02 ENCOUNTER — Other Ambulatory Visit: Payer: Self-pay

## 2022-08-02 NOTE — Progress Notes (Signed)
Nutrition  Routed question from patient regarding insurance covering [employer] 1.4 shakes orally.   Called and spoke with patient.  Reports that appetite is not that good.  She had researched [employer] shake in the past for a family member with dementia.  She wants to try them for herself.    Intervention: Typically, most private insurance companies will not cover oral nutrition supplements.   Patient in agreement for Representative from [facility] [employer] to reach out to her to discuss how she can purchase [employer] at reduced price.   Offered samples for patient to try before purchasing and declined at this time.    Patient appreciative of call.   Follow-up as planned on 5/14  Kaled Allende B. Freida Busman, RD, LDN Registered Dietitian [phone]

## 2022-08-05 ENCOUNTER — Encounter: Payer: Self-pay | Admitting: Internal Medicine

## 2022-08-05 MED FILL — Dexamethasone Sodium Phosphate Inj 100 MG/10ML: INTRAMUSCULAR | Qty: 1 | Status: AC

## 2022-08-08 ENCOUNTER — Encounter: Payer: Self-pay | Admitting: Internal Medicine

## 2022-08-08 ENCOUNTER — Inpatient Hospital Stay: Payer: 59

## 2022-08-08 ENCOUNTER — Inpatient Hospital Stay (HOSPITAL_BASED_OUTPATIENT_CLINIC_OR_DEPARTMENT_OTHER): Payer: 59 | Admitting: Internal Medicine

## 2022-08-08 VITALS — BP 126/68 | HR 75 | Temp 97.3°F | Wt 113.4 lb

## 2022-08-08 DIAGNOSIS — G620 Drug-induced polyneuropathy: Secondary | ICD-10-CM

## 2022-08-08 DIAGNOSIS — C50912 Malignant neoplasm of unspecified site of left female breast: Secondary | ICD-10-CM

## 2022-08-08 DIAGNOSIS — K521 Toxic gastroenteritis and colitis: Secondary | ICD-10-CM | POA: Diagnosis not present

## 2022-08-08 DIAGNOSIS — Z5111 Encounter for antineoplastic chemotherapy: Secondary | ICD-10-CM | POA: Diagnosis not present

## 2022-08-08 DIAGNOSIS — D0512 Intraductal carcinoma in situ of left breast: Secondary | ICD-10-CM | POA: Diagnosis not present

## 2022-08-08 DIAGNOSIS — Z171 Estrogen receptor negative status [ER-]: Secondary | ICD-10-CM

## 2022-08-08 DIAGNOSIS — Z5112 Encounter for antineoplastic immunotherapy: Secondary | ICD-10-CM

## 2022-08-08 DIAGNOSIS — T451X5A Adverse effect of antineoplastic and immunosuppressive drugs, initial encounter: Secondary | ICD-10-CM | POA: Insufficient documentation

## 2022-08-08 LAB — CBC WITH DIFFERENTIAL (CANCER CENTER ONLY)
Abs Immature Granulocytes: 0.02 10*3/uL (ref 0.00–0.07)
Basophils Absolute: 0.1 10*3/uL (ref 0.0–0.1)
Basophils Relative: 2 %
Eosinophils Absolute: 0.1 10*3/uL (ref 0.0–0.5)
Eosinophils Relative: 3 %
HCT: 38.1 % (ref 36.0–46.0)
Hemoglobin: 12.6 g/dL (ref 12.0–15.0)
Immature Granulocytes: 1 %
Lymphocytes Relative: 35 %
Lymphs Abs: 1.4 10*3/uL (ref 0.7–4.0)
MCH: 29.3 pg (ref 26.0–34.0)
MCHC: 33.1 g/dL (ref 30.0–36.0)
MCV: 88.6 fL (ref 80.0–100.0)
Monocytes Absolute: 0.2 10*3/uL (ref 0.1–1.0)
Monocytes Relative: 5 %
Neutro Abs: 2.2 10*3/uL (ref 1.7–7.7)
Neutrophils Relative %: 54 %
Platelet Count: 334 10*3/uL (ref 150–400)
RBC: 4.3 MIL/uL (ref 3.87–5.11)
RDW: 12.6 % (ref 11.5–15.5)
WBC Count: 4 10*3/uL (ref 4.0–10.5)
nRBC: 0 % (ref 0.0–0.2)

## 2022-08-08 LAB — CMP (CANCER CENTER ONLY)
ALT: 17 U/L (ref 0–44)
AST: 21 U/L (ref 15–41)
Albumin: 3.9 g/dL (ref 3.5–5.0)
Alkaline Phosphatase: 41 U/L (ref 38–126)
Anion gap: 8 (ref 5–15)
BUN: 22 mg/dL (ref 8–23)
CO2: 24 mmol/L (ref 22–32)
Calcium: 8.6 mg/dL — ABNORMAL LOW (ref 8.9–10.3)
Chloride: 106 mmol/L (ref 98–111)
Creatinine: 0.67 mg/dL (ref 0.44–1.00)
GFR, Estimated: >60 mL/min (ref >60)
Glucose, Bld: 124 mg/dL — ABNORMAL HIGH (ref 70–99)
Potassium: 3.7 mmol/L (ref 3.5–5.1)
Sodium: 138 mmol/L (ref 135–145)
Total Bilirubin: 0.6 mg/dL (ref 0.3–1.2)
Total Protein: 6.7 g/dL (ref 6.5–8.1)

## 2022-08-08 MED ORDER — LOPERAMIDE HCL 2 MG PO CAPS: 2.0000 mg | ORAL_CAPSULE | ORAL | 0 refills | Status: DC | PRN

## 2022-08-08 MED ORDER — HEPARIN SOD (PORK) LOCK FLUSH 100 UNIT/ML IV SOLN
500.0000 [IU] | Freq: Once | INTRAVENOUS | Status: AC | PRN
  Administered 2022-08-08: 500 [IU]
  Filled 2022-08-08: qty 5

## 2022-08-08 MED ORDER — FAMOTIDINE IN NACL 20-0.9 MG/50ML-% IV SOLN
20.0000 mg | Freq: Once | INTRAVENOUS | Status: AC
  Administered 2022-08-08: 20 mg via INTRAVENOUS
  Filled 2022-08-08: qty 50

## 2022-08-08 MED ORDER — ACETAMINOPHEN 325 MG PO TABS
650.0000 mg | ORAL_TABLET | Freq: Once | ORAL | Status: AC
  Administered 2022-08-08: 650 mg via ORAL
  Filled 2022-08-08: qty 2

## 2022-08-08 MED ORDER — SODIUM CHLORIDE 0.9 % IV SOLN
80.0000 mg/m2 | Freq: Once | INTRAVENOUS | Status: AC
  Administered 2022-08-08: 120 mg via INTRAVENOUS
  Filled 2022-08-08: qty 20

## 2022-08-08 MED ORDER — SODIUM CHLORIDE 0.9% FLUSH
10.0000 mL | INTRAVENOUS | Status: DC | PRN
  Filled 2022-08-08: qty 10

## 2022-08-08 MED ORDER — TRASTUZUMAB-ANNS CHEMO 150 MG IV SOLR
2.0000 mg/kg | Freq: Once | INTRAVENOUS | Status: AC
  Administered 2022-08-08: 105 mg via INTRAVENOUS
  Filled 2022-08-08: qty 5

## 2022-08-08 MED ORDER — SODIUM CHLORIDE 0.9 % IV SOLN
10.0000 mg | Freq: Once | INTRAVENOUS | Status: AC
  Administered 2022-08-08: 10 mg via INTRAVENOUS
  Filled 2022-08-08: qty 10

## 2022-08-08 MED ORDER — DIPHENHYDRAMINE HCL 50 MG/ML IJ SOLN
50.0000 mg | Freq: Once | INTRAMUSCULAR | Status: AC
  Administered 2022-08-08: 50 mg via INTRAVENOUS
  Filled 2022-08-08: qty 1

## 2022-08-08 MED ORDER — SODIUM CHLORIDE 0.9 % IV SOLN
Freq: Once | INTRAVENOUS | Status: AC
  Filled 2022-08-08: qty 250

## 2022-08-08 NOTE — Progress Notes (Signed)
[facility] CONSULT NOTE  Patient Care Team: Lorre Munroe, NP as PCP - General (Internal Medicine) Hulen Luster, RN as Oncology Nurse Navigator   CANCER STAGING   Cancer Staging  Breast cancer [facility]) Staging form: Breast, AJCC 8th Edition - Clinical stage from 05/17/2022: Stage IA (cT1b, cN0, cM0, G3, ER-, PR-, HER2+) - Signed by Michaelyn Barter, MD on 05/27/2022 Stage prefix: Initial diagnosis Histologic grading system: 3 grade system  Current treatment Weekly paclitaxel with Herceptin (APT regimen) started on 07/18/2022  ASSESSMENT & PLAN:  Jennifer Garrison 62 y.o. female with pmh of osteoporosis and anxiety was referred to medical oncology for management of left breast stage Ia invasive ductal cancer.  # Left breast IDC stage IA, ER/PR negative, HER2 positive -Patient felt a palpable mass in her left breast.   -S/p left breast lumpectomy with SLNB by Dr. Tonna Boehringer on 06/09/2022. Pathology showed 1.4 cm IDC, grade 3, DCIS present grade 2-3 with comedonecrosis, 0/3 lymph nodes negative, margins negative, pT1c.  ER/PR negative.  HER2 positive  - Echocardiogram done on 07/14/2022 showed EF of 50 to 55%.  No regional wall motion abnormalities.  -Patient seen today prior to cycle 1 day 22 of Taxol and Herceptin.  Overall tolerating treatments well.  Labs reviewed and acceptable for treatment.  Will proceed.  # Chemotherapy related diarrhea -Starts 2 days after treatment lasting about 4 days. -Advised to use Imodium 2 mg every 4-6 hours as needed.  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 -Had an transient episode of mild tingling in her left finger on Friday.  Has mild numbness now.  Continue to closely monitor.  # Anxiety -Worsened with the need of chemotherapy. -Continue with Ativan 0.5 mg every 8 hours as needed.  On Wellbutrin.  # Decreased appetite -Started on [employer] supplements.  Follows  with nutrition.  # Chemo induced mucositis -Continue with Magic mouthwash as needed.  # Evaluated by genetics-testing negative  # Access-port.  Placed by Dr. Tonna Boehringer on 06/30/2022.  # Osteoporosis -On alendronate  Orders Placed This Encounter  Procedures   CBC with Differential (Cancer Center Only)    Standing Status:   Future    Standing Expiration Date:   08/23/2023   CMP (Cancer Center only)    Standing Status:   Future    Standing Expiration Date:   08/23/2023   CBC with Differential (Cancer Center Only)    Standing Status:   Future    Standing Expiration Date:   08/29/2023   CMP (Cancer Center only)    Standing Status:   Future    Standing Expiration Date:   08/29/2023   RTC in 1 week for MD visit, labs, cycle 2 day 1 of Taxol and Herceptin.  The total time spent in the appointment was 30 minutes encounter with patients including review of chart and various tests results, discussions about plan of care and coordination of care plan   All questions were answered. The patient knows to call the clinic with any problems, questions or concerns. No barriers to learning was detected.  Michaelyn Barter, MD 5/13/202410:33 AM   HISTORY OF PRESENTING ILLNESS:  Jennifer Garrison 62 y.o. female with pmh of osteoporosis on alendronate and anxiety was referred to medical oncology for further management of stage Ia left breast invasive ductal cancer HER2 positive  Patient felt a palpable mass in her left breast mid February 2024.  She sought medical attention from  PCP.  Further imaging as below.  INTERVAL HISTORY- Patient was seen today accompanied by husband prior to cycle 1 day 22 of Taxol and Herceptin.  Overall tolerating treatments well.  Has mild nausea which is very well-managed with Zofran.  Her states are the worst day for however her energy level is really down.  Had 6-8 episodes of semiformed stools starting last Wednesday and lasting until Saturday associated with some abdominal cramp.   Denies any fever, chills.  Neuropathy stable.  Started on [employer] supplements on Saturday.  Last week, after IV Benadryl and she noticed some restlessness otherwise her vitals were stable.  We discussed about decreasing the dose to 25 mg.  Patient is hesitant to decrease the dose and thinks that it might be related to her anxiety and not able to sleep the previous night.  She would like to continue with the full dose of Benadryl at this time and see how she does.  I have reviewed her chart and materials related to her cancer extensively and collaborated history with the patient. Summary of oncologic history is as follows: Oncology History  Breast cancer (HCC)  05/11/2022 Mammogram   Diagnostic mammogram and ultrasound- FINDINGS: Full field and spot compression views of the LEFT breast demonstrate a 1 cm irregular mass within the UPPER INNER LEFT breast, at the site of patient concern. The patient has retropectoral implants.   On physical exam, a firm palpable mass identified at the 11 o'clock position of the LEFT breast 8 cm from the nipple.   Targeted ultrasound is performed, showing a 0.9 x 0.8 x 1 cm irregular hypoechoic mass at the 11 o'clock position of the LEFT breast 8 cm from the nipple.   No abnormal LEFT axillary lymph nodes are noted.   IMPRESSION: 1. Suspicious 1 cm UPPER INNER LEFT breast mass. Tissue sampling is recommended. 2. No abnormal appearing LEFT axillary lymph nodes.   05/17/2022 Cancer Staging   Staging form: Breast, AJCC 8th Edition - Clinical stage from 05/17/2022: Stage IA (cT1b, cN0, cM0, G3, ER-, PR-, HER2+) - Signed by Michaelyn Barter, MD on 05/27/2022 Stage prefix: Initial diagnosis Histologic grading system: 3 grade system   05/17/2022 Initial Biopsy   DIAGNOSIS:  A. BREAST, LEFT, 11:00, 8 CM FROM NIPPLE, ULTRASOUND-GUIDED BIOPSY:   - INVASIVE MAMMARY CARCINOMA, NOS DUCTAL.   Size of invasive carcinoma: 10 mm in this sample  Histologic grade of  invasive carcinoma: Grade 3                       Glandular/tubular differentiation score: 3                       Nuclear pleomorphism score: 3                       Mitotic rate score: 2                       Total score: 8  Ductal carcinoma in situ: Not identified  Lymphovascular invasion: Not identified   Estrogen Receptor (ER) Status: NEGATIVE (LESS THAN 1%)   Progesterone Receptor (PgR) Status: NEGATIVE (LESS THAN 1%)   HER2 (by immunohistochemistry): POSITIVE (Score 3+)       Percentage of cells with uniform intense complete membrane  staining: 80%  Ki-67: Not performed     Genetic Testing   No pathogenic variants identified on the [facility] Multi-Cancer+RNA  panel. VUS in MUTYH called c.1484G>A identified. The report date is 06/09/2022.  The Multi-Cancer + RNA Panel offered by [facility] includes sequencing and/or deletion/duplication analysis of the following 70 genes:  AIP*, ALK, APC*, ATM*, AXIN2*, BAP1*, BARD1*, BLM*, BMPR1A*, BRCA1*, BRCA2*, BRIP1*, CDC73*, CDH1*, CDK4, CDKN1B*, CDKN2A, CHEK2*, CTNNA1*, DICER1*, EPCAM, EGFR, FH*, FLCN*, GREM1, HOXB13, KIT, LZTR1, MAX*, MBD4, MEN1*, MET, MITF, MLH1*, MSH2*, MSH3*, MSH6*, MUTYH*, NF1*, NF2*, NTHL1*, PALB2*, PDGFRA, PMS2*, POLD1*, POLE*, POT1*, PRKAR1A*, PTCH1*, PTEN*, RAD51C*, RAD51D*, RB1*, RET, SDHA*, SDHAF2*, SDHB*, SDHC*, SDHD*, SMAD4*, SMARCA4*, SMARCB1*, SMARCE1*, STK11*, SUFU*, TMEM127*, TP53*, TSC1*, TSC2*, VHL*. RNA analysis is performed for * genes.   06/09/2022 Surgery   S/p left breast lumpectomy with SLNB by Dr. Tonna Boehringer  Pathology showed 1.4 cm IDC, grade 3, DCIS present grade 2-3 with comedonecrosis, 0/3 lymph nodes negative, margins negative, pT1c.  ER/PR negative.  HER2 positive   07/18/2022 -  Chemotherapy   Patient is on Treatment Plan : BREAST Paclitaxel + Trastuzumab q7d / Trastuzumab q21d      Menarche age 64 or 50 Age at first birth 15 Used birth control pills and Depo more than 5 years Menopause was age  58 Hysterectomy yes for prolapsed uterus HRT no History of breast biopsies yes for breast cyst in left lower Family history of lung cancer in mother who was smoker  MEDICAL HISTORY:  Past Medical History:  Diagnosis Date   Allergy    Anxiety    Arthritis    Breast cancer (HCC) 05/27/2022   IBS (irritable bowel syndrome)    Trigger finger    Left Middle Finger 11/2016    SURGICAL HISTORY: Past Surgical History:  Procedure Laterality Date   ABDOMINAL HYSTERECTOMY  02/2016   Only cervix remains (done for prolapse)   AUGMENTATION MAMMAPLASTY Bilateral 2010   BREAST BIOPSY Left 05/17/2022   [place] BX, ribbon clip, path pending   BREAST BIOPSY Left 05/17/2022   [place] LT BREAST BX W LOC DEV 1ST LESION IMG BX SPEC US GUIDE 05/17/2022 [facility]   BREAST CYST ASPIRATION Left 2010   neg   BREAST ENHANCEMENT SURGERY  1998   CHOLECYSTECTOMY     COLONOSCOPY WITH PROPOFOL N/A 10/10/2019   Procedure: COLONOSCOPY WITH PROPOFOL;  Surgeon: Wyline Mood, MD;  Location[facility] ENDOSCOPY;  Service: Gastroenterology;  Laterality: N/A;   PARTIAL MASTECTOMY WITH AXILLARY SENTINEL LYMPH NODE BIOPSY Left 06/09/2022   Procedure: PARTIAL MASTECTOMY WITH AXILLARY SENTINEL LYMPH NODE BIOPSY;  Surgeon: Sung Amabile, DO;  Location: [facility] ORS;  Service: General;  Laterality: Left;   PORTACATH PLACEMENT N/A 06/30/2022   Procedure: INSERTION PORT-A-CATH;  Surgeon: Sung Amabile, DO;  Location: [facility] ORS;  Service: General;  Laterality: N/A;   TYMPANOPLASTY Left     SOCIAL HISTORY: Social History   Socioeconomic History   Marital status: Married    Spouse name: Onalee Hua   Number of children: 1   Years of education: Not on file   Highest education level: Not on file  Occupational History   Not on file  Tobacco Use   Smoking status: Former    Types: Cigarettes    Quit date: 09/09/1992    Years since quitting: 29.9   Smokeless tobacco: Current    Types: Chew   Tobacco comments:    Chewing tobacco   Vaping Use    Vaping Use: Former  Substance and Sexual Activity   Alcohol use: Not Currently   Drug use: Yes    Types: Marijuana    Comment: daily  Sexual activity: Yes    Birth control/protection: None  Other Topics Concern   Not on file  Social History Narrative   Not on file   Social Determinants of Health   Financial Resource Strain: Not on file  Food Insecurity: No Food Insecurity (05/27/2022)   Hunger Vital Sign    Worried About Running Out of Food in the Last Year: Never true    Ran Out of Food in the Last Year: Never true  Transportation Needs: No Transportation Needs (05/27/2022)   PRAPARE - Administrator, Civil Service (Medical): No    Lack of Transportation (Non-Medical): No  Physical Activity: Not on file  Stress: Not on file  Social Connections: Not on file  Intimate Partner Violence: Not At Risk (05/27/2022)   Humiliation, Afraid, Rape, and Kick questionnaire    Fear of Current or Ex-Partner: No    Emotionally Abused: No    Physically Abused: No    Sexually Abused: No    FAMILY HISTORY: Family History  Problem Relation Age of Onset   Lung cancer Mother 32   Hypertension Mother    Lupus Father    Leukemia Cousin 6   Breast cancer Neg Hx     ALLERGIES:  is allergic to poison ivy extract.  MEDICATIONS:  Current Outpatient Medications  Medication Sig Dispense Refill   ADVANCED FIBER COMPLEX PO Take 3 tablets by mouth daily. Gummies     alendronate (FOSAMAX) 70 MG tablet Take 70 mg by mouth once a week.     buPROPion (WELLBUTRIN SR) 150 MG 12 hr tablet TAKE 1 TABLET TWICE A DAY 180 tablet 0   busPIRone (BUSPAR) 5 MG tablet Take 1 tablet (5 mg total) by mouth 2 (two) times daily as needed. 60 tablet 2   HYDROcodone-acetaminophen (NORCO) 5-325 MG tablet Take 1 tablet by mouth every 6 (six) hours as needed for up to 6 doses for moderate pain. 6 tablet 0   ibuprofen (ADVIL) 600 MG tablet Take 1 tablet (600 mg total) by mouth every 6 (six) hours as needed. 30  tablet 0   lidocaine-prilocaine (EMLA) cream Apply to affected area once 30 g 3   loperamide (IMODIUM) 2 MG capsule Take 1 capsule (2 mg total) by mouth every 4 (four) hours as needed for diarrhea or loose stools. Every 4-6 hours as needed 30 capsule 0   LORazepam (ATIVAN) 0.5 MG tablet Take 1 tablet (0.5 mg total) by mouth every 8 (eight) hours. 30 tablet 0   magic mouthwash w/lidocaine SOLN Take 5 mLs by mouth 4 (four) times daily. 480 mL 3   metroNIDAZOLE (METROCREAM) 0.75 % cream Apply topically as needed. (Patient taking differently: Apply 1 Application topically as needed.)     ondansetron (ZOFRAN) 8 MG tablet Take 1 tablet (8 mg total) by mouth every 8 (eight) hours as needed for nausea or vomiting. 30 tablet 1   OVER THE COUNTER MEDICATION 1.5 tablets in the morning and at bedtime. Calcium Bone Strength Supplement1     OVER THE COUNTER MEDICATION 2 tablets daily. Vitex Berry     OVER THE COUNTER MEDICATION in the morning, at noon, and at bedtime. Milk Thisle Seeds Tea     prochlorperazine (COMPAZINE) 10 MG tablet Take 1 tablet (10 mg total) by mouth every 6 (six) hours as needed for nausea or vomiting. 30 tablet 1   triamcinolone cream (KENALOG) 0.1 % Apply 1 Application topically as needed.     magic mouthwash (multi-ingredient) oral  suspension Take 5-10 mLs by mouth 4 (four) times daily as needed. (Patient not taking: Reported on 08/01/2022) 480 mL 3   No current facility-administered medications for this visit.    REVIEW OF SYSTEMS:   Pertinent information mentioned in HPI All other systems were reviewed with the patient and are negative.  PHYSICAL EXAMINATION: ECOG PERFORMANCE STATUS: 0 - Asymptomatic  Vitals:   08/08/22 1011  BP: 126/68  Pulse: 75  Temp: (!) 97.3 F (36.3 C)  SpO2: 99%      Filed Weights   08/08/22 1011  Weight: 113 lb 6.4 oz (51.4 kg)      GENERAL:alert, no distress and comfortable SKIN: skin color, texture, turgor are normal, no rashes or  significant lesions EYES: normal, conjunctiva are pink and non-injected, sclera clear OROPHARYNX:no exudate, no erythema and lips, buccal mucosa, and tongue normal  NECK: supple, thyroid normal size, non-tender, without nodularity LYMPH:  no palpable lymphadenopathy in the cervical, axillary or inguinal LUNGS: clear to auscultation and percussion with normal breathing effort HEART: regular rate & rhythm and no murmurs and no lower extremity edema ABDOMEN:abdomen soft, non-tender and normal bowel sounds Musculoskeletal:no cyanosis of digits and no clubbing  PSYCH: alert & oriented x 3 with fluent speech NEURO: no focal motor/sensory deficits  LABORATORY DATA:  I have reviewed the data as listed Lab Results  Component Value Date   WBC 4.0 08/08/2022   HGB 12.6 08/08/2022   HCT 38.1 08/08/2022   MCV 88.6 08/08/2022   PLT 334 08/08/2022   Recent Labs    07/25/22 0857 08/01/22 0937 08/08/22 0940  NA 134* 137 138  K 3.9 4.0 3.7  CL 104 103 106  CO2 24 26 24   GLUCOSE 120* 130* 124*  BUN 24* 17 22  CREATININE 1.03* 0.85 0.67  CALCIUM 8.4* 9.2 8.6*  GFRNONAA >60 >60 >60  PROT 6.2* 6.6 6.7  ALBUMIN 3.6 3.7 3.9  AST 19 22 21   ALT 13 24 17   ALKPHOS 51 52 41  BILITOT 0.2* 0.6 0.6    RADIOGRAPHIC STUDIES: I have personally reviewed the radiological images as listed and agreed with the findings in the report. ECHOCARDIOGRAM COMPLETE  Result Date: 07/14/2022    ECHOCARDIOGRAM REPORT   Patient Name:   MOSHE BOAK Date of Exam: 07/14/2022 Medical Rec #:  [id number]    Height:       65.0 in Accession #:    [id number]   Weight:       114.2 lb Date of Birth:  Aug 05, 1960     BSA:          1.559 m Patient Age:    61 years     BP:           143/73 mmHg Patient Gender: F            HR:           54 bpm. Exam Location:  [facility] Procedure: 2D Echo, Cardiac Doppler, Color Doppler, Strain Analysis and 3D Echo Indications:     Chemo  History:         Patient has no prior history of Echocardiogram  examinations.                  Breast CA.  Sonographer:     Mikki Harbor Referring Phys:  [id number] Michaelyn Barter Diagnosing Phys: Julien Nordmann MD  Sonographer Comments: Image acquisition challenging due to breast implants. Global longitudinal strain was attempted. IMPRESSIONS  1. Left ventricular ejection  fraction, by estimation, is 50 to 55%. The left ventricle has low normal function. The left ventricle has no regional wall motion abnormalities. Left ventricular diastolic parameters were normal. The average left ventricular  global longitudinal strain is -16.7 %. The global longitudinal strain is normal.  2. Right ventricular systolic function is normal. The right ventricular size is normal. There is normal pulmonary artery systolic pressure. The estimated right ventricular systolic pressure is 18.7 mmHg.  3. The mitral valve is normal in structure. Mild mitral valve regurgitation. No evidence of mitral stenosis.  4. Tricuspid valve regurgitation is mild to moderate.  5. The aortic valve is tricuspid. Aortic valve regurgitation is trivial. No aortic stenosis is present.  6. There is borderline dilatation of the aortic root, measuring 36 mm.  7. The inferior vena cava is normal in size with greater than 50% respiratory variability, suggesting right atrial pressure of 3 mmHg. FINDINGS  Left Ventricle: Left ventricular ejection fraction, by estimation, is 50 to 55%. The left ventricle has low normal function. The left ventricle has no regional wall motion abnormalities. The average left ventricular global longitudinal strain is -16.7 %. The global longitudinal strain is normal. The left ventricular internal cavity size was normal in size. There is no left ventricular hypertrophy. Left ventricular diastolic parameters were normal. Right Ventricle: The right ventricular size is normal. No increase in right ventricular wall thickness. Right ventricular systolic function is normal. There is normal pulmonary artery  systolic pressure. The tricuspid regurgitant velocity is 1.98 m/s, and  with an assumed right atrial pressure of 3 mmHg, the estimated right ventricular systolic pressure is 18.7 mmHg. Left Atrium: Left atrial size was normal in size. Right Atrium: Right atrial size was normal in size. Pericardium: There is no evidence of pericardial effusion. Mitral Valve: The mitral valve is normal in structure. Mild mitral valve regurgitation. No evidence of mitral valve stenosis. MV peak gradient, 2.3 mmHg. The mean mitral valve gradient is 1.0 mmHg. Tricuspid Valve: The tricuspid valve is normal in structure. Tricuspid valve regurgitation is mild to moderate. No evidence of tricuspid stenosis. Aortic Valve: The aortic valve is tricuspid. Aortic valve regurgitation is trivial. No aortic stenosis is present. Aortic valve mean gradient measures 2.0 mmHg. Aortic valve peak gradient measures 3.9 mmHg. Aortic valve area, by VTI measures 2.85 cm. Pulmonic Valve: The pulmonic valve was normal in structure. Pulmonic valve regurgitation is mild. No evidence of pulmonic stenosis. Aorta: The aortic root is normal in size and structure. There is borderline dilatation of the aortic root, measuring 36 mm. Venous: The inferior vena cava is normal in size with greater than 50% respiratory variability, suggesting right atrial pressure of 3 mmHg. IAS/Shunts: No atrial level shunt detected by color flow Doppler.  LEFT VENTRICLE PLAX 2D LVIDd:         4.40 cm     Diastology LVIDs:         3.00 cm     LV e' medial:    12.70 cm/s LV PW:         0.80 cm     LV E/e' medial:  6.6 LV IVS:        0.80 cm     LV e' lateral:   14.80 cm/s LVOT diam:     2.00 cm     LV E/e' lateral: 5.6 LV SV:         66 LV SV Index:   42          2D  Longitudinal Strain LVOT Area:     3.14 cm    2D Strain GLS Avg:     -16.7 %  LV Volumes (MOD) LV vol d, MOD A2C: 58.9 ml 3D Volume EF: LV vol d, MOD A4C: 64.3 ml 3D EF:        55 % LV vol s, MOD A2C: 27.2 ml LV EDV:       121  ml LV vol s, MOD A4C: 25.3 ml LV ESV:       55 ml LV SV MOD A2C:     31.7 ml LV SV:        67 ml LV SV MOD A4C:     64.3 ml LV SV MOD BP:      36.6 ml RIGHT VENTRICLE RV Basal diam:  3.35 cm RV Mid diam:    2.90 cm RV S prime:     11.30 cm/s TAPSE (M-mode): 2.1 cm LEFT ATRIUM             Index        RIGHT ATRIUM           Index LA diam:        3.30 cm 2.12 cm/m   RA Area:     17.80 cm LA Vol (A2C):   41.1 ml 26.36 ml/m  RA Volume:   49.20 ml  31.56 ml/m LA Vol (A4C):   37.1 ml 23.80 ml/m LA Biplane Vol: 41.9 ml 26.88 ml/m  AORTIC VALVE                    PULMONIC VALVE AV Area (Vmax):    2.92 cm     PV Vmax:       0.72 m/s AV Area (Vmean):   2.81 cm     PV Peak grad:  2.1 mmHg AV Area (VTI):     2.85 cm AV Vmax:           98.70 cm/s AV Vmean:          61.500 cm/s AV VTI:            0.230 m AV Peak Grad:      3.9 mmHg AV Mean Grad:      2.0 mmHg LVOT Vmax:         91.80 cm/s LVOT Vmean:        55.100 cm/s LVOT VTI:          0.209 m LVOT/AV VTI ratio: 0.91  AORTA Ao Root diam: 3.60 cm MITRAL VALVE               TRICUSPID VALVE MV Area (PHT): 3.48 cm    TR Peak grad:   15.7 mmHg MV Area VTI:   2.26 cm    TR Vmax:        198.00 cm/s MV Peak grad:  2.3 mmHg MV Mean grad:  1.0 mmHg    SHUNTS MV Vmax:       0.76 m/s    Systemic VTI:  0.21 m MV Vmean:      35.2 cm/s   Systemic Diam: 2.00 cm MV Decel Time: 218 msec MV E velocity: 83.30 cm/s MV A velocity: 45.10 cm/s MV E/A ratio:  1.85 Julien Nordmann MD Electronically signed by Julien Nordmann MD Signature Date/Time: 07/14/2022/12:06:22 PM    Final

## 2022-08-08 NOTE — Progress Notes (Signed)
Pt is having nausea on Thursdays after her treatments, she says that the Zofran usually helps. Pt. Is also having some severe stomach  pains along with the diarrhea. Pt also stated she didn't want to decrease her benadryl dosage. She is starting to have some mouth sores as well.

## 2022-08-08 NOTE — Patient Instructions (Signed)
[facility] [place] [facility]  Discharge Instructions: Thank you for choosing [facility] to provide your oncology and hematology care.  If you have a lab appointment with the Cancer Center, please go directly to the Cancer Center and check in at the registration area.  Wear comfortable clothing and clothing appropriate for easy access to any Portacath or PICC line.   We strive to give you quality time with your provider. You may need to reschedule your appointment if you arrive late (15 or more minutes).  Arriving late affects you and other patients whose appointments are after yours.  Also, if you miss three or more appointments without notifying the office, you may be dismissed from the clinic at the provider's discretion.      For prescription refill requests, have your pharmacy contact our office and allow 72 hours for refills to be completed.     To help prevent nausea and vomiting after your treatment, we encourage you to take your nausea medication as directed.  BELOW ARE SYMPTOMS THAT SHOULD BE REPORTED IMMEDIATELY: *FEVER GREATER THAN 100.4 F (38 C) OR HIGHER *CHILLS OR SWEATING *NAUSEA AND VOMITING THAT IS NOT CONTROLLED WITH YOUR NAUSEA MEDICATION *UNUSUAL SHORTNESS OF BREATH *UNUSUAL BRUISING OR BLEEDING *URINARY PROBLEMS (pain or burning when urinating, or frequent urination) *BOWEL PROBLEMS (unusual diarrhea, constipation, pain near the anus) TENDERNESS IN MOUTH AND THROAT WITH OR WITHOUT PRESENCE OF ULCERS (sore throat, sores in mouth, or a toothache) UNUSUAL RASH, SWELLING OR PAIN  UNUSUAL VAGINAL DISCHARGE OR ITCHING   Items with * indicate a potential emergency and should be followed up as soon as possible or go to the Emergency Department if any problems should occur.  Please show the CHEMOTHERAPY ALERT CARD or IMMUNOTHERAPY ALERT CARD at check-in to the Emergency Department and triage nurse.  Should you have questions after your visit  or need to cancel or reschedule your appointment, please contact [facility] [place] [facility]  [phone] and follow the prompts.  Office hours are 8:00 a.m. to 4:30 p.m. Monday - Friday. Please note that voicemails left after 4:00 p.m. may not be returned until the following business day.  We are closed weekends and major holidays. You have access to a nurse at all times for urgent questions. Please call the main number to the clinic [phone] and follow the prompts.  For any non-urgent questions, you may also contact your provider using MyChart. We now offer e-Visits for anyone 18 and older to request care online for non-urgent symptoms. For details visit [email or web address].   Also download the MyChart app! Go to the app store, search "MyChart", open the app, select , and log in with your MyChart username and password.    

## 2022-08-08 NOTE — Addendum Note (Signed)
Addended byMichaelyn Barter on: 08/08/2022 10:41 AM   Modules accepted: Orders

## 2022-08-09 ENCOUNTER — Other Ambulatory Visit: Payer: Self-pay

## 2022-08-09 ENCOUNTER — Inpatient Hospital Stay: Payer: 59

## 2022-08-09 ENCOUNTER — Encounter: Payer: Self-pay | Admitting: Internal Medicine

## 2022-08-09 MED ORDER — BUSPIRONE HCL 5 MG PO TABS: 5.0000 mg | ORAL_TABLET | Freq: Two times a day (BID) | ORAL | 1 refills | Status: DC | PRN

## 2022-08-09 NOTE — Progress Notes (Signed)
Nutrition Follow-up:  Patient with stage 1a left breast cancer.  Patient receiving taxol and kanjinti.    Spoke with patient via phone for nutrition follow-up.  Patient reports that Thursday seems to be her worse day after receiving treatment on Mondays.  Experiences fatigue and diarrhea.  Planning on taking imodium for diarrhea this cycle.  Has been able to order [employer] 1.4 shakes and has been drinking.    Medications: reviewed  Labs: glucose 124, calcium 8.6  Anthropometrics:   Weight 113 lb 6.4 oz on 5/13 113 lb 9.6 oz on 4/23 118 lb on 06/28/21  NUTRITION DIAGNOSIS: Altered GI function related to cancer related treatment side effects as evidenced by diarrhea.      INTERVENTION:  Encouraged taking antidiarrheal to help control symptoms Continue [employer] 1.4 shakes for added calories and protein Continue good source of protein.     MONITORING, EVALUATION, GOAL: weight trends, intake   NEXT VISIT: Tuesday, May 28 during infusion  Rayansh Herbst B. Freida Busman, RD, LDN Registered Dietitian [phone]

## 2022-08-10 ENCOUNTER — Other Ambulatory Visit: Payer: Self-pay | Admitting: *Deleted

## 2022-08-10 DIAGNOSIS — C50912 Malignant neoplasm of unspecified site of left female breast: Secondary | ICD-10-CM

## 2022-08-10 MED ORDER — LIDOCAINE-PRILOCAINE 2.5-2.5 % EX CREA
TOPICAL_CREAM | CUTANEOUS | 3 refills | Status: DC
Start: 2022-08-10 — End: 2023-08-23

## 2022-08-12 ENCOUNTER — Encounter: Payer: Self-pay | Admitting: Nurse Practitioner

## 2022-08-12 MED FILL — Dexamethasone Sodium Phosphate Inj 100 MG/10ML: INTRAMUSCULAR | Qty: 1 | Status: AC

## 2022-08-15 ENCOUNTER — Other Ambulatory Visit: Payer: Self-pay | Admitting: *Deleted

## 2022-08-15 ENCOUNTER — Other Ambulatory Visit: Payer: Self-pay

## 2022-08-15 ENCOUNTER — Inpatient Hospital Stay: Payer: 59

## 2022-08-15 ENCOUNTER — Encounter: Payer: Self-pay | Admitting: Internal Medicine

## 2022-08-15 ENCOUNTER — Inpatient Hospital Stay (HOSPITAL_BASED_OUTPATIENT_CLINIC_OR_DEPARTMENT_OTHER): Payer: 59 | Admitting: Internal Medicine

## 2022-08-15 VITALS — BP 126/71 | HR 95 | Temp 97.8°F | Wt 113.6 lb

## 2022-08-15 DIAGNOSIS — C50912 Malignant neoplasm of unspecified site of left female breast: Secondary | ICD-10-CM

## 2022-08-15 DIAGNOSIS — Z5111 Encounter for antineoplastic chemotherapy: Secondary | ICD-10-CM | POA: Diagnosis not present

## 2022-08-15 DIAGNOSIS — K521 Toxic gastroenteritis and colitis: Secondary | ICD-10-CM

## 2022-08-15 DIAGNOSIS — K1231 Oral mucositis (ulcerative) due to antineoplastic therapy: Secondary | ICD-10-CM | POA: Diagnosis not present

## 2022-08-15 DIAGNOSIS — Z5112 Encounter for antineoplastic immunotherapy: Secondary | ICD-10-CM | POA: Diagnosis not present

## 2022-08-15 DIAGNOSIS — D0512 Intraductal carcinoma in situ of left breast: Secondary | ICD-10-CM | POA: Diagnosis not present

## 2022-08-15 DIAGNOSIS — T451X5A Adverse effect of antineoplastic and immunosuppressive drugs, initial encounter: Secondary | ICD-10-CM

## 2022-08-15 DIAGNOSIS — Z171 Estrogen receptor negative status [ER-]: Secondary | ICD-10-CM

## 2022-08-15 LAB — CBC WITH DIFFERENTIAL (CANCER CENTER ONLY)
Abs Immature Granulocytes: 0.02 10*3/uL (ref 0.00–0.07)
Basophils Absolute: 0.1 10*3/uL (ref 0.0–0.1)
Basophils Relative: 2 %
Eosinophils Absolute: 0.2 10*3/uL (ref 0.0–0.5)
Eosinophils Relative: 3 %
HCT: 36.9 % (ref 36.0–46.0)
Hemoglobin: 12.4 g/dL (ref 12.0–15.0)
Immature Granulocytes: 0 %
Lymphocytes Relative: 36 %
Lymphs Abs: 1.8 10*3/uL (ref 0.7–4.0)
MCH: 29.7 pg (ref 26.0–34.0)
MCHC: 33.6 g/dL (ref 30.0–36.0)
MCV: 88.3 fL (ref 80.0–100.0)
Monocytes Absolute: 0.3 10*3/uL (ref 0.1–1.0)
Monocytes Relative: 6 %
Neutro Abs: 2.7 10*3/uL (ref 1.7–7.7)
Neutrophils Relative %: 53 %
Platelet Count: 371 10*3/uL (ref 150–400)
RBC: 4.18 MIL/uL (ref 3.87–5.11)
RDW: 13.2 % (ref 11.5–15.5)
WBC Count: 5.1 10*3/uL (ref 4.0–10.5)
nRBC: 0 % (ref 0.0–0.2)

## 2022-08-15 LAB — CMP (CANCER CENTER ONLY)
ALT: 19 U/L (ref 0–44)
AST: 22 U/L (ref 15–41)
Albumin: 4 g/dL (ref 3.5–5.0)
Alkaline Phosphatase: 47 U/L (ref 38–126)
Anion gap: 9 (ref 5–15)
BUN: 18 mg/dL (ref 8–23)
CO2: 24 mmol/L (ref 22–32)
Calcium: 8.9 mg/dL (ref 8.9–10.3)
Chloride: 103 mmol/L (ref 98–111)
Creatinine: 0.8 mg/dL (ref 0.44–1.00)
GFR, Estimated: >60 mL/min (ref >60)
Glucose, Bld: 125 mg/dL — ABNORMAL HIGH (ref 70–99)
Potassium: 3.7 mmol/L (ref 3.5–5.1)
Sodium: 136 mmol/L (ref 135–145)
Total Bilirubin: 0.5 mg/dL (ref 0.3–1.2)
Total Protein: 6.8 g/dL (ref 6.5–8.1)

## 2022-08-15 MED ORDER — ACETAMINOPHEN 325 MG PO TABS
650.0000 mg | ORAL_TABLET | Freq: Once | ORAL | Status: AC
  Administered 2022-08-15: 650 mg via ORAL
  Filled 2022-08-15: qty 2

## 2022-08-15 MED ORDER — FAMOTIDINE 20 MG IN NS 100 ML IVPB
20.0000 mg | Freq: Two times a day (BID) | INTRAVENOUS | Status: DC
  Administered 2022-08-15: 20 mg via INTRAVENOUS
  Filled 2022-08-15: qty 20

## 2022-08-15 MED ORDER — STERILE WATER FOR INJECTION IJ SOLN
5.0000 mL | Freq: Four times a day (QID) | OROMUCOSAL | 0 refills | Status: DC | PRN
  Filled 2022-08-15: qty 300, 15d supply, fill #0

## 2022-08-15 MED ORDER — HEPARIN SOD (PORK) LOCK FLUSH 100 UNIT/ML IV SOLN
INTRAVENOUS | Status: AC
  Administered 2022-08-15: 500 [IU]
  Filled 2022-08-15: qty 5

## 2022-08-15 MED ORDER — DIPHENHYDRAMINE HCL 50 MG/ML IJ SOLN
50.0000 mg | Freq: Once | INTRAMUSCULAR | Status: AC
  Administered 2022-08-15: 50 mg via INTRAVENOUS
  Filled 2022-08-15: qty 1

## 2022-08-15 MED ORDER — FAMOTIDINE IN NACL 20-0.9 MG/50ML-% IV SOLN: 20.0000 mg | Freq: Once | INTRAVENOUS | Status: DC

## 2022-08-15 MED ORDER — SODIUM CHLORIDE 0.9 % IV SOLN
Freq: Once | INTRAVENOUS | Status: AC
  Filled 2022-08-15: qty 250

## 2022-08-15 MED ORDER — SODIUM CHLORIDE 0.9 % IV SOLN
10.0000 mg | Freq: Once | INTRAVENOUS | Status: AC
  Administered 2022-08-15: 10 mg via INTRAVENOUS
  Filled 2022-08-15: qty 10

## 2022-08-15 MED ORDER — DICYCLOMINE HCL 10 MG PO CAPS
10.0000 mg | ORAL_CAPSULE | Freq: Three times a day (TID) | ORAL | 0 refills | Status: DC | PRN
  Filled 2022-08-15: qty 30, 10d supply, fill #0

## 2022-08-15 MED ORDER — SODIUM CHLORIDE 0.9 % IV SOLN
80.0000 mg/m2 | Freq: Once | INTRAVENOUS | Status: AC
  Administered 2022-08-15: 120 mg via INTRAVENOUS
  Filled 2022-08-15: qty 20

## 2022-08-15 MED ORDER — TRASTUZUMAB-ANNS CHEMO 150 MG IV SOLR
2.0000 mg/kg | Freq: Once | INTRAVENOUS | Status: AC
  Administered 2022-08-15: 105 mg via INTRAVENOUS
  Filled 2022-08-15: qty 5

## 2022-08-15 MED ORDER — HEPARIN SOD (PORK) LOCK FLUSH 100 UNIT/ML IV SOLN
500.0000 [IU] | Freq: Once | INTRAVENOUS | Status: AC | PRN
  Filled 2022-08-15: qty 5

## 2022-08-15 NOTE — Patient Instructions (Signed)
[facility] [place] [facility]  Discharge Instructions: Thank you for choosing [facility] to provide your oncology and hematology care.  If you have a lab appointment with the Cancer Center, please go directly to the Cancer Center and check in at the registration area.  Wear comfortable clothing and clothing appropriate for easy access to any Portacath or PICC line.   We strive to give you quality time with your provider. You may need to reschedule your appointment if you arrive late (15 or more minutes).  Arriving late affects you and other patients whose appointments are after yours.  Also, if you miss three or more appointments without notifying the office, you may be dismissed from the clinic at the provider's discretion.      For prescription refill requests, have your pharmacy contact our office and allow 72 hours for refills to be completed.    Today you received the following chemotherapy and/or immunotherapy agents Kanjinti, Taxol      To help prevent nausea and vomiting after your treatment, we encourage you to take your nausea medication as directed.  BELOW ARE SYMPTOMS THAT SHOULD BE REPORTED IMMEDIATELY: *FEVER GREATER THAN 100.4 F (38 C) OR HIGHER *CHILLS OR SWEATING *NAUSEA AND VOMITING THAT IS NOT CONTROLLED WITH YOUR NAUSEA MEDICATION *UNUSUAL SHORTNESS OF BREATH *UNUSUAL BRUISING OR BLEEDING *URINARY PROBLEMS (pain or burning when urinating, or frequent urination) *BOWEL PROBLEMS (unusual diarrhea, constipation, pain near the anus) TENDERNESS IN MOUTH AND THROAT WITH OR WITHOUT PRESENCE OF ULCERS (sore throat, sores in mouth, or a toothache) UNUSUAL RASH, SWELLING OR PAIN  UNUSUAL VAGINAL DISCHARGE OR ITCHING   Items with * indicate a potential emergency and should be followed up as soon as possible or go to the Emergency Department if any problems should occur.  Please show the CHEMOTHERAPY ALERT CARD or IMMUNOTHERAPY ALERT CARD at  check-in to the Emergency Department and triage nurse.  Should you have questions after your visit or need to cancel or reschedule your appointment, please contact [facility] [place] [facility]  [phone] and follow the prompts.  Office hours are 8:00 a.m. to 4:30 p.m. Monday - Friday. Please note that voicemails left after 4:00 p.m. may not be returned until the following business day.  We are closed weekends and major holidays. You have access to a nurse at all times for urgent questions. Please call the main number to the clinic [phone] and follow the prompts.  For any non-urgent questions, you may also contact your provider using MyChart. We now offer e-Visits for anyone 18 and older to request care online for non-urgent symptoms. For details visit [email or web address].   Also download the MyChart app! Go to the app store, search "MyChart", open the app, select [facility], and log in with your MyChart username and password.    

## 2022-08-15 NOTE — Progress Notes (Signed)
[facility]  Patient Care Team: Lorre Munroe, NP as PCP - General (Internal Medicine) Hulen Luster, RN as Oncology Nurse Navigator   CANCER STAGING   Cancer Staging  Breast cancer [facility]) Staging form: Breast, AJCC 8th Edition - Clinical stage from 05/17/2022: Stage IA (cT1b, cN0, cM0, G3, ER-, PR-, HER2+) - Signed by Michaelyn Barter, MD on 05/27/2022 Stage prefix: Initial diagnosis Histologic grading system: 3 grade system  Current treatment Weekly paclitaxel with Herceptin (APT regimen) started on 07/18/2022  ASSESSMENT & PLAN:  Jennifer Garrison 62 y.o. female with pmh of osteoporosis and anxiety was referred to medical oncology for management of left breast stage IA invasive ductal cancer.  # Left breast IDC stage IA, ER/PR negative, HER2 positive -Patient felt a palpable mass in her left breast.   -S/p left breast lumpectomy with SLNB by Dr. Tonna Boehringer on 06/09/2022. Pathology showed 1.4 cm IDC, grade 3, DCIS present grade 2-3 with comedonecrosis, 0/3 lymph nodes negative, margins negative, pT1c.  ER/PR negative.  HER2 positive  -Echocardiogram done on 07/14/2022 showed EF of 50 to 55%.  No regional wall motion abnormalities.  -Patient seen today prior to cycle 2 day 1 of Taxol and Herceptin.  Labs reviewed and acceptable for treatment.  Will proceed.  # Chemotherapy related diarrhea -Starts 2 days after treatment lasting about 4 days.  Associated with abdominal cramping.  At times 9/10.  Denies any blood -Continue with Imodium 2 mg every 4-6 hours as needed. -Will add dicyclomine 10 mg 3 times daily as needed for abdominal cramping.  # Anxiety -Worsened with the need of chemotherapy. -Continue with Ativan 0.5 mg every 8 hours as needed.  On Wellbutrin.  # Decreased appetite -Started on [employer] supplements.  Follows with nutrition.  # Chemo induced mucositis -Continue with Magic mouthwash as needed.  # Evaluated by genetics-testing negative  #  Access-port.  Placed by Dr. Tonna Boehringer on 06/30/2022.  # Osteoporosis -On alendronate  No orders of the defined types were placed in this encounter.  RTC in 1 week for MD visit, labs, cycle 2 day 8 of Taxol and Herceptin.  The total time spent in the appointment was 30 minutes encounter with patients including review of chart and various tests results, discussions about plan of care and coordination of care plan   All questions were answered. The patient knows to call the clinic with any problems, questions or concerns. No barriers to learning was detected.  Michaelyn Barter, MD 5/20/202410:55 AM   HISTORY OF PRESENTING ILLNESS:  Jennifer Garrison 62 y.o. female with pmh of osteoporosis on alendronate and anxiety was referred to medical oncology for further management of stage Ia left breast invasive ductal cancer HER2 positive  Patient felt a palpable mass in her left breast mid February 2024.  She sought medical attention from PCP.  Further imaging as below.  INTERVAL HISTORY- Patient was seen today accompanied by husband prior to cycle 2 day 1 of Taxol and Herceptin.  Diarrhea - Wed/Thurs taking imodium 5-6 times a day. Abdominal cramps rating 9-10. Eating and drinking. No nausea and vomiting. No blood in stools. Neuropathy- stable  Vaginal discharge resolved.  Using [employer] supplement drink. Requesting renewal for Magic mouthwash.  Patient reports that while she was walking her dog her heart rate increases to 150s.  Previously before starting chemo, her heart rate would go up to 147 148.  We discussed about monitoring.  It may be unusual to increase in her  heart rate from exertion.  I have reviewed her chart and materials related to her cancer extensively and collaborated history with the patient. Summary of oncologic history is as follows: Oncology History  Breast cancer (HCC)  05/11/2022 Mammogram   Diagnostic mammogram and ultrasound- FINDINGS: Full field and spot compression views of  the LEFT breast demonstrate a 1 cm irregular mass within the UPPER INNER LEFT breast, at the site of patient concern. The patient has retropectoral implants.   On physical exam, a firm palpable mass identified at the 11 o'clock position of the LEFT breast 8 cm from the nipple.   Targeted ultrasound is performed, showing a 0.9 x 0.8 x 1 cm irregular hypoechoic mass at the 11 o'clock position of the LEFT breast 8 cm from the nipple.   No abnormal LEFT axillary lymph nodes are noted.   IMPRESSION: 1. Suspicious 1 cm UPPER INNER LEFT breast mass. Tissue sampling is recommended. 2. No abnormal appearing LEFT axillary lymph nodes.   05/17/2022 Cancer Staging   Staging form: Breast, AJCC 8th Edition - Clinical stage from 05/17/2022: Stage IA (cT1b, cN0, cM0, G3, ER-, PR-, HER2+) - Signed by Michaelyn Barter, MD on 05/27/2022 Stage prefix: Initial diagnosis Histologic grading system: 3 grade system   05/17/2022 Initial Biopsy   DIAGNOSIS:  A. BREAST, LEFT, 11:00, 8 CM FROM NIPPLE, ULTRASOUND-GUIDED BIOPSY:   - INVASIVE MAMMARY CARCINOMA, NOS DUCTAL.   Size of invasive carcinoma: 10 mm in this sample  Histologic grade of invasive carcinoma: Grade 3                       Glandular/tubular differentiation score: 3                       Nuclear pleomorphism score: 3                       Mitotic rate score: 2                       Total score: 8  Ductal carcinoma in situ: Not identified  Lymphovascular invasion: Not identified   Estrogen Receptor (ER) Status: NEGATIVE (LESS THAN 1%)   Progesterone Receptor (PgR) Status: NEGATIVE (LESS THAN 1%)   HER2 (by immunohistochemistry): POSITIVE (Score 3+)       Percentage of cells with uniform intense complete membrane  staining: 80%  Ki-67: Not performed     Genetic Testing   No pathogenic variants identified on the [facility] Multi-Cancer+RNA panel. VUS in MUTYH called c.1484G>A identified. The report date is 06/09/2022.  The Multi-Cancer + RNA  Panel offered by [facility] includes sequencing and/or deletion/duplication analysis of the following 70 genes:  AIP*, ALK, APC*, ATM*, AXIN2*, BAP1*, BARD1*, BLM*, BMPR1A*, BRCA1*, BRCA2*, BRIP1*, CDC73*, CDH1*, CDK4, CDKN1B*, CDKN2A, CHEK2*, CTNNA1*, DICER1*, EPCAM, EGFR, FH*, FLCN*, GREM1, HOXB13, KIT, LZTR1, MAX*, MBD4, MEN1*, MET, MITF, MLH1*, MSH2*, MSH3*, MSH6*, MUTYH*, NF1*, NF2*, NTHL1*, PALB2*, PDGFRA, PMS2*, POLD1*, POLE*, POT1*, PRKAR1A*, PTCH1*, PTEN*, RAD51C*, RAD51D*, RB1*, RET, SDHA*, SDHAF2*, SDHB*, SDHC*, SDHD*, SMAD4*, SMARCA4*, SMARCB1*, SMARCE1*, STK11*, SUFU*, TMEM127*, TP53*, TSC1*, TSC2*, VHL*. RNA analysis is performed for * genes.   06/09/2022 Surgery   S/p left breast lumpectomy with SLNB by Dr. Tonna Boehringer  Pathology showed 1.4 cm IDC, grade 3, DCIS present grade 2-3 with comedonecrosis, 0/3 lymph nodes negative, margins negative, pT1c.  ER/PR negative.  HER2 positive   07/18/2022 -  Chemotherapy  Patient is on Treatment Plan : BREAST Paclitaxel + Trastuzumab q7d / Trastuzumab q21d      Menarche age 81 or 34 Age at first birth 45 Used birth control pills and Depo more than 5 years Menopause was age 61 Hysterectomy yes for prolapsed uterus HRT no History of breast biopsies yes for breast cyst in left lower Family history of lung cancer in mother who was smoker  MEDICAL HISTORY:  Past Medical History:  Diagnosis Date   Allergy    Anxiety    Arthritis    Breast cancer (HCC) 05/27/2022   IBS (irritable bowel syndrome)    Trigger finger    Left Middle Finger 11/2016    SURGICAL HISTORY: Past Surgical History:  Procedure Laterality Date   ABDOMINAL HYSTERECTOMY  02/2016   Only cervix remains (done for prolapse)   AUGMENTATION MAMMAPLASTY Bilateral 2010   BREAST BIOPSY Left 05/17/2022   [place] BX, ribbon clip, path pending   BREAST BIOPSY Left 05/17/2022   [place] LT BREAST BX W LOC DEV 1ST LESION IMG BX SPEC US GUIDE 05/17/2022 [facility]   BREAST CYST ASPIRATION Left  2010   neg   BREAST ENHANCEMENT SURGERY  1998   CHOLECYSTECTOMY     COLONOSCOPY WITH PROPOFOL N/A 10/10/2019   Procedure: COLONOSCOPY WITH PROPOFOL;  Surgeon: Wyline Mood, MD;  [facility];  Service: Gastroenterology;  Laterality: N/A;   PARTIAL MASTECTOMY WITH AXILLARY SENTINEL LYMPH NODE BIOPSY Left 06/09/2022   Procedure: PARTIAL MASTECTOMY WITH AXILLARY SENTINEL LYMPH NODE BIOPSY;  Surgeon: Sung Amabile, DO;  Location: [facility] ORS;  Service: General;  Laterality: Left;   PORTACATH PLACEMENT N/A 06/30/2022   Procedure: INSERTION PORT-A-CATH;  Surgeon: Sung Amabile, DO;  Location: [facility] ORS;  Service: General;  Laterality: N/A;   TYMPANOPLASTY Left     SOCIAL HISTORY: Social History   Socioeconomic History   Marital status: Married    Spouse name: Onalee Hua   Number of children: 1   Years of education: Not on file   Highest education level: Not on file  Occupational History   Not on file  Tobacco Use   Smoking status: Former    Types: Cigarettes    Quit date: 09/09/1992    Years since quitting: 29.9   Smokeless tobacco: Current    Types: Chew   Tobacco comments:    Chewing tobacco   Vaping Use   Vaping Use: Former  Substance and Sexual Activity   Alcohol use: Not Currently   Drug use: Yes    Types: Marijuana    Comment: daily   Sexual activity: Yes    Birth control/protection: None  Other Topics Concern   Not on file  Social History Narrative   Not on file   Social Determinants of Health   Financial Resource Strain: Not on file  Food Insecurity: No Food Insecurity (05/27/2022)   Hunger Vital Sign    Worried About Running Out of Food in the Last Year: Never true    Ran Out of Food in the Last Year: Never true  Transportation Needs: No Transportation Needs (05/27/2022)   PRAPARE - Administrator, Civil Service (Medical): No    Lack of Transportation (Non-Medical): No  Physical Activity: Not on file  Stress: Not on file  Social Connections: Not on  file  Intimate Partner Violence: Not At Risk (05/27/2022)   Humiliation, Afraid, Rape, and Kick questionnaire    Fear of Current or Ex-Partner: No    Emotionally Abused: No  Physically Abused: No    Sexually Abused: No    FAMILY HISTORY: Family History  Problem Relation Age of Onset   Lung cancer Mother 46   Hypertension Mother    Lupus Father    Leukemia Cousin 6   Breast cancer Neg Hx     ALLERGIES:  is allergic to poison ivy extract.  MEDICATIONS:  Current Outpatient Medications  Medication Sig Dispense Refill   ADVANCED FIBER COMPLEX PO Take 3 tablets by mouth daily. Gummies     alendronate (FOSAMAX) 70 MG tablet Take 70 mg by mouth once a week.     buPROPion (WELLBUTRIN SR) 150 MG 12 hr tablet TAKE 1 TABLET TWICE A DAY 180 tablet 0   busPIRone (BUSPAR) 5 MG tablet Take 1 tablet (5 mg total) by mouth 2 (two) times daily as needed. 180 tablet 1   dicyclomine (BENTYL) 10 MG capsule Take 1 capsule (10 mg total) by mouth 3 (three) times daily as needed for up to 10 days for spasms. 30 capsule 0   HYDROcodone-acetaminophen (NORCO) 5-325 MG tablet Take 1 tablet by mouth every 6 (six) hours as needed for up to 6 doses for moderate pain. 6 tablet 0   ibuprofen (ADVIL) 600 MG tablet Take 1 tablet (600 mg total) by mouth every 6 (six) hours as needed. 30 tablet 0   lidocaine-prilocaine (EMLA) cream Apply to affected area once 30 g 3   loperamide (IMODIUM) 2 MG capsule Take 1 capsule (2 mg total) by mouth every 4 (four) hours as needed for diarrhea or loose stools. Every 4-6 hours as needed 30 capsule 0   LORazepam (ATIVAN) 0.5 MG tablet Take 1 tablet (0.5 mg total) by mouth every 8 (eight) hours. 30 tablet 0   magic mouthwash w/lidocaine SOLN Take 5 mLs by mouth 4 (four) times daily. 480 mL 3   metroNIDAZOLE (METROCREAM) 0.75 % cream Apply topically as needed. (Patient taking differently: Apply 1 Application topically as needed.)     ondansetron (ZOFRAN) 8 MG tablet Take 1 tablet (8 mg  total) by mouth every 8 (eight) hours as needed for nausea or vomiting. 30 tablet 1   OVER THE COUNTER MEDICATION 1.5 tablets in the morning and at bedtime. Calcium Bone Strength Supplement1     OVER THE COUNTER MEDICATION 2 tablets daily. Vitex Berry     OVER THE COUNTER MEDICATION in the morning, at noon, and at bedtime. Milk [employer]le Seeds Tea     prochlorperazine (COMPAZINE) 10 MG tablet Take 1 tablet (10 mg total) by mouth every 6 (six) hours as needed for nausea or vomiting. 30 tablet 1   triamcinolone cream (KENALOG) 0.1 % Apply 1 Application topically as needed.     magic mouthwash (multi-ingredient) oral suspension Take 5 mLs by mouth 4 (four) times daily as needed. 300 mL 0   No current facility-administered medications for this visit.   Facility-Administered Medications Ordered in Other Visits  Medication Dose Route Frequency Provider Last Rate Last Admin   famotidine (PEPCID) IVPB 20 mg in NS 100 mL IVPB  20 mg Intravenous Q12H Michaelyn Barter, MD       heparin lock flush 100 unit/mL  500 Units Intracatheter Once PRN Michaelyn Barter, MD       PACLitaxel (TAXOL) 120 mg in sodium chloride 0.9 % 250 mL chemo infusion (</= 80mg /m2)  80 mg/m2 (Treatment Plan Recorded) Intravenous Once Michaelyn Barter, MD       trastuzumab-anns [facility]) 105 mg in sodium chloride 0.9 %  250 mL chemo infusion  2 mg/kg (Treatment Plan Recorded) Intravenous Once Michaelyn Barter, MD        REVIEW OF SYSTEMS:   Pertinent information mentioned in HPI All other systems were reviewed with the patient and are negative.  PHYSICAL EXAMINATION: ECOG PERFORMANCE STATUS: 0 - Asymptomatic  Vitals:   08/15/22 0936  BP: 126/71  Pulse: 95  Temp: 97.8 F (36.6 C)  SpO2: 99%       Filed Weights   08/15/22 0936  Weight: 113 lb 9.6 oz (51.5 kg)       GENERAL:alert, no distress and comfortable SKIN: skin color, texture, turgor are normal, no rashes or significant lesions EYES: normal, conjunctiva are pink  and non-injected, sclera clear OROPHARYNX:no exudate, no erythema and lips, buccal mucosa, and tongue normal  NECK: supple, thyroid normal size, non-tender, without nodularity LYMPH:  no palpable lymphadenopathy in the cervical, axillary or inguinal LUNGS: clear to auscultation and percussion with normal breathing effort HEART: regular rate & rhythm and no murmurs and no lower extremity edema ABDOMEN:abdomen soft, non-tender and normal bowel sounds Musculoskeletal:no cyanosis of digits and no clubbing  PSYCH: alert & oriented x 3 with fluent speech NEURO: no focal motor/sensory deficits  LABORATORY DATA:  I have reviewed the data as listed Lab Results  Component Value Date   WBC 5.1 08/15/2022   HGB 12.4 08/15/2022   HCT 36.9 08/15/2022   MCV 88.3 08/15/2022   PLT 371 08/15/2022   Recent Labs    08/01/22 0937 08/08/22 0940 08/15/22 0908  NA 137 138 136  K 4.0 3.7 3.7  CL 103 106 103  CO2 26 24 24   GLUCOSE 130* 124* 125*  BUN 17 22 18   CREATININE 0.85 0.67 0.80  CALCIUM 9.2 8.6* 8.9  GFRNONAA >60 >60 >60  PROT 6.6 6.7 6.8  ALBUMIN 3.7 3.9 4.0  AST 22 21 22   ALT 24 17 19   ALKPHOS 52 41 47  BILITOT 0.6 0.6 0.5    RADIOGRAPHIC STUDIES: I have personally reviewed the radiological images as listed and agreed with the findings in the report. No results found.

## 2022-08-15 NOTE — Progress Notes (Signed)
Tick spray, is she able to use it.  Heart rate gets up above 153 when walking dog. She is hoping to start her radiation treatment on the 29th of July due to vacation.

## 2022-08-18 ENCOUNTER — Other Ambulatory Visit: Payer: Self-pay | Admitting: Internal Medicine

## 2022-08-18 NOTE — Telephone Encounter (Signed)
Requested Prescriptions  Pending Prescriptions Disp Refills   buPROPion (WELLBUTRIN SR) 150 MG 12 hr tablet [Pharmacy Med Name: BUPROPION HCL SR TABS 150MG ] 180 tablet 1    Sig: TAKE 1 TABLET TWICE A DAY     Psychiatry: Antidepressants - bupropion Passed - 08/18/2022 12:26 AM      Passed - Cr in normal range and within 360 days    Creatinine  Date Value Ref Range Status  08/15/2022 0.80 0.44 - 1.00 mg/dL Final   Creat  Date Value Ref Range Status  06/28/2021 0.88 0.50 - 1.05 mg/dL Final         Passed - AST in normal range and within 360 days    AST  Date Value Ref Range Status  08/15/2022 22 15 - 41 U/L Final         Passed - ALT in normal range and within 360 days    ALT  Date Value Ref Range Status  08/15/2022 19 0 - 44 U/L Final         Passed - Completed PHQ-2 or PHQ-9 in the last 360 days      Passed - Last BP in normal range    BP Readings from Last 1 Encounters:  08/15/22 126/71         Passed - Valid encounter within last 6 months    Recent Outpatient Visits           3 months ago Mass of upper inner quadrant of left breast   [place] [facility] [place], Salvadore Oxford, NP   7 months ago Age-related osteoporosis without current pathological fracture   [place] [facility] [place], Salvadore Oxford, NP   8 months ago Dermatitis due to plants, including poison ivy, sumac, and oak   [place] [facility] [place], Oswaldo Con[place], [place]   1 year ago Encounter for general adult medical examination with abnormal findings   [facility] [place], Salvadore Oxford, NP   1 year ago Sebaceous cyst   [place] [facility] [place], Salvadore Oxford, [place]

## 2022-08-19 MED FILL — Dexamethasone Sodium Phosphate Inj 100 MG/10ML: INTRAMUSCULAR | Qty: 1 | Status: AC

## 2022-08-23 ENCOUNTER — Inpatient Hospital Stay: Payer: 59

## 2022-08-23 ENCOUNTER — Inpatient Hospital Stay (HOSPITAL_BASED_OUTPATIENT_CLINIC_OR_DEPARTMENT_OTHER): Payer: 59 | Admitting: Internal Medicine

## 2022-08-23 ENCOUNTER — Encounter: Payer: Self-pay | Admitting: Internal Medicine

## 2022-08-23 VITALS — BP 115/72 | HR 89 | Temp 97.2°F | Wt 116.2 lb

## 2022-08-23 VITALS — BP 103/59 | HR 64 | Resp 16

## 2022-08-23 DIAGNOSIS — T451X5A Adverse effect of antineoplastic and immunosuppressive drugs, initial encounter: Secondary | ICD-10-CM | POA: Diagnosis not present

## 2022-08-23 DIAGNOSIS — C50912 Malignant neoplasm of unspecified site of left female breast: Secondary | ICD-10-CM

## 2022-08-23 DIAGNOSIS — K521 Toxic gastroenteritis and colitis: Secondary | ICD-10-CM

## 2022-08-23 DIAGNOSIS — Z171 Estrogen receptor negative status [ER-]: Secondary | ICD-10-CM

## 2022-08-23 DIAGNOSIS — D0512 Intraductal carcinoma in situ of left breast: Secondary | ICD-10-CM | POA: Diagnosis not present

## 2022-08-23 DIAGNOSIS — Z5111 Encounter for antineoplastic chemotherapy: Secondary | ICD-10-CM

## 2022-08-23 DIAGNOSIS — Z5112 Encounter for antineoplastic immunotherapy: Secondary | ICD-10-CM

## 2022-08-23 LAB — CMP (CANCER CENTER ONLY)
ALT: 33 U/L (ref 0–44)
AST: 31 U/L (ref 15–41)
Albumin: 3.8 g/dL (ref 3.5–5.0)
Alkaline Phosphatase: 40 U/L (ref 38–126)
Anion gap: 8 (ref 5–15)
BUN: 15 mg/dL (ref 8–23)
CO2: 24 mmol/L (ref 22–32)
Calcium: 8.5 mg/dL — ABNORMAL LOW (ref 8.9–10.3)
Chloride: 105 mmol/L (ref 98–111)
Creatinine: 0.61 mg/dL (ref 0.44–1.00)
GFR, Estimated: >60 mL/min (ref >60)
Glucose, Bld: 124 mg/dL — ABNORMAL HIGH (ref 70–99)
Potassium: 3.5 mmol/L (ref 3.5–5.1)
Sodium: 137 mmol/L (ref 135–145)
Total Bilirubin: 0.4 mg/dL (ref 0.3–1.2)
Total Protein: 6.7 g/dL (ref 6.5–8.1)

## 2022-08-23 LAB — CBC WITH DIFFERENTIAL (CANCER CENTER ONLY)
Abs Immature Granulocytes: 0.05 10*3/uL (ref 0.00–0.07)
Basophils Absolute: 0.1 10*3/uL (ref 0.0–0.1)
Basophils Relative: 1 %
Eosinophils Absolute: 0.2 10*3/uL (ref 0.0–0.5)
Eosinophils Relative: 3 %
HCT: 36.6 % (ref 36.0–46.0)
Hemoglobin: 12.1 g/dL (ref 12.0–15.0)
Immature Granulocytes: 1 %
Lymphocytes Relative: 26 %
Lymphs Abs: 1.5 10*3/uL (ref 0.7–4.0)
MCH: 29.7 pg (ref 26.0–34.0)
MCHC: 33.1 g/dL (ref 30.0–36.0)
MCV: 89.7 fL (ref 80.0–100.0)
Monocytes Absolute: 0.3 10*3/uL (ref 0.1–1.0)
Monocytes Relative: 6 %
Neutro Abs: 3.7 10*3/uL (ref 1.7–7.7)
Neutrophils Relative %: 63 %
Platelet Count: 315 10*3/uL (ref 150–400)
RBC: 4.08 MIL/uL (ref 3.87–5.11)
RDW: 13.9 % (ref 11.5–15.5)
WBC Count: 5.9 10*3/uL (ref 4.0–10.5)
nRBC: 0 % (ref 0.0–0.2)

## 2022-08-23 MED ORDER — SODIUM CHLORIDE 0.9 % IV SOLN
10.0000 mg | Freq: Once | INTRAVENOUS | Status: AC
  Administered 2022-08-23: 10 mg via INTRAVENOUS
  Filled 2022-08-23: qty 10

## 2022-08-23 MED ORDER — FAMOTIDINE IN NACL 20-0.9 MG/50ML-% IV SOLN
20.0000 mg | Freq: Once | INTRAVENOUS | Status: AC
  Administered 2022-08-23: 20 mg via INTRAVENOUS
  Filled 2022-08-23: qty 50

## 2022-08-23 MED ORDER — HEPARIN SOD (PORK) LOCK FLUSH 100 UNIT/ML IV SOLN
500.0000 [IU] | Freq: Once | INTRAVENOUS | Status: AC | PRN
  Administered 2022-08-23: 500 [IU]
  Filled 2022-08-23: qty 5

## 2022-08-23 MED ORDER — DIPHENHYDRAMINE HCL 50 MG/ML IJ SOLN
50.0000 mg | Freq: Once | INTRAMUSCULAR | Status: AC
  Administered 2022-08-23: 50 mg via INTRAVENOUS
  Filled 2022-08-23: qty 1

## 2022-08-23 MED ORDER — TRASTUZUMAB-ANNS CHEMO 150 MG IV SOLR
2.0000 mg/kg | Freq: Once | INTRAVENOUS | Status: AC
  Administered 2022-08-23: 105 mg via INTRAVENOUS
  Filled 2022-08-23: qty 5

## 2022-08-23 MED ORDER — SODIUM CHLORIDE 0.9 % IV SOLN
Freq: Once | INTRAVENOUS | Status: AC
  Filled 2022-08-23: qty 250

## 2022-08-23 MED ORDER — SODIUM CHLORIDE 0.9 % IV SOLN
80.0000 mg/m2 | Freq: Once | INTRAVENOUS | Status: AC
  Administered 2022-08-23: 120 mg via INTRAVENOUS
  Filled 2022-08-23: qty 20

## 2022-08-23 MED ORDER — ACETAMINOPHEN 325 MG PO TABS
650.0000 mg | ORAL_TABLET | Freq: Once | ORAL | Status: AC
  Administered 2022-08-23: 650 mg via ORAL
  Filled 2022-08-23: qty 2

## 2022-08-23 NOTE — Patient Instructions (Signed)
[place] [facility] AT [facility]  Discharge Instructions: Thank you for choosing [facility] to provide your oncology and hematology care.  If you have a lab appointment with the Cancer Center, please go directly to the Cancer Center and check in at the registration area.  Wear comfortable clothing and clothing appropriate for easy access to any Portacath or PICC line.   We strive to give you quality time with your provider. You may need to reschedule your appointment if you arrive late (15 or more minutes).  Arriving late affects you and other patients whose appointments are after yours.  Also, if you miss three or more appointments without notifying the office, you may be dismissed from the clinic at the provider's discretion.      For prescription refill requests, have your pharmacy contact our office and allow 72 hours for refills to be completed.    Today you received the following chemotherapy and/or immunotherapy agents Kanjinti & Taxol      To help prevent nausea and vomiting after your treatment, we encourage you to take your nausea medication as directed.  BELOW ARE SYMPTOMS THAT SHOULD BE REPORTED IMMEDIATELY: *FEVER GREATER THAN 100.4 F (38 C) OR HIGHER *CHILLS OR SWEATING *NAUSEA AND VOMITING THAT IS NOT CONTROLLED WITH YOUR NAUSEA MEDICATION *UNUSUAL SHORTNESS OF BREATH *UNUSUAL BRUISING OR BLEEDING *URINARY PROBLEMS (pain or burning when urinating, or frequent urination) *BOWEL PROBLEMS (unusual diarrhea, constipation, pain near the anus) TENDERNESS IN MOUTH AND THROAT WITH OR WITHOUT PRESENCE OF ULCERS (sore throat, sores in mouth, or a toothache) UNUSUAL RASH, SWELLING OR PAIN  UNUSUAL VAGINAL DISCHARGE OR ITCHING   Items with * indicate a potential emergency and should be followed up as soon as possible or go to the Emergency Department if any problems should occur.  Please show the CHEMOTHERAPY ALERT CARD or IMMUNOTHERAPY ALERT CARD at  check-in to the Emergency Department and triage nurse.  Should you have questions after your visit or need to cancel or reschedule your appointment, please contact [place] [facility] AT [facility]  [phone] and follow the prompts.  Office hours are 8:00 a.m. to 4:30 p.m. Monday - Friday. Please note that voicemails left after 4:00 p.m. may not be returned until the following business day.  We are closed weekends and major holidays. You have access to a nurse at all times for urgent questions. Please call the main number to the clinic [phone] and follow the prompts.  For any non-urgent questions, you may also contact your provider using MyChart. We now offer e-Visits for anyone 56 and older to request care online for non-urgent symptoms. For details visit [email or web address].   Also download the MyChart app! Go to the app store, search "MyChart", open the app, select [place], and log in with your MyChart username and password.

## 2022-08-23 NOTE — Progress Notes (Signed)
Nutrition Follow-up:  Patient with stage 1 a left breast cancer (ER/PR negative, HER2 +). Patient receiving taxol and kanjinti  Met with patient and husband during infusion.  Patient reports that appetite has been good.  Travelled to [place] this weekend and snacked more than usual.  Has been drinking [employer].  Had 2 episodes of diarrhea this am but manageable over the weekend.  Ate oatmeal with nuts, raisins and flaxseed for breakfast.  Has been eating good sources of protein (salmon, chicken, eggs, nuts, shakes).  Has not had issues with mouth sores.    Medications: MMW, bentyl, imodium, compazine, zofran  Labs: glucose 124, calcium 8.5  Anthropometrics:   Weight 116 lb 3.2 oz   113 lb on 5/20 113 lb on 4/23 118 lb on 4/3   NUTRITION DIAGNOSIS: Altered GI function continues but manageable    INTERVENTION:  Continue [employer] shakes for added nutrition Continue foods high in protein Reviewed foods to choose with diarrhea. Handout provided     MONITORING, EVALUATION, GOAL: weight trends, intake   NEXT VISIT: Tuesday, June 18 phone call  Jennifer Garrison, RD, LDN Registered Dietitian [phone]

## 2022-08-23 NOTE — Progress Notes (Signed)
Cancer Center CONSULT NOTE  Patient Care Team: Lorre Munroe, NP as PCP - General (Internal Medicine) Hulen Luster, RN as Oncology Nurse Navigator   CANCER STAGING   Cancer Staging  Breast cancer [facility]) Staging form: Breast, AJCC 8th Edition - Clinical stage from 05/17/2022: Stage IA (cT1b, cN0, cM0, G3, ER-, PR-, HER2+) - Signed by Michaelyn Barter, MD on 05/27/2022 Stage prefix: Initial diagnosis Histologic grading system: 3 grade system  Current treatment Weekly paclitaxel with Herceptin (APT regimen) started on 07/18/2022  ASSESSMENT & PLAN:  Jennifer Garrison 62 y.o. female with pmh of osteoporosis and anxiety was referred to medical oncology for management of left breast stage IA invasive ductal cancer.  # Left breast IDC stage IA, ER/PR negative, HER2 positive -Patient felt a palpable mass in her left breast.   -S/p left breast lumpectomy with SLNB by Dr. Tonna Boehringer on 06/09/2022. Pathology showed 1.4 cm IDC, grade 3, DCIS present grade 2-3 with comedonecrosis, 0/3 lymph nodes negative, margins negative, pT1c.  ER/PR negative.  HER2 positive  -Echocardiogram done on 07/14/2022 showed EF of 50 to 55%.  No regional wall motion abnormalities.  -Patient seen today prior to cycle 2 day 8 of Taxol and Herceptin.  Labs reviewed and acceptable for treatment.  Will proceed.  # Chemotherapy related diarrhea -Starts 2 days after treatment lasting about 4 days.  Associated with abdominal cramping.  At times 9/10.  Denies any blood -Continue with Imodium 2 mg every 4-6 hours as needed. -Dicyclomine 10 mg twice daily as needed helping with abdominal spasm.  # Anxiety -Worsened with the need of chemotherapy. -Continue with Ativan 0.5 mg every 8 hours as needed.  On Wellbutrin.  # Decreased appetite -Started on [employer] supplements.  Follows with nutrition.  # Chemo induced mucositis -Continue with Magic mouthwash as needed.  # Evaluated by genetics-testing negative  #  Access-port.  Placed by Dr. Tonna Boehringer on 06/30/2022.  # Osteoporosis -On alendronate  Orders Placed This Encounter  Procedures   CBC with Differential (Cancer Center Only)    Standing Status:   Future    Standing Expiration Date:   09/05/2023   CMP (Cancer Center only)    Standing Status:   Future    Standing Expiration Date:   09/05/2023   RTC in 1 week for MD visit, labs, cycle 2 day 15 of Taxol and Herceptin.  The total time spent in the appointment was 30 minutes encounter with patients including review of chart and various tests results, discussions about plan of care and coordination of care plan   All questions were answered. The patient knows to call the clinic with any problems, questions or concerns. No barriers to learning was detected.  Michaelyn Barter, MD 5/28/20243:44 PM   HISTORY OF PRESENTING ILLNESS:  Jennifer Garrison 62 y.o. female with pmh of osteoporosis on alendronate and anxiety was referred to medical oncology for further management of stage Ia left breast invasive ductal cancer HER2 positive  Patient felt a palpable mass in her left breast mid February 2024.  She sought medical attention from PCP.  Further imaging as below.  INTERVAL HISTORY- Patient was seen today accompanied by husband prior to cycle 2 day 1 of Taxol and Herceptin.  She is doing well with the treatment overall.  Denies any worsening neuropathy.  Diarrhea manageable with Imodium as needed.  She started dicyclomine took about 2 to 3 pills which help with abdominal spasm.  Denies any nausea or vomiting.  I  have reviewed her chart and materials related to her cancer extensively and collaborated history with the patient. Summary of oncologic history is as follows: Oncology History  Breast cancer (HCC)  05/11/2022 Mammogram   Diagnostic mammogram and ultrasound- FINDINGS: Full field and spot compression views of the LEFT breast demonstrate a 1 cm irregular mass within the UPPER INNER LEFT breast, at  the site of patient concern. The patient has retropectoral implants.   On physical exam, a firm palpable mass identified at the 11 o'clock position of the LEFT breast 8 cm from the nipple.   Targeted ultrasound is performed, showing a 0.9 x 0.8 x 1 cm irregular hypoechoic mass at the 11 o'clock position of the LEFT breast 8 cm from the nipple.   No abnormal LEFT axillary lymph nodes are noted.   IMPRESSION: 1. Suspicious 1 cm UPPER INNER LEFT breast mass. Tissue sampling is recommended. 2. No abnormal appearing LEFT axillary lymph nodes.   05/17/2022 Cancer Staging   Staging form: Breast, AJCC 8th Edition - Clinical stage from 05/17/2022: Stage IA (cT1b, cN0, cM0, G3, ER-, PR-, HER2+) - Signed by Michaelyn Barter, MD on 05/27/2022 Stage prefix: Initial diagnosis Histologic grading system: 3 grade system   05/17/2022 Initial Biopsy   DIAGNOSIS:  A. BREAST, LEFT, 11:00, 8 CM FROM NIPPLE, ULTRASOUND-GUIDED BIOPSY:   - INVASIVE MAMMARY CARCINOMA, NOS DUCTAL.   Size of invasive carcinoma: 10 mm in this sample  Histologic grade of invasive carcinoma: Grade 3                       Glandular/tubular differentiation score: 3                       Nuclear pleomorphism score: 3                       Mitotic rate score: 2                       Total score: 8  Ductal carcinoma in situ: Not identified  Lymphovascular invasion: Not identified   Estrogen Receptor (ER) Status: NEGATIVE (LESS THAN 1%)   Progesterone Receptor (PgR) Status: NEGATIVE (LESS THAN 1%)   HER2 (by immunohistochemistry): POSITIVE (Score 3+)       Percentage of cells with uniform intense complete membrane  staining: 80%  Ki-67: Not performed     Genetic Testing   No pathogenic variants identified on the [facility] Multi-Cancer+RNA panel. VUS in MUTYH called c.1484G>A identified. The report date is 06/09/2022.  The Multi-Cancer + RNA Panel offered by [facility] includes sequencing and/or deletion/duplication analysis of the  following 70 genes:  AIP*, ALK, APC*, ATM*, AXIN2*, BAP1*, BARD1*, BLM*, BMPR1A*, BRCA1*, BRCA2*, BRIP1*, CDC73*, CDH1*, CDK4, CDKN1B*, CDKN2A, CHEK2*, CTNNA1*, DICER1*, EPCAM, EGFR, FH*, FLCN*, GREM1, HOXB13, KIT, LZTR1, MAX*, MBD4, MEN1*, MET, MITF, MLH1*, MSH2*, MSH3*, MSH6*, MUTYH*, NF1*, NF2*, NTHL1*, PALB2*, PDGFRA, PMS2*, POLD1*, POLE*, POT1*, PRKAR1A*, PTCH1*, PTEN*, RAD51C*, RAD51D*, RB1*, RET, SDHA*, SDHAF2*, SDHB*, SDHC*, SDHD*, SMAD4*, SMARCA4*, SMARCB1*, SMARCE1*, STK11*, SUFU*, TMEM127*, TP53*, TSC1*, TSC2*, VHL*. RNA analysis is performed for * genes.   06/09/2022 Surgery   S/p left breast lumpectomy with SLNB by Dr. Tonna Boehringer  Pathology showed 1.4 cm IDC, grade 3, DCIS present grade 2-3 with comedonecrosis, 0/3 lymph nodes negative, margins negative, pT1c.  ER/PR negative.  HER2 positive   07/18/2022 -  Chemotherapy   Patient is on Treatment Plan :  BREAST Paclitaxel + Trastuzumab q7d / Trastuzumab q21d      Menarche age 79 or 24 Age at first birth 26 Used birth control pills and Depo more than 5 years Menopause was age 33 Hysterectomy yes for prolapsed uterus HRT no History of breast biopsies yes for breast cyst in left lower Family history of lung cancer in mother who was smoker  MEDICAL HISTORY:  Past Medical History:  Diagnosis Date   Allergy    Anxiety    Arthritis    Breast cancer (HCC) 05/27/2022   IBS (irritable bowel syndrome)    Trigger finger    Left Middle Finger 11/2016    SURGICAL HISTORY: Past Surgical History:  Procedure Laterality Date   ABDOMINAL HYSTERECTOMY  02/2016   Only cervix remains (done for prolapse)   AUGMENTATION MAMMAPLASTY Bilateral 2010   BREAST BIOPSY Left 05/17/2022   [place] BX, ribbon clip, path pending   BREAST BIOPSY Left 05/17/2022   [place] LT BREAST BX W LOC DEV 1ST LESION IMG BX SPEC US GUIDE 05/17/2022 [facility]   BREAST CYST ASPIRATION Left 2010   neg   BREAST ENHANCEMENT SURGERY  1998   CHOLECYSTECTOMY     COLONOSCOPY WITH  PROPOFOL N/A 10/10/2019   Procedure: COLONOSCOPY WITH PROPOFOL;  Surgeon: Wyline Mood, MD;  Location: [facility] ENDOSCOPY;  Service: Gastroenterology;  Laterality: N/A;   PARTIAL MASTECTOMY WITH AXILLARY SENTINEL LYMPH NODE BIOPSY Left 06/09/2022   Procedure: PARTIAL MASTECTOMY WITH AXILLARY SENTINEL LYMPH NODE BIOPSY;  Surgeon: Sung Amabile, DO;  Location: [facility] ORS;  Service: General;  Laterality: Left;   PORTACATH PLACEMENT N/A 06/30/2022   Procedure: INSERTION PORT-A-CATH;  Surgeon: Sung Amabile, DO;  Location: [facility] ORS;  Service: General;  Laterality: N/A;   TYMPANOPLASTY Left     SOCIAL HISTORY: Social History   Socioeconomic History   Marital status: Married    Spouse name: Onalee Hua   Number of children: 1   Years of education: Not on file   Highest education level: Not on file  Occupational History   Not on file  Tobacco Use   Smoking status: Former    Types: Cigarettes    Quit date: 09/09/1992    Years since quitting: 29.9   Smokeless tobacco: Current    Types: Chew   Tobacco comments:    Chewing tobacco   Vaping Use   Vaping Use: Former  Substance and Sexual Activity   Alcohol use: Not Currently   Drug use: Yes    Types: Marijuana    Comment: daily   Sexual activity: Yes    Birth control/protection: None  Other Topics Concern   Not on file  Social History Narrative   Not on file   Social Determinants of Health   Financial Resource Strain: Not on file  Food Insecurity: No Food Insecurity (05/27/2022)   Hunger Vital Sign    Worried About Running Out of Food in the Last Year: Never true    Ran Out of Food in the Last Year: Never true  Transportation Needs: No Transportation Needs (05/27/2022)   PRAPARE - Administrator, Civil Service (Medical): No    Lack of Transportation (Non-Medical): No  Physical Activity: Not on file  Stress: Not on file  Social Connections: Not on file  Intimate Partner Violence: Not At Risk (05/27/2022)   Humiliation, Afraid, Rape, and  Kick questionnaire    Fear of Current or Ex-Partner: No    Emotionally Abused: No    Physically Abused: No  Sexually Abused: No    FAMILY HISTORY: Family History  Problem Relation Age of Onset   Lung cancer Mother 3   Hypertension Mother    Lupus Father    Leukemia Cousin 6   Breast cancer Neg Hx     ALLERGIES:  is allergic to poison ivy extract.  MEDICATIONS:  Current Outpatient Medications  Medication Sig Dispense Refill   ADVANCED FIBER COMPLEX PO Take 3 tablets by mouth daily. Gummies     alendronate (FOSAMAX) 70 MG tablet Take 70 mg by mouth once a week.     buPROPion (WELLBUTRIN SR) 150 MG 12 hr tablet TAKE 1 TABLET TWICE A DAY 180 tablet 1   busPIRone (BUSPAR) 5 MG tablet Take 1 tablet (5 mg total) by mouth 2 (two) times daily as needed. 180 tablet 1   dicyclomine (BENTYL) 10 MG capsule Take 1 capsule (10 mg total) by mouth 3 (three) times daily as needed for up to 10 days for spasms. 30 capsule 0   HYDROcodone-acetaminophen (NORCO) 5-325 MG tablet Take 1 tablet by mouth every 6 (six) hours as needed for up to 6 doses for moderate pain. 6 tablet 0   ibuprofen (ADVIL) 600 MG tablet Take 1 tablet (600 mg total) by mouth every 6 (six) hours as needed. 30 tablet 0   lidocaine-prilocaine (EMLA) cream Apply to affected area once 30 g 3   loperamide (IMODIUM) 2 MG capsule Take 1 capsule (2 mg total) by mouth every 4 (four) hours as needed for diarrhea or loose stools. Every 4-6 hours as needed 30 capsule 0   LORazepam (ATIVAN) 0.5 MG tablet Take 1 tablet (0.5 mg total) by mouth every 8 (eight) hours. 30 tablet 0   magic mouthwash (multi-ingredient) oral suspension Take 5 mLs by mouth 4 (four) times daily as needed. 300 mL 0   magic mouthwash w/lidocaine SOLN Take 5 mLs by mouth 4 (four) times daily. 480 mL 3   metroNIDAZOLE (METROCREAM) 0.75 % cream Apply topically as needed. (Patient taking differently: Apply 1 Application topically as needed.)     ondansetron (ZOFRAN) 8 MG  tablet Take 1 tablet (8 mg total) by mouth every 8 (eight) hours as needed for nausea or vomiting. 30 tablet 1   OVER THE COUNTER MEDICATION 1.5 tablets in the morning and at bedtime. Calcium Bone Strength Supplement1     OVER THE COUNTER MEDICATION 2 tablets daily. Vitex Berry     OVER THE COUNTER MEDICATION in the morning, at noon, and at bedtime. Milk Thisle Seeds Tea     prochlorperazine (COMPAZINE) 10 MG tablet Take 1 tablet (10 mg total) by mouth every 6 (six) hours as needed for nausea or vomiting. 30 tablet 1   triamcinolone cream (KENALOG) 0.1 % Apply 1 Application topically as needed.     No current facility-administered medications for this visit.   Facility-Administered Medications Ordered in Other Visits  Medication Dose Route Frequency Provider Last Rate Last Admin   heparin lock flush 100 unit/mL  500 Units Intracatheter Once PRN Michaelyn Barter, MD       PACLitaxel (TAXOL) 120 mg in sodium chloride 0.9 % 250 mL chemo infusion (</= 80mg /m2)  80 mg/m2 (Treatment Plan Recorded) Intravenous Once Michaelyn Barter, MD       trastuzumab-anns [facility]) 105 mg in sodium chloride 0.9 % 250 mL chemo infusion  2 mg/kg (Treatment Plan Recorded) Intravenous Once Michaelyn Barter, MD 510 mL/hr at 08/23/22 1515 105 mg at 08/23/22 1515    REVIEW OF  SYSTEMS:   Pertinent information mentioned in HPI All other systems were reviewed with the patient and are negative.  PHYSICAL EXAMINATION: ECOG PERFORMANCE STATUS: 0 - Asymptomatic  Vitals:   08/23/22 1340  BP: 115/72  Pulse: 89  Temp: (!) 97.2 F (36.2 C)  SpO2: 96%       Filed Weights   08/23/22 1340  Weight: 116 lb 3.2 oz (52.7 kg)       GENERAL:alert, no distress and comfortable SKIN: skin color, texture, turgor are normal, no rashes or significant lesions EYES: normal, conjunctiva are pink and non-injected, sclera clear OROPHARYNX:no exudate, no erythema and lips, buccal mucosa, and tongue normal  NECK: supple, thyroid  normal size, non-tender, without nodularity LYMPH:  no palpable lymphadenopathy in the cervical, axillary or inguinal LUNGS: clear to auscultation and percussion with normal breathing effort HEART: regular rate & rhythm and no murmurs and no lower extremity edema ABDOMEN:abdomen soft, non-tender and normal bowel sounds Musculoskeletal:no cyanosis of digits and no clubbing  PSYCH: alert & oriented x 3 with fluent speech NEURO: no focal motor/sensory deficits  LABORATORY DATA:  I have reviewed the data as listed Lab Results  Component Value Date   WBC 5.9 08/23/2022   HGB 12.1 08/23/2022   HCT 36.6 08/23/2022   MCV 89.7 08/23/2022   PLT 315 08/23/2022   Recent Labs    08/08/22 0940 08/15/22 0908 08/23/22 1310  NA 138 136 137  K 3.7 3.7 3.5  CL 106 103 105  CO2 24 24 24   GLUCOSE 124* 125* 124*  BUN 22 18 15   CREATININE 0.67 0.80 0.61  CALCIUM 8.6* 8.9 8.5*  GFRNONAA >60 >60 >60  PROT 6.7 6.8 6.7  ALBUMIN 3.9 4.0 3.8  AST 21 22 31   ALT 17 19 33  ALKPHOS 41 47 40  BILITOT 0.6 0.5 0.4    RADIOGRAPHIC STUDIES: I have personally reviewed the radiological images as listed and agreed with the findings in the report. No results found.

## 2022-08-25 ENCOUNTER — Other Ambulatory Visit: Payer: Self-pay

## 2022-08-25 ENCOUNTER — Encounter: Payer: Self-pay | Admitting: Internal Medicine

## 2022-08-25 MED FILL — Dexamethasone Sodium Phosphate Inj 100 MG/10ML: INTRAMUSCULAR | Qty: 1 | Status: AC

## 2022-08-26 ENCOUNTER — Other Ambulatory Visit: Payer: Self-pay

## 2022-08-29 ENCOUNTER — Encounter (INDEPENDENT_AMBULATORY_CARE_PROVIDER_SITE_OTHER): Payer: Self-pay

## 2022-08-29 ENCOUNTER — Inpatient Hospital Stay: Payer: 59

## 2022-08-29 ENCOUNTER — Encounter: Payer: Self-pay | Admitting: Internal Medicine

## 2022-08-29 ENCOUNTER — Inpatient Hospital Stay: Payer: 59 | Attending: Internal Medicine | Admitting: Internal Medicine

## 2022-08-29 VITALS — BP 131/72 | HR 79 | Temp 97.6°F | Wt 115.8 lb

## 2022-08-29 DIAGNOSIS — Z5111 Encounter for antineoplastic chemotherapy: Secondary | ICD-10-CM | POA: Diagnosis present

## 2022-08-29 DIAGNOSIS — C50912 Malignant neoplasm of unspecified site of left female breast: Secondary | ICD-10-CM | POA: Diagnosis not present

## 2022-08-29 DIAGNOSIS — Z5112 Encounter for antineoplastic immunotherapy: Secondary | ICD-10-CM

## 2022-08-29 DIAGNOSIS — Z79899 Other long term (current) drug therapy: Secondary | ICD-10-CM | POA: Insufficient documentation

## 2022-08-29 DIAGNOSIS — Z171 Estrogen receptor negative status [ER-]: Secondary | ICD-10-CM | POA: Diagnosis not present

## 2022-08-29 DIAGNOSIS — K1231 Oral mucositis (ulcerative) due to antineoplastic therapy: Secondary | ICD-10-CM | POA: Insufficient documentation

## 2022-08-29 DIAGNOSIS — D0512 Intraductal carcinoma in situ of left breast: Secondary | ICD-10-CM | POA: Insufficient documentation

## 2022-08-29 DIAGNOSIS — F419 Anxiety disorder, unspecified: Secondary | ICD-10-CM | POA: Insufficient documentation

## 2022-08-29 LAB — CBC WITH DIFFERENTIAL (CANCER CENTER ONLY)
Abs Immature Granulocytes: 0.04 10*3/uL (ref 0.00–0.07)
Basophils Absolute: 0.1 10*3/uL (ref 0.0–0.1)
Basophils Relative: 1 %
Eosinophils Absolute: 0.1 10*3/uL (ref 0.0–0.5)
Eosinophils Relative: 2 %
HCT: 36.7 % (ref 36.0–46.0)
Hemoglobin: 12.1 g/dL (ref 12.0–15.0)
Immature Granulocytes: 1 %
Lymphocytes Relative: 23 %
Lymphs Abs: 1.3 10*3/uL (ref 0.7–4.0)
MCH: 29.7 pg (ref 26.0–34.0)
MCHC: 33 g/dL (ref 30.0–36.0)
MCV: 90.2 fL (ref 80.0–100.0)
Monocytes Absolute: 0.3 10*3/uL (ref 0.1–1.0)
Monocytes Relative: 6 %
Neutro Abs: 3.8 10*3/uL (ref 1.7–7.7)
Neutrophils Relative %: 67 %
Platelet Count: 293 10*3/uL (ref 150–400)
RBC: 4.07 MIL/uL (ref 3.87–5.11)
RDW: 14 % (ref 11.5–15.5)
WBC Count: 5.8 10*3/uL (ref 4.0–10.5)
nRBC: 0 % (ref 0.0–0.2)

## 2022-08-29 LAB — CMP (CANCER CENTER ONLY)
ALT: 34 U/L (ref 0–44)
AST: 22 U/L (ref 15–41)
Albumin: 3.9 g/dL (ref 3.5–5.0)
Alkaline Phosphatase: 45 U/L (ref 38–126)
Anion gap: 7 (ref 5–15)
BUN: 20 mg/dL (ref 8–23)
CO2: 26 mmol/L (ref 22–32)
Calcium: 9.2 mg/dL (ref 8.9–10.3)
Chloride: 105 mmol/L (ref 98–111)
Creatinine: 0.88 mg/dL (ref 0.44–1.00)
GFR, Estimated: >60 mL/min (ref >60)
Glucose, Bld: 107 mg/dL — ABNORMAL HIGH (ref 70–99)
Potassium: 3.9 mmol/L (ref 3.5–5.1)
Sodium: 138 mmol/L (ref 135–145)
Total Bilirubin: 0.6 mg/dL (ref 0.3–1.2)
Total Protein: 6.7 g/dL (ref 6.5–8.1)

## 2022-08-29 MED ORDER — TRASTUZUMAB-ANNS CHEMO 150 MG IV SOLR
2.0000 mg/kg | Freq: Once | INTRAVENOUS | Status: AC
  Administered 2022-08-29: 105 mg via INTRAVENOUS
  Filled 2022-08-29: qty 5

## 2022-08-29 MED ORDER — DIPHENHYDRAMINE HCL 50 MG/ML IJ SOLN
50.0000 mg | Freq: Once | INTRAMUSCULAR | Status: AC
  Administered 2022-08-29: 50 mg via INTRAVENOUS
  Filled 2022-08-29: qty 1

## 2022-08-29 MED ORDER — FAMOTIDINE IN NACL 20-0.9 MG/50ML-% IV SOLN: 20.0000 mg | Freq: Once | INTRAVENOUS | Status: DC

## 2022-08-29 MED ORDER — FAMOTIDINE 20 MG IN NS 100 ML IVPB
20.0000 mg | Freq: Once | INTRAVENOUS | Status: AC
  Administered 2022-08-29: 20 mg via INTRAVENOUS
  Filled 2022-08-29: qty 100

## 2022-08-29 MED ORDER — SODIUM CHLORIDE 0.9 % IV SOLN
80.0000 mg/m2 | Freq: Once | INTRAVENOUS | Status: AC
  Administered 2022-08-29: 120 mg via INTRAVENOUS
  Filled 2022-08-29: qty 20

## 2022-08-29 MED ORDER — SODIUM CHLORIDE 0.9 % IV SOLN
Freq: Once | INTRAVENOUS | Status: AC
  Filled 2022-08-29: qty 250

## 2022-08-29 MED ORDER — HEPARIN SOD (PORK) LOCK FLUSH 100 UNIT/ML IV SOLN
500.0000 [IU] | Freq: Once | INTRAVENOUS | Status: AC | PRN
  Administered 2022-08-29: 500 [IU]
  Filled 2022-08-29: qty 5

## 2022-08-29 MED ORDER — ACETAMINOPHEN 325 MG PO TABS
650.0000 mg | ORAL_TABLET | Freq: Once | ORAL | Status: AC
  Administered 2022-08-29: 650 mg via ORAL
  Filled 2022-08-29: qty 2

## 2022-08-29 MED ORDER — SODIUM CHLORIDE 0.9 % IV SOLN
10.0000 mg | Freq: Once | INTRAVENOUS | Status: AC
  Administered 2022-08-29: 10 mg via INTRAVENOUS
  Filled 2022-08-29: qty 10

## 2022-08-29 NOTE — Patient Instructions (Signed)
[place] [facility] [place] [facility]  Discharge Instructions: Thank you for choosing [facility] to provide your oncology and hematology care.  If you have a lab appointment with the Cancer Center, please go directly to the Cancer Center and check in at the registration area.  Wear comfortable clothing and clothing appropriate for easy access to any Portacath or PICC line.   We strive to give you quality time with your provider. You may need to reschedule your appointment if you arrive late (15 or more minutes).  Arriving late affects you and other patients whose appointments are after yours.  Also, if you miss three or more appointments without notifying the office, you may be dismissed from the clinic at the provider's discretion.      For prescription refill requests, have your pharmacy contact our office and allow 72 hours for refills to be completed.     To help prevent nausea and vomiting after your treatment, we encourage you to take your nausea medication as directed.  BELOW ARE SYMPTOMS THAT SHOULD BE REPORTED IMMEDIATELY: *FEVER GREATER THAN 100.4 F (38 C) OR HIGHER *CHILLS OR SWEATING *NAUSEA AND VOMITING THAT IS NOT CONTROLLED WITH YOUR NAUSEA MEDICATION *UNUSUAL SHORTNESS OF BREATH *UNUSUAL BRUISING OR BLEEDING *URINARY PROBLEMS (pain or burning when urinating, or frequent urination) *BOWEL PROBLEMS (unusual diarrhea, constipation, pain near the anus) TENDERNESS IN MOUTH AND THROAT WITH OR WITHOUT PRESENCE OF ULCERS (sore throat, sores in mouth, or a toothache) UNUSUAL RASH, SWELLING OR PAIN  UNUSUAL VAGINAL DISCHARGE OR ITCHING   Items with * indicate a potential emergency and should be followed up as soon as possible or go to the Emergency Department if any problems should occur.  Please show the CHEMOTHERAPY ALERT CARD or IMMUNOTHERAPY ALERT CARD at check-in to the Emergency Department and triage nurse.  Should you have questions after your visit  or need to cancel or reschedule your appointment, please contact [place] [facility] [place] [facility]  [phone] and follow the prompts.  Office hours are 8:00 a.m. to 4:30 p.m. Monday - Friday. Please note that voicemails left after 4:00 p.m. may not be returned until the following business day.  We are closed weekends and major holidays. You have access to a nurse at all times for urgent questions. Please call the main number to the clinic [phone] and follow the prompts.  For any non-urgent questions, you may also contact your provider using MyChart. We now offer e-Visits for anyone 18 and older to request care online for non-urgent symptoms. For details visit [email or web address].   Also download the MyChart app! Go to the app store, search "MyChart", open the app, select , and log in with your MyChart username and password.    

## 2022-08-29 NOTE — Progress Notes (Signed)
Patient's spouse noticed that his wife was more tired and weak this week, and they have a question about the percentile rate for breast cancer recurring within so many years. Patient is also having problems with a Hemorid that is also keeping her from using the bathroom.

## 2022-08-29 NOTE — Progress Notes (Signed)
[facility] CONSULT NOTE  Patient Care Team: Lorre Munroe, NP as PCP - General (Internal Medicine) Hulen Luster, RN as Oncology Nurse Navigator   CANCER STAGING   Cancer Staging  Breast cancer [facility]) Staging form: Breast, AJCC 8th Edition - Clinical stage from 05/17/2022: Stage IA (cT1b, cN0, cM0, G3, ER-, PR-, HER2+) - Signed by Michaelyn Barter, MD on 05/27/2022 Stage prefix: Initial diagnosis Histologic grading system: 3 grade system  Current treatment Weekly paclitaxel with Herceptin (APT regimen) started on 07/18/2022  ASSESSMENT & PLAN:  Jennifer Garrison 62 y.o. female with pmh of osteoporosis and anxiety was referred to medical oncology for management of left breast stage IA invasive ductal cancer.  # Left breast IDC stage IA, ER/PR negative, HER2 positive -Patient felt a palpable mass in her left breast.   -S/p left breast lumpectomy with SLNB by Dr. Tonna Boehringer on 06/09/2022. Pathology showed 1.4 cm IDC, grade 3, DCIS present grade 2-3 with comedonecrosis, 0/3 lymph nodes negative, margins negative, pT1c.  ER/PR negative.  HER2 positive  -Echocardiogram done on 07/14/2022 showed EF of 50 to 55%.  No regional wall motion abnormalities.  -Patient seen today prior to cycle 2 day 15 of Taxol and Herceptin.  Labs reviewed and acceptable for treatment.  Will proceed.  # Abdominal spasm -for couple of days during chemo. Dicyclomine 10 mg twice daily as needed helping with abdominal spasm.  #Constipation  - taking colace. If needed, can add miralax   #Hemorrhoids - will get Anusol cream. Advised to take stool softners to keep BM soft.  - no bleeding . Using sitz bath  # Anxiety -Worsened with the need of chemotherapy. -Continue with Ativan 0.5 mg every 8 hours as needed.  On Wellbutrin.  # Decreased appetite -Started on [employer] supplements.  Follows with nutrition.  # Chemo induced mucositis -Continue with Magic mouthwash as needed.  # Evaluated by  genetics-testing negative  # Access-port.  Placed by Dr. Tonna Boehringer on 06/30/2022.  # Osteoporosis -On alendronate  Orders Placed This Encounter  Procedures   CBC with Differential (Cancer Center Only)    Standing Status:   Future    Standing Expiration Date:   09/12/2023   CMP (Cancer Center only)    Standing Status:   Future    Standing Expiration Date:   09/12/2023   RTC in 1 week for MD visit, labs, cycle 2 day 22 of Taxol and Herceptin.  The total time spent in the appointment was 30 minutes encounter with patients including review of chart and various tests results, discussions about plan of care and coordination of care plan   All questions were answered. The patient knows to call the clinic with any problems, questions or concerns. No barriers to learning was detected.  Michaelyn Barter, MD 6/3/20249:52 AM   HISTORY OF PRESENTING ILLNESS:  Jennifer Garrison 62 y.o. female with pmh of osteoporosis on alendronate and anxiety was referred to medical oncology for further management of stage Ia left breast invasive ductal cancer HER2 positive  Patient felt a palpable mass in her left breast mid February 2024.  She sought medical attention from PCP.  Further imaging as below.  INTERVAL HISTORY- Patient was seen today accompanied by husband prior to cycle 2 day 15 of Taxol and Herceptin.  She is doing well with the treatment overall.  Feels more fatigued today. Did weekend trip on memorial day weekend and could not rest enough. Diarrhea resolved. Now constipated and has hemorrhoids. No bleeding.  Using anusol wipes. Plans to get cream. Abdo spasm controlled with Bentyl. Reports connector on top of the port is visible. No nausea/vomiting.   No issues flushing or drawing blood. Skin around port is clean. No concerns for infection.   I have reviewed her chart and materials related to her cancer extensively and collaborated history with the patient. Summary of oncologic history is as  follows: Oncology History  Breast cancer (HCC)  05/11/2022 Mammogram   Diagnostic mammogram and ultrasound- FINDINGS: Full field and spot compression views of the LEFT breast demonstrate a 1 cm irregular mass within the UPPER INNER LEFT breast, at the site of patient concern. The patient has retropectoral implants.   On physical exam, a firm palpable mass identified at the 11 o'clock position of the LEFT breast 8 cm from the nipple.   Targeted ultrasound is performed, showing a 0.9 x 0.8 x 1 cm irregular hypoechoic mass at the 11 o'clock position of the LEFT breast 8 cm from the nipple.   No abnormal LEFT axillary lymph nodes are noted.   IMPRESSION: 1. Suspicious 1 cm UPPER INNER LEFT breast mass. Tissue sampling is recommended. 2. No abnormal appearing LEFT axillary lymph nodes.   05/17/2022 Cancer Staging   Staging form: Breast, AJCC 8th Edition - Clinical stage from 05/17/2022: Stage IA (cT1b, cN0, cM0, G3, ER-, PR-, HER2+) - Signed by Michaelyn Barter, MD on 05/27/2022 Stage prefix: Initial diagnosis Histologic grading system: 3 grade system   05/17/2022 Initial Biopsy   DIAGNOSIS:  A. BREAST, LEFT, 11:00, 8 CM FROM NIPPLE, ULTRASOUND-GUIDED BIOPSY:   - INVASIVE MAMMARY CARCINOMA, NOS DUCTAL.   Size of invasive carcinoma: 10 mm in this sample  Histologic grade of invasive carcinoma: Grade 3                       Glandular/tubular differentiation score: 3                       Nuclear pleomorphism score: 3                       Mitotic rate score: 2                       Total score: 8  Ductal carcinoma in situ: Not identified  Lymphovascular invasion: Not identified   Estrogen Receptor (ER) Status: NEGATIVE (LESS THAN 1%)   Progesterone Receptor (PgR) Status: NEGATIVE (LESS THAN 1%)   HER2 (by immunohistochemistry): POSITIVE (Score 3+)       Percentage of cells with uniform intense complete membrane  staining: 80%  Ki-67: Not performed     Genetic Testing   No  pathogenic variants identified on the [facility] Multi-Cancer+RNA panel. VUS in MUTYH called c.1484G>A identified. The report date is 06/09/2022.  The Multi-Cancer + RNA Panel offered by [facility] includes sequencing and/or deletion/duplication analysis of the following 70 genes:  AIP*, ALK, APC*, ATM*, AXIN2*, BAP1*, BARD1*, BLM*, BMPR1A*, BRCA1*, BRCA2*, BRIP1*, CDC73*, CDH1*, CDK4, CDKN1B*, CDKN2A, CHEK2*, CTNNA1*, DICER1*, EPCAM, EGFR, FH*, FLCN*, GREM1, HOXB13, KIT, LZTR1, MAX*, MBD4, MEN1*, MET, MITF, MLH1*, MSH2*, MSH3*, MSH6*, MUTYH*, NF1*, NF2*, NTHL1*, PALB2*, PDGFRA, PMS2*, POLD1*, POLE*, POT1*, PRKAR1A*, PTCH1*, PTEN*, RAD51C*, RAD51D*, RB1*, RET, SDHA*, SDHAF2*, SDHB*, SDHC*, SDHD*, SMAD4*, SMARCA4*, SMARCB1*, SMARCE1*, STK11*, SUFU*, TMEM127*, TP53*, TSC1*, TSC2*, VHL*. RNA analysis is performed for * genes.   06/09/2022 Surgery   S/p left breast lumpectomy with SLNB by  Dr. Tonna Boehringer  Pathology showed 1.4 cm IDC, grade 3, DCIS present grade 2-3 with comedonecrosis, 0/3 lymph nodes negative, margins negative, pT1c.  ER/PR negative.  HER2 positive   07/18/2022 -  Chemotherapy   Patient is on Treatment Plan : BREAST Paclitaxel + Trastuzumab q7d / Trastuzumab q21d      Menarche age 40 or 21 Age at first birth 59 Used birth control pills and Depo more than 5 years Menopause was age 24 Hysterectomy yes for prolapsed uterus HRT no History of breast biopsies yes for breast cyst in left lower Family history of lung cancer in mother who was smoker  MEDICAL HISTORY:  Past Medical History:  Diagnosis Date   Allergy    Anxiety    Arthritis    Breast cancer (HCC) 05/27/2022   IBS (irritable bowel syndrome)    Trigger finger    Left Middle Finger 11/2016    SURGICAL HISTORY: Past Surgical History:  Procedure Laterality Date   ABDOMINAL HYSTERECTOMY  02/2016   Only cervix remains (done for prolapse)   AUGMENTATION MAMMAPLASTY Bilateral 2010   BREAST BIOPSY Left 05/17/2022   [place] BX, ribbon  clip, path pending   BREAST BIOPSY Left 05/17/2022   [place] LT BREAST BX W LOC DEV 1ST LESION IMG BX SPEC US GUIDE 05/17/2022 [facility]   BREAST CYST ASPIRATION Left 2010   neg   BREAST ENHANCEMENT SURGERY  1998   CHOLECYSTECTOMY     COLONOSCOPY WITH PROPOFOL N/A 10/10/2019   Procedure: COLONOSCOPY WITH PROPOFOL;  Surgeon: Wyline Mood, MD;  Location: [facility] ENDOSCOPY;  Service: Gastroenterology;  Laterality: N/A;   PARTIAL MASTECTOMY WITH AXILLARY SENTINEL LYMPH NODE BIOPSY Left 06/09/2022   Procedure: PARTIAL MASTECTOMY WITH AXILLARY SENTINEL LYMPH NODE BIOPSY;  Surgeon: Sung Amabile, DO;  Location: [facility] ORS;  Service: General;  Laterality: Left;   PORTACATH PLACEMENT N/A 06/30/2022   Procedure: INSERTION PORT-A-CATH;  Surgeon: Sung Amabile, DO;  Location: [facility] ORS;  Service: General;  Laterality: N/A;   TYMPANOPLASTY Left     SOCIAL HISTORY: Social History   Socioeconomic History   Marital status: Married    Spouse name: Onalee Hua   Number of children: 1   Years of education: Not on file   Highest education level: Not on file  Occupational History   Not on file  Tobacco Use   Smoking status: Former    Types: Cigarettes    Quit date: 09/09/1992    Years since quitting: 29.9   Smokeless tobacco: Current    Types: Chew   Tobacco comments:    Chewing tobacco   Vaping Use   Vaping Use: Former  Substance and Sexual Activity   Alcohol use: Not Currently   Drug use: Yes    Types: Marijuana    Comment: daily   Sexual activity: Yes    Birth control/protection: None  Other Topics Concern   Not on file  Social History Narrative   Not on file   Social Determinants of Health   Financial Resource Strain: Not on file  Food Insecurity: No Food Insecurity (05/27/2022)   Hunger Vital Sign    Worried About Running Out of Food in the Last Year: Never true    Ran Out of Food in the Last Year: Never true  Transportation Needs: No Transportation Needs (05/27/2022)   PRAPARE - Therapist, art (Medical): No    Lack of Transportation (Non-Medical): No  Physical Activity: Not on file  Stress: Not on file  Social Connections: Not on file  Intimate Partner Violence: Not At Risk (05/27/2022)   Humiliation, Afraid, Rape, and Kick questionnaire    Fear of Current or Ex-Partner: No    Emotionally Abused: No    Physically Abused: No    Sexually Abused: No    FAMILY HISTORY: Family History  Problem Relation Age of Onset   Lung cancer Mother 45   Hypertension Mother    Lupus Father    Leukemia Cousin 6   Breast cancer Neg Hx     ALLERGIES:  is allergic to poison ivy extract.  MEDICATIONS:  Current Outpatient Medications  Medication Sig Dispense Refill   ADVANCED FIBER COMPLEX PO Take 3 tablets by mouth daily. Gummies     alendronate (FOSAMAX) 70 MG tablet Take 70 mg by mouth once a week.     buPROPion (WELLBUTRIN SR) 150 MG 12 hr tablet TAKE 1 TABLET TWICE A DAY 180 tablet 1   busPIRone (BUSPAR) 5 MG tablet Take 1 tablet (5 mg total) by mouth 2 (two) times daily as needed. 180 tablet 1   HYDROcodone-acetaminophen (NORCO) 5-325 MG tablet Take 1 tablet by mouth every 6 (six) hours as needed for up to 6 doses for moderate pain. 6 tablet 0   ibuprofen (ADVIL) 600 MG tablet Take 1 tablet (600 mg total) by mouth every 6 (six) hours as needed. 30 tablet 0   lidocaine-prilocaine (EMLA) cream Apply to affected area once 30 g 3   loperamide (IMODIUM) 2 MG capsule Take 1 capsule (2 mg total) by mouth every 4 (four) hours as needed for diarrhea or loose stools. Every 4-6 hours as needed 30 capsule 0   LORazepam (ATIVAN) 0.5 MG tablet Take 1 tablet (0.5 mg total) by mouth every 8 (eight) hours. 30 tablet 0   magic mouthwash (multi-ingredient) oral suspension Take 5 mLs by mouth 4 (four) times daily as needed. 300 mL 0   magic mouthwash w/lidocaine SOLN Take 5 mLs by mouth 4 (four) times daily. 480 mL 3   metroNIDAZOLE (METROCREAM) 0.75 % cream Apply topically as  needed. (Patient taking differently: Apply 1 Application topically as needed.)     ondansetron (ZOFRAN) 8 MG tablet Take 1 tablet (8 mg total) by mouth every 8 (eight) hours as needed for nausea or vomiting. 30 tablet 1   OVER THE COUNTER MEDICATION 1.5 tablets in the morning and at bedtime. Calcium Bone Strength Supplement1     OVER THE COUNTER MEDICATION 2 tablets daily. Vitex Berry     OVER THE COUNTER MEDICATION in the morning, at noon, and at bedtime. Milk Thisle Seeds Tea     prochlorperazine (COMPAZINE) 10 MG tablet Take 1 tablet (10 mg total) by mouth every 6 (six) hours as needed for nausea or vomiting. 30 tablet 1   triamcinolone cream (KENALOG) 0.1 % Apply 1 Application topically as needed.     dicyclomine (BENTYL) 10 MG capsule Take 1 capsule (10 mg total) by mouth 3 (three) times daily as needed for up to 10 days for spasms. 30 capsule 0   No current facility-administered medications for this visit.    REVIEW OF SYSTEMS:   Pertinent information mentioned in HPI All other systems were reviewed with the patient and are negative.  PHYSICAL EXAMINATION: ECOG PERFORMANCE STATUS: 0 - Asymptomatic  Vitals:   08/29/22 0923  BP: 131/72  Pulse: 79  Temp: 97.6 F (36.4 C)  SpO2: 99%       Filed Weights   08/29/22 0923  Weight: 115 lb 12.8 oz (52.5 kg)       GENERAL:alert, no distress and comfortable SKIN: skin color, texture, turgor are normal, no rashes or significant lesions EYES: normal, conjunctiva are pink and non-injected, sclera clear OROPHARYNX:no exudate, no erythema and lips, buccal mucosa, and tongue normal  NECK: supple, thyroid normal size, non-tender, without nodularity LYMPH:  no palpable lymphadenopathy in the cervical, axillary or inguinal LUNGS: clear to auscultation and percussion with normal breathing effort HEART: regular rate & rhythm and no murmurs and no lower extremity edema ABDOMEN:abdomen soft, non-tender and normal bowel  sounds Musculoskeletal:no cyanosis of digits and no clubbing  PSYCH: alert & oriented x 3 with fluent speech NEURO: no focal motor/sensory deficits  LABORATORY DATA:  I have reviewed the data as listed Lab Results  Component Value Date   WBC 5.8 08/29/2022   HGB 12.1 08/29/2022   HCT 36.7 08/29/2022   MCV 90.2 08/29/2022   PLT 293 08/29/2022   Recent Labs    08/15/22 0908 08/23/22 1310 08/29/22 0904  NA 136 137 138  K 3.7 3.5 3.9  CL 103 105 105  CO2 24 24 26   GLUCOSE 125* 124* 107*  BUN 18 15 20   CREATININE 0.80 0.61 0.88  CALCIUM 8.9 8.5* 9.2  GFRNONAA >60 >60 >60  PROT 6.8 6.7 6.7  ALBUMIN 4.0 3.8 3.9  AST 22 31 22   ALT 19 33 34  ALKPHOS 47 40 45  BILITOT 0.5 0.4 0.6    RADIOGRAPHIC STUDIES: I have personally reviewed the radiological images as listed and agreed with the findings in the report. No results found.

## 2022-09-02 MED FILL — Dexamethasone Sodium Phosphate Inj 100 MG/10ML: INTRAMUSCULAR | Qty: 1 | Status: AC

## 2022-09-05 ENCOUNTER — Inpatient Hospital Stay: Payer: 59

## 2022-09-05 ENCOUNTER — Encounter: Payer: Self-pay | Admitting: Internal Medicine

## 2022-09-05 ENCOUNTER — Inpatient Hospital Stay (HOSPITAL_BASED_OUTPATIENT_CLINIC_OR_DEPARTMENT_OTHER): Payer: 59 | Admitting: Internal Medicine

## 2022-09-05 VITALS — BP 114/72 | HR 78 | Temp 97.6°F | Wt 115.7 lb

## 2022-09-05 DIAGNOSIS — Z5112 Encounter for antineoplastic immunotherapy: Secondary | ICD-10-CM | POA: Diagnosis not present

## 2022-09-05 DIAGNOSIS — Z5111 Encounter for antineoplastic chemotherapy: Secondary | ICD-10-CM

## 2022-09-05 DIAGNOSIS — Z171 Estrogen receptor negative status [ER-]: Secondary | ICD-10-CM | POA: Diagnosis not present

## 2022-09-05 DIAGNOSIS — C50912 Malignant neoplasm of unspecified site of left female breast: Secondary | ICD-10-CM

## 2022-09-05 DIAGNOSIS — D0512 Intraductal carcinoma in situ of left breast: Secondary | ICD-10-CM | POA: Diagnosis not present

## 2022-09-05 LAB — CMP (CANCER CENTER ONLY)
ALT: 25 U/L (ref 0–44)
AST: 23 U/L (ref 15–41)
Albumin: 3.6 g/dL (ref 3.5–5.0)
Alkaline Phosphatase: 45 U/L (ref 38–126)
Anion gap: 7 (ref 5–15)
BUN: 17 mg/dL (ref 8–23)
CO2: 25 mmol/L (ref 22–32)
Calcium: 8.7 mg/dL — ABNORMAL LOW (ref 8.9–10.3)
Chloride: 106 mmol/L (ref 98–111)
Creatinine: 0.82 mg/dL (ref 0.44–1.00)
GFR, Estimated: >60 mL/min (ref >60)
Glucose, Bld: 113 mg/dL — ABNORMAL HIGH (ref 70–99)
Potassium: 3.7 mmol/L (ref 3.5–5.1)
Sodium: 138 mmol/L (ref 135–145)
Total Bilirubin: 0.5 mg/dL (ref 0.3–1.2)
Total Protein: 6.5 g/dL (ref 6.5–8.1)

## 2022-09-05 LAB — CBC WITH DIFFERENTIAL (CANCER CENTER ONLY)
Abs Immature Granulocytes: 0.04 10*3/uL (ref 0.00–0.07)
Basophils Absolute: 0.1 10*3/uL (ref 0.0–0.1)
Basophils Relative: 1 %
Eosinophils Absolute: 0.2 10*3/uL (ref 0.0–0.5)
Eosinophils Relative: 4 %
HCT: 34.6 % — ABNORMAL LOW (ref 36.0–46.0)
Hemoglobin: 11.6 g/dL — ABNORMAL LOW (ref 12.0–15.0)
Immature Granulocytes: 1 %
Lymphocytes Relative: 24 %
Lymphs Abs: 1.4 10*3/uL (ref 0.7–4.0)
MCH: 30.2 pg (ref 26.0–34.0)
MCHC: 33.5 g/dL (ref 30.0–36.0)
MCV: 90.1 fL (ref 80.0–100.0)
Monocytes Absolute: 0.3 10*3/uL (ref 0.1–1.0)
Monocytes Relative: 5 %
Neutro Abs: 3.8 10*3/uL (ref 1.7–7.7)
Neutrophils Relative %: 65 %
Platelet Count: 324 10*3/uL (ref 150–400)
RBC: 3.84 MIL/uL — ABNORMAL LOW (ref 3.87–5.11)
RDW: 14 % (ref 11.5–15.5)
WBC Count: 5.8 10*3/uL (ref 4.0–10.5)
nRBC: 0 % (ref 0.0–0.2)

## 2022-09-05 MED ORDER — SODIUM CHLORIDE 0.9 % IV SOLN
Freq: Once | INTRAVENOUS | Status: AC
  Filled 2022-09-05: qty 250

## 2022-09-05 MED ORDER — SODIUM CHLORIDE 0.9 % IV SOLN
80.0000 mg/m2 | Freq: Once | INTRAVENOUS | Status: AC
  Administered 2022-09-05: 120 mg via INTRAVENOUS
  Filled 2022-09-05: qty 20

## 2022-09-05 MED ORDER — ACETAMINOPHEN 325 MG PO TABS
650.0000 mg | ORAL_TABLET | Freq: Once | ORAL | Status: AC
  Administered 2022-09-05: 650 mg via ORAL
  Filled 2022-09-05: qty 2

## 2022-09-05 MED ORDER — TRASTUZUMAB-ANNS CHEMO 150 MG IV SOLR
2.0000 mg/kg | Freq: Once | INTRAVENOUS | Status: AC
  Administered 2022-09-05: 105 mg via INTRAVENOUS
  Filled 2022-09-05: qty 5

## 2022-09-05 MED ORDER — SODIUM CHLORIDE 0.9 % IV SOLN
10.0000 mg | Freq: Once | INTRAVENOUS | Status: AC
  Administered 2022-09-05: 10 mg via INTRAVENOUS
  Filled 2022-09-05: qty 10

## 2022-09-05 MED ORDER — SODIUM CHLORIDE 0.9% FLUSH
10.0000 mL | INTRAVENOUS | Status: DC | PRN
  Administered 2022-09-05: 10 mL
  Filled 2022-09-05: qty 10

## 2022-09-05 MED ORDER — HEPARIN SOD (PORK) LOCK FLUSH 100 UNIT/ML IV SOLN
500.0000 [IU] | Freq: Once | INTRAVENOUS | Status: AC | PRN
  Administered 2022-09-05: 500 [IU]
  Filled 2022-09-05: qty 5

## 2022-09-05 MED ORDER — FAMOTIDINE IN NACL 20-0.9 MG/50ML-% IV SOLN
20.0000 mg | Freq: Once | INTRAVENOUS | Status: AC
  Administered 2022-09-05: 20 mg via INTRAVENOUS
  Filled 2022-09-05: qty 50

## 2022-09-05 MED ORDER — DIPHENHYDRAMINE HCL 50 MG/ML IJ SOLN
50.0000 mg | Freq: Once | INTRAMUSCULAR | Status: AC
  Administered 2022-09-05: 50 mg via INTRAVENOUS
  Filled 2022-09-05: qty 1

## 2022-09-05 NOTE — Patient Instructions (Signed)
[facility] [place] [facility]  Discharge Instructions: Thank you for choosing [facility] to provide your oncology and hematology care.  If you have a lab appointment with the Cancer Center, please go directly to the Cancer Center and check in at the registration area.  Wear comfortable clothing and clothing appropriate for easy access to any Portacath or PICC line.   We strive to give you quality time with your provider. You may need to reschedule your appointment if you arrive late (15 or more minutes).  Arriving late affects you and other patients whose appointments are after yours.  Also, if you miss three or more appointments without notifying the office, you may be dismissed from the clinic at the provider's discretion.      For prescription refill requests, have your pharmacy contact our office and allow 72 hours for refills to be completed.    Today you received the following chemotherapy and/or immunotherapy agents Kanjinti, Taxol      To help prevent nausea and vomiting after your treatment, we encourage you to take your nausea medication as directed.  BELOW ARE SYMPTOMS THAT SHOULD BE REPORTED IMMEDIATELY: *FEVER GREATER THAN 100.4 F (38 C) OR HIGHER *CHILLS OR SWEATING *NAUSEA AND VOMITING THAT IS NOT CONTROLLED WITH YOUR NAUSEA MEDICATION *UNUSUAL SHORTNESS OF BREATH *UNUSUAL BRUISING OR BLEEDING *URINARY PROBLEMS (pain or burning when urinating, or frequent urination) *BOWEL PROBLEMS (unusual diarrhea, constipation, pain near the anus) TENDERNESS IN MOUTH AND THROAT WITH OR WITHOUT PRESENCE OF ULCERS (sore throat, sores in mouth, or a toothache) UNUSUAL RASH, SWELLING OR PAIN  UNUSUAL VAGINAL DISCHARGE OR ITCHING   Items with * indicate a potential emergency and should be followed up as soon as possible or go to the Emergency Department if any problems should occur.  Please show the CHEMOTHERAPY ALERT CARD or IMMUNOTHERAPY ALERT CARD at  check-in to the Emergency Department and triage nurse.  Should you have questions after your visit or need to cancel or reschedule your appointment, please contact [facility] [place] [facility]  [phone] and follow the prompts.  Office hours are 8:00 a.m. to 4:30 p.m. Monday - Friday. Please note that voicemails left after 4:00 p.m. may not be returned until the following business day.  We are closed weekends and major holidays. You have access to a nurse at all times for urgent questions. Please call the main number to the clinic [phone] and follow the prompts.  For any non-urgent questions, you may also contact your provider using MyChart. We now offer e-Visits for anyone 18 and older to request care online for non-urgent symptoms. For details visit [email or web address].   Also download the MyChart app! Go to the app store, search "MyChart", open the app, select [facility], and log in with your MyChart username and password.    

## 2022-09-05 NOTE — Progress Notes (Signed)
Patient says that the tingling and numbness that was in her left pointer finger and thumb has pretty much went away. She stated that when she blew her nose the other day, she noticed that there was bloody scabs was in the tissue.

## 2022-09-05 NOTE — Progress Notes (Signed)
[facility] CONSULT NOTE  Patient Care Team: Lorre Munroe, NP as PCP - General (Internal Medicine) Hulen Luster, RN as Oncology Nurse Navigator   CANCER STAGING   Cancer Staging  Breast cancer [facility]) Staging form: Breast, AJCC 8th Edition - Clinical stage from 05/17/2022: Stage IA (cT1b, cN0, cM0, G3, ER-, PR-, HER2+) - Signed by Michaelyn Barter, MD on 05/27/2022 Stage prefix: Initial diagnosis Histologic grading system: 3 grade system  Current treatment Weekly paclitaxel with Herceptin (APT regimen) started on 07/18/2022  ASSESSMENT & PLAN:  Jennifer Garrison 62 y.o. female with pmh of osteoporosis and anxiety was referred to medical oncology for management of left breast stage IA invasive ductal cancer.  # Left breast IDC stage IA, ER/PR negative, HER2 positive -Patient felt a palpable mass in her left breast.   -S/p left breast lumpectomy with SLNB by Dr. Tonna Boehringer on 06/09/2022. Pathology showed 1.4 cm IDC, grade 3, DCIS present grade 2-3 with comedonecrosis, 0/3 lymph nodes negative, margins negative, pT1c.  ER/PR negative.  HER2 positive  -Echocardiogram done on 07/14/2022 showed EF of 50 to 55%.  No regional wall motion abnormalities.  -Patient seen today prior to cycle 2 day 22 of Taxol and Herceptin.  Labs reviewed and acceptable for treatment.  Will proceed.  # Abdominal spasm -improved. Dicyclomine 10 mg twice daily as needed helping with abdominal spasm.  #Constipation  - taking colace. If needed, can add miralax   # Anxiety -Worsened with the need of chemotherapy. -Continue with Ativan 0.5 mg every 8 hours as needed.  On Wellbutrin.  # Decreased appetite -Started on [employer] supplements.  Follows with nutrition.  # Chemo induced mucositis -Continue with Magic mouthwash as needed.  # Evaluated by genetics-testing negative  # Access-port.  Placed by Dr. Tonna Boehringer on 06/30/2022.  # Osteoporosis -On alendronate  Orders Placed This Encounter  Procedures    CBC with Differential (Cancer Center Only)    Standing Status:   Future    Standing Expiration Date:   09/19/2023   CMP (Cancer Center only)    Standing Status:   Future    Standing Expiration Date:   09/19/2023   CBC with Differential (Cancer Center Only)    Standing Status:   Future    Standing Expiration Date:   09/26/2023   CMP (Cancer Center only)    Standing Status:   Future    Standing Expiration Date:   09/26/2023   RTC in 1 week for MD visit, labs, cycle 3 day 1 of Taxol and Herceptin.  The total time spent in the appointment was 30 minutes encounter with patients including review of chart and various tests results, discussions about plan of care and coordination of care plan   All questions were answered. The patient knows to call the clinic with any problems, questions or concerns. No barriers to learning was detected.  Michaelyn Barter, MD 6/10/20249:21 AM   HISTORY OF PRESENTING ILLNESS:  Jennifer Garrison 62 y.o. female with pmh of osteoporosis on alendronate and anxiety was referred to medical oncology for further management of stage Ia left breast invasive ductal cancer HER2 positive  Patient felt a palpable mass in her left breast mid February 2024.  She sought medical attention from PCP.  Further imaging as below.  INTERVAL HISTORY- Patient was seen today accompanied by husband prior to cycle 2 day 22 of Taxol and Herceptin.  Patient is doing very well with the treatments.  She has fluctuating diarrhea and constipation.  Occasional abdominal spasms managed with Bentyl.  Reports 2-3 episodes of blowing her nose and noticing some bloody scabs on her tissue paper.  Denies any neuropathy.  I have reviewed her chart and materials related to her cancer extensively and collaborated history with the patient. Summary of oncologic history is as follows: Oncology History  Breast cancer (HCC)  05/11/2022 Mammogram   Diagnostic mammogram and ultrasound- FINDINGS: Full field and spot  compression views of the LEFT breast demonstrate a 1 cm irregular mass within the UPPER INNER LEFT breast, at the site of patient concern. The patient has retropectoral implants.   On physical exam, a firm palpable mass identified at the 11 o'clock position of the LEFT breast 8 cm from the nipple.   Targeted ultrasound is performed, showing a 0.9 x 0.8 x 1 cm irregular hypoechoic mass at the 11 o'clock position of the LEFT breast 8 cm from the nipple.   No abnormal LEFT axillary lymph nodes are noted.   IMPRESSION: 1. Suspicious 1 cm UPPER INNER LEFT breast mass. Tissue sampling is recommended. 2. No abnormal appearing LEFT axillary lymph nodes.   05/17/2022 Cancer Staging   Staging form: Breast, AJCC 8th Edition - Clinical stage from 05/17/2022: Stage IA (cT1b, cN0, cM0, G3, ER-, PR-, HER2+) - Signed by Michaelyn Barter, MD on 05/27/2022 Stage prefix: Initial diagnosis Histologic grading system: 3 grade system   05/17/2022 Initial Biopsy   DIAGNOSIS:  A. BREAST, LEFT, 11:00, 8 CM FROM NIPPLE, ULTRASOUND-GUIDED BIOPSY:   - INVASIVE MAMMARY CARCINOMA, NOS DUCTAL.   Size of invasive carcinoma: 10 mm in this sample  Histologic grade of invasive carcinoma: Grade 3                       Glandular/tubular differentiation score: 3                       Nuclear pleomorphism score: 3                       Mitotic rate score: 2                       Total score: 8  Ductal carcinoma in situ: Not identified  Lymphovascular invasion: Not identified   Estrogen Receptor (ER) Status: NEGATIVE (LESS THAN 1%)   Progesterone Receptor (PgR) Status: NEGATIVE (LESS THAN 1%)   HER2 (by immunohistochemistry): POSITIVE (Score 3+)       Percentage of cells with uniform intense complete membrane  staining: 80%  Ki-67: Not performed     Genetic Testing   No pathogenic variants identified on the [facility] Multi-Cancer+RNA panel. VUS in MUTYH called c.1484G>A identified. The report date is  06/09/2022.  The Multi-Cancer + RNA Panel offered by [facility] includes sequencing and/or deletion/duplication analysis of the following 70 genes:  AIP*, ALK, APC*, ATM*, AXIN2*, BAP1*, BARD1*, BLM*, BMPR1A*, BRCA1*, BRCA2*, BRIP1*, CDC73*, CDH1*, CDK4, CDKN1B*, CDKN2A, CHEK2*, CTNNA1*, DICER1*, EPCAM, EGFR, FH*, FLCN*, GREM1, HOXB13, KIT, LZTR1, MAX*, MBD4, MEN1*, MET, MITF, MLH1*, MSH2*, MSH3*, MSH6*, MUTYH*, NF1*, NF2*, NTHL1*, PALB2*, PDGFRA, PMS2*, POLD1*, POLE*, POT1*, PRKAR1A*, PTCH1*, PTEN*, RAD51C*, RAD51D*, RB1*, RET, SDHA*, SDHAF2*, SDHB*, SDHC*, SDHD*, SMAD4*, SMARCA4*, SMARCB1*, SMARCE1*, STK11*, SUFU*, TMEM127*, TP53*, TSC1*, TSC2*, VHL*. RNA analysis is performed for * genes.   06/09/2022 Surgery   S/p left breast lumpectomy with SLNB by Dr. Tonna Boehringer  Pathology showed 1.4 cm IDC, grade 3, DCIS present grade 2-3  with comedonecrosis, 0/3 lymph nodes negative, margins negative, pT1c.  ER/PR negative.  HER2 positive   07/18/2022 -  Chemotherapy   Patient is on Treatment Plan : BREAST Paclitaxel + Trastuzumab q7d / Trastuzumab q21d      Menarche age 60 or 41 Age at first birth 25 Used birth control pills and Depo more than 5 years Menopause was age 49 Hysterectomy yes for prolapsed uterus HRT no History of breast biopsies yes for breast cyst in left lower Family history of lung cancer in mother who was smoker  MEDICAL HISTORY:  Past Medical History:  Diagnosis Date   Allergy    Anxiety    Arthritis    Breast cancer (HCC) 05/27/2022   IBS (irritable bowel syndrome)    Trigger finger    Left Middle Finger 11/2016    SURGICAL HISTORY: Past Surgical History:  Procedure Laterality Date   ABDOMINAL HYSTERECTOMY  02/2016   Only cervix remains (done for prolapse)   AUGMENTATION MAMMAPLASTY Bilateral 2010   BREAST BIOPSY Left 05/17/2022   [place] BX, ribbon clip, path pending   BREAST BIOPSY Left 05/17/2022   [place] LT BREAST BX W LOC DEV 1ST LESION IMG BX SPEC US GUIDE 05/17/2022  [facility]   BREAST CYST ASPIRATION Left 2010   neg   BREAST ENHANCEMENT SURGERY  1998   CHOLECYSTECTOMY     COLONOSCOPY WITH PROPOFOL N/A 10/10/2019   Procedure: COLONOSCOPY WITH PROPOFOL;  Surgeon: Wyline Mood, MD;  Location: [facility] ENDOSCOPY;  Service: Gastroenterology;  Laterality: N/A;   PARTIAL MASTECTOMY WITH AXILLARY SENTINEL LYMPH NODE BIOPSY Left 06/09/2022   Procedure: PARTIAL MASTECTOMY WITH AXILLARY SENTINEL LYMPH NODE BIOPSY;  Surgeon: Sung Amabile, DO;  Location: [facility] ORS;  Service: General;  Laterality: Left;   PORTACATH PLACEMENT N/A 06/30/2022   Procedure: INSERTION PORT-A-CATH;  Surgeon: Sung Amabile, DO;  Location: [facility] ORS;  Service: General;  Laterality: N/A;   TYMPANOPLASTY Left     SOCIAL HISTORY: Social History   Socioeconomic History   Marital status: Married    Spouse name: Onalee Hua   Number of children: 1   Years of education: Not on file   Highest education level: Not on file  Occupational History   Not on file  Tobacco Use   Smoking status: Former    Types: Cigarettes    Quit date: 09/09/1992    Years since quitting: 30.0   Smokeless tobacco: Current    Types: Chew   Tobacco comments:    Chewing tobacco   Vaping Use   Vaping Use: Former  Substance and Sexual Activity   Alcohol use: Not Currently   Drug use: Yes    Types: Marijuana    Comment: daily   Sexual activity: Yes    Birth control/protection: None  Other Topics Concern   Not on file  Social History Narrative   Not on file   Social Determinants of Health   Financial Resource Strain: Not on file  Food Insecurity: No Food Insecurity (05/27/2022)   Hunger Vital Sign    Worried About Running Out of Food in the Last Year: Never true    Ran Out of Food in the Last Year: Never true  Transportation Needs: No Transportation Needs (05/27/2022)   PRAPARE - Administrator, Civil Service (Medical): No    Lack of Transportation (Non-Medical): No  Physical Activity: Not on file   Stress: Not on file  Social Connections: Not on file  Intimate Partner Violence: Not At Risk (05/27/2022)  Humiliation, Afraid, Rape, and Kick questionnaire    Fear of Current or Ex-Partner: No    Emotionally Abused: No    Physically Abused: No    Sexually Abused: No    FAMILY HISTORY: Family History  Problem Relation Age of Onset   Lung cancer Mother 69   Hypertension Mother    Lupus Father    Leukemia Cousin 6   Breast cancer Neg Hx     ALLERGIES:  is allergic to poison ivy extract.  MEDICATIONS:  Current Outpatient Medications  Medication Sig Dispense Refill   ADVANCED FIBER COMPLEX PO Take 3 tablets by mouth daily. Gummies     alendronate (FOSAMAX) 70 MG tablet Take 70 mg by mouth once a week.     buPROPion (WELLBUTRIN SR) 150 MG 12 hr tablet TAKE 1 TABLET TWICE A DAY 180 tablet 1   busPIRone (BUSPAR) 5 MG tablet Take 1 tablet (5 mg total) by mouth 2 (two) times daily as needed. 180 tablet 1   dicyclomine (BENTYL) 10 MG capsule Take 1 capsule (10 mg total) by mouth 3 (three) times daily as needed for up to 10 days for spasms. 30 capsule 0   HYDROcodone-acetaminophen (NORCO) 5-325 MG tablet Take 1 tablet by mouth every 6 (six) hours as needed for up to 6 doses for moderate pain. 6 tablet 0   ibuprofen (ADVIL) 600 MG tablet Take 1 tablet (600 mg total) by mouth every 6 (six) hours as needed. 30 tablet 0   lidocaine-prilocaine (EMLA) cream Apply to affected area once 30 g 3   loperamide (IMODIUM) 2 MG capsule Take 1 capsule (2 mg total) by mouth every 4 (four) hours as needed for diarrhea or loose stools. Every 4-6 hours as needed 30 capsule 0   LORazepam (ATIVAN) 0.5 MG tablet Take 1 tablet (0.5 mg total) by mouth every 8 (eight) hours. 30 tablet 0   magic mouthwash (multi-ingredient) oral suspension Take 5 mLs by mouth 4 (four) times daily as needed. 300 mL 0   magic mouthwash w/lidocaine SOLN Take 5 mLs by mouth 4 (four) times daily. 480 mL 3   metroNIDAZOLE (METROCREAM)  0.75 % cream Apply topically as needed. (Patient taking differently: Apply 1 Application topically as needed.)     ondansetron (ZOFRAN) 8 MG tablet Take 1 tablet (8 mg total) by mouth every 8 (eight) hours as needed for nausea or vomiting. 30 tablet 1   OVER THE COUNTER MEDICATION 1.5 tablets in the morning and at bedtime. Calcium Bone Strength Supplement1     OVER THE COUNTER MEDICATION 2 tablets daily. Vitex Berry     OVER THE COUNTER MEDICATION in the morning, at noon, and at bedtime. Milk Thisle Seeds Tea     prochlorperazine (COMPAZINE) 10 MG tablet Take 1 tablet (10 mg total) by mouth every 6 (six) hours as needed for nausea or vomiting. 30 tablet 1   triamcinolone cream (KENALOG) 0.1 % Apply 1 Application topically as needed.     No current facility-administered medications for this visit.    REVIEW OF SYSTEMS:   Pertinent information mentioned in HPI All other systems were reviewed with the patient and are negative.  PHYSICAL EXAMINATION: ECOG PERFORMANCE STATUS: 0 - Asymptomatic  Vitals:   09/05/22 0859  BP: 114/72  Pulse: 78  Temp: 97.6 F (36.4 C)  SpO2: 100%       Filed Weights   09/05/22 0859  Weight: 115 lb 11.2 oz (52.5 kg)       GENERAL:alert,  no distress and comfortable SKIN: skin color, texture, turgor are normal, no rashes or significant lesions EYES: normal, conjunctiva are pink and non-injected, sclera clear OROPHARYNX:no exudate, no erythema and lips, buccal mucosa, and tongue normal  NECK: supple, thyroid normal size, non-tender, without nodularity LYMPH:  no palpable lymphadenopathy in the cervical, axillary or inguinal LUNGS: clear to auscultation and percussion with normal breathing effort HEART: regular rate & rhythm and no murmurs and no lower extremity edema ABDOMEN:abdomen soft, non-tender and normal bowel sounds Musculoskeletal:no cyanosis of digits and no clubbing  PSYCH: alert & oriented x 3 with fluent speech NEURO: no focal  motor/sensory deficits  LABORATORY DATA:  I have reviewed the data as listed Lab Results  Component Value Date   WBC 5.8 09/05/2022   HGB 11.6 (L) 09/05/2022   HCT 34.6 (L) 09/05/2022   MCV 90.1 09/05/2022   PLT 324 09/05/2022   Recent Labs    08/23/22 1310 08/29/22 0904 09/05/22 0837  NA 137 138 138  K 3.5 3.9 3.7  CL 105 105 106  CO2 24 26 25   GLUCOSE 124* 107* 113*  BUN 15 20 17   CREATININE 0.61 0.88 0.82  CALCIUM 8.5* 9.2 8.7*  GFRNONAA >60 >60 >60  PROT 6.7 6.7 6.5  ALBUMIN 3.8 3.9 3.6  AST 31 22 23   ALT 33 34 25  ALKPHOS 40 45 45  BILITOT 0.4 0.6 0.5    RADIOGRAPHIC STUDIES: I have personally reviewed the radiological images as listed and agreed with the findings in the report. No results found.

## 2022-09-06 ENCOUNTER — Other Ambulatory Visit: Payer: Self-pay

## 2022-09-09 MED FILL — Dexamethasone Sodium Phosphate Inj 100 MG/10ML: INTRAMUSCULAR | Qty: 1 | Status: AC

## 2022-09-12 ENCOUNTER — Inpatient Hospital Stay: Payer: 59

## 2022-09-12 ENCOUNTER — Inpatient Hospital Stay (HOSPITAL_BASED_OUTPATIENT_CLINIC_OR_DEPARTMENT_OTHER): Payer: 59 | Admitting: Internal Medicine

## 2022-09-12 ENCOUNTER — Encounter: Payer: Self-pay | Admitting: Internal Medicine

## 2022-09-12 VITALS — BP 131/65 | HR 89 | Temp 97.6°F | Wt 116.8 lb

## 2022-09-12 DIAGNOSIS — C50912 Malignant neoplasm of unspecified site of left female breast: Secondary | ICD-10-CM

## 2022-09-12 DIAGNOSIS — D0512 Intraductal carcinoma in situ of left breast: Secondary | ICD-10-CM | POA: Diagnosis not present

## 2022-09-12 DIAGNOSIS — Z171 Estrogen receptor negative status [ER-]: Secondary | ICD-10-CM | POA: Diagnosis not present

## 2022-09-12 DIAGNOSIS — Z5111 Encounter for antineoplastic chemotherapy: Secondary | ICD-10-CM | POA: Diagnosis not present

## 2022-09-12 DIAGNOSIS — Z5112 Encounter for antineoplastic immunotherapy: Secondary | ICD-10-CM | POA: Diagnosis not present

## 2022-09-12 LAB — CMP (CANCER CENTER ONLY)
ALT: 18 U/L (ref 0–44)
AST: 21 U/L (ref 15–41)
Albumin: 3.7 g/dL (ref 3.5–5.0)
Alkaline Phosphatase: 46 U/L (ref 38–126)
Anion gap: 6 (ref 5–15)
BUN: 19 mg/dL (ref 8–23)
CO2: 25 mmol/L (ref 22–32)
Calcium: 8.9 mg/dL (ref 8.9–10.3)
Chloride: 106 mmol/L (ref 98–111)
Creatinine: 0.87 mg/dL (ref 0.44–1.00)
GFR, Estimated: >60 mL/min (ref >60)
Glucose, Bld: 127 mg/dL — ABNORMAL HIGH (ref 70–99)
Potassium: 3.9 mmol/L (ref 3.5–5.1)
Sodium: 137 mmol/L (ref 135–145)
Total Bilirubin: 0.6 mg/dL (ref 0.3–1.2)
Total Protein: 6.3 g/dL — ABNORMAL LOW (ref 6.5–8.1)

## 2022-09-12 LAB — CBC WITH DIFFERENTIAL (CANCER CENTER ONLY)
Abs Immature Granulocytes: 0.02 10*3/uL (ref 0.00–0.07)
Basophils Absolute: 0.1 10*3/uL (ref 0.0–0.1)
Basophils Relative: 2 %
Eosinophils Absolute: 0.2 10*3/uL (ref 0.0–0.5)
Eosinophils Relative: 4 %
HCT: 34.2 % — ABNORMAL LOW (ref 36.0–46.0)
Hemoglobin: 11.3 g/dL — ABNORMAL LOW (ref 12.0–15.0)
Immature Granulocytes: 1 %
Lymphocytes Relative: 37 %
Lymphs Abs: 1.4 10*3/uL (ref 0.7–4.0)
MCH: 30.1 pg (ref 26.0–34.0)
MCHC: 33 g/dL (ref 30.0–36.0)
MCV: 91 fL (ref 80.0–100.0)
Monocytes Absolute: 0.2 10*3/uL (ref 0.1–1.0)
Monocytes Relative: 6 %
Neutro Abs: 1.9 10*3/uL (ref 1.7–7.7)
Neutrophils Relative %: 50 %
Platelet Count: 298 10*3/uL (ref 150–400)
RBC: 3.76 MIL/uL — ABNORMAL LOW (ref 3.87–5.11)
RDW: 14.4 % (ref 11.5–15.5)
WBC Count: 3.9 10*3/uL — ABNORMAL LOW (ref 4.0–10.5)
nRBC: 0 % (ref 0.0–0.2)

## 2022-09-12 MED ORDER — TRASTUZUMAB-ANNS CHEMO 150 MG IV SOLR
2.0000 mg/kg | Freq: Once | INTRAVENOUS | Status: AC
  Administered 2022-09-12: 105 mg via INTRAVENOUS
  Filled 2022-09-12: qty 5

## 2022-09-12 MED ORDER — FAMOTIDINE IN NACL 20-0.9 MG/50ML-% IV SOLN
20.0000 mg | Freq: Once | INTRAVENOUS | Status: AC
  Administered 2022-09-12: 20 mg via INTRAVENOUS
  Filled 2022-09-12: qty 50

## 2022-09-12 MED ORDER — SODIUM CHLORIDE 0.9 % IV SOLN
80.0000 mg/m2 | Freq: Once | INTRAVENOUS | Status: AC
  Administered 2022-09-12: 120 mg via INTRAVENOUS
  Filled 2022-09-12: qty 20

## 2022-09-12 MED ORDER — SODIUM CHLORIDE 0.9 % IV SOLN
10.0000 mg | Freq: Once | INTRAVENOUS | Status: AC
  Administered 2022-09-12: 10 mg via INTRAVENOUS
  Filled 2022-09-12: qty 10

## 2022-09-12 MED ORDER — SODIUM CHLORIDE 0.9 % IV SOLN
Freq: Once | INTRAVENOUS | Status: AC
  Filled 2022-09-12: qty 250

## 2022-09-12 MED ORDER — ACETAMINOPHEN 325 MG PO TABS
650.0000 mg | ORAL_TABLET | Freq: Once | ORAL | Status: AC
  Administered 2022-09-12: 650 mg via ORAL
  Filled 2022-09-12: qty 2

## 2022-09-12 MED ORDER — HEPARIN SOD (PORK) LOCK FLUSH 100 UNIT/ML IV SOLN
500.0000 [IU] | Freq: Once | INTRAVENOUS | Status: AC | PRN
  Administered 2022-09-12: 500 [IU]
  Filled 2022-09-12: qty 5

## 2022-09-12 MED ORDER — DIPHENHYDRAMINE HCL 50 MG/ML IJ SOLN
50.0000 mg | Freq: Once | INTRAMUSCULAR | Status: AC
  Administered 2022-09-12: 50 mg via INTRAVENOUS
  Filled 2022-09-12: qty 1

## 2022-09-12 NOTE — Patient Instructions (Signed)
[facility]  Discharge Instructions: Thank you for choosing [facility] to provide your oncology and hematology care.  If you have a lab appointment with the [facility], please go directly to the [facility] and check in at the registration area.  Wear comfortable clothing and clothing appropriate for easy access to any Portacath or PICC line.   We strive to give you quality time with your provider. You may need to reschedule your appointment if you arrive late (15 or more minutes).  Arriving late affects you and other patients whose appointments are after yours.  Also, if you miss three or more appointments without notifying the office, you may be dismissed from the clinic at the provider's discretion.      For prescription refill requests, have your pharmacy contact our office and allow 72 hours for refills to be completed.    Today you received the following chemotherapy and/or immunotherapy agents KANJINTI      To help prevent nausea and vomiting after your treatment, we encourage you to take your nausea medication as directed.  BELOW ARE SYMPTOMS THAT SHOULD BE REPORTED IMMEDIATELY: *FEVER GREATER THAN 100.4 F (38 C) OR HIGHER *CHILLS OR SWEATING *NAUSEA AND VOMITING THAT IS NOT CONTROLLED WITH YOUR NAUSEA MEDICATION *UNUSUAL SHORTNESS OF BREATH *UNUSUAL BRUISING OR BLEEDING *URINARY PROBLEMS (pain or burning when urinating, or frequent urination) *BOWEL PROBLEMS (unusual diarrhea, constipation, pain near the anus) TENDERNESS IN MOUTH AND THROAT WITH OR WITHOUT PRESENCE OF ULCERS (sore throat, sores in mouth, or a toothache) UNUSUAL RASH, SWELLING OR PAIN  UNUSUAL VAGINAL DISCHARGE OR ITCHING   Items with * indicate a potential emergency and should be followed up as soon as possible or go to the Emergency Department if any problems should occur.  Please show the CHEMOTHERAPY ALERT CARD or IMMUNOTHERAPY ALERT CARD at check-in to  the Emergency Department and triage nurse.  Should you have questions after your visit or need to cancel or reschedule your appointment, please contact [facility]  [phone] and follow the prompts.  Office hours are 8:00 a.m. to 4:30 p.m. Monday - Friday. Please note that voicemails left after 4:00 p.m. may not be returned until the following business day.  We are closed weekends and major holidays. You have access to a nurse at all times for urgent questions. Please call the main number to the clinic [phone] and follow the prompts.  For any non-urgent questions, you may also contact your provider using MyChart. We now offer e-Visits for anyone 1 and older to request care online for non-urgent symptoms. For details visit [email or web address].   Also download the MyChart app! Go to the app store, search "MyChart", open the app, select [facility], and log in with your MyChart username and password.  Trastuzumab Injection What is this medication? TRASTUZUMAB (tras TOO zoo mab) treats breast cancer and stomach cancer. It works by blocking a protein that causes cancer cells to grow and multiply. This helps to slow or stop the spread of cancer cells. This medicine may be used for other purposes; ask your health care provider or pharmacist if you have questions. COMMON BRAND NAME(S): Herceptin, Janae Bridgeman, Ontruzant, Trazimera What should I tell my care team before I take this medication? They need to know if you have any of these conditions: Heart failure Lung disease An unusual or allergic reaction to trastuzumab, other medications, foods, dyes, or preservatives Pregnant or trying to  get pregnant Breast-feeding How should I use this medication? This medication is injected into a vein. It is given by your care team in a hospital or clinic setting. Talk to your care team about the use of this medication in children. It is not approved for  use in children. Overdosage: If you think you have taken too much of this medicine contact a poison control center or emergency room at once. NOTE: This medicine is only for you. Do not share this medicine with others. What if I miss a dose? Keep appointments for follow-up doses. It is important not to miss your dose. Call your care team if you are unable to keep an appointment. What may interact with this medication? Certain types of chemotherapy, such as daunorubicin, doxorubicin, epirubicin, idarubicin This list may not describe all possible interactions. Give your health care provider a list of all the medicines, herbs, non-prescription drugs, or dietary supplements you use. Also tell them if you smoke, drink alcohol, or use illegal drugs. Some items may interact with your medicine. What should I watch for while using this medication? Your condition will be monitored carefully while you are receiving this medication. This medication may make you feel generally unwell. This is not uncommon, as chemotherapy affects healthy cells as well as cancer cells. Report any side effects. Continue your course of treatment even though you feel ill unless your care team tells you to stop. This medication may increase your risk of getting an infection. Call your care team for advice if you get a fever, chills, sore throat, or other symptoms of a cold or flu. Do not treat yourself. Try to avoid being around people who are sick. Avoid taking medications that contain aspirin, acetaminophen, ibuprofen, naproxen, or ketoprofen unless instructed by your care team. These medications can hide a fever. Talk to your care team if you may be pregnant. Serious birth defects can occur if you take this medication during pregnancy and for 7 months after the last dose. You will need a negative pregnancy test before starting this medication. Contraception is recommended while taking this medication and for 7 months after the last  dose. Your care team can help you find the option that works for you. Do not breastfeed while taking this medication and for 7 months after stopping treatment. What side effects may I notice from receiving this medication? Side effects that you should report to your care team as soon as possible: Allergic reactions or angioedema--skin rash, itching or hives, swelling of the face, eyes, lips, tongue, arms, or legs, trouble swallowing or breathing Dry cough, shortness of breath or trouble breathing Heart failure--shortness of breath, swelling of the ankles, feet, or hands, sudden weight gain, unusual weakness or fatigue Infection--fever, chills, cough, or sore throat Infusion reactions--chest pain, shortness of breath or trouble breathing, feeling faint or lightheaded Side effects that usually do not require medical attention (report to your care team if they continue or are bothersome): Diarrhea Dizziness Headache Nausea Trouble sleeping Vomiting This list may not describe all possible side effects. Call your doctor for medical advice about side effects. You may report side effects to FDA at 1-800-FDA-1088. Where should I keep my medication? This medication is given in a hospital or clinic. It will not be stored at home. NOTE: This sheet is a summary. It may not cover all possible information. If you have questions about this medicine, talk to your doctor, pharmacist, or health care provider.  2024 Elsevier/Gold Standard (2021-07-27 00:00:00)

## 2022-09-12 NOTE — Progress Notes (Signed)
Patient says her neuropathy is gone, her appetite [place] much better. Her concern today is about her radiation treatments and her Trastuzumab dose. They are planning a trip for July 19th- July 28th, and her last treatment is supposed to be on July 18th. She wants to know if it is ok to go ahead and book their flight for their trip for July19 - July 28th.

## 2022-09-12 NOTE — Progress Notes (Signed)
[facility] CONSULT NOTE  Patient Care Team: Lorre Munroe, NP as PCP - General (Internal Medicine) Hulen Luster, RN as Oncology Nurse Navigator   CANCER STAGING   Cancer Staging  Breast cancer [facility]) Staging form: Breast, AJCC 8th Edition - Clinical stage from 05/17/2022: Stage IA (cT1b, cN0, cM0, G3, ER-, PR-, HER2+) - Signed by Michaelyn Barter, MD on 05/27/2022 Stage prefix: Initial diagnosis Histologic grading system: 3 grade system  Current treatment Weekly paclitaxel with Herceptin (APT regimen) started on 07/18/2022  ASSESSMENT & PLAN:  Jennifer G[facility] 62 y.o. female with pmh of osteoporosis and anxiety was referred to medical oncology for management of left breast stage IA invasive ductal cancer.  # Left breast IDC stage IA, ER/PR negative, HER2 positive # Encounter for cardiac monitoring -Patient felt a palpable mass in her left breast.   -S/p left breast lumpectomy with SLNB by Dr. Tonna Boehringer on 06/09/2022. Pathology showed 1.4 cm IDC, grade 3, DCIS present grade 2-3 with comedonecrosis, 0/3 lymph nodes negative, margins negative, pT1c.  ER/PR negative.  HER2 positive  -Echocardiogram done on 07/14/2022 showed EF of 50 to 55%.  No regional wall motion abnormalities.  Will schedule echocardiogram after July 28 when she comes back from her trip for cardiac monitoring on Herceptin.  -Patient seen today prior to cycle 3 day 1 of Taxol and Herceptin.  Labs reviewed and acceptable for treatment.  Will proceed.  # Abdominal spasm -improved. Dicyclomine 10 mg twice daily as needed helping with abdominal spasm.  #Constipation  - taking colace. If needed, can add miralax   # Anxiety -Worsened with the need of chemotherapy. -Continue with Ativan 0.5 mg every 8 hours as needed.  On Wellbutrin.  # Decreased appetite -Started on [employer] supplements.  Follows with nutrition.  # Chemo induced mucositis -Continue with Magic mouthwash as needed.  # Evaluated by  genetics-testing negative  # Access-port.  Placed by Dr. Tonna Boehringer on 06/30/2022.  # Osteoporosis -On alendronate  Orders Placed This Encounter  Procedures   Ambulatory referral to Radiation Oncology    Referral Priority:   Routine    Referral Type:   Consultation    Referral Reason:   Specialty Services Required    Requested Specialty:   Radiation Oncology    Number of Visits Requested:   1   ECHOCARDIOGRAM COMPLETE    Standing Status:   Future    Standing Expiration Date:   09/12/2023    Order Specific Question:   Where should this test be performed    Answer:   [facility]    Order Specific Question:   Please indicate who you request to read the nuc med / echo results.    Answer:   [facility] Unassigned    Order Specific Question:   Perflutren DEFINITY (image enhancing agent) should be administered unless hypersensitivity or allergy exist    Answer:   Administer Perflutren    Order Specific Question:   Reason for exam-Echo    Answer:   Chemo  Z09   RTC in 1 week for MD visit, labs, cycle 3 day 8 of Taxol and Herceptin.  The total time spent in the appointment was 30 minutes encounter with patients including review of chart and various tests results, discussions about plan of care and coordination of care plan   All questions were answered. The patient knows to call the clinic with any problems, questions or concerns. No barriers to learning was detected.  Michaelyn Barter, MD  6/17/202410:28 AM   HISTORY OF PRESENTING ILLNESS:  Jennifer Garrison 62 y.o. female with pmh of osteoporosis on alendronate and anxiety was referred to medical oncology for further management of stage Ia left breast invasive ductal cancer HER2 positive  Patient felt a palpable mass in her left breast mid February 2024.  She sought medical attention from PCP.  Further imaging as below.  INTERVAL HISTORY- Patient was seen today accompanied by husband prior to cycle 3 day 1 of Taxol and Herceptin.  Patient is  tolerating treatment and is very well.  Denies any neuropathy.  Appetite is good.  Denies any vomiting.  At times feels little upset stomach which responds to Zofran.  She is planning a trip in July and wants to make sure has radiation scheduled accordingly.  Has constipation.  Colace helps.  I have reviewed her chart and materials related to her cancer extensively and collaborated history with the patient. Summary of oncologic history is as follows: Oncology History  Breast cancer (HCC)  05/11/2022 Mammogram   Diagnostic mammogram and ultrasound- FINDINGS: Full field and spot compression views of the LEFT breast demonstrate a 1 cm irregular mass within the UPPER INNER LEFT breast, at the site of patient concern. The patient has retropectoral implants.   On physical exam, a firm palpable mass identified at the 11 o'clock position of the LEFT breast 8 cm from the nipple.   Targeted ultrasound is performed, showing a 0.9 x 0.8 x 1 cm irregular hypoechoic mass at the 11 o'clock position of the LEFT breast 8 cm from the nipple.   No abnormal LEFT axillary lymph nodes are noted.   IMPRESSION: 1. Suspicious 1 cm UPPER INNER LEFT breast mass. Tissue sampling is recommended. 2. No abnormal appearing LEFT axillary lymph nodes.   05/17/2022 Cancer Staging   Staging form: Breast, AJCC 8th Edition - Clinical stage from 05/17/2022: Stage IA (cT1b, cN0, cM0, G3, ER-, PR-, HER2+) - Signed by Michaelyn Barter, MD on 05/27/2022 Stage prefix: Initial diagnosis Histologic grading system: 3 grade system   05/17/2022 Initial Biopsy   DIAGNOSIS:  A. BREAST, LEFT, 11:00, 8 CM FROM NIPPLE, ULTRASOUND-GUIDED BIOPSY:   - INVASIVE MAMMARY CARCINOMA, NOS DUCTAL.   Size of invasive carcinoma: 10 mm in this sample  Histologic grade of invasive carcinoma: Grade 3                       Glandular/tubular differentiation score: 3                       Nuclear pleomorphism score: 3                       Mitotic rate  score: 2                       Total score: 8  Ductal carcinoma in situ: Not identified  Lymphovascular invasion: Not identified   Estrogen Receptor (ER) Status: NEGATIVE (LESS THAN 1%)   Progesterone Receptor (PgR) Status: NEGATIVE (LESS THAN 1%)   HER2 (by immunohistochemistry): POSITIVE (Score 3+)       Percentage of cells with uniform intense complete membrane  staining: 80%  Ki-67: Not performed     Genetic Testing   No pathogenic variants identified on the [facility] Multi-Cancer+RNA panel. VUS in MUTYH called c.1484G>A identified. The report date is 06/09/2022.  The Multi-Cancer + RNA Panel offered by [facility] includes sequencing  and/or deletion/duplication analysis of the following 70 genes:  AIP*, ALK, APC*, ATM*, AXIN2*, BAP1*, BARD1*, BLM*, BMPR1A*, BRCA1*, BRCA2*, BRIP1*, CDC73*, CDH1*, CDK4, CDKN1B*, CDKN2A, CHEK2*, CTNNA1*, DICER1*, EPCAM, EGFR, FH*, FLCN*, GREM1, HOXB13, KIT, LZTR1, MAX*, MBD4, MEN1*, MET, MITF, MLH1*, MSH2*, MSH3*, MSH6*, MUTYH*, NF1*, NF2*, NTHL1*, PALB2*, PDGFRA, PMS2*, POLD1*, POLE*, POT1*, PRKAR1A*, PTCH1*, PTEN*, RAD51C*, RAD51D*, RB1*, RET, SDHA*, SDHAF2*, SDHB*, SDHC*, SDHD*, SMAD4*, SMARCA4*, SMARCB1*, SMARCE1*, STK11*, SUFU*, TMEM127*, TP53*, TSC1*, TSC2*, VHL*. RNA analysis is performed for * genes.   06/09/2022 Surgery   S/p left breast lumpectomy with SLNB by Dr. Tonna Boehringer  Pathology showed 1.4 cm IDC, grade 3, DCIS present grade 2-3 with comedonecrosis, 0/3 lymph nodes negative, margins negative, pT1c.  ER/PR negative.  HER2 positive   07/18/2022 -  Chemotherapy   Patient is on Treatment Plan : BREAST Paclitaxel + Trastuzumab q7d / Trastuzumab q21d      Menarche age 57 or 20 Age at first birth 18 Used birth control pills and Depo more than 5 years Menopause was age 23 Hysterectomy yes for prolapsed uterus HRT no History of breast biopsies yes for breast cyst in left lower Family history of lung cancer in mother who was smoker  MEDICAL HISTORY:   Past Medical History:  Diagnosis Date   Allergy    Anxiety    Arthritis    Breast cancer (HCC) 05/27/2022   IBS (irritable bowel syndrome)    Trigger finger    Left Middle Finger 11/2016    SURGICAL HISTORY: Past Surgical History:  Procedure Laterality Date   ABDOMINAL HYSTERECTOMY  02/2016   Only cervix remains (done for prolapse)   AUGMENTATION MAMMAPLASTY Bilateral 2010   BREAST BIOPSY Left 05/17/2022   [place] BX, ribbon clip, path pending   BREAST BIOPSY Left 05/17/2022   [place] LT BREAST BX W LOC DEV 1ST LESION IMG BX SPEC US GUIDE 05/17/2022 [facility]   BREAST CYST ASPIRATION Left 2010   neg   BREAST ENHANCEMENT SURGERY  1998   CHOLECYSTECTOMY     COLONOSCOPY WITH PROPOFOL N/A 10/10/2019   Procedure: COLONOSCOPY WITH PROPOFOL;  Surgeon: Wyline Mood, MD;  Location: [facility];  Service: Gastroenterology;  Laterality: N/A;   PARTIAL MASTECTOMY WITH AXILLARY SENTINEL LYMPH NODE BIOPSY Left 06/09/2022   Procedure: PARTIAL MASTECTOMY WITH AXILLARY SENTINEL LYMPH NODE BIOPSY;  Surgeon: Sung Amabile, DO;  Location: [facility] OR[facility];  Service: General;  Laterality: Left;   PORTACATH PLACEMENT N/A 06/30/2022   Procedure: INSERTION PORT-A-CATH;  Surgeon: Sung Amabile, DO;  Location: [facility] ORS;  Service: General;  Laterality: N/A;   TYMPANOPLASTY Left     SOCIAL HISTORY: Social History   Socioeconomic History   Marital status: Married    Spouse name: Onalee Hua   Number of children: 1   Years of education: Not on file   Highest education level: Not on file  Occupational History   Not on file  Tobacco Use   Smoking status: Former    Types: Cigarettes    Quit date: 09/09/1992    Years since quitting: 30.0   Smokeless tobacco: Current    Types: Chew   Tobacco comments:    Chewing tobacco   Vaping Use   Vaping Use: Former  Substance and Sexual Activity   Alcohol use: Not Currently   Drug use: Yes    Types: Marijuana    Comment: daily   Sexual activity: Yes    Birth  control/protection: None  Other Topics Concern   Not on file  Social History Narrative  Not on file   Social Determinants of Health   Financial Resource Strain: Not on file  Food Insecurity: No Food Insecurity (05/27/2022)   Hunger Vital Sign    Worried About Running Out of Food in the Last Year: Never true    Ran Out of Food in the Last Year: Never true  Transportation Needs: No Transportation Needs (05/27/2022)   PRAPARE - Administrator, Civil Service (Medical): No    Lack of Transportation (Non-Medical): No  Physical Activity: Not on file  Stress: Not on file  Social Connections: Not on file  Intimate Partner Violence: Not At Risk (05/27/2022)   Humiliation, Afraid, Rape, and Kick questionnaire    Fear of Current or Ex-Partner: No    Emotionally Abused: No    Physically Abused: No    Sexually Abused: No    FAMILY HISTORY: Family History  Problem Relation Age of Onset   Lung cancer Mother 34   Hypertension Mother    Lupus Father    Leukemia Cousin 6   Breast cancer Neg Hx     ALLERGIES:  is allergic to poison ivy extract.  MEDICATIONS:  Current Outpatient Medications  Medication Sig Dispense Refill   ADVANCED FIBER COMPLEX PO Take 3 tablets by mouth daily. Gummies     alendronate (FOSAMAX) 70 MG tablet Take 70 mg by mouth once a week.     buPROPion (WELLBUTRIN SR) 150 MG 12 hr tablet TAKE 1 TABLET TWICE A DAY 180 tablet 1   busPIRone (BUSPAR) 5 MG tablet Take 1 tablet (5 mg total) by mouth 2 (two) times daily as needed. 180 tablet 1   HYDROcodone-acetaminophen (NORCO) 5-325 MG tablet Take 1 tablet by mouth every 6 (six) hours as needed for up to 6 doses for moderate pain. 6 tablet 0   ibuprofen (ADVIL) 600 MG tablet Take 1 tablet (600 mg total) by mouth every 6 (six) hours as needed. 30 tablet 0   lidocaine-prilocaine (EMLA) cream Apply to affected area once 30 g 3   LORazepam (ATIVAN) 0.5 MG tablet Take 1 tablet (0.5 mg total) by mouth every 8 (eight)  hours. 30 tablet 0   magic mouthwash (multi-ingredient) oral suspension Take 5 mLs by mouth 4 (four) times daily as needed. 300 mL 0   magic mouthwash w/lidocaine SOLN Take 5 mLs by mouth 4 (four) times daily. 480 mL 3   metroNIDAZOLE (METROCREAM) 0.75 % cream Apply topically as needed. (Patient taking differently: Apply 1 Application topically as needed.)     ondansetron (ZOFRAN) 8 MG tablet Take 1 tablet (8 mg total) by mouth every 8 (eight) hours as needed for nausea or vomiting. 30 tablet 1   OVER THE COUNTER MEDICATION 1.5 tablets in the morning and at bedtime. Calcium Bone Strength Supplement1     OVER THE COUNTER MEDICATION 2 tablets daily. [employer] Berry     OVER THE COUNTER MEDICATION in the morning, at noon, and at bedtime. Milk Thisle Seeds Tea     prochlorperazine (COMPAZINE) 10 MG tablet Take 1 tablet (10 mg total) by mouth every 6 (six) hours as needed for nausea or vomiting. 30 tablet 1   triamcinolone cream (KENALOG) 0.1 % Apply 1 Application topically as needed.     dicyclomine (BENTYL) 10 MG capsule Take 1 capsule (10 mg total) by mouth 3 (three) times daily as needed for up to 10 days for spasms. 30 capsule 0   No current facility-administered medications for this visit.   Facility-Administered  Medications Ordered in Other Visits  Medication Dose Route Frequency Provider Last Rate Last Admin   acetaminophen (TYLENOL) tablet 650 mg  650 mg Oral Once Michaelyn Barter, MD       dexamethasone (DECADRON) 10 mg in sodium chloride 0.9 % 50 mL IVPB  10 mg Intravenous Once Michaelyn Barter, MD       diphenhydrAMINE (BENADRYL) injection 50 mg  50 mg Intravenous Once Michaelyn Barter, MD       famotidine (PEPCID) IVPB 20 mg premix  20 mg Intravenous Once Michaelyn Barter, MD       heparin lock flush 100 unit/mL  500 Units Intracatheter Once PRN Michaelyn Barter, MD       PACLitaxel (TAXOL) 120 mg in sodium chloride 0.9 % 250 mL chemo infusion (</= 80mg /m2)  80 mg/m2 (Treatment Plan Recorded)  Intravenous Once Michaelyn Barter, MD       trastuzumab-anns [facility]) 105 mg in sodium chloride 0.9 % 250 mL chemo infusion  2 mg/kg (Treatment Plan Recorded) Intravenous Once Michaelyn Barter, MD        REVIEW OF SYSTEMS:   Pertinent information mentioned in HPI All other systems were reviewed with the patient and are negative.  PHYSICAL EXAMINATION: ECOG PERFORMANCE STATUS: 0 - Asymptomatic  Vitals:   09/12/22 0934  BP: 131/65  Pulse: 89  Temp: 97.6 F (36.4 C)  SpO2: 100%       Filed Weights   09/12/22 0934  Weight: 116 lb 12.8 oz (53 kg)       GENERAL:alert, no distress and comfortable SKIN: skin color, texture, turgor are normal, no rashes or significant lesions EYES: normal, conjunctiva are pink and non-injected, sclera clear OROPHARYNX:no exudate, no erythema and lips, buccal mucosa, and tongue normal  NECK: supple, thyroid normal size, non-tender, without nodularity LYMPH:  no palpable lymphadenopathy in the cervical, axillary or inguinal LUNGS: clear to auscultation and percussion with normal breathing effort HEART: regular rate & rhythm and no murmurs and no lower extremity edema ABDOMEN:abdomen soft, non-tender and normal bowel sounds Musculoskeletal:no cyanosis of digits and no clubbing  PSYCH: alert & oriented x 3 with fluent speech NEURO: no focal motor/sensory deficits  LABORATORY DATA:  I have reviewed the data as listed Lab Results  Component Value Date   WBC 3.9 (L) 09/12/2022   HGB 11.3 (L) 09/12/2022   HCT 34.2 (L) 09/12/2022   MCV 91.0 09/12/2022   PLT 298 09/12/2022   Recent Labs    08/29/22 0904 09/05/22 0837 09/12/22 0908  NA 138 138 137  K 3.9 3.7 3.9  CL 105 106 106  CO2 26 25 25   GLUCOSE 107* 113* 127*  BUN 20 17 19   CREATININE 0.88 0.82 0.87  CALCIUM 9.2 8.7* 8.9  GFRNONAA >60 >60 >60  PROT 6.7 6.5 6.3*  ALBUMIN 3.9 3.6 3.7  AST 22 23 21   ALT 34 25 18  ALKPHOS 45 45 46  BILITOT 0.6 0.5 0.6    RADIOGRAPHIC  STUDIES: I have personally reviewed the radiological images as listed and agreed with the findings in the report. No results found.

## 2022-09-13 ENCOUNTER — Inpatient Hospital Stay: Payer: 59

## 2022-09-13 ENCOUNTER — Other Ambulatory Visit: Payer: Self-pay

## 2022-09-13 NOTE — Progress Notes (Signed)
Nutrition Follow-up:  Patient with stage I a left breast cancer (ER/PR negative, HER2+).  Patient receiving taxol and kanjinti.  Spoke with patient via phone.  Reports that her appetite is good.  Eating everything.  Has noticed spicy foods she can't handle as before.  No mouth sores.  Reports constipation and diarrhea.  Taking colace.  Continues to drink [employer].      Medications: reviewed  Labs: reviewed  Anthropometrics:   Weight 116 lb 12.8 oz on 6/17 113 lb on 5/20 113 lb on 4/23 118 lb on 4/3    NUTRITION DIAGNOSIS: Altered GI function continues but manageable   INTERVENTION:  Patient adjusting bowel regimen as needed Continue [employer] shakes for added nutrition Continue lean protein foods and plant based diet to maintain weight during treatment Patient has RD contact number and will reach out if needed    NEXT VISIT: no follow-up Patient will contact RD if needed in the future  Gryphon Vanderveen B. Freida Busman, RD, LDN Registered Dietitian [phone]

## 2022-09-14 ENCOUNTER — Ambulatory Visit
Admission: RE | Admit: 2022-09-14 | Discharge: 2022-09-14 | Disposition: A | Discharge status: Home / Self Care | Payer: 59 | Source: Ambulatory Visit | Attending: Radiation Oncology | Admitting: Radiation Oncology

## 2022-09-14 ENCOUNTER — Encounter: Payer: Self-pay | Admitting: Radiation Oncology

## 2022-09-14 ENCOUNTER — Encounter: Payer: Self-pay | Admitting: Internal Medicine

## 2022-09-14 VITALS — BP 115/65 | HR 75 | Temp 98.0°F | Resp 14 | Ht 64.0 in | Wt 114.1 lb

## 2022-09-14 DIAGNOSIS — Z79899 Other long term (current) drug therapy: Secondary | ICD-10-CM | POA: Diagnosis not present

## 2022-09-14 DIAGNOSIS — C50912 Malignant neoplasm of unspecified site of left female breast: Secondary | ICD-10-CM | POA: Insufficient documentation

## 2022-09-14 DIAGNOSIS — Z801 Family history of malignant neoplasm of trachea, bronchus and lung: Secondary | ICD-10-CM | POA: Diagnosis not present

## 2022-09-14 DIAGNOSIS — F419 Anxiety disorder, unspecified: Secondary | ICD-10-CM | POA: Diagnosis not present

## 2022-09-14 DIAGNOSIS — Z171 Estrogen receptor negative status [ER-]: Secondary | ICD-10-CM | POA: Insufficient documentation

## 2022-09-14 DIAGNOSIS — Z87891 Personal history of nicotine dependence: Secondary | ICD-10-CM | POA: Insufficient documentation

## 2022-09-14 DIAGNOSIS — M129 Arthropathy, unspecified: Secondary | ICD-10-CM | POA: Insufficient documentation

## 2022-09-14 DIAGNOSIS — Z923 Personal history of irradiation: Secondary | ICD-10-CM | POA: Diagnosis not present

## 2022-09-14 DIAGNOSIS — K589 Irritable bowel syndrome without diarrhea: Secondary | ICD-10-CM | POA: Diagnosis not present

## 2022-09-15 NOTE — Consult Note (Signed)
NEW PATIENT EVALUATION  Name: Jennifer Garrison  MRN: [id number]  Date:   09/14/2022     DOB: 1961/03/10   This 62 y.o. female patient presents to the clinic for initial evaluation of stage Ia (pT1 cN0 M0) grade 3 ER/PR negative HER2/neu overexpressed invasive mammary carcinoma of the left breast undergoing adjuvant chemotherapy and Herceptin  REFERRING PHYSICIAN: Lorre Munroe, NP  CHIEF COMPLAINT:  Chief Complaint  Patient presents with   Breast Cancer    DIAGNOSIS: The encounter diagnosis was Malignant neoplasm of left breast in female, estrogen receptor negative, unspecified site of breast (HCC).   PREVIOUS INVESTIGATIONS:  Mammogram and ultrasound reviewed Clinical notes reviewed Pathology report reviewed  HPI: Patient is a 62 year old female who presented with self discovered mass in her left breast.  Diagnostic mammograms confirmed a 1 cm irregular mass in the upper inner left breast at the site of patient concern.  Patient has rectal pectoral implants.  Targeted ultrasound confirmed irregular hypoechoic mass at the 11 o'clock position left breast 8 cm from the nipple.  Biopsy was positive for invasive mammary carcinoma ER/PR negative HER2/neu overexpressed.  She went on to have a wide local excision showing overall grade three 1.4 cm invasive mammary carcinoma no special type there was focal LVI present.  Margins were clear at 0.4 mm for invasive component 2 mm for DCIS component.  3 sentinel lymph nodes were examined all negative for metastatic disease.  Patient subsequently is undergoing weekly Taxol with Herceptin which was started in April 24.  She will complete this in 3 weeks.  She is tolerated her adjuvant treatment well is seen today for radiation oncology opinion she specifically denies breast tenderness cough or bone pain.  PLANNED TREATMENT REGIMEN: Left deep inspiration breath-hold hypofractionated radiation therapy  PAST MEDICAL HISTORY:  has a past medical history of  Allergy, Anxiety, Arthritis, Breast cancer (HCC) (05/27/2022), IBS (irritable bowel syndrome), and Trigger finger.    PAST SURGICAL HISTORY:  Past Surgical History:  Procedure Laterality Date   ABDOMINAL HYSTERECTOMY  02/2016   Only cervix remains (done for prolapse)   AUGMENTATION MAMMAPLASTY Bilateral 2010   BREAST BIOPSY Left 05/17/2022   [place] BX, ribbon clip, path pending   BREAST BIOPSY Left 05/17/2022   [place] LT BREAST BX W LOC DEV 1ST LESION IMG BX SPEC US GUIDE 05/17/2022 [facility]   BREAST CYST ASPIRATION Left 2010   neg   BREAST ENHANCEMENT SURGERY  1998   CHOLECYSTECTOMY     COLONOSCOPY WITH PROPOFOL N/A 10/10/2019   Procedure: COLONOSCOPY WITH PROPOFOL;  Surgeon: Wyline Mood, MD;  Location: [facility] ENDOSCOPY;  Service: Gastroenterology;  Laterality: N/A;   PARTIAL MASTECTOMY WITH AXILLARY SENTINEL LYMPH NODE BIOPSY Left 06/09/2022   Procedure: PARTIAL MASTECTOMY WITH AXILLARY SENTINEL LYMPH NODE BIOPSY;  Surgeon: Sung Amabile, DO;  Location: [facility] ORS;  Service: General;  Laterality: Left;   PORTACATH PLACEMENT N/A 06/30/2022   Procedure: INSERTION PORT-A-CATH;  Surgeon: Sung Amabile, DO;  Location: [facility] ORS;  Service: General;  Laterality: N/A;   TYMPANOPLASTY Left     FAMILY HISTORY: family history includes Hypertension in her mother; Leukemia (age of onset: 71) in her cousin; Lung cancer (age of onset: 5) in her mother; Lupus in her father.  SOCIAL HISTORY:  reports that she quit smoking about 30 years ago. Her smoking use included cigarettes. Her smokeless tobacco use includes chew. She reports that she does not currently use alcohol. She reports current drug use. Drug: Marijuana.  ALLERGIES: Poison ivy extract  MEDICATIONS:  Current Outpatient Medications  Medication Sig Dispense Refill   ADVANCED FIBER COMPLEX PO Take 3 tablets by mouth daily. Gummies     alendronate (FOSAMAX) 70 MG tablet Take 70 mg by mouth once a week.     buPROPion (WELLBUTRIN SR) 150 MG 12 hr  tablet TAKE 1 TABLET TWICE A DAY 180 tablet 1   busPIRone (BUSPAR) 5 MG tablet Take 1 tablet (5 mg total) by mouth 2 (two) times daily as needed. 180 tablet 1   dicyclomine (BENTYL) 10 MG capsule Take 1 capsule (10 mg total) by mouth 3 (three) times daily as needed for up to 10 days for spasms. 30 capsule 0   HYDROcodone-acetaminophen (NORCO) 5-325 MG tablet Take 1 tablet by mouth every 6 (six) hours as needed for up to 6 doses for moderate pain. 6 tablet 0   ibuprofen (ADVIL) 600 MG tablet Take 1 tablet (600 mg total) by mouth every 6 (six) hours as needed. 30 tablet 0   lidocaine-prilocaine (EMLA) cream Apply to affected area once 30 g 3   LORazepam (ATIVAN) 0.5 MG tablet Take 1 tablet (0.5 mg total) by mouth every 8 (eight) hours. 30 tablet 0   magic mouthwash (multi-ingredient) oral suspension Take 5 mLs by mouth 4 (four) times daily as needed. 300 mL 0   magic mouthwash w/lidocaine SOLN Take 5 mLs by mouth 4 (four) times daily. 480 mL 3   metroNIDAZOLE (METROCREAM) 0.75 % cream Apply topically as needed. (Patient taking differently: Apply 1 Application topically as needed.)     ondansetron (ZOFRAN) 8 MG tablet Take 1 tablet (8 mg total) by mouth every 8 (eight) hours as needed for nausea or vomiting. 30 tablet 1   OVER THE COUNTER MEDICATION 1.5 tablets in the morning and at bedtime. Calcium Bone Strength Supplement1     OVER THE COUNTER MEDICATION 2 tablets daily. Vitex Berry     OVER THE COUNTER MEDICATION in the morning, at noon, and at bedtime. Milk Thisle Seeds Tea     prochlorperazine (COMPAZINE) 10 MG tablet Take 1 tablet (10 mg total) by mouth every 6 (six) hours as needed for nausea or vomiting. 30 tablet 1   triamcinolone cream (KENALOG) 0.1 % Apply 1 Application topically as needed.     No current facility-administered medications for this encounter.    ECOG PERFORMANCE STATUS:  0 - Asymptomatic  REVIEW OF SYSTEMS: Patient denies any weight loss, fatigue, weakness, fever,  chills or night sweats. Patient denies any loss of vision, blurred vision. Patient denies any ringing  of the ears or hearing loss. No irregular heartbeat. Patient denies heart murmur or history of fainting. Patient denies any chest pain or pain radiating to her upper extremities. Patient denies any shortness of breath, difficulty breathing at night, cough or hemoptysis. Patient denies any swelling in the lower legs. Patient denies any nausea vomiting, vomiting of blood, or coffee ground material in the vomitus. Patient denies any stomach pain. Patient states has had normal bowel movements no significant constipation or diarrhea. Patient denies any dysuria, hematuria or significant nocturia. Patient denies any problems walking, swelling in the joints or loss of balance. Patient denies any skin changes, loss of hair or loss of weight. Patient denies any excessive worrying or anxiety or significant depression. Patient denies any problems with insomnia. Patient denies excessive thirst, polyuria, polydipsia. Patient denies any swollen glands, patient denies easy bruising or easy bleeding. Patient denies any recent infections, allergies or URI. Patient "s visual fields  have not changed significantly in recent time.   PHYSICAL EXAM: BP 115/65   Pulse 75   Temp 98 F (36.7 C)   Resp 14   Ht 5\' 4"  (1.626 m)   Wt 114 lb 1.6 oz (51.8 kg)   BMI 19.59 kg/m  Patient has bilateral breast implants.  She is status post excision of left breast which is healed well.  No dominant masses noted in either breast.  No axillary or supraclavicular adenopathy identified.  Well-developed well-nourished patient in NAD. HEENT reveals PERLA, EOMI, discs not visualized.  Oral cavity is clear. No oral mucosal lesions are identified. Neck is clear without evidence of cervical or supraclavicular adenopathy. Lungs are clear to A&P. Cardiac examination is essentially unremarkable with regular rate and rhythm without murmur rub or thrill.  Abdomen is benign with no organomegaly or masses noted. Motor sensory and DTR levels are equal and symmetric in the upper and lower extremities. Cranial nerves II through XII are grossly intact. Proprioception is intact. No peripheral adenopathy or edema is identified. No motor or sensory levels are noted. Crude visual fields are within normal range.  LABORATORY DATA: Pathology report reviewed    RADIOLOGY RESULTS: Mammogram and ultrasound reviewed compatible with above-stated findings   IMPRESSION: Stage Ia ER/PR negative HER2/neu overexpressed invasive mammary carcinoma of the left breast status post wide local excision undergoing adjuvant Taxol and Herceptin.  In 62 year old female  PLAN: At this time patient will complete her Taxol.  She is scheduled for a vacation and we will work around that.  Will get her back in at the end of July for simulation.  I would recommend 3 weeks of whole breast deep inspiration breath-hold technique boosting her scar another 1000 centigrade using photon beam therapy.  Risks and benefits of treatment including skin reaction fatigue alteration blood counts possible inclusion of superficial lung all were described in detail to the patient.  Patient comprehends my recommendations well.  She will continue Herceptin after completion of radiation and during treatment.  I would like to take this opportunity to thank you for allowing me to participate in the care of your patient.Carmina Miller, MD

## 2022-09-16 MED FILL — Dexamethasone Sodium Phosphate Inj 100 MG/10ML: INTRAMUSCULAR | Qty: 1 | Status: AC

## 2022-09-19 ENCOUNTER — Encounter: Payer: Self-pay | Admitting: Internal Medicine

## 2022-09-19 ENCOUNTER — Inpatient Hospital Stay: Payer: 59

## 2022-09-19 ENCOUNTER — Inpatient Hospital Stay (HOSPITAL_BASED_OUTPATIENT_CLINIC_OR_DEPARTMENT_OTHER): Payer: 59 | Admitting: Internal Medicine

## 2022-09-19 VITALS — BP 119/60 | HR 87 | Temp 99.0°F | Resp 16 | Ht 64.0 in | Wt 115.0 lb

## 2022-09-19 DIAGNOSIS — Z5112 Encounter for antineoplastic immunotherapy: Secondary | ICD-10-CM

## 2022-09-19 DIAGNOSIS — C50912 Malignant neoplasm of unspecified site of left female breast: Secondary | ICD-10-CM

## 2022-09-19 DIAGNOSIS — D0512 Intraductal carcinoma in situ of left breast: Secondary | ICD-10-CM | POA: Diagnosis not present

## 2022-09-19 DIAGNOSIS — Z5111 Encounter for antineoplastic chemotherapy: Secondary | ICD-10-CM | POA: Diagnosis not present

## 2022-09-19 DIAGNOSIS — Z171 Estrogen receptor negative status [ER-]: Secondary | ICD-10-CM

## 2022-09-19 LAB — CMP (CANCER CENTER ONLY)
ALT: 15 U/L (ref 0–44)
AST: 20 U/L (ref 15–41)
Albumin: 3.7 g/dL (ref 3.5–5.0)
Alkaline Phosphatase: 42 U/L (ref 38–126)
Anion gap: 6 (ref 5–15)
BUN: 18 mg/dL (ref 8–23)
CO2: 24 mmol/L (ref 22–32)
Calcium: 8.7 mg/dL — ABNORMAL LOW (ref 8.9–10.3)
Chloride: 107 mmol/L (ref 98–111)
Creatinine: 0.87 mg/dL (ref 0.44–1.00)
GFR, Estimated: >60 mL/min (ref >60)
Glucose, Bld: 117 mg/dL — ABNORMAL HIGH (ref 70–99)
Potassium: 3.8 mmol/L (ref 3.5–5.1)
Sodium: 137 mmol/L (ref 135–145)
Total Bilirubin: 0.7 mg/dL (ref 0.3–1.2)
Total Protein: 6.3 g/dL — ABNORMAL LOW (ref 6.5–8.1)

## 2022-09-19 LAB — CBC WITH DIFFERENTIAL (CANCER CENTER ONLY)
Abs Immature Granulocytes: 0.02 10*3/uL (ref 0.00–0.07)
Basophils Absolute: 0.1 10*3/uL (ref 0.0–0.1)
Basophils Relative: 2 %
Eosinophils Absolute: 0.2 10*3/uL (ref 0.0–0.5)
Eosinophils Relative: 4 %
HCT: 34 % — ABNORMAL LOW (ref 36.0–46.0)
Hemoglobin: 11.2 g/dL — ABNORMAL LOW (ref 12.0–15.0)
Immature Granulocytes: 0 %
Lymphocytes Relative: 28 %
Lymphs Abs: 1.3 10*3/uL (ref 0.7–4.0)
MCH: 30.2 pg (ref 26.0–34.0)
MCHC: 32.9 g/dL (ref 30.0–36.0)
MCV: 91.6 fL (ref 80.0–100.0)
Monocytes Absolute: 0.3 10*3/uL (ref 0.1–1.0)
Monocytes Relative: 5 %
Neutro Abs: 2.9 10*3/uL (ref 1.7–7.7)
Neutrophils Relative %: 61 %
Platelet Count: 278 10*3/uL (ref 150–400)
RBC: 3.71 MIL/uL — ABNORMAL LOW (ref 3.87–5.11)
RDW: 14.5 % (ref 11.5–15.5)
WBC Count: 4.8 10*3/uL (ref 4.0–10.5)
nRBC: 0 % (ref 0.0–0.2)

## 2022-09-19 MED ORDER — FAMOTIDINE IN NACL 20-0.9 MG/50ML-% IV SOLN
20.0000 mg | Freq: Once | INTRAVENOUS | Status: AC
  Administered 2022-09-19: 20 mg via INTRAVENOUS
  Filled 2022-09-19: qty 50

## 2022-09-19 MED ORDER — SODIUM CHLORIDE 0.9 % IV SOLN
Freq: Once | INTRAVENOUS | Status: AC
  Filled 2022-09-19: qty 250

## 2022-09-19 MED ORDER — HEPARIN SOD (PORK) LOCK FLUSH 100 UNIT/ML IV SOLN
500.0000 [IU] | Freq: Once | INTRAVENOUS | Status: AC | PRN
  Administered 2022-09-19: 500 [IU]
  Filled 2022-09-19: qty 5

## 2022-09-19 MED ORDER — SODIUM CHLORIDE 0.9 % IV SOLN
10.0000 mg | Freq: Once | INTRAVENOUS | Status: AC
  Administered 2022-09-19: 10 mg via INTRAVENOUS
  Filled 2022-09-19: qty 10

## 2022-09-19 MED ORDER — TRASTUZUMAB-ANNS CHEMO 150 MG IV SOLR
2.0000 mg/kg | Freq: Once | INTRAVENOUS | Status: AC
  Administered 2022-09-19: 105 mg via INTRAVENOUS
  Filled 2022-09-19: qty 5

## 2022-09-19 MED ORDER — SODIUM CHLORIDE 0.9 % IV SOLN
80.0000 mg/m2 | Freq: Once | INTRAVENOUS | Status: AC
  Administered 2022-09-19: 120 mg via INTRAVENOUS
  Filled 2022-09-19: qty 20

## 2022-09-19 MED ORDER — ACETAMINOPHEN 325 MG PO TABS
650.0000 mg | ORAL_TABLET | Freq: Once | ORAL | Status: AC
  Administered 2022-09-19: 650 mg via ORAL
  Filled 2022-09-19: qty 2

## 2022-09-19 MED ORDER — DIPHENHYDRAMINE HCL 50 MG/ML IJ SOLN
50.0000 mg | Freq: Once | INTRAMUSCULAR | Status: AC
  Administered 2022-09-19: 50 mg via INTRAVENOUS
  Filled 2022-09-19: qty 1

## 2022-09-19 NOTE — Patient Instructions (Signed)
[facility]  Discharge Instructions: Thank you for choosing [facility] to provide your oncology and hematology care.  If you have a lab appointment with the Cancer Center, please go directly to the Cancer Center and check in at the registration area.  Wear comfortable clothing and clothing appropriate for easy access to any Portacath or PICC line.   We strive to give you quality time with your provider. You may need to reschedule your appointment if you arrive late (15 or more minutes).  Arriving late affects you and other patients whose appointments are after yours.  Also, if you miss three or more appointments without notifying the office, you may be dismissed from the clinic at the provider's discretion.      For prescription refill requests, have your pharmacy contact our office and allow 72 hours for refills to be completed.    Today you received the following chemotherapy and/or immunotherapy agents Kanjinti & Taxol      To help prevent nausea and vomiting after your treatment, we encourage you to take your nausea medication as directed.  BELOW ARE SYMPTOMS THAT SHOULD BE REPORTED IMMEDIATELY: *FEVER GREATER THAN 100.4 F (38 C) OR HIGHER *CHILLS OR SWEATING *NAUSEA AND VOMITING THAT IS NOT CONTROLLED WITH YOUR NAUSEA MEDICATION *UNUSUAL SHORTNESS OF BREATH *UNUSUAL BRUISING OR BLEEDING *URINARY PROBLEMS (pain or burning when urinating, or frequent urination) *BOWEL PROBLEMS (unusual diarrhea, constipation, pain near the anus) TENDERNESS IN MOUTH AND THROAT WITH OR WITHOUT PRESENCE OF ULCERS (sore throat, sores in mouth, or a toothache) UNUSUAL RASH, SWELLING OR PAIN  UNUSUAL VAGINAL DISCHARGE OR ITCHING   Items with * indicate a potential emergency and should be followed up as soon as possible or go to the Emergency Department if any problems should occur.  Please show the CHEMOTHERAPY ALERT CARD or IMMUNOTHERAPY ALERT CARD at  check-in to the Emergency Department and triage nurse.  Should you have questions after your visit or need to cancel or reschedule your appointment, please contact [facility]  [phone] and follow the prompts.  Office hours are 8:00 a.m. to 4:30 p.m. Monday - Friday. Please note that voicemails left after 4:00 p.m. may not be returned until the following business day.  We are closed weekends and major holidays. You have access to a nurse at all times for urgent questions. Please call the main number to the clinic [phone] and follow the prompts.  For any non-urgent questions, you may also contact your provider using MyChart. We now offer e-Visits for anyone 18 and older to request care online for non-urgent symptoms. For details visit [email or web address].   Also download the MyChart app! Go to the app store, search "MyChart", open the app, select [facility], and log in with your MyChart username and password.    

## 2022-09-19 NOTE — Progress Notes (Signed)
[facility] CONSULT NOTE  Patient Care Team: Lorre Munroe, NP as PCP - General (Internal Medicine) Hulen Luster, RN as Oncology Nurse Navigator   CANCER STAGING   Cancer Staging  Breast cancer [facility]) Staging form: Breast, AJCC 8th Edition - Clinical stage from 05/17/2022: Stage IA (cT1b, cN0, cM0, G3, ER-, PR-, HER2+) - Signed by Michaelyn Barter, MD on 05/27/2022 Stage prefix: Initial diagnosis Histologic grading system: 3 grade system  Current treatment Weekly paclitaxel with Herceptin (APT regimen) started on 07/18/2022  ASSESSMENT & PLAN:  Jennifer Garrison 62 y.o. female with pmh of osteoporosis and anxiety was referred to medical oncology for management of left breast stage IA invasive ductal cancer.  # Left breast IDC stage IA, ER/PR negative, HER2 positive # Encounter for cardiac monitoring -Patient felt a palpable mass in her left breast.   -S/p left breast lumpectomy with SLNB by Dr. Tonna Boehringer on 06/09/2022. Pathology showed 1.4 cm IDC, grade 3, DCIS present grade 2-3 with comedonecrosis, 0/3 lymph nodes negative, margins negative, pT1c.  ER/PR negative.  HER2 positive  -Echocardiogram done on 07/14/2022 showed EF of 50 to 55%.  No regional wall motion abnormalities.  Repeat echo is scheduled for July 30 to monitor cardiac function on Herceptin.  -Labs today and acceptable for treatment.  Will proceed with cycle 3-day 8 of Taxol and Herceptin.  She was seen by radiation oncology who plans to start radiation treatment after she comes back from the vacation in July.  -Patient reports small pea-sized around the nipple area in the left breast.  Seems like it was there before when she had mammogram in February but had not felt it for a while and now felt it again recently.  I will obtain left breast diagnostic mammogram to evaluate the etiology concern.  # Abdominal spasm -improved. Dicyclomine 10 mg twice daily as needed helping with abdominal spasm.  #Constipation  -  taking colace. If needed, can add miralax   # Anxiety -Worsened with the need of chemotherapy. -Continue with Ativan 0.5 mg every 8 hours as needed.  On Wellbutrin.  # Decreased appetite -Started on [employer] supplements.  Follows with nutrition.  # Chemo induced mucositis -Continue with Magic mouthwash as needed.  # Evaluated by genetics-testing negative  # Access-port.  Placed by Dr. Tonna Boehringer on 06/30/2022.  # Osteoporosis -On alendronate  Orders Placed This Encounter  Procedures   US Breast Limited Uni Left Inc Axilla    Standing Status:   Future    Standing Expiration Date:   09/19/2023    Order Specific Question:   Reason for Exam (SYMPTOM  OR DIAGNOSIS REQUIRED)    Answer:   evaluate palpable mass around nipple    Order Specific Question:   Preferred imaging location?    Answer:   [facility]   MM DIAG BREAST TOMO BILATERAL    Standing Status:   Future    Standing Expiration Date:   09/19/2023    Order Specific Question:   Reason for Exam (SYMPTOM  OR DIAGNOSIS REQUIRED)    Answer:   evaluate palpable mass around nipple    Order Specific Question:   Preferred imaging location?    Answer:   [facility]   RTC in 1 week for MD visit, labs, cycle 3 day 15 of Taxol and Herceptin.  The total time spent in the appointment was 30 minutes encounter with patients including review of chart and various tests results, discussions about plan of care and  coordination of care plan   All questions were answered. The patient knows to call the clinic with any problems, questions or concerns. No barriers to learning was detected.  Michaelyn Barter, MD 6/24/20241:33 PM   HISTORY OF PRESENTING ILLNESS:  Jennifer Garrison 62 y.o. female with pmh of osteoporosis on alendronate and anxiety was referred to medical oncology for further management of stage Ia left breast invasive ductal cancer HER2 positive  Patient felt a palpable mass in her left breast mid February 2024.  She sought  medical attention from PCP.  Further imaging as below.  INTERVAL HISTORY- Patient was seen today accompanied by husband prior to cycle 3 day 8 of Taxol and Herceptin.  Patient is tolerating treatment well.  Noticed pea-sized area of lump around the nipple in the left breast.  She tells me that it was stated before when she was initially diagnosed with breast cancer but now it has been more noticeable.  Otherwise doing well with the treatment.  I have reviewed her chart and materials related to her cancer extensively and collaborated history with the patient. Summary of oncologic history is as follows: Oncology History  Breast cancer (HCC)  05/11/2022 Mammogram   Diagnostic mammogram and ultrasound- FINDINGS: Full field and spot compression views of the LEFT breast demonstrate a 1 cm irregular mass within the UPPER INNER LEFT breast, at the site of patient concern. The patient has retropectoral implants.   On physical exam, a firm palpable mass identified at the 11 o'clock position of the LEFT breast 8 cm from the nipple.   Targeted ultrasound is performed, showing a 0.9 x 0.8 x 1 cm irregular hypoechoic mass at the 11 o'clock position of the LEFT breast 8 cm from the nipple.   No abnormal LEFT axillary lymph nodes are noted.   IMPRESSION: 1. Suspicious 1 cm UPPER INNER LEFT breast mass. Tissue sampling is recommended. 2. No abnormal appearing LEFT axillary lymph nodes.   05/17/2022 Cancer Staging   Staging form: Breast, AJCC 8th Edition - Clinical stage from 05/17/2022: Stage IA (cT1b, cN0, cM0, G3, ER-, PR-, HER2+) - Signed by Michaelyn Barter, MD on 05/27/2022 Stage prefix: Initial diagnosis Histologic grading system: 3 grade system   05/17/2022 Initial Biopsy   DIAGNOSIS:  A. BREAST, LEFT, 11:00, 8 CM FROM NIPPLE, ULTRASOUND-GUIDED BIOPSY:   - INVASIVE MAMMARY CARCINOMA, NOS DUCTAL.   Size of invasive carcinoma: 10 mm in this sample  Histologic grade of invasive carcinoma:  Grade 3                       Glandular/tubular differentiation score: 3                       Nuclear pleomorphism score: 3                       Mitotic rate score: 2                       Total score: 8  Ductal carcinoma in situ: Not identified  Lymphovascular invasion: Not identified   Estrogen Receptor (ER) Status: NEGATIVE (LESS THAN 1%)   Progesterone Receptor (PgR) Status: NEGATIVE (LESS THAN 1%)   HER2 (by immunohistochemistry): POSITIVE (Score 3+)       Percentage of cells with uniform intense complete membrane  staining: 80%  Ki-67: Not performed     Genetic Testing   No pathogenic variants  identified on the [facility] Multi-Cancer+RNA panel. VUS in MUTYH called c.1484G>A identified. The report date is 06/09/2022.  The Multi-Cancer + RNA Panel offered by [facility] includes sequencing and/or deletion/duplication analysis of the following 70 genes:  AIP*, ALK, APC*, ATM*, AXIN2*, BAP1*, BARD1*, BLM*, BMPR1A*, BRCA1*, BRCA2*, BRIP1*, CDC73*, CDH1*, CDK4, CDKN1B*, CDKN2A, CHEK2*, CTNNA1*, DICER1*, EPCAM, EGFR, FH*, FLCN*, GREM1, HOXB13, KIT, LZTR1, MAX*, MBD4, MEN1*, MET, MITF, MLH1*, MSH2*, MSH3*, MSH6*, MUTYH*, NF1*, NF2*, NTHL1*, PALB2*, PDGFRA, PMS2*, POLD1*, POLE*, POT1*, PRKAR1A*, PTCH1*, PTEN*, RAD51C*, RAD51D*, RB1*, RET, SDHA*, SDHAF2*, SDHB*, SDHC*, SDHD*, SMAD4*, SMARCA4*, SMARCB1*, SMARCE1*, STK11*, SUFU*, TMEM127*, TP53*, TSC1*, TSC2*, VHL*. RNA analysis is performed for * genes.   06/09/2022 Surgery   S/p left breast lumpectomy with SLNB by Dr. Tonna Boehringer  Pathology showed 1.4 cm IDC, grade 3, DCIS present grade 2-3 with comedonecrosis, 0/3 lymph nodes negative, margins negative, pT1c.  ER/PR negative.  HER2 positive   07/18/2022 -  Chemotherapy   Patient is on Treatment Plan : BREAST Paclitaxel + Trastuzumab q7d / Trastuzumab q21d      Menarche age 31 or 78 Age at first birth 16 Used birth control pills and Depo more than 5 years Menopause was age 32 Hysterectomy yes for  prolapsed uterus HRT no History of breast biopsies yes for breast cyst in left lower Family history of lung cancer in mother who was smoker  MEDICAL HISTORY:  Past Medical History:  Diagnosis Date   Allergy    Anxiety    Arthritis    Breast cancer (HCC) 05/27/2022   IBS (irritable bowel syndrome)    Trigger finger    Left Middle Finger 11/2016    SURGICAL HISTORY: Past Surgical History:  Procedure Laterality Date   ABDOMINAL HYSTERECTOMY  02/2016   Only cervix remains (done for prolapse)   AUGMENTATION MAMMAPLASTY Bilateral 2010   BREAST BIOPSY Left 05/17/2022   [place] BX, ribbon clip, path pending   BREAST BIOPSY Left 05/17/2022   [place] LT BREAST BX W LOC DEV 1ST LESION IMG BX SPEC US GUIDE 05/17/2022 [facility]   BREAST CYST ASPIRATION Left 2010   neg   BREAST ENHANCEMENT SURGERY  1998   CHOLECYSTECTOMY     COLONOSCOPY WITH PROPOFOL N/A 10/10/2019   Procedure: COLONOSCOPY WITH PROPOFOL;  Surgeon: Wyline Mood, MD;  Location: [facility];  Service: Gastroenterology;  Laterality: N/A;   PARTIAL MASTECTOMY WITH AXILLARY SENTINEL LYMPH NODE BIOPSY Left 06/09/2022   Procedure: PARTIAL MASTECTOMY WITH AXILLARY SENTINEL LYMPH NODE BIOPSY;  Surgeon: Sung Amabile, DO;  Location: [facility] ORS;  Service: General;  Laterality: Left;   PORTACATH PLACEMENT N/A 06/30/2022   Procedure: INSERTION PORT-A-CATH;  Surgeon: Sung Amabile, DO;  Location: [facility] ORS;  Service: General;  Laterality: N/A;   TYMPANOPLASTY Left     SOCIAL HISTORY: Social History   Socioeconomic History   Marital status: Married    Spouse name: Onalee Hua   Number of children: 1   Years of education: Not on file   Highest education level: Not on file  Occupational History   Not on file  Tobacco Use   Smoking status: Former    Types: Cigarettes    Quit date: 09/09/1992    Years since quitting: 30.0   Smokeless tobacco: Current    Types: Chew   Tobacco comments:    Chewing tobacco   Vaping Use   Vaping Use: Former   Substance and Sexual Activity   Alcohol use: Not Currently   Drug use: Yes    Types: Marijuana  Comment: daily   Sexual activity: Yes    Birth control/protection: None  Other Topics Concern   Not on file  Social History Narrative   Not on file   Social Determinants of Health   Financial Resource Strain: Not on file  Food Insecurity: No Food Insecurity (05/27/2022)   Hunger Vital Sign    Worried About Running Out of Food in the Last Year: Never true    Ran Out of Food in the Last Year: Never true  Transportation Needs: No Transportation Needs (05/27/2022)   PRAPARE - Administrator, Civil Service (Medical): No    Lack of Transportation (Non-Medical): No  Physical Activity: Not on file  Stress: Not on file  Social Connections: Not on file  Intimate Partner Violence: Not At Risk (05/27/2022)   Humiliation, Afraid, Rape, and Kick questionnaire    Fear of Current or Ex-Partner: No    Emotionally Abused: No    Physically Abused: No    Sexually Abused: No    FAMILY HISTORY: Family History  Problem Relation Age of Onset   Lung cancer Mother 39   Hypertension Mother    Lupus Father    Leukemia Cousin 6   Breast cancer Neg Hx     ALLERGIES:  is allergic to poison ivy extract.  MEDICATIONS:  Current Outpatient Medications  Medication Sig Dispense Refill   ADVANCED FIBER COMPLEX PO Take 3 tablets by mouth daily. Gummies     alendronate (FOSAMAX) 70 MG tablet Take 70 mg by mouth once a week.     buPROPion (WELLBUTRIN SR) 150 MG 12 hr tablet TAKE 1 TABLET TWICE A DAY 180 tablet 1   busPIRone (BUSPAR) 5 MG tablet Take 1 tablet (5 mg total) by mouth 2 (two) times daily as needed. 180 tablet 1   HYDROcodone-acetaminophen (NORCO) 5-325 MG tablet Take 1 tablet by mouth every 6 (six) hours as needed for up to 6 doses for moderate pain. 6 tablet 0   ibuprofen (ADVIL) 600 MG tablet Take 1 tablet (600 mg total) by mouth every 6 (six) hours as needed. 30 tablet 0    lidocaine-prilocaine (EMLA) cream Apply to affected area once 30 g 3   LORazepam (ATIVAN) 0.5 MG tablet Take 1 tablet (0.5 mg total) by mouth every 8 (eight) hours. 30 tablet 0   magic mouthwash (multi-ingredient) oral suspension Take 5 mLs by mouth 4 (four) times daily as needed. 300 mL 0   magic mouthwash w/lidocaine SOLN Take 5 mLs by mouth 4 (four) times daily. 480 mL 3   metroNIDAZOLE (METROCREAM) 0.75 % cream Apply topically as needed. (Patient taking differently: Apply 1 Application topically as needed.)     ondansetron (ZOFRAN) 8 MG tablet Take 1 tablet (8 mg total) by mouth every 8 (eight) hours as needed for nausea or vomiting. 30 tablet 1   OVER THE COUNTER MEDICATION 1.5 tablets in the morning and at bedtime. Calcium Bone Strength Supplement1     OVER THE COUNTER MEDICATION 2 tablets daily. Vitex Berry     OVER THE COUNTER MEDICATION in the morning, at noon, and at bedtime. Milk Thisle Seeds Tea     prochlorperazine (COMPAZINE) 10 MG tablet Take 1 tablet (10 mg total) by mouth every 6 (six) hours as needed for nausea or vomiting. 30 tablet 1   triamcinolone cream (KENALOG) 0.1 % Apply 1 Application topically as needed.     dicyclomine (BENTYL) 10 MG capsule Take 1 capsule (10 mg total) by mouth  3 (three) times daily as needed for up to 10 days for spasms. 30 capsule 0   No current facility-administered medications for this visit.    REVIEW OF SYSTEMS:   Pertinent information mentioned in HPI All other systems were reviewed with the patient and are negative.  PHYSICAL EXAMINATION: ECOG PERFORMANCE STATUS: 0 - Asymptomatic  Vitals:   09/19/22 0924  BP: 119/60  Pulse: 87  Resp: 16  Temp: 99 F (37.2 C)  SpO2: 100%       Filed Weights   09/19/22 0924  Weight: 115 lb (52.2 kg)       GENERAL:alert, no distress and comfortable SKIN: skin color, texture, turgor are normal, no rashes or significant lesions EYES: normal, conjunctiva are pink and non-injected, sclera  clear OROPHARYNX:no exudate, no erythema and lips, buccal mucosa, and tongue normal  NECK: supple, thyroid normal size, non-tender, without nodularity LYMPH:  no palpable lymphadenopathy in the cervical, axillary or inguinal LUNGS: clear to auscultation and percussion with normal breathing effort HEART: regular rate & rhythm and no murmurs and no lower extremity edema ABDOMEN:abdomen soft, non-tender and normal bowel sounds Musculoskeletal:no cyanosis of digits and no clubbing  PSYCH: alert & oriented x 3 with fluent speech NEURO: no focal motor/sensory deficits  LABORATORY DATA:  I have reviewed the data as listed Lab Results  Component Value Date   WBC 4.8 09/19/2022   HGB 11.2 (L) 09/19/2022   HCT 34.0 (L) 09/19/2022   MCV 91.6 09/19/2022   PLT 278 09/19/2022   Recent Labs    09/05/22 0837 09/12/22 0908 09/19/22 0913  NA 138 137 137  K 3.7 3.9 3.8  CL 106 106 107  CO2 25 25 24   GLUCOSE 113* 127* 117*  BUN 17 19 18   CREATININE 0.82 0.87 0.87  CALCIUM 8.7* 8.9 8.7*  GFRNONAA >60 >60 >60  PROT 6.5 6.3* 6.3*  ALBUMIN 3.6 3.7 3.7  AST 23 21 20   ALT 25 18 15   ALKPHOS 45 46 42  BILITOT 0.5 0.6 0.7    RADIOGRAPHIC STUDIES: I have personally reviewed the radiological images as listed and agreed with the findings in the report. No results found.

## 2022-09-19 NOTE — Progress Notes (Signed)
Found lump around Left nipple. Noticed it yesterday. Having diarrhea that gets worse a few days after treatments. Mouth sores

## 2022-09-20 ENCOUNTER — Other Ambulatory Visit: Payer: Self-pay

## 2022-09-20 ENCOUNTER — Other Ambulatory Visit: Payer: Self-pay | Admitting: Internal Medicine

## 2022-09-20 MED ORDER — STERILE WATER FOR INJECTION IJ SOLN
5.0000 mL | Freq: Four times a day (QID) | OROMUCOSAL | 0 refills | Status: DC | PRN
  Filled 2022-09-20: qty 150, 8d supply, fill #0

## 2022-09-21 ENCOUNTER — Other Ambulatory Visit: Payer: Self-pay

## 2022-09-22 ENCOUNTER — Other Ambulatory Visit: Payer: Self-pay

## 2022-09-22 ENCOUNTER — Telehealth: Payer: Self-pay

## 2022-09-22 NOTE — Telephone Encounter (Signed)
The pt was notified that base of her last pap smear from 09/17/2019 it was normal and negative for HPV. She would not be due to repeat until 09/16/2024. I verified this information with the patient PCP.

## 2022-09-22 NOTE — Telephone Encounter (Signed)
Copied from CRM [id number]. Topic: General - Inquiry >> Sep 20, 2022  9:49 AM Patsy Lager T wrote: Reason for CRM: patient said she got a message to schedule pap. Unsure if provider perform paps or not. Please contact patient to let her know if she would need to reach out to OB/GYN or if she can schedule appt with provider

## 2022-09-23 MED FILL — Dexamethasone Sodium Phosphate Inj 100 MG/10ML: INTRAMUSCULAR | Qty: 1 | Status: AC

## 2022-09-26 ENCOUNTER — Inpatient Hospital Stay: Payer: 59

## 2022-09-26 ENCOUNTER — Inpatient Hospital Stay: Payer: 59 | Attending: Internal Medicine | Admitting: Internal Medicine

## 2022-09-26 ENCOUNTER — Encounter: Payer: Self-pay | Admitting: Internal Medicine

## 2022-09-26 VITALS — BP 127/63 | HR 76 | Temp 97.6°F | Wt 116.9 lb

## 2022-09-26 DIAGNOSIS — Z5111 Encounter for antineoplastic chemotherapy: Secondary | ICD-10-CM | POA: Insufficient documentation

## 2022-09-26 DIAGNOSIS — C50912 Malignant neoplasm of unspecified site of left female breast: Secondary | ICD-10-CM

## 2022-09-26 DIAGNOSIS — K1231 Oral mucositis (ulcerative) due to antineoplastic therapy: Secondary | ICD-10-CM | POA: Diagnosis not present

## 2022-09-26 DIAGNOSIS — F419 Anxiety disorder, unspecified: Secondary | ICD-10-CM | POA: Insufficient documentation

## 2022-09-26 DIAGNOSIS — Z79899 Other long term (current) drug therapy: Secondary | ICD-10-CM | POA: Diagnosis not present

## 2022-09-26 DIAGNOSIS — Z171 Estrogen receptor negative status [ER-]: Secondary | ICD-10-CM | POA: Diagnosis not present

## 2022-09-26 DIAGNOSIS — D0512 Intraductal carcinoma in situ of left breast: Secondary | ICD-10-CM | POA: Diagnosis present

## 2022-09-26 LAB — CMP (CANCER CENTER ONLY)
ALT: 13 U/L (ref 0–44)
AST: 17 U/L (ref 15–41)
Albumin: 3.7 g/dL (ref 3.5–5.0)
Alkaline Phosphatase: 45 U/L (ref 38–126)
Anion gap: 6 (ref 5–15)
BUN: 18 mg/dL (ref 8–23)
CO2: 24 mmol/L (ref 22–32)
Calcium: 8.2 mg/dL — ABNORMAL LOW (ref 8.9–10.3)
Chloride: 107 mmol/L (ref 98–111)
Creatinine: 0.79 mg/dL (ref 0.44–1.00)
GFR, Estimated: >60 mL/min (ref >60)
Glucose, Bld: 113 mg/dL — ABNORMAL HIGH (ref 70–99)
Potassium: 3.9 mmol/L (ref 3.5–5.1)
Sodium: 137 mmol/L (ref 135–145)
Total Bilirubin: 0.3 mg/dL (ref 0.3–1.2)
Total Protein: 6.3 g/dL — ABNORMAL LOW (ref 6.5–8.1)

## 2022-09-26 LAB — CBC WITH DIFFERENTIAL (CANCER CENTER ONLY)
Abs Immature Granulocytes: 0.04 10*3/uL (ref 0.00–0.07)
Basophils Absolute: 0.1 10*3/uL (ref 0.0–0.1)
Basophils Relative: 1 %
Eosinophils Absolute: 0.2 10*3/uL (ref 0.0–0.5)
Eosinophils Relative: 4 %
HCT: 33.5 % — ABNORMAL LOW (ref 36.0–46.0)
Hemoglobin: 11 g/dL — ABNORMAL LOW (ref 12.0–15.0)
Immature Granulocytes: 1 %
Lymphocytes Relative: 29 %
Lymphs Abs: 1.5 10*3/uL (ref 0.7–4.0)
MCH: 30.1 pg (ref 26.0–34.0)
MCHC: 32.8 g/dL (ref 30.0–36.0)
MCV: 91.8 fL (ref 80.0–100.0)
Monocytes Absolute: 0.3 10*3/uL (ref 0.1–1.0)
Monocytes Relative: 6 %
Neutro Abs: 3 10*3/uL (ref 1.7–7.7)
Neutrophils Relative %: 59 %
Platelet Count: 295 10*3/uL (ref 150–400)
RBC: 3.65 MIL/uL — ABNORMAL LOW (ref 3.87–5.11)
RDW: 14.6 % (ref 11.5–15.5)
WBC Count: 5 10*3/uL (ref 4.0–10.5)
nRBC: 0 % (ref 0.0–0.2)

## 2022-09-26 MED ORDER — SODIUM CHLORIDE 0.9% FLUSH
10.0000 mL | INTRAVENOUS | Status: DC | PRN
  Administered 2022-09-26: 10 mL
  Filled 2022-09-26: qty 10

## 2022-09-26 MED ORDER — SODIUM CHLORIDE 0.9 % IV SOLN
80.0000 mg/m2 | Freq: Once | INTRAVENOUS | Status: AC
  Administered 2022-09-26: 120 mg via INTRAVENOUS
  Filled 2022-09-26: qty 20

## 2022-09-26 MED ORDER — DIPHENHYDRAMINE HCL 50 MG/ML IJ SOLN
50.0000 mg | Freq: Once | INTRAMUSCULAR | Status: AC
  Administered 2022-09-26: 50 mg via INTRAVENOUS
  Filled 2022-09-26: qty 1

## 2022-09-26 MED ORDER — SODIUM CHLORIDE 0.9 % IV SOLN
Freq: Once | INTRAVENOUS | Status: AC
  Filled 2022-09-26: qty 250

## 2022-09-26 MED ORDER — FAMOTIDINE IN NACL 20-0.9 MG/50ML-% IV SOLN
20.0000 mg | Freq: Once | INTRAVENOUS | Status: AC
  Administered 2022-09-26: 20 mg via INTRAVENOUS
  Filled 2022-09-26: qty 50

## 2022-09-26 MED ORDER — HEPARIN SOD (PORK) LOCK FLUSH 100 UNIT/ML IV SOLN
500.0000 [IU] | Freq: Once | INTRAVENOUS | Status: AC | PRN
  Administered 2022-09-26: 500 [IU]
  Filled 2022-09-26: qty 5

## 2022-09-26 MED ORDER — SODIUM CHLORIDE 0.9 % IV SOLN
10.0000 mg | Freq: Once | INTRAVENOUS | Status: AC
  Administered 2022-09-26: 10 mg via INTRAVENOUS
  Filled 2022-09-26: qty 10

## 2022-09-26 MED ORDER — TRASTUZUMAB-ANNS CHEMO 150 MG IV SOLR
2.0000 mg/kg | Freq: Once | INTRAVENOUS | Status: AC
  Administered 2022-09-26: 105 mg via INTRAVENOUS
  Filled 2022-09-26: qty 5

## 2022-09-26 MED ORDER — ACETAMINOPHEN 325 MG PO TABS
650.0000 mg | ORAL_TABLET | Freq: Once | ORAL | Status: AC
  Administered 2022-09-26: 650 mg via ORAL
  Filled 2022-09-26: qty 2

## 2022-09-26 NOTE — Progress Notes (Signed)
Patient is having diarrhea lasting longer, but is about the same as severity. Her nail beds have started to look like they are splitting.

## 2022-09-26 NOTE — Patient Instructions (Signed)
[facility]  Discharge Instructions: Thank you for choosing [facility] to provide your oncology and hematology care.  If you have a lab appointment with the Cancer Center, please go directly to the Cancer Center and check in at the registration area.  Wear comfortable clothing and clothing appropriate for easy access to any Portacath or PICC line.   We strive to give you quality time with your provider. You may need to reschedule your appointment if you arrive late (15 or more minutes).  Arriving late affects you and other patients whose appointments are after yours.  Also, if you miss three or more appointments without notifying the office, you may be dismissed from the clinic at the provider's discretion.      For prescription refill requests, have your pharmacy contact our office and allow 72 hours for refills to be completed.    Today you received the following chemotherapy and/or immunotherapy agents- Trastuzumab, Taxol      To help prevent nausea and vomiting after your treatment, we encourage you to take your nausea medication as directed.  BELOW ARE SYMPTOMS THAT SHOULD BE REPORTED IMMEDIATELY: *FEVER GREATER THAN 100.4 F (38 C) OR HIGHER *CHILLS OR SWEATING *NAUSEA AND VOMITING THAT IS NOT CONTROLLED WITH YOUR NAUSEA MEDICATION *UNUSUAL SHORTNESS OF BREATH *UNUSUAL BRUISING OR BLEEDING *URINARY PROBLEMS (pain or burning when urinating, or frequent urination) *BOWEL PROBLEMS (unusual diarrhea, constipation, pain near the anus) TENDERNESS IN MOUTH AND THROAT WITH OR WITHOUT PRESENCE OF ULCERS (sore throat, sores in mouth, or a toothache) UNUSUAL RASH, SWELLING OR PAIN  UNUSUAL VAGINAL DISCHARGE OR ITCHING   Items with * indicate a potential emergency and should be followed up as soon as possible or go to the Emergency Department if any problems should occur.  Please show the CHEMOTHERAPY ALERT CARD or IMMUNOTHERAPY ALERT CARD at  check-in to the Emergency Department and triage nurse.  Should you have questions after your visit or need to cancel or reschedule your appointment, please contact [facility]  [phone] and follow the prompts.  Office hours are 8:00 a.m. to 4:30 p.m. Monday - Friday. Please note that voicemails left after 4:00 p.m. may not be returned until the following business day.  We are closed weekends and major holidays. You have access to a nurse at all times for urgent questions. Please call the main number to the clinic [phone] and follow the prompts.  For any non-urgent questions, you may also contact your provider using MyChart. We now offer e-Visits for anyone 18 and older to request care online for non-urgent symptoms. For details visit [email or web address].   Also download the MyChart app! Go to the app store, search "MyChart", open the app, select [facility], and log in with your MyChart username and password.    

## 2022-09-26 NOTE — Patient Instructions (Signed)

## 2022-09-26 NOTE — Progress Notes (Signed)
Jennifer Garrison  Patient Care Team: Lorre Munroe, NP as PCP - General (Internal Medicine) Hulen Luster, RN as Oncology Nurse Navigator   CANCER STAGING   Cancer Staging  Breast cancer [facility]) Staging form: Breast, AJCC 8th Edition - Clinical stage from 05/17/2022: Stage IA (cT1b, cN0, cM0, G3, ER-, PR-, HER2+) - Signed by Michaelyn Barter, MD on 05/27/2022 Stage prefix: Initial diagnosis Histologic grading system: 3 grade system  Current treatment Weekly paclitaxel with Herceptin (APT regimen) started on 07/18/2022  ASSESSMENT & PLAN:  Jennifer Garrison 62 y.o. female with pmh of osteoporosis and anxiety was referred to medical oncology for management of left breast stage IA invasive ductal cancer.  # Left breast IDC stage IA, ER/PR negative, HER2 positive # Encounter for cardiac monitoring -Patient felt a palpable mass in her left breast.   -S/p left breast lumpectomy with SLNB by Dr. Tonna Boehringer on 06/09/2022. Pathology showed 1.4 cm IDC, grade 3, DCIS present grade 2-3 with comedonecrosis, 0/3 lymph nodes negative, margins negative, pT1c.  ER/PR negative.  HER2 positive  -Echocardiogram done on 07/14/2022 showed EF of 50 to 55%.  No regional wall motion abnormalities.  Repeat echo is scheduled for July 30 to monitor cardiac function on Herceptin.  -Labs today and acceptable for treatment.  Will proceed with cycle 3-day 15 of Taxol and Herceptin.  She was seen by radiation oncology who plans to start radiation treatment after she comes back from the vacation in July.  Next week she will complete Taxol.  And then will be transition to single agent Herceptin every 3 weeks for total of 1 year.  -Patient reports small pea-sized around the nipple area in the left breast.  Seems like it was there before when she had mammogram in February but had not felt it for a while and now felt it again recently.  Scheduled for left breast diagnostic mammogram on July 10.  # Tenderness in  nailbeds -Likely secondary to chemotherapy.  Continue supportive care with moisturizer, use Tylenol as needed. -Okay to use ice packs.  # Abdominal spasm -improved. Dicyclomine 10 mg twice daily as needed helping with abdominal spasm.  #Constipation  - taking colace. If needed, can add miralax   # Anxiety -Worsened with the need of chemotherapy. -Continue with Ativan 0.5 mg every 8 hours as needed.  On Wellbutrin.  # Decreased appetite -Started on [employer] supplements.  Follows with nutrition.  # Chemo induced mucositis -Continue with Magic mouthwash as needed.  # Evaluated by genetics-testing negative  # Access-port.  Placed by Dr. Tonna Boehringer on 06/30/2022.  # Osteoporosis -On alendronate  Orders Placed This Encounter  Procedures   CBC with Differential/Platelet    Standing Status:   Future    Standing Expiration Date:   09/26/2023   Comprehensive metabolic panel    Standing Status:   Future    Standing Expiration Date:   09/26/2023   RTC in 1 week for MD visit, labs, cycle 3 day 22 of Taxol and Herceptin.  The total time spent in the appointment was 30 minutes encounter with patients including review of chart and various tests results, discussions about plan of care and coordination of care plan   All questions were answered. The patient knows to call the clinic with any problems, questions or concerns. No barriers to learning was detected.  Michaelyn Barter, MD 7/1/202411:58 AM   HISTORY OF PRESENTING ILLNESS:  Jennifer Garrison 62 y.o. female with pmh of osteoporosis on  alendronate and anxiety was referred to medical oncology for further management of stage Ia left breast invasive ductal cancer HER2 positive  Patient felt a palpable mass in her left breast mid February 2024.  She sought medical attention from PCP.  Further imaging as below.  INTERVAL HISTORY- Patient was seen today accompanied by husband prior to cycle 3 day 15 of Taxol and Herceptin.  Patient is tolerating  treatment well.  Reports tenderness in her nailbeds and fingers and in the great toes which started about a week ago.  Bothers her when she does some activity.  Energy is fair.  Diarrhea is lasting a little longer.  Imodium helps.  I have reviewed her chart and materials related to her cancer extensively and collaborated history with the patient. Summary of oncologic history is as follows: Oncology History  Breast cancer (HCC)  05/11/2022 Mammogram   Diagnostic mammogram and ultrasound- FINDINGS: Full field and spot compression views of the LEFT breast demonstrate a 1 cm irregular mass within the UPPER INNER LEFT breast, at the site of patient concern. The patient has retropectoral implants.   On physical exam, a firm palpable mass identified at the 11 o'clock position of the LEFT breast 8 cm from the nipple.   Targeted ultrasound is performed, showing a 0.9 x 0.8 x 1 cm irregular hypoechoic mass at the 11 o'clock position of the LEFT breast 8 cm from the nipple.   No abnormal LEFT axillary lymph nodes are noted.   IMPRESSION: 1. Suspicious 1 cm UPPER INNER LEFT breast mass. Tissue sampling is recommended. 2. No abnormal appearing LEFT axillary lymph nodes.   05/17/2022 Cancer Staging   Staging form: Breast, AJCC 8th Edition - Clinical stage from 05/17/2022: Stage IA (cT1b, cN0, cM0, G3, ER-, PR-, HER2+) - Signed by Michaelyn Barter, MD on 05/27/2022 Stage prefix: Initial diagnosis Histologic grading system: 3 grade system   05/17/2022 Initial Biopsy   DIAGNOSIS:  A. BREAST, LEFT, 11:00, 8 CM FROM NIPPLE, ULTRASOUND-GUIDED BIOPSY:   - INVASIVE MAMMARY CARCINOMA, NOS DUCTAL.   Size of invasive carcinoma: 10 mm in this sample  Histologic grade of invasive carcinoma: Grade 3                       Glandular/tubular differentiation score: 3                       Nuclear pleomorphism score: 3                       Mitotic rate score: 2                       Total score: 8  Ductal  carcinoma in situ: Not identified  Lymphovascular invasion: Not identified   Estrogen Receptor (ER) Status: NEGATIVE (LESS THAN 1%)   Progesterone Receptor (PgR) Status: NEGATIVE (LESS THAN 1%)   HER2 (by immunohistochemistry): POSITIVE (Score 3+)       Percentage of cells with uniform intense complete membrane  staining: 80%  Ki-67: Not performed     Genetic Testing   No pathogenic variants identified on the [facility] Multi-Cancer+RNA panel. VUS in MUTYH called c.[id number] identified. The report date is 06/09/2022.  The Multi-Cancer + RNA Panel offered by [facility] includes sequencing and/or deletion/duplication analysis of the following 70 genes:  AIP*, ALK, APC*, ATM*, AXIN2*, BAP1*, BARD1*, BLM*, BMPR1A*, BRCA1*, BRCA2*, BRIP1*, CDC73*, CDH1*, CDK4, CDKN1B*, CDKN2A, CHEK2*,  CTNNA1*, DICER1*, EPCAM, EGFR, FH*, FLCN*, GREM1, HOXB13, KIT, LZTR1, MAX*, MBD4, MEN1*, MET, MITF, MLH1*, MSH2*, MSH3*, MSH6*, MUTYH*, NF1*, NF2*, NTHL1*, PALB2*, PDGFRA, PMS2*, POLD1*, POLE*, POT1*, PRKAR1A*, PTCH1*, PTEN*, RAD51C*, RAD51D*, RB1*, RET, SDHA*, SDHAF2*, SDHB*, SDHC*, SDHD*, SMAD4*, SMARCA4*, SMARCB1*, SMARCE1*, STK11*, SUFU*, TMEM127*, TP53*, TSC1*, TSC2*, VHL*. RNA analysis is performed for * genes.   06/09/2022 Surgery   S/p left breast lumpectomy with SLNB by Dr. Tonna Boehringer  Pathology showed 1.4 cm IDC, grade 3, DCIS present grade 2-3 with comedonecrosis, 0/3 lymph nodes negative, margins negative, pT1c.  ER/PR negative.  HER2 positive   07/18/2022 -  Chemotherapy   Patient is on Treatment Plan : BREAST Paclitaxel + Trastuzumab q7d / Trastuzumab q21d      Menarche age 89 or 37 Age at first birth 69 Used birth control pills and Depo more than 5 years Menopause was age 25 Hysterectomy yes for prolapsed uterus HRT no History of breast biopsies yes for breast cyst in left lower Family history of lung cancer in mother who was smoker  MEDICAL HISTORY:  Past Medical History:  Diagnosis Date   Allergy     Anxiety    Arthritis    Breast cancer (HCC) 05/27/2022   IBS (irritable bowel syndrome)    Trigger finger    Left Middle Finger 11/2016    SURGICAL HISTORY: Past Surgical History:  Procedure Laterality Date   ABDOMINAL HYSTERECTOMY  02/2016   Only cervix remains (done for prolapse)   AUGMENTATION MAMMAPLASTY Bilateral 2010   BREAST BIOPSY Left 05/17/2022   [place] BX, ribbon clip, path pending   BREAST BIOPSY Left 05/17/2022   [place] LT BREAST BX W LOC DEV 1ST LESION IMG BX SPEC US GUIDE 05/17/2022 [facility]   BREAST CYST ASPIRATION Left 2010   neg   BREAST ENHANCEMENT SURGERY  1998   CHOLECYSTECTOMY     COLONOSCOPY WITH PROPOFOL N/A 10/10/2019   Procedure: COLONOSCOPY WITH PROPOFOL;  Surgeon: Wyline Mood, MD;  [facility];  Service: Gastroenterology;  Laterality: N/A;   PARTIAL MASTECTOMY WITH AXILLARY SENTINEL LYMPH NODE BIOPSY Left 06/09/2022   Procedure: PARTIAL MASTECTOMY WITH AXILLARY SENTINEL LYMPH NODE BIOPSY;  Surgeon: Sung Amabile, DO;  Location: [facility] ORS;  Service: General;  Laterality: Left;   PORTACATH PLACEMENT N/A 06/30/2022   Procedure: INSERTION PORT-A-CATH;  Surgeon: Sung Amabile, DO;  Location: [facility] ORS;  Service: General;  Laterality: N/A;   TYMPANOPLASTY Left     SOCIAL HISTORY: Social History   Socioeconomic History   Marital status: Married    Spouse name: Onalee Hua   Number of children: 1   Years of education: Not on file   Highest education level: Not on file  Occupational History   Not on file  Tobacco Use   Smoking status: Former    Types: Cigarettes    Quit date: 09/09/1992    Years since quitting: 30.0   Smokeless tobacco: Current    Types: Chew   Tobacco comments:    Chewing tobacco   Vaping Use   Vaping Use: Former  Substance and Sexual Activity   Alcohol use: Not Currently   Drug use: Yes    Types: Marijuana    Comment: daily   Sexual activity: Yes    Birth control/protection: None  Other Topics Concern   Not on file   Social History Narrative   Not on file   Social Determinants of Health   Financial Resource Strain: Not on file  Food Insecurity: No Food Insecurity (05/27/2022)  Hunger Vital Sign    Worried About Running Out of Food in the Last Year: Never true    Ran Out of Food in the Last Year: Never true  Transportation Needs: No Transportation Needs (05/27/2022)   PRAPARE - Administrator, Civil Service (Medical): No    Lack of Transportation (Non-Medical): No  Physical Activity: Not on file  Stress: Not on file  Social Connections: Not on file  Intimate Partner Violence: Not At Risk (05/27/2022)   Humiliation, Afraid, Rape, and Kick questionnaire    Fear of Current or Ex-Partner: No    Emotionally Abused: No    Physically Abused: No    Sexually Abused: No    FAMILY HISTORY: Family History  Problem Relation Age of Onset   Lung cancer Mother 15   Hypertension Mother    Lupus Father    Leukemia Cousin 6   Breast cancer Neg Hx     ALLERGIES:  is allergic to poison ivy extract.  MEDICATIONS:  Current Outpatient Medications  Medication Sig Dispense Refill   ADVANCED FIBER COMPLEX PO Take 3 tablets by mouth daily. Gummies     alendronate (FOSAMAX) 70 MG tablet Take 70 mg by mouth once a week.     buPROPion (WELLBUTRIN SR) 150 MG 12 hr tablet TAKE 1 TABLET TWICE A DAY 180 tablet 1   busPIRone (BUSPAR) 5 MG tablet Take 1 tablet (5 mg total) by mouth 2 (two) times daily as needed. 180 tablet 1   HYDROcodone-acetaminophen (NORCO) 5-325 MG tablet Take 1 tablet by mouth every 6 (six) hours as needed for up to 6 doses for moderate pain. 6 tablet 0   ibuprofen (ADVIL) 600 MG tablet Take 1 tablet (600 mg total) by mouth every 6 (six) hours as needed. 30 tablet 0   lidocaine-prilocaine (EMLA) cream Apply to affected area once 30 g 3   LORazepam (ATIVAN) 0.5 MG tablet Take 1 tablet (0.5 mg total) by mouth every 8 (eight) hours. 30 tablet 0   magic mouthwash (multi-ingredient) oral  suspension Take 5 mLs by mouth 4 (four) times daily as needed. 300 mL 0   magic mouthwash w/lidocaine SOLN Take 5 mLs by mouth 4 (four) times daily. 480 mL 3   metroNIDAZOLE (METROCREAM) 0.75 % cream Apply topically as needed. (Patient taking differently: Apply 1 Application topically as needed.)     ondansetron (ZOFRAN) 8 MG tablet Take 1 tablet (8 mg total) by mouth every 8 (eight) hours as needed for nausea or vomiting. 30 tablet 1   OVER THE COUNTER MEDICATION 1.5 tablets in the morning and at bedtime. Calcium Bone Strength Supplement1     OVER THE COUNTER MEDICATION 2 tablets daily. Vitex Berry     OVER THE COUNTER MEDICATION in the morning, at noon, and at bedtime. Milk Thisle Seeds Tea     prochlorperazine (COMPAZINE) 10 MG tablet Take 1 tablet (10 mg total) by mouth every 6 (six) hours as needed for nausea or vomiting. 30 tablet 1   triamcinolone cream (KENALOG) 0.1 % Apply 1 Application topically as needed.     dicyclomine (BENTYL) 10 MG capsule Take 1 capsule (10 mg total) by mouth 3 (three) times daily as needed for up to 10 days for spasms. 30 capsule 0   No current facility-administered medications for this visit.   Facility-Administered Medications Ordered in Other Visits  Medication Dose Route Frequency Provider Last Rate Last Admin   famotidine (PEPCID) IVPB 20 mg premix  20 mg  Intravenous Once Michaelyn Barter, MD 200 mL/hr at 09/26/22 1147 20 mg at 09/26/22 1147   heparin lock flush 100 unit/mL  500 Units Intracatheter Once PRN Michaelyn Barter, MD       PACLitaxel (TAXOL) 120 mg in sodium chloride 0.9 % 250 mL chemo infusion (</= 80mg /m2)  80 mg/m2 (Treatment Plan Recorded) Intravenous Once Michaelyn Barter, MD       sodium chloride flush (NS) 0.9 % injection 10 mL  10 mL Intracatheter PRN Michaelyn Barter, MD       trastuzumab-[facility]) 105 mg in sodium chloride 0.9 % 250 mL chemo infusion  2 mg/kg (Treatment Plan Recorded) Intravenous Once Michaelyn Barter, MD         REVIEW OF SYSTEMS:   Pertinent information mentioned in HPI All other systems were reviewed with the patient and are negative.  PHYSICAL EXAMINATION: ECOG PERFORMANCE STATUS: 0 - Asymptomatic  Vitals:   09/26/22 1020  BP: 127/63  Pulse: 76  Temp: 97.6 F (36.4 C)  SpO2: 98%        Filed Weights   09/26/22 1020  Weight: 116 lb 14.4 oz (53 kg)        GENERAL:alert, no distress and comfortable SKIN: skin color, texture, turgor are normal, no rashes or significant lesions EYES: normal, conjunctiva are pink and non-injected, sclera clear OROPHARYNX:no exudate, no erythema and lips, buccal mucosa, and tongue normal  NECK: supple, thyroid normal size, non-tender, without nodularity LYMPH:  no palpable lymphadenopathy in the cervical, axillary or inguinal LUNGS: clear to auscultation and percussion with normal breathing effort HEART: regular rate & rhythm and no murmurs and no lower extremity edema ABDOMEN:abdomen soft, non-tender and normal bowel sounds Musculoskeletal:no cyanosis of digits and no clubbing  PSYCH: alert & oriented x 3 with fluent speech NEURO: no focal motor/sensory deficits  LABORATORY DATA:  I have reviewed the data as listed Lab Results  Component Value Date   WBC 5.0 09/26/2022   HGB 11.0 (L) 09/26/2022   HCT 33.5 (L) 09/26/2022   MCV 91.8 09/26/2022   PLT 295 09/26/2022   Recent Labs    09/12/22 0908 09/19/22 0913 09/26/22 0957  NA 137 137 137  K 3.9 3.8 3.9  CL 106 107 107  CO2 25 24 24   GLUCOSE 127* 117* 113*  BUN 19 18 18   CREATININE 0.87 0.87 0.79  CALCIUM 8.9 8.7* 8.2*  GFRNONAA >60 >60 >60  PROT 6.3* 6.3* 6.3*  ALBUMIN 3.7 3.7 3.7  AST 21 20 17   ALT 18 15 13   ALKPHOS 46 42 45  BILITOT 0.6 0.7 0.3    RADIOGRAPHIC STUDIES: I have personally reviewed the radiological images as listed and agreed with the findings in the report. No results found.

## 2022-09-27 ENCOUNTER — Encounter: Payer: Self-pay | Admitting: Internal Medicine

## 2022-09-27 ENCOUNTER — Other Ambulatory Visit: Payer: Self-pay

## 2022-09-27 ENCOUNTER — Other Ambulatory Visit: Payer: Self-pay | Admitting: *Deleted

## 2022-09-27 MED ORDER — LOPERAMIDE HCL 2 MG PO CAPS: 2.0000 mg | ORAL_CAPSULE | ORAL | 0 refills | Status: AC | PRN

## 2022-09-27 NOTE — Telephone Encounter (Signed)
Pt only has 5 pills left. Is requesting a refill as she is still having some diarrhea.

## 2022-09-30 MED FILL — Dexamethasone Sodium Phosphate Inj 100 MG/10ML: INTRAMUSCULAR | Qty: 1 | Status: AC

## 2022-10-03 ENCOUNTER — Inpatient Hospital Stay: Payer: 59

## 2022-10-03 ENCOUNTER — Inpatient Hospital Stay (HOSPITAL_BASED_OUTPATIENT_CLINIC_OR_DEPARTMENT_OTHER): Payer: 59 | Admitting: Internal Medicine

## 2022-10-03 ENCOUNTER — Encounter: Payer: Self-pay | Admitting: *Deleted

## 2022-10-03 VITALS — BP 111/73 | HR 70 | Temp 97.6°F | Wt 118.0 lb

## 2022-10-03 DIAGNOSIS — C50912 Malignant neoplasm of unspecified site of left female breast: Secondary | ICD-10-CM

## 2022-10-03 DIAGNOSIS — Z5111 Encounter for antineoplastic chemotherapy: Secondary | ICD-10-CM | POA: Diagnosis not present

## 2022-10-03 DIAGNOSIS — Z171 Estrogen receptor negative status [ER-]: Secondary | ICD-10-CM

## 2022-10-03 DIAGNOSIS — Z5112 Encounter for antineoplastic immunotherapy: Secondary | ICD-10-CM

## 2022-10-03 DIAGNOSIS — D0512 Intraductal carcinoma in situ of left breast: Secondary | ICD-10-CM | POA: Diagnosis not present

## 2022-10-03 LAB — CBC WITH DIFFERENTIAL (CANCER CENTER ONLY)
Abs Immature Granulocytes: 0.03 10*3/uL (ref 0.00–0.07)
Basophils Absolute: 0.1 10*3/uL (ref 0.0–0.1)
Basophils Relative: 2 %
Eosinophils Absolute: 0.2 10*3/uL (ref 0.0–0.5)
Eosinophils Relative: 3 %
HCT: 33.3 % — ABNORMAL LOW (ref 36.0–46.0)
Hemoglobin: 11.1 g/dL — ABNORMAL LOW (ref 12.0–15.0)
Immature Granulocytes: 1 %
Lymphocytes Relative: 28 %
Lymphs Abs: 1.7 10*3/uL (ref 0.7–4.0)
MCH: 30.5 pg (ref 26.0–34.0)
MCHC: 33.3 g/dL (ref 30.0–36.0)
MCV: 91.5 fL (ref 80.0–100.0)
Monocytes Absolute: 0.4 10*3/uL (ref 0.1–1.0)
Monocytes Relative: 6 %
Neutro Abs: 3.7 10*3/uL (ref 1.7–7.7)
Neutrophils Relative %: 60 %
Platelet Count: 306 10*3/uL (ref 150–400)
RBC: 3.64 MIL/uL — ABNORMAL LOW (ref 3.87–5.11)
RDW: 15.2 % (ref 11.5–15.5)
WBC Count: 6.1 10*3/uL (ref 4.0–10.5)
nRBC: 0 % (ref 0.0–0.2)

## 2022-10-03 LAB — CMP (CANCER CENTER ONLY)
ALT: 13 U/L (ref 0–44)
AST: 19 U/L (ref 15–41)
Albumin: 3.6 g/dL (ref 3.5–5.0)
Alkaline Phosphatase: 40 U/L (ref 38–126)
Anion gap: 6 (ref 5–15)
BUN: 17 mg/dL (ref 8–23)
CO2: 24 mmol/L (ref 22–32)
Calcium: 8.5 mg/dL — ABNORMAL LOW (ref 8.9–10.3)
Chloride: 107 mmol/L (ref 98–111)
Creatinine: 0.86 mg/dL (ref 0.44–1.00)
GFR, Estimated: >60 mL/min (ref >60)
Glucose, Bld: 133 mg/dL — ABNORMAL HIGH (ref 70–99)
Potassium: 3.6 mmol/L (ref 3.5–5.1)
Sodium: 137 mmol/L (ref 135–145)
Total Bilirubin: 0.4 mg/dL (ref 0.3–1.2)
Total Protein: 6.5 g/dL (ref 6.5–8.1)

## 2022-10-03 MED ORDER — FAMOTIDINE IN NACL 20-0.9 MG/50ML-% IV SOLN
20.0000 mg | Freq: Once | INTRAVENOUS | Status: AC
  Administered 2022-10-03: 20 mg via INTRAVENOUS
  Filled 2022-10-03: qty 50

## 2022-10-03 MED ORDER — ACETAMINOPHEN 325 MG PO TABS
650.0000 mg | ORAL_TABLET | Freq: Once | ORAL | Status: AC
  Administered 2022-10-03: 650 mg via ORAL
  Filled 2022-10-03: qty 2

## 2022-10-03 MED ORDER — TRASTUZUMAB-ANNS CHEMO 150 MG IV SOLR
2.0000 mg/kg | Freq: Once | INTRAVENOUS | Status: AC
  Administered 2022-10-03: 105 mg via INTRAVENOUS
  Filled 2022-10-03: qty 5

## 2022-10-03 MED ORDER — SODIUM CHLORIDE 0.9% FLUSH
10.0000 mL | Freq: Once | INTRAVENOUS | Status: AC
  Administered 2022-10-03: 10 mL via INTRAVENOUS
  Filled 2022-10-03: qty 10

## 2022-10-03 MED ORDER — DIPHENHYDRAMINE HCL 50 MG/ML IJ SOLN
50.0000 mg | Freq: Once | INTRAMUSCULAR | Status: AC
  Administered 2022-10-03: 50 mg via INTRAVENOUS
  Filled 2022-10-03: qty 1

## 2022-10-03 MED ORDER — HEPARIN SOD (PORK) LOCK FLUSH 100 UNIT/ML IV SOLN
500.0000 [IU] | Freq: Once | INTRAVENOUS | Status: AC | PRN
  Administered 2022-10-03: 500 [IU]
  Filled 2022-10-03: qty 5

## 2022-10-03 MED ORDER — SODIUM CHLORIDE 0.9 % IV SOLN
Freq: Once | INTRAVENOUS | Status: AC
  Filled 2022-10-03: qty 250

## 2022-10-03 MED ORDER — SODIUM CHLORIDE 0.9 % IV SOLN
80.0000 mg/m2 | Freq: Once | INTRAVENOUS | Status: AC
  Administered 2022-10-03: 120 mg via INTRAVENOUS
  Filled 2022-10-03: qty 20

## 2022-10-03 MED ORDER — SODIUM CHLORIDE 0.9 % IV SOLN
10.0000 mg | Freq: Once | INTRAVENOUS | Status: AC
  Administered 2022-10-03: 10 mg via INTRAVENOUS
  Filled 2022-10-03: qty 10

## 2022-10-03 NOTE — Progress Notes (Signed)
Patient has question about the herseptin.

## 2022-10-03 NOTE — Patient Instructions (Signed)
[facility] [place] [facility]  Discharge Instructions: Thank you for choosing [facility] to provide your oncology and hematology care.  If you have a lab appointment with the Cancer Center, please go directly to the Cancer Center and check in at the registration area.  Wear comfortable clothing and clothing appropriate for easy access to any Portacath or PICC line.   We strive to give you quality time with your provider. You may need to reschedule your appointment if you arrive late (15 or more minutes).  Arriving late affects you and other patients whose appointments are after yours.  Also, if you miss three or more appointments without notifying the office, you may be dismissed from the clinic at the provider's discretion.      For prescription refill requests, have your pharmacy contact our office and allow 72 hours for refills to be completed.    Today you received the following chemotherapy and/or immunotherapy agents Kanjinti & Taxol      To help prevent nausea and vomiting after your treatment, we encourage you to take your nausea medication as directed.  BELOW ARE SYMPTOMS THAT SHOULD BE REPORTED IMMEDIATELY: *FEVER GREATER THAN 100.4 F (38 C) OR HIGHER *CHILLS OR SWEATING *NAUSEA AND VOMITING THAT IS NOT CONTROLLED WITH YOUR NAUSEA MEDICATION *UNUSUAL SHORTNESS OF BREATH *UNUSUAL BRUISING OR BLEEDING *URINARY PROBLEMS (pain or burning when urinating, or frequent urination) *BOWEL PROBLEMS (unusual diarrhea, constipation, pain near the anus) TENDERNESS IN MOUTH AND THROAT WITH OR WITHOUT PRESENCE OF ULCERS (sore throat, sores in mouth, or a toothache) UNUSUAL RASH, SWELLING OR PAIN  UNUSUAL VAGINAL DISCHARGE OR ITCHING   Items with * indicate a potential emergency and should be followed up as soon as possible or go to the Emergency Department if any problems should occur.  Please show the CHEMOTHERAPY ALERT CARD or IMMUNOTHERAPY ALERT CARD at  check-in to the Emergency Department and triage nurse.  Should you have questions after your visit or need to cancel or reschedule your appointment, please contact [facility] [place] [facility]  [phone] and follow the prompts.  Office hours are 8:00 a.m. to 4:30 p.m. Monday - Friday. Please note that voicemails left after 4:00 p.m. may not be returned until the following business day.  We are closed weekends and major holidays. You have access to a nurse at all times for urgent questions. Please call the main number to the clinic [phone] and follow the prompts.  For any non-urgent questions, you may also contact your provider using MyChart. We now offer e-Visits for anyone 18 and older to request care online for non-urgent symptoms. For details visit [email or web address].   Also download the MyChart app! Go to the app store, search "MyChart", open the app, select [place], and log in with your MyChart username and password.    

## 2022-10-03 NOTE — Progress Notes (Signed)
[facility] CONSULT NOTE  Patient Care Team: Lorre Munroe, NP as PCP - General (Internal Medicine) Hulen Luster, RN as Oncology Nurse Navigator   CANCER STAGING   Cancer Staging  Breast cancer [facility]) Staging form: Breast, AJCC 8th Edition - Clinical stage from 05/17/2022: Stage IA (cT1b, cN0, cM0, G3, ER-, PR-, HER2+) - Signed by Jennifer Barter, MD on 05/27/2022 Stage prefix: Initial diagnosis Histologic grading system: 3 grade system  Current treatment Weekly paclitaxel with Herceptin (APT regimen) started on 07/18/2022  ASSESSMENT & PLAN:  Jennifer Garrison 62 y.o. female with pmh of osteoporosis and anxiety was referred to medical oncology for management of left breast stage IA invasive ductal cancer.  # Left breast IDC stage IA, ER/PR negative, HER2 positive # Encounter for cardiac monitoring -Patient felt a palpable mass in her left breast.   -S/p left breast lumpectomy with SLNB by Dr. Tonna Boehringer on 06/09/2022. Pathology showed 1.4 cm IDC, grade 3, DCIS present grade 2-3 with comedonecrosis, 0/3 lymph nodes negative, margins negative, pT1c.  ER/PR negative.  HER2 positive  -Echocardiogram done on 07/14/2022 showed EF of 50 to 55%.  No regional wall motion abnormalities.  Repeat echo is scheduled for July 30 to monitor cardiac function on Herceptin.  -Labs today and acceptable for treatment.  Will proceed with cycle 3-day 22 of Taxol and Herceptin.  Today's last cycle of Taxol.  She will transition to single agent Herceptin every 3 weeks starting next week.  -Patient reports small pea-sized around the nipple area in the left breast.  Seems like it was there before when she had mammogram in February but had not felt it for a while and now felt it again recently.  Scheduled for left breast diagnostic mammogram on July 10.  -Scheduled for CT simulation on July 17.  # Tenderness in nailbeds -Likely secondary to chemotherapy.  Continue supportive care with moisturizer, use  Tylenol as needed. -Okay to use ice packs.  # Abdominal spasm -improved. Dicyclomine 10 mg twice daily as needed helping with abdominal spasm.  #Constipation  - taking colace. If needed, can add miralax   # Anxiety -Worsened with the need of chemotherapy. -Continue with Ativan 0.5 mg every 8 hours as needed.  On Wellbutrin.  # Decreased appetite -Started on [employer] supplements.  Follows with nutrition.  # Chemo induced mucositis -Continue with Magic mouthwash as needed.  # Evaluated by genetics-testing negative  # Access-port.  Placed by Dr. Tonna Boehringer on 06/30/2022.  # Osteoporosis -On alendronate  No orders of the defined types were placed in this encounter.  RTC in 1 week for MD visit, labs, Herceptin  The total time spent in the appointment was 30 minutes encounter with patients including review of chart and various tests results, discussions about plan of care and coordination of care plan   All questions were answered. The patient knows to call the clinic with any problems, questions or concerns. No barriers to learning was detected.  Jennifer Barter, MD 7/8/202412:34 PM   HISTORY OF PRESENTING ILLNESS:  Jennifer Garrison 62 y.o. female with pmh of osteoporosis on alendronate and anxiety was referred to medical oncology for further management of stage Ia left breast invasive ductal cancer HER2 positive  Patient felt a palpable mass in her left breast mid February 2024.  She sought medical attention from PCP.  Further imaging as below.  INTERVAL HISTORY- Patient was seen today accompanied by husband prior to cycle 3 day 15 of Taxol and Herceptin.  Patient is tolerating treatment well.  Reports tenderness in her nailbeds and fingers and in the great toes which started about a week ago.  Bothers her when she does some activity.  Energy is fair.  Diarrhea is lasting a little longer.  Imodium helps.  I have reviewed her chart and materials related to her cancer extensively and  collaborated history with the patient. Summary of oncologic history is as follows: Oncology History  Breast cancer (HCC)  05/11/2022 Mammogram   Diagnostic mammogram and ultrasound- FINDINGS: Full field and spot compression views of the LEFT breast demonstrate a 1 cm irregular mass within the UPPER INNER LEFT breast, at the site of patient concern. The patient has retropectoral implants.   On physical exam, a firm palpable mass identified at the 11 o'clock position of the LEFT breast 8 cm from the nipple.   Targeted ultrasound is performed, showing a 0.9 x 0.8 x 1 cm irregular hypoechoic mass at the 11 o'clock position of the LEFT breast 8 cm from the nipple.   No abnormal LEFT axillary lymph nodes are noted.   IMPRESSION: 1. Suspicious 1 cm UPPER INNER LEFT breast mass. Tissue sampling is recommended. 2. No abnormal appearing LEFT axillary lymph nodes.   05/17/2022 Cancer Staging   Staging form: Breast, AJCC 8th Edition - Clinical stage from 05/17/2022: Stage IA (cT1b, cN0, cM0, G3, ER-, PR-, HER2+) - Signed by Jennifer Barter, MD on 05/27/2022 Stage prefix: Initial diagnosis Histologic grading system: 3 grade system   05/17/2022 Initial Biopsy   DIAGNOSIS:  A. BREAST, LEFT, 11:00, 8 CM FROM NIPPLE, ULTRASOUND-GUIDED BIOPSY:   - INVASIVE MAMMARY CARCINOMA, NOS DUCTAL.   Size of invasive carcinoma: 10 mm in this sample  Histologic grade of invasive carcinoma: Grade 3                       Glandular/tubular differentiation score: 3                       Nuclear pleomorphism score: 3                       Mitotic rate score: 2                       Total score: 8  Ductal carcinoma in situ: Not identified  Lymphovascular invasion: Not identified   Estrogen Receptor (ER) Status: NEGATIVE (LESS THAN 1%)   Progesterone Receptor (PgR) Status: NEGATIVE (LESS THAN 1%)   HER2 (by immunohistochemistry): POSITIVE (Score 3+)       Percentage of cells with uniform intense complete  membrane  staining: 80%  Ki-67: Not performed     Genetic Testing   No pathogenic variants identified on the [facility] Multi-Cancer+RNA panel. VUS in MUTYH called c.1484G>A identified. The report date is 06/09/2022.  The Multi-Cancer + RNA Panel offered by [facility] includes sequencing and/or deletion/duplication analysis of the following 70 genes:  AIP*, ALK, APC*, ATM*, AXIN2*, BAP1*, BARD1*, BLM*, BMPR1A*, BRCA1*, BRCA2*, BRIP1*, CDC73*, CDH1*, CDK4, CDKN1B*, CDKN2A, CHEK2*, CTNNA1*, DICER1*, EPCAM, EGFR, FH*, FLCN*, GREM1, HOXB13, KIT, LZTR1, MAX*, MBD4, MEN1*, MET, MITF, MLH1*, MSH2*, MSH3*, MSH6*, MUTYH*, NF1*, NF2*, NTHL1*, PALB2*, PDGFRA, PMS2*, POLD1*, POLE*, POT1*, PRKAR1A*, PTCH1*, PTEN*, RAD51C*, RAD51D*, RB1*, RET, SDHA*, SDHAF2*, SDHB*, SDHC*, SDHD*, SMAD4*, SMARCA4*, SMARCB1*, SMARCE1*, STK11*, SUFU*, TMEM127*, TP53*, TSC1*, TSC2*, VHL*. RNA analysis is performed for * genes.   06/09/2022 Surgery   S/p left  breast lumpectomy with SLNB by Dr. Tonna Boehringer  Pathology showed 1.4 cm IDC, grade 3, DCIS present grade 2-3 with comedonecrosis, 0/3 lymph nodes negative, margins negative, pT1c.  ER/PR negative.  HER2 positive   07/18/2022 -  Chemotherapy   Patient is on Treatment Plan : BREAST Paclitaxel + Trastuzumab q7d / Trastuzumab q21d      Menarche age 19 or 30 Age at first birth 30 Used birth control pills and Depo more than 5 years Menopause was age 42 Hysterectomy yes for prolapsed uterus HRT no History of breast biopsies yes for breast cyst in left lower Family history of lung cancer in mother who was smoker  MEDICAL HISTORY:  Past Medical History:  Diagnosis Date   Allergy    Anxiety    Arthritis    Breast cancer (HCC) 05/27/2022   IBS (irritable bowel syndrome)    Trigger finger    Left Middle Finger 11/2016    SURGICAL HISTORY: Past Surgical History:  Procedure Laterality Date   ABDOMINAL HYSTERECTOMY  02/2016   Only cervix remains (done for prolapse)   AUGMENTATION  MAMMAPLASTY Bilateral 2010   BREAST BIOPSY Left 05/17/2022   [place] BX, ribbon clip, path pending   BREAST BIOPSY Left 05/17/2022   [place] LT BREAST BX W LOC DEV 1ST LESION IMG BX SPEC US GUIDE 05/17/2022 [facility]   BREAST CYST ASPIRATION Left 2010   neg   BREAST ENHANCEMENT SURGERY  1998   CHOLECYSTECTOMY     COLONOSCOPY WITH PROPOFOL N/A 10/10/2019   Procedure: COLONOSCOPY WITH PROPOFOL;  Surgeon: Wyline Mood, MD;  Location: [facility] ENDOSCOPY;  Service: Gastroenterology;  Laterality: N/A;   PARTIAL MASTECTOMY WITH AXILLARY SENTINEL LYMPH NODE BIOPSY Left 06/09/2022   Procedure: PARTIAL MASTECTOMY WITH AXILLARY SENTINEL LYMPH NODE BIOPSY;  Surgeon: Sung Amabile, DO;  Location: [facility] ORS;  Service: General;  Laterality: Left;   PORTACATH PLACEMENT N/A 06/30/2022   Procedure: INSERTION PORT-A-CATH;  Surgeon: Sung Amabile, DO;  Location: [facility] ORS;  Service: General;  Laterality: N/A;   TYMPANOPLASTY Left     SOCIAL HISTORY: Social History   Socioeconomic History   Marital status: Married    Spouse name: Onalee Hua   Number of children: 1   Years of education: Not on file   Highest education level: Not on file  Occupational History   Not on file  Tobacco Use   Smoking status: Former    Types: Cigarettes    Quit date: 09/09/1992    Years since quitting: 30.0   Smokeless tobacco: Current    Types: Chew   Tobacco comments:    Chewing tobacco   Vaping Use   Vaping Use: Former  Substance and Sexual Activity   Alcohol use: Not Currently   Drug use: Yes    Types: Marijuana    Comment: daily   Sexual activity: Yes    Birth control/protection: None  Other Topics Concern   Not on file  Social History Narrative   Not on file   Social Determinants of Health   Financial Resource Strain: Not on file  Food Insecurity: No Food Insecurity (05/27/2022)   Hunger Vital Sign    Worried About Running Out of Food in the Last Year: Never true    Ran Out of Food in the Last Year: Never true   Transportation Needs: No Transportation Needs (05/27/2022)   PRAPARE - Administrator, Civil Service (Medical): No    Lack of Transportation (Non-Medical): No  Physical Activity: Not on file  Stress: Not on file  Social Connections: Not on file  Intimate Partner Violence: Not At Risk (05/27/2022)   Humiliation, Afraid, Rape, and Kick questionnaire    Fear of Current or Ex-Partner: No    Emotionally Abused: No    Physically Abused: No    Sexually Abused: No    FAMILY HISTORY: Family History  Problem Relation Age of Onset   Lung cancer Mother 45   Hypertension Mother    Lupus Father    Leukemia Cousin 6   Breast cancer Neg Hx     ALLERGIES:  is allergic to poison ivy extract.  MEDICATIONS:  Current Outpatient Medications  Medication Sig Dispense Refill   ADVANCED FIBER COMPLEX PO Take 3 tablets by mouth daily. Gummies     alendronate (FOSAMAX) 70 MG tablet Take 70 mg by mouth once a week.     buPROPion (WELLBUTRIN SR) 150 MG 12 hr tablet TAKE 1 TABLET TWICE A DAY 180 tablet 1   busPIRone (BUSPAR) 5 MG tablet Take 1 tablet (5 mg total) by mouth 2 (two) times daily as needed. 180 tablet 1   HYDROcodone-acetaminophen (NORCO) 5-325 MG tablet Take 1 tablet by mouth every 6 (six) hours as needed for up to 6 doses for moderate pain. 6 tablet 0   ibuprofen (ADVIL) 600 MG tablet Take 1 tablet (600 mg total) by mouth every 6 (six) hours as needed. 30 tablet 0   lidocaine-prilocaine (EMLA) cream Apply to affected area once 30 g 3   loperamide (IMODIUM) 2 MG capsule Take 1 capsule (2 mg total) by mouth every 4 (four) hours as needed for diarrhea or loose stools. Every 4-6 hours as needed 30 capsule 0   LORazepam (ATIVAN) 0.5 MG tablet Take 1 tablet (0.5 mg total) by mouth every 8 (eight) hours. 30 tablet 0   magic mouthwash (multi-ingredient) oral suspension Take 5 mLs by mouth 4 (four) times daily as needed. 300 mL 0   magic mouthwash w/lidocaine SOLN Take 5 mLs by mouth 4 (four)  times daily. 480 mL 3   metroNIDAZOLE (METROCREAM) 0.75 % cream Apply topically as needed. (Patient taking differently: Apply 1 Application topically as needed.)     ondansetron (ZOFRAN) 8 MG tablet Take 1 tablet (8 mg total) by mouth every 8 (eight) hours as needed for nausea or vomiting. 30 tablet 1   OVER THE COUNTER MEDICATION 1.5 tablets in the morning and at bedtime. Calcium Bone Strength Supplement1     OVER THE COUNTER MEDICATION 2 tablets daily. Vitex Berry     OVER THE COUNTER MEDICATION in the morning, at noon, and at bedtime. Milk Thisle Seeds Tea     prochlorperazine (COMPAZINE) 10 MG tablet Take 1 tablet (10 mg total) by mouth every 6 (six) hours as needed for nausea or vomiting. 30 tablet 1   triamcinolone cream (KENALOG) 0.1 % Apply 1 Application topically as needed.     dicyclomine (BENTYL) 10 MG capsule Take 1 capsule (10 mg total) by mouth 3 (three) times daily as needed for up to 10 days for spasms. 30 capsule 0   No current facility-administered medications for this visit.   Facility-Administered Medications Ordered in Other Visits  Medication Dose Route Frequency Provider Last Rate Last Admin   heparin lock flush 100 unit/mL  500 Units Intracatheter Once PRN Jennifer Barter, MD        REVIEW OF SYSTEMS:   Pertinent information mentioned in HPI All other systems were reviewed with the patient and are  negative.  PHYSICAL EXAMINATION: ECOG PERFORMANCE STATUS: 0 - Asymptomatic  Vitals:   10/03/22 0942  BP: 111/73  Pulse: 70  Temp: 97.6 F (36.4 C)  SpO2: 98%        Filed Weights   10/03/22 0942  Weight: 118 lb (53.5 kg)        GENERAL:alert, no distress and comfortable SKIN: skin color, texture, turgor are normal, no rashes or significant lesions EYES: normal, conjunctiva are pink and non-injected, sclera clear OROPHARYNX:no exudate, no erythema and lips, buccal mucosa, and tongue normal  NECK: supple, thyroid normal size, non-tender, without  nodularity LYMPH:  no palpable lymphadenopathy in the cervical, axillary or inguinal LUNGS: clear to auscultation and percussion with normal breathing effort HEART: regular rate & rhythm and no murmurs and no lower extremity edema ABDOMEN:abdomen soft, non-tender and normal bowel sounds Musculoskeletal:no cyanosis of digits and no clubbing  PSYCH: alert & oriented x 3 with fluent speech NEURO: no focal motor/sensory deficits  LABORATORY DATA:  I have reviewed the data as listed Lab Results  Component Value Date   WBC 6.1 10/03/2022   HGB 11.1 (L) 10/03/2022   HCT 33.3 (L) 10/03/2022   MCV 91.5 10/03/2022   PLT 306 10/03/2022   Recent Labs    09/19/22 0913 09/26/22 0957 10/03/22 0912  NA 137 137 137  K 3.8 3.9 3.6  CL 107 107 107  CO2 24 24 24   GLUCOSE 117* 113* 133*  BUN 18 18 17   CREATININE 0.87 0.79 0.86  CALCIUM 8.7* 8.2* 8.5*  GFRNONAA >60 >60 >60  PROT 6.3* 6.3* 6.5  ALBUMIN 3.7 3.7 3.6  AST 20 17 19   ALT 15 13 13   ALKPHOS 42 45 40  BILITOT 0.7 0.3 0.4    RADIOGRAPHIC STUDIES: I have personally reviewed the radiological images as listed and agreed with the findings in the report. No results found.

## 2022-10-05 ENCOUNTER — Ambulatory Visit
Admission: RE | Admit: 2022-10-05 | Discharge: 2022-10-05 | Disposition: A | Discharge status: Home / Self Care | Payer: 59 | Source: Ambulatory Visit | Attending: Internal Medicine | Admitting: Internal Medicine

## 2022-10-05 ENCOUNTER — Other Ambulatory Visit: Payer: Self-pay | Admitting: Internal Medicine

## 2022-10-05 DIAGNOSIS — Z171 Estrogen receptor negative status [ER-]: Secondary | ICD-10-CM | POA: Insufficient documentation

## 2022-10-05 DIAGNOSIS — C50912 Malignant neoplasm of unspecified site of left female breast: Secondary | ICD-10-CM

## 2022-10-05 DIAGNOSIS — Z5112 Encounter for antineoplastic immunotherapy: Secondary | ICD-10-CM

## 2022-10-05 DIAGNOSIS — Z5111 Encounter for antineoplastic chemotherapy: Secondary | ICD-10-CM

## 2022-10-07 ENCOUNTER — Encounter: Payer: 59 | Admitting: Internal Medicine

## 2022-10-07 ENCOUNTER — Other Ambulatory Visit: Payer: Self-pay | Admitting: *Deleted

## 2022-10-07 DIAGNOSIS — C50912 Malignant neoplasm of unspecified site of left female breast: Secondary | ICD-10-CM

## 2022-10-10 ENCOUNTER — Inpatient Hospital Stay: Payer: 59

## 2022-10-10 ENCOUNTER — Inpatient Hospital Stay: Payer: 59 | Admitting: Internal Medicine

## 2022-10-10 ENCOUNTER — Other Ambulatory Visit: Payer: Self-pay

## 2022-10-10 ENCOUNTER — Encounter: Payer: Self-pay | Admitting: Internal Medicine

## 2022-10-10 VITALS — BP 129/66 | HR 60 | Resp 16

## 2022-10-10 VITALS — BP 109/59 | HR 64 | Temp 97.6°F | Resp 19 | Wt 118.4 lb

## 2022-10-10 DIAGNOSIS — C50912 Malignant neoplasm of unspecified site of left female breast: Secondary | ICD-10-CM

## 2022-10-10 DIAGNOSIS — Z5112 Encounter for antineoplastic immunotherapy: Secondary | ICD-10-CM

## 2022-10-10 DIAGNOSIS — Z171 Estrogen receptor negative status [ER-]: Secondary | ICD-10-CM

## 2022-10-10 DIAGNOSIS — D0512 Intraductal carcinoma in situ of left breast: Secondary | ICD-10-CM | POA: Diagnosis not present

## 2022-10-10 LAB — CMP (CANCER CENTER ONLY)
ALT: 13 U/L (ref 0–44)
AST: 16 U/L (ref 15–41)
Albumin: 3.7 g/dL (ref 3.5–5.0)
Alkaline Phosphatase: 42 U/L (ref 38–126)
Anion gap: 6 (ref 5–15)
BUN: 20 mg/dL (ref 8–23)
CO2: 24 mmol/L (ref 22–32)
Calcium: 8.2 mg/dL — ABNORMAL LOW (ref 8.9–10.3)
Chloride: 106 mmol/L (ref 98–111)
Creatinine: 0.88 mg/dL (ref 0.44–1.00)
GFR, Estimated: >60 mL/min (ref >60)
Glucose, Bld: 103 mg/dL — ABNORMAL HIGH (ref 70–99)
Potassium: 4 mmol/L (ref 3.5–5.1)
Sodium: 136 mmol/L (ref 135–145)
Total Bilirubin: 0.5 mg/dL (ref 0.3–1.2)
Total Protein: 6.6 g/dL (ref 6.5–8.1)

## 2022-10-10 LAB — CBC WITH DIFFERENTIAL/PLATELET
Abs Immature Granulocytes: 0.04 10*3/uL (ref 0.00–0.07)
Basophils Absolute: 0.1 10*3/uL (ref 0.0–0.1)
Basophils Relative: 1 %
Eosinophils Absolute: 0.2 10*3/uL (ref 0.0–0.5)
Eosinophils Relative: 3 %
HCT: 32.7 % — ABNORMAL LOW (ref 36.0–46.0)
Hemoglobin: 10.8 g/dL — ABNORMAL LOW (ref 12.0–15.0)
Immature Granulocytes: 1 %
Lymphocytes Relative: 19 %
Lymphs Abs: 1.2 10*3/uL (ref 0.7–4.0)
MCH: 30.8 pg (ref 26.0–34.0)
MCHC: 33 g/dL (ref 30.0–36.0)
MCV: 93.2 fL (ref 80.0–100.0)
Monocytes Absolute: 0.4 10*3/uL (ref 0.1–1.0)
Monocytes Relative: 6 %
Neutro Abs: 4.5 10*3/uL (ref 1.7–7.7)
Neutrophils Relative %: 70 %
Platelets: 294 10*3/uL (ref 150–400)
RBC: 3.51 MIL/uL — ABNORMAL LOW (ref 3.87–5.11)
RDW: 15.6 % — ABNORMAL HIGH (ref 11.5–15.5)
WBC: 6.4 10*3/uL (ref 4.0–10.5)
nRBC: 0 % (ref 0.0–0.2)

## 2022-10-10 MED ORDER — SODIUM CHLORIDE 0.9 % IV SOLN
Freq: Once | INTRAVENOUS | Status: AC
  Filled 2022-10-10: qty 250

## 2022-10-10 MED ORDER — ACETAMINOPHEN 325 MG PO TABS
650.0000 mg | ORAL_TABLET | Freq: Once | ORAL | Status: AC
  Administered 2022-10-10: 650 mg via ORAL
  Filled 2022-10-10: qty 2

## 2022-10-10 MED ORDER — TRASTUZUMAB-ANNS CHEMO 150 MG IV SOLR
6.0000 mg/kg | Freq: Once | INTRAVENOUS | Status: AC
  Administered 2022-10-10: 300 mg via INTRAVENOUS
  Filled 2022-10-10: qty 14.29

## 2022-10-10 MED ORDER — DIPHENHYDRAMINE HCL 25 MG PO CAPS
50.0000 mg | ORAL_CAPSULE | Freq: Once | ORAL | Status: AC
  Administered 2022-10-10: 50 mg via ORAL
  Filled 2022-10-10: qty 2

## 2022-10-10 MED ORDER — HEPARIN SOD (PORK) LOCK FLUSH 100 UNIT/ML IV SOLN
500.0000 [IU] | Freq: Once | INTRAVENOUS | Status: AC | PRN
  Administered 2022-10-10: 500 [IU]
  Filled 2022-10-10: qty 5

## 2022-10-10 NOTE — Progress Notes (Signed)
Patient states she had some major joint pain on Saturday.

## 2022-10-10 NOTE — Patient Instructions (Signed)
[place] [facility]  Discharge Instructions: Thank you for choosing [facility] to provide your oncology and hematology care.  If you have a lab appointment with the Cancer Center, please go directly to the Cancer Center and check in at the registration area.  Wear comfortable clothing and clothing appropriate for easy access to any Portacath or PICC line.   We strive to give you quality time with your provider. You may need to reschedule your appointment if you arrive late (15 or more minutes).  Arriving late affects you and other patients whose appointments are after yours.  Also, if you miss three or more appointments without notifying the office, you may be dismissed from the clinic at the provider's discretion.      For prescription refill requests, have your pharmacy contact our office and allow 72 hours for refills to be completed.    Today you received the following chemotherapy and/or immunotherapy agents herceptin    To help prevent nausea and vomiting after your treatment, we encourage you to take your nausea medication as directed.  BELOW ARE SYMPTOMS THAT SHOULD BE REPORTED IMMEDIATELY: *FEVER GREATER THAN 100.4 F (38 C) OR HIGHER *CHILLS OR SWEATING *NAUSEA AND VOMITING THAT IS NOT CONTROLLED WITH YOUR NAUSEA MEDICATION *UNUSUAL SHORTNESS OF BREATH *UNUSUAL BRUISING OR BLEEDING *URINARY PROBLEMS (pain or burning when urinating, or frequent urination) *BOWEL PROBLEMS (unusual diarrhea, constipation, pain near the anus) TENDERNESS IN MOUTH AND THROAT WITH OR WITHOUT PRESENCE OF ULCERS (sore throat, sores in mouth, or a toothache) UNUSUAL RASH, SWELLING OR PAIN  UNUSUAL VAGINAL DISCHARGE OR ITCHING   Items with * indicate a potential emergency and should be followed up as soon as possible or go to the Emergency Department if any problems should occur.  Please show the CHEMOTHERAPY ALERT CARD or IMMUNOTHERAPY ALERT CARD at check-in to  the Emergency Department and triage nurse.  Should you have questions after your visit or need to cancel or reschedule your appointment, please contact [place] [facility]  [phone] and follow the prompts.  Office hours are 8:00 a.m. to 4:30 p.m. Monday - Friday. Please note that voicemails left after 4:00 p.m. may not be returned until the following business day.  We are closed weekends and major holidays. You have access to a nurse at all times for urgent questions. Please call the main number to the clinic [phone] and follow the prompts.  For any non-urgent questions, you may also contact your provider using MyChart. We now offer e-Visits for anyone 5 and older to request care online for non-urgent symptoms. For details visit [email or web address].   Also download the MyChart app! Go to the app store, search "MyChart", open the app, select [facility], and log in with your MyChart username and password.

## 2022-10-10 NOTE — Progress Notes (Signed)
[facility] CONSULT NOTE  Patient Care Team: Lorre Munroe, NP as PCP - General (Internal Medicine) Hulen Luster, RN as Oncology Nurse Navigator   CANCER STAGING   Cancer Staging  Breast cancer [facility]) Staging form: Breast, AJCC 8th Edition - Clinical stage from 05/17/2022: Stage IA (cT1b, cN0, cM0, G3, ER-, PR-, HER2+) - Signed by Michaelyn Barter, MD on 05/27/2022 Stage prefix: Initial diagnosis Histologic grading system: 3 grade system  Current treatment Weekly paclitaxel with Herceptin (APT regimen) started on 07/18/2022  ASSESSMENT & PLAN:  Jennifer Garrison 62 y.o. female with pmh of osteoporosis and anxiety was referred to medical oncology for management of left breast stage IA invasive ductal cancer.  # Left breast IDC stage IA, ER/PR negative, HER2 positive # Encounter for cardiac monitoring -Patient felt a palpable mass in her left breast.   -S/p left breast lumpectomy with SLNB by Dr. Tonna Boehringer on 06/09/2022. Pathology showed 1.4 cm IDC, grade 3, DCIS present grade 2-3 with comedonecrosis, 0/3 lymph nodes negative, margins negative, pT1c.  ER/PR negative.  HER2 positive  -Echocardiogram done on 07/14/2022 showed EF of 50 to 55%.  No regional wall motion abnormalities.  Repeat echo is scheduled for July 30 to monitor cardiac function on Herceptin.  -Labs today and acceptable for treatment.  Will proceed with maintenance Herceptin 6 mg/kg every 3 weeks.  She is doing extremely well with the infusions.  She will come in 3 weeks for labs and infusion only.  I will see her back in 6 weeks.  -Patient reports small pea-sized around the nipple area in the left breast.  Diagnostic mammogram and ultrasound showed degrading fibroadenoma.  No concerns for malignancy.  Will repeat mammogram in 1 year due in July 2025.  -Scheduled for CT simulation on July 17.  # Tenderness in nailbeds -Likely secondary to chemotherapy.  Continue supportive care with moisturizer, use Tylenol as  needed. -Okay to use ice packs.  # Abdominal spasm -improved. Dicyclomine 10 mg twice daily as needed helping with abdominal spasm.  #Constipation  - taking colace. If needed, can add miralax   # Anxiety -Worsened with the need of chemotherapy. -Continue with Ativan 0.5 mg every 8 hours as needed.  On Wellbutrin.  # Decreased appetite -Started on [employer] supplements.  Follows with nutrition.  # Chemo induced mucositis -Continue with Magic mouthwash as needed.  # Evaluated by genetics-testing negative  # Access-port.  Placed by Dr. Tonna Boehringer on 06/30/2022.  # Osteoporosis -On alendronate  Orders Placed This Encounter  Procedures   CBC with Differential/Platelet    Standing Status:   Future    Standing Expiration Date:   10/10/2023   Comprehensive metabolic panel    Standing Status:   Future    Standing Expiration Date:   10/10/2023   RTC in 3 weeks for labs and Herceptin RTC in 6 weeks for MD visit, labs, Herceptin  The total time spent in the appointment was 30 minutes encounter with patients including review of chart and various tests results, discussions about plan of care and coordination of care plan   All questions were answered. The patient knows to call the clinic with any problems, questions or concerns. No barriers to learning was detected.  Michaelyn Barter, MD 7/15/202410:13 AM   HISTORY OF PRESENTING ILLNESS:  Jennifer Garrison 62 y.o. female with pmh of osteoporosis on alendronate and anxiety was referred to medical oncology for further management of stage Ia left breast invasive ductal cancer HER2 positive  Patient felt a palpable mass in her left breast mid February 2024.  She sought medical attention from PCP.  Further imaging as below.  INTERVAL HISTORY- Patient was seen today prior to maintenance Herceptin. She has been feeling well overall.  Reports joint aches which is new for her.  Keeping her other symptoms at bay.  She will be going for a trip  soon.   I have reviewed her chart and materials related to her cancer extensively and collaborated history with the patient. Summary of oncologic history is as follows: Oncology History  Breast cancer (HCC)  05/11/2022 Mammogram   Diagnostic mammogram and ultrasound- FINDINGS: Full field and spot compression views of the LEFT breast demonstrate a 1 cm irregular mass within the UPPER INNER LEFT breast, at the site of patient concern. The patient has retropectoral implants.   On physical exam, a firm palpable mass identified at the 11 o'clock position of the LEFT breast 8 cm from the nipple.   Targeted ultrasound is performed, showing a 0.9 x 0.8 x 1 cm irregular hypoechoic mass at the 11 o'clock position of the LEFT breast 8 cm from the nipple.   No abnormal LEFT axillary lymph nodes are noted.   IMPRESSION: 1. Suspicious 1 cm UPPER INNER LEFT breast mass. Tissue sampling is recommended. 2. No abnormal appearing LEFT axillary lymph nodes.   05/17/2022 Cancer Staging   Staging form: Breast, AJCC 8th Edition - Clinical stage from 05/17/2022: Stage IA (cT1b, cN0, cM0, G3, ER-, PR-, HER2+) - Signed by Michaelyn Barter, MD on 05/27/2022 Stage prefix: Initial diagnosis Histologic grading system: 3 grade system   05/17/2022 Initial Biopsy   DIAGNOSIS:  A. BREAST, LEFT, 11:00, 8 CM FROM NIPPLE, ULTRASOUND-GUIDED BIOPSY:   - INVASIVE MAMMARY CARCINOMA, NOS DUCTAL.   Size of invasive carcinoma: 10 mm in this sample  Histologic grade of invasive carcinoma: Grade 3                       Glandular/tubular differentiation score: 3                       Nuclear pleomorphism score: 3                       Mitotic rate score: 2                       Total score: 8  Ductal carcinoma in situ: Not identified  Lymphovascular invasion: Not identified   Estrogen Receptor (ER) Status: NEGATIVE (LESS THAN 1%)   Progesterone Receptor (PgR) Status: NEGATIVE (LESS THAN 1%)   HER2 (by  immunohistochemistry): POSITIVE (Score 3+)       Percentage of cells with uniform intense complete membrane  staining: 80%  Ki-67: Not performed     Genetic Testing   No pathogenic variants identified on the [facility] Multi-Cancer+RNA panel. VUS in MUTYH called c.1484G>A identified. The report date is 06/09/2022.  The Multi-Cancer + RNA Panel offered by [facility] includes sequencing and/or deletion/duplication analysis of the following 70 genes:  AIP*, ALK, APC*, ATM*, AXIN2*, BAP1*, BARD1*, BLM*, BMPR1A*, BRCA1*, BRCA2*, BRIP1*, CDC73*, CDH1*, CDK4, CDKN1B*, CDKN2A, CHEK2*, CTNNA1*, DICER1*, EPCAM, EGFR, FH*, FLCN*, GREM1, HOXB13, KIT, LZTR1, MAX*, MBD4, MEN1*, MET, MITF, MLH1*, MSH2*, MSH3*, MSH6*, MUTYH*, NF1*, NF2*, NTHL1*, PALB2*, PDGFRA, PMS2*, POLD1*, POLE*, POT1*, PRKAR1A*, PTCH1*, PTEN*, RAD51C*, RAD51D*, RB1*, RET, SDHA*, SDHAF2*, SDHB*, SDHC*, SDHD*, SMAD4*, SMARCA4*, SMARCB1*, SMARCE1*, STK11*,  SUFU*, UYQI347*, TP53*, TSC1*, TSC2*, VHL*. RNA analysis is performed for * genes.   06/09/2022 Surgery   S/p left breast lumpectomy with SLNB by Dr. Tonna Boehringer  Pathology showed 1.4 cm IDC, grade 3, DCIS present grade 2-3 with comedonecrosis, 0/3 lymph nodes negative, margins negative, pT1c.  ER/PR negative.  HER2 positive   07/18/2022 -  Chemotherapy   Patient is on Treatment Plan : BREAST Paclitaxel + Trastuzumab q7d / Trastuzumab q21d      Menarche age 23 or 62 Age at first birth 40 Used birth control pills and Depo more than 5 years Menopause was age 37 Hysterectomy yes for prolapsed uterus HRT no History of breast biopsies yes for breast cyst in left lower Family history of lung cancer in mother who was smoker  MEDICAL HISTORY:  Past Medical History:  Diagnosis Date   Allergy    Anxiety    Arthritis    Breast cancer (HCC) 05/27/2022   IBS (irritable bowel syndrome)    Trigger finger    Left Middle Finger 11/2016    SURGICAL HISTORY: Past Surgical History:  Procedure Laterality  Date   ABDOMINAL HYSTERECTOMY  02/2016   Only cervix remains (done for prolapse)   AUGMENTATION MAMMAPLASTY Bilateral 2010   BREAST BIOPSY Left 05/17/2022   [place] BX, ribbon clip, path pending   BREAST BIOPSY Left 05/17/2022   [place] LT BREAST BX W LOC DEV 1ST LESION IMG BX SPEC US GUIDE 05/17/2022 [facility]   BREAST CYST ASPIRATION Left 2010   neg   BREAST ENHANCEMENT SURGERY  1998   CHOLECYSTECTOMY     COLONOSCOPY WITH PROPOFOL N/A 10/10/2019   Procedure: COLONOSCOPY WITH PROPOFOL;  Surgeon: Wyline Mood, MD;  Location: [facility] ENDOSCOPY;  Service: Gastroenterology;  Laterality: N/A;   PARTIAL MASTECTOMY WITH AXILLARY SENTINEL LYMPH NODE BIOPSY Left 06/09/2022   Procedure: PARTIAL MASTECTOMY WITH AXILLARY SENTINEL LYMPH NODE BIOPSY;  Surgeon: Sung Amabile, DO;  Location: [facility] ORS;  Service: General;  Laterality: Left;   PORTACATH PLACEMENT N/A 06/30/2022   Procedure: INSERTION PORT-A-CATH;  Surgeon: Sung Amabile, DO;  Location: [facility] ORS;  Service: General;  Laterality: N/A;   TYMPANOPLASTY Left     SOCIAL HISTORY: Social History   Socioeconomic History   Marital status: Married    Spouse name: Onalee Hua   Number of children: 1   Years of education: Not on file   Highest education level: Not on file  Occupational History   Not on file  Tobacco Use   Smoking status: Former    Current packs/day: 0.00    Types: Cigarettes    Quit date: 09/09/1992    Years since quitting: 30.1   Smokeless tobacco: Current    Types: Chew   Tobacco comments:    Chewing tobacco   Vaping Use   Vaping status: Former  Substance and Sexual Activity   Alcohol use: Not Currently   Drug use: Yes    Types: Marijuana    Comment: daily   Sexual activity: Yes    Birth control/protection: None  Other Topics Concern   Not on file  Social History Narrative   Not on file   Social Determinants of Health   Financial Resource Strain: Not on file  Food Insecurity: No Food Insecurity (05/27/2022)   Hunger Vital  Sign    Worried About Running Out of Food in the Last Year: Never true    Ran Out of Food in the Last Year: Never true  Transportation Needs: No Transportation Needs (05/27/2022)  PRAPARE - Administrator, Civil Service (Medical): No    Lack of Transportation (Non-Medical): No  Physical Activity: Not on file  Stress: Not on file  Social Connections: Not on file  Intimate Partner Violence: Not At Risk (05/27/2022)   Humiliation, Afraid, Rape, and Kick questionnaire    Fear of Current or Ex-Partner: No    Emotionally Abused: No    Physically Abused: No    Sexually Abused: No    FAMILY HISTORY: Family History  Problem Relation Age of Onset   Lung cancer Mother 53   Hypertension Mother    Lupus Father    Leukemia Cousin 6   Breast cancer Neg Hx     ALLERGIES:  is allergic to poison ivy extract.  MEDICATIONS:  Current Outpatient Medications  Medication Sig Dispense Refill   ADVANCED FIBER COMPLEX PO Take 3 tablets by mouth daily. Gummies     alendronate (FOSAMAX) 70 MG tablet Take 70 mg by mouth once a week.     buPROPion (WELLBUTRIN SR) 150 MG 12 hr tablet TAKE 1 TABLET TWICE A DAY 180 tablet 1   busPIRone (BUSPAR) 5 MG tablet Take 1 tablet (5 mg total) by mouth 2 (two) times daily as needed. 180 tablet 1   dicyclomine (BENTYL) 10 MG capsule Take 1 capsule (10 mg total) by mouth 3 (three) times daily as needed for up to 10 days for spasms. 30 capsule 0   HYDROcodone-acetaminophen (NORCO) 5-325 MG tablet Take 1 tablet by mouth every 6 (six) hours as needed for up to 6 doses for moderate pain. 6 tablet 0   ibuprofen (ADVIL) 600 MG tablet Take 1 tablet (600 mg total) by mouth every 6 (six) hours as needed. 30 tablet 0   lidocaine-prilocaine (EMLA) cream Apply to affected area once 30 g 3   loperamide (IMODIUM) 2 MG capsule Take 1 capsule (2 mg total) by mouth every 4 (four) hours as needed for diarrhea or loose stools. Every 4-6 hours as needed 30 capsule 0   LORazepam  (ATIVAN) 0.5 MG tablet Take 1 tablet (0.5 mg total) by mouth every 8 (eight) hours. 30 tablet 0   magic mouthwash (multi-ingredient) oral suspension Take 5 mLs by mouth 4 (four) times daily as needed. 300 mL 0   magic mouthwash w/lidocaine SOLN Take 5 mLs by mouth 4 (four) times daily. 480 mL 3   metroNIDAZOLE (METROCREAM) 0.75 % cream Apply topically as needed. (Patient taking differently: Apply 1 Application topically as needed.)     ondansetron (ZOFRAN) 8 MG tablet Take 1 tablet (8 mg total) by mouth every 8 (eight) hours as needed for nausea or vomiting. 30 tablet 1   OVER THE COUNTER MEDICATION 1.5 tablets in the morning and at bedtime. Calcium Bone Strength Supplement1     OVER THE COUNTER MEDICATION 2 tablets daily. Vitex Berry     OVER THE COUNTER MEDICATION in the morning, at noon, and at bedtime. Milk Thisle Seeds Tea     prochlorperazine (COMPAZINE) 10 MG tablet Take 1 tablet (10 mg total) by mouth every 6 (six) hours as needed for nausea or vomiting. 30 tablet 1   triamcinolone cream (KENALOG) 0.1 % Apply 1 Application topically as needed.     No current facility-administered medications for this visit.   Facility-Administered Medications Ordered in Other Visits  Medication Dose Route Frequency Provider Last Rate Last Admin   acetaminophen (TYLENOL) tablet 650 mg  650 mg Oral Once Michaelyn Barter, MD  diphenhydrAMINE (BENADRYL) capsule 50 mg  50 mg Oral Once Michaelyn Barter, MD       trastuzumab-anns [facility]) 300 mg in sodium chloride 0.9 % 250 mL chemo infusion  6 mg/kg (Treatment Plan Recorded) Intravenous Once Michaelyn Barter, MD        REVIEW OF SYSTEMS:   Pertinent information mentioned in HPI All other systems were reviewed with the patient and are negative.  PHYSICAL EXAMINATION: ECOG PERFORMANCE STATUS: 0 - Asymptomatic  Vitals:   10/10/22 0915  BP: (!) 109/59  Pulse: 64  Resp: 19  Temp: 97.6 F (36.4 C)  SpO2: 98%        Filed Weights   10/10/22  0915  Weight: 118 lb 6.4 oz (53.7 kg)        GENERAL:alert, no distress and comfortable SKIN: skin color, texture, turgor are normal, no rashes or significant lesions EYES: normal, conjunctiva are pink and non-injected, sclera clear OROPHARYNX:no exudate, no erythema and lips, buccal mucosa, and tongue normal  NECK: supple, thyroid normal size, non-tender, without nodularity LYMPH:  no palpable lymphadenopathy in the cervical, axillary or inguinal LUNGS: clear to auscultation and percussion with normal breathing effort HEART: regular rate & rhythm and no murmurs and no lower extremity edema ABDOMEN:abdomen soft, non-tender and normal bowel sounds Musculoskeletal:no cyanosis of digits and no clubbing  PSYCH: alert & oriented x 3 with fluent speech NEURO: no focal motor/sensory deficits  LABORATORY DATA:  I have reviewed the data as listed Lab Results  Component Value Date   WBC 6.4 10/10/2022   HGB 10.8 (L) 10/10/2022   HCT 32.7 (L) 10/10/2022   MCV 93.2 10/10/2022   PLT 294 10/10/2022   Recent Labs    09/26/22 0957 10/03/22 0912 10/10/22 0902  NA 137 137 136  K 3.9 3.6 4.0  CL 107 107 106  CO2 24 24 24   GLUCOSE 113* 133* 103*  BUN 18 17 20   CREATININE 0.79 0.86 0.88  CALCIUM 8.2* 8.5* 8.2*  GFRNONAA >60 >60 >60  PROT 6.3* 6.5 6.6  ALBUMIN 3.7 3.6 3.7  AST 17 19 16   ALT 13 13 13   ALKPHOS 45 40 42  BILITOT 0.3 0.4 0.5    RADIOGRAPHIC STUDIES: I have personally reviewed the radiological images as listed and agreed with the findings in the report. US Breast Limited Uni Left Inc Axilla  Result Date: 10/05/2022 CLINICAL DATA:  62 year old female presenting for evaluation of a palpable lump in the left breast. The patient has history of left breast lumpectomy in April of 2024, and has undergone chemotherapy. She is going to start radiation therapy soon. EXAM: DIGITAL DIAGNOSTIC BILATERAL MAMMOGRAM WITH IMPLANTS, TOMOSYNTHESIS AND CAD; ULTRASOUND LEFT BREAST LIMITED  TECHNIQUE: Bilateral digital diagnostic mammography and breast tomosynthesis was performed. Standard and/or implant displaced views were performed. The images were evaluated with computer-aided detection. ; Targeted ultrasound examination of the left breast was performed. COMPARISON:  Previous exam(s). ACR Breast Density Category b: There are scattered areas of fibroglandular density. FINDINGS: A BB indicating the palpable site of concern has been placed adjacent to the left nipple. Deep to this marker, there is a focally coarse chunky calcification measuring 9 mm. This has been slowly calcifying over several years, and is compatible with a degenerating fibroadenoma. No other suspicious solid masses are identified. No suspicious calcifications, masses or areas of distortion are seen in the bilateral breasts. Expected surgical changes in the lateral posterior left breast at the site of the patient's lumpectomy. The patient has retropectoral  implants. Ultrasound targeted to the palpable site in the retroareolar left breast at 10 o'clock demonstrates a hypoechoic oval mass with indistinct margins and a echogenic internal calcification. The mass measures 1.0 cm, and is consistent with the degenerating fibroadenoma seen mammographically. IMPRESSION: 1. The palpable lump in the retroareolar left breast at 10 o'clock corresponds with a benign degenerating fibroadenoma. 2. Expected surgical changes at the patient's left breast lumpectomy site. No suspicious calcifications, masses or areas of distortion are seen in the bilateral breasts. RECOMMENDATION: Diagnostic mammogram is suggested in 1 year. (Code:DM-B-01Y) I have discussed the findings and recommendations with the patient. If applicable, a reminder letter will be sent to the patient regarding the next appointment. BI-RADS CATEGORY  2: Benign. Electronically Signed   By: Frederico Hamman M.D.   On: 10/05/2022 11:46  MM 3D DIAGNOSTIC MAMMOGRAM BILATERAL BREAST  W/IMPLANT  Result Date: 10/05/2022 CLINICAL DATA:  62 year old female presenting for evaluation of a palpable lump in the left breast. The patient has history of left breast lumpectomy in April of 2024, and has undergone chemotherapy. She is going to start radiation therapy soon. EXAM: DIGITAL DIAGNOSTIC BILATERAL MAMMOGRAM WITH IMPLANTS, TOMOSYNTHESIS AND CAD; ULTRASOUND LEFT BREAST LIMITED TECHNIQUE: Bilateral digital diagnostic mammography and breast tomosynthesis was performed. Standard and/or implant displaced views were performed. The images were evaluated with computer-aided detection. ; Targeted ultrasound examination of the left breast was performed. COMPARISON:  Previous exam(s). ACR Breast Density Category b: There are scattered areas of fibroglandular density. FINDINGS: A BB indicating the palpable site of concern has been placed adjacent to the left nipple. Deep to this marker, there is a focally coarse chunky calcification measuring 9 mm. This has been slowly calcifying over several years, and is compatible with a degenerating fibroadenoma. No other suspicious solid masses are identified. No suspicious calcifications, masses or areas of distortion are seen in the bilateral breasts. Expected surgical changes in the lateral posterior left breast at the site of the patient's lumpectomy. The patient has retropectoral implants. Ultrasound targeted to the palpable site in the retroareolar left breast at 10 o'clock demonstrates a hypoechoic oval mass with indistinct margins and a echogenic internal calcification. The mass measures 1.0 cm, and is consistent with the degenerating fibroadenoma seen mammographically. IMPRESSION: 1. The palpable lump in the retroareolar left breast at 10 o'clock corresponds with a benign degenerating fibroadenoma. 2. Expected surgical changes at the patient's left breast lumpectomy site. No suspicious calcifications, masses or areas of distortion are seen in the bilateral  breasts. RECOMMENDATION: Diagnostic mammogram is suggested in 1 year. (Code:DM-B-01Y) I have discussed the findings and recommendations with the patient. If applicable, a reminder letter will be sent to the patient regarding the next appointment. BI-RADS CATEGORY  2: Benign. Electronically Signed   By: Frederico Hamman M.D.   On: 10/05/2022 11:46

## 2022-10-10 NOTE — Patient Instructions (Signed)

## 2022-10-11 ENCOUNTER — Other Ambulatory Visit: Payer: Self-pay

## 2022-10-12 ENCOUNTER — Ambulatory Visit
Admission: RE | Admit: 2022-10-12 | Discharge: 2022-10-12 | Disposition: A | Discharge status: Home / Self Care | Payer: 59 | Source: Ambulatory Visit | Attending: Radiation Oncology | Admitting: Radiation Oncology

## 2022-10-12 DIAGNOSIS — Z801 Family history of malignant neoplasm of trachea, bronchus and lung: Secondary | ICD-10-CM | POA: Insufficient documentation

## 2022-10-12 DIAGNOSIS — K589 Irritable bowel syndrome without diarrhea: Secondary | ICD-10-CM | POA: Diagnosis not present

## 2022-10-12 DIAGNOSIS — Z79899 Other long term (current) drug therapy: Secondary | ICD-10-CM | POA: Insufficient documentation

## 2022-10-12 DIAGNOSIS — F419 Anxiety disorder, unspecified: Secondary | ICD-10-CM | POA: Insufficient documentation

## 2022-10-12 DIAGNOSIS — M129 Arthropathy, unspecified: Secondary | ICD-10-CM | POA: Insufficient documentation

## 2022-10-12 DIAGNOSIS — Z87891 Personal history of nicotine dependence: Secondary | ICD-10-CM | POA: Diagnosis not present

## 2022-10-12 DIAGNOSIS — Z171 Estrogen receptor negative status [ER-]: Secondary | ICD-10-CM | POA: Diagnosis not present

## 2022-10-12 DIAGNOSIS — Z923 Personal history of irradiation: Secondary | ICD-10-CM | POA: Insufficient documentation

## 2022-10-12 DIAGNOSIS — C50912 Malignant neoplasm of unspecified site of left female breast: Secondary | ICD-10-CM | POA: Insufficient documentation

## 2022-10-13 ENCOUNTER — Encounter: Payer: Self-pay | Admitting: Internal Medicine

## 2022-10-15 ENCOUNTER — Encounter: Payer: Self-pay | Admitting: Internal Medicine

## 2022-10-18 ENCOUNTER — Encounter: Payer: Self-pay | Admitting: *Deleted

## 2022-10-20 ENCOUNTER — Other Ambulatory Visit: Payer: Self-pay | Admitting: *Deleted

## 2022-10-20 DIAGNOSIS — C50912 Malignant neoplasm of unspecified site of left female breast: Secondary | ICD-10-CM

## 2022-10-22 ENCOUNTER — Encounter: Payer: Self-pay | Admitting: Internal Medicine

## 2022-10-23 ENCOUNTER — Ambulatory Visit
Admission: EM | Admit: 2022-10-23 | Discharge: 2022-10-23 | Disposition: A | Discharge status: Home / Self Care | Payer: 59 | Attending: Emergency Medicine | Admitting: Emergency Medicine

## 2022-10-23 DIAGNOSIS — L03012 Cellulitis of left finger: Secondary | ICD-10-CM | POA: Diagnosis not present

## 2022-10-23 DIAGNOSIS — Z8619 Personal history of other infectious and parasitic diseases: Secondary | ICD-10-CM | POA: Diagnosis not present

## 2022-10-23 MED ORDER — FLUCONAZOLE 150 MG PO TABS: 150.0000 mg | ORAL_TABLET | Freq: Every day | ORAL | 0 refills | Status: DC

## 2022-10-23 MED ORDER — DOXYCYCLINE HYCLATE 100 MG PO CAPS: 100.0000 mg | ORAL_CAPSULE | Freq: Two times a day (BID) | ORAL | 0 refills | Status: AC

## 2022-10-23 NOTE — ED Provider Notes (Signed)
Jennifer Garrison    CSN: [id number] Arrival date & time: 10/23/22  1204      History   Chief Complaint Chief Complaint  Patient presents with   Nail Problem    HPI Jennifer Garrison is a 62 y.o. female.  Patient presents with pain, swelling, redness at the edge of her left index fingernail.  She squeezed out some purulent drainage this morning.  She has been treating this with topical antibiotic ointment.  She also noticed her right middle finger nail lifting from the nail bed after she accidentally hit it 1 week ago.  She denies fever, chills, or other symptoms.  Her medical history includes breast cancer.  The history is provided by the patient and medical records.    Past Medical History:  Diagnosis Date   Allergy    Anxiety    Arthritis    Breast cancer (HCC) 05/27/2022   IBS (irritable bowel syndrome)    Trigger finger    Left Middle Finger 11/2016    Patient Active Problem List   Diagnosis Date Noted   Chemotherapy-induced peripheral neuropathy (HCC) 08/01/2022   Mucositis due to antineoplastic therapy 08/01/2022   Encounter for antineoplastic chemotherapy 07/18/2022   Encounter for monoclonal antibody treatment for malignancy 07/18/2022   Genetic testing 06/13/2022   Breast cancer (HCC) 05/27/2022   Age-related osteoporosis without current pathological fracture 12/22/2021   Anxiety and depression 05/01/2017   Irritable bowel syndrome with both constipation and diarrhea 02/21/2017    Past Surgical History:  Procedure Laterality Date   ABDOMINAL HYSTERECTOMY  02/2016   Only cervix remains (done for prolapse)   AUGMENTATION MAMMAPLASTY Bilateral 2010   BREAST BIOPSY Left 05/17/2022   [place] BX, ribbon clip, path pending   BREAST BIOPSY Left 05/17/2022   [place] LT BREAST BX W LOC DEV 1ST LESION IMG BX SPEC US GUIDE 05/17/2022 [facility]   BREAST CYST ASPIRATION Left 2010   neg   BREAST ENHANCEMENT SURGERY  1998   CHOLECYSTECTOMY     COLONOSCOPY WITH PROPOFOL  N/A 10/10/2019   Procedure: COLONOSCOPY WITH PROPOFOL;  Surgeon: Wyline Mood, MD;  Location: [facility] ENDOSCOPY;  Service: Gastroenterology;  Laterality: N/A;   PARTIAL MASTECTOMY WITH AXILLARY SENTINEL LYMPH NODE BIOPSY Left 06/09/2022   Procedure: PARTIAL MASTECTOMY WITH AXILLARY SENTINEL LYMPH NODE BIOPSY;  Surgeon: Sung Amabile, DO;  Location: [facility] ORS;  Service: General;  Laterality: Left;   PORTACATH PLACEMENT N/A 06/30/2022   Procedure: INSERTION PORT-A-CATH;  Surgeon: Sung Amabile, DO;  Location: [facility] ORS;  Service: General;  Laterality: N/A;   TYMPANOPLASTY Left     OB History   No obstetric history on file.      Home Medications    Prior to Admission medications   Medication Sig Start Date End Date Taking? Authorizing Provider  doxycycline (VIBRAMYCIN) 100 MG capsule Take 1 capsule (100 mg total) by mouth 2 (two) times daily for 7 days. 10/23/22 10/30/22 Yes Mickie Bail, NP  fluconazole (DIFLUCAN) 150 MG tablet Take 1 tablet (150 mg total) by mouth daily. Take as directed. 10/23/22  Yes Mickie Bail, NP  ADVANCED FIBER COMPLEX PO Take 3 tablets by mouth daily. Gummies    [provider]  alendronate (FOSAMAX) 70 MG tablet Take 70 mg by mouth once a week. 12/14/21   [provider]  buPROPion [facility] SR) 150 MG 12 hr tablet TAKE 1 TABLET TWICE A DAY 08/18/22   Lorre Munroe, NP  busPIRone (BUSPAR) 5 MG tablet Take 1  tablet (5 mg total) by mouth 2 (two) times daily as needed. 08/09/22   Lorre Munroe, NP  dicyclomine (BENTYL) 10 MG capsule Take 1 capsule (10 mg total) by mouth 3 (three) times daily as needed for up to 10 days for spasms. 08/15/22 10/10/22  Michaelyn Barter, MD  HYDROcodone-acetaminophen (NORCO) 5-325 MG tablet Take 1 tablet by mouth every 6 (six) hours as needed for up to 6 doses for moderate pain. Patient not taking: Reported on 10/23/2022 06/30/22   Sung Amabile, DO  ibuprofen (ADVIL) 600 MG tablet Take 1 tablet (600 mg total) by mouth every 6 (six)  hours as needed. 10/04/21   Mickie Bail, NP  lidocaine-prilocaine (EMLA) cream Apply to affected area once 08/10/22   Michaelyn Barter, MD  loperamide (IMODIUM) 2 MG capsule Take 1 capsule (2 mg total) by mouth every 4 (four) hours as needed for diarrhea or loose stools. Every 4-6 hours as needed 09/27/22 10/27/22  Michaelyn Barter, MD  LORazepam (ATIVAN) 0.5 MG tablet Take 1 tablet (0.5 mg total) by mouth every 8 (eight) hours. 07/18/22   Michaelyn Barter, MD  magic mouthwash (multi-ingredient) oral suspension Take 5 mLs by mouth 4 (four) times daily as needed. 09/20/22   Michaelyn Barter, MD  magic mouthwash w/lidocaine SOLN Take 5 mLs by mouth 4 (four) times daily. 07/25/22   Alinda Dooms, NP  metroNIDAZOLE (METROCREAM) 0.75 % cream Apply topically as needed. Patient taking differently: Apply 1 Application topically as needed. 06/09/22   Tonna Boehringer, Isami, DO  ondansetron (ZOFRAN) 8 MG tablet Take 1 tablet (8 mg total) by mouth every 8 (eight) hours as needed for nausea or vomiting. 06/21/22   Michaelyn Barter, MD  OVER THE COUNTER MEDICATION 1.5 tablets in the morning and at bedtime. Calcium Bone Strength Supplement1    [provider]  OVER THE COUNTER MEDICATION 2 tablets daily. [employer] engineer, Historical, MD  OVER THE COUNTER MEDICATION in the morning, at noon, and at bedtime. Milk Thisle Seeds Tea    [provider]  prochlorperazine (COMPAZINE) 10 MG tablet Take 1 tablet (10 mg total) by mouth every 6 (six) hours as needed for nausea or vomiting. 06/21/22   Michaelyn Barter, MD  triamcinolone cream (KENALOG) 0.1 % Apply 1 Application topically as needed. 06/09/22   Sung Amabile, DO    Family History Family History  Problem Relation Age of Onset   Lung cancer Mother 2   Hypertension Mother    Lupus Father    Leukemia Cousin 6   Breast cancer Neg Hx     Social History Social History   Tobacco Use   Smoking status: Former    Current packs/day: 0.00    Types: Cigarettes     Quit date: 09/09/1992    Years since quitting: 30.1   Smokeless tobacco: Current    Types: Chew   Tobacco comments:    Chewing tobacco   Vaping Use   Vaping status: Former  Substance Use Topics   Alcohol use: Not Currently   Drug use: Yes    Types: Marijuana    Comment: daily     Allergies   Poison ivy extract   Review of Systems Review of Systems  Constitutional:  Negative for chills and fever.  Skin:  Positive for color change and wound.  Neurological:  Negative for weakness and numbness.     Physical Exam Triage Vital Signs ED Triage Vitals  Encounter Vitals Group     BP  Systolic BP Percentile      Diastolic BP Percentile      Pulse      Resp      Temp      Temp src      SpO2      Weight      Height      Head Circumference      Peak Flow      Pain Score      Pain Loc      Pain Education      Exclude from Growth Chart    No data found.  Updated Vital Signs BP 122/78   Pulse 76   Temp 97.8 F (36.6 C)   Resp 18   SpO2 98%   Visual Acuity Right Eye Distance:   Left Eye Distance:   Bilateral Distance:    Right Eye Near:   Left Eye Near:    Bilateral Near:     Physical Exam Constitutional:      General: She is not in acute distress. HENT:     Mouth/Throat:     Mouth: Mucous membranes are moist.  Cardiovascular:     Rate and Rhythm: Normal rate and regular rhythm.  Pulmonary:     Effort: Pulmonary effort is normal. No respiratory distress.  Musculoskeletal:        General: No deformity. Normal range of motion.  Skin:    General: Skin is warm and dry.     Findings: Erythema and lesion present.     Comments: Paronychia of left index finger. No indication of felon.  Neurological:     General: No focal deficit present.     Mental Status: She is alert and oriented to person, place, and time.     Sensory: No sensory deficit.     Motor: No weakness.  Psychiatric:        Mood and Affect: Mood normal.        Behavior: Behavior  normal.      UC Treatments / Results  Labs (all labs ordered are listed, but only abnormal results are displayed) Labs Reviewed - No data to display  EKG   Radiology No results found.  Procedures Incision and Drainage  Date/Time: 10/23/2022 12:54 PM  Performed by: Mickie Bail, NP Authorized by: Mickie Bail, NP   Consent:    Consent obtained:  Verbal   Consent given by:  Patient   Risks discussed:  Bleeding, incomplete drainage, pain and infection Universal protocol:    Procedure explained and questions answered to patient or proxy's satisfaction: yes   Location:    Indications for incision and drainage: paronychia.   Location:  Upper extremity   Upper extremity location:  Finger   Finger location:  L index finger Pre-procedure details:    Skin preparation:  Antiseptic wash Anesthesia:    Anesthesia method:  None Procedure type:    Complexity:  Simple Procedure details:    Incision types:  Stab incision   Drainage:  Bloody   Drainage amount:  Scant   Wound treatment:  Wound left open   Packing materials:  None Post-procedure details:    Procedure completion:  Tolerated well, no immediate complications  (including critical care time)  Medications Ordered in UC Medications - No data to display  Initial Impression / Assessment and Plan / UC Course  I have reviewed the triage vital signs and the nursing notes.  Pertinent labs & imaging results that were available during my  care of the patient were reviewed by me and considered in my medical decision making (see chart for details).    Paronychia of left index finger, history of vaginal candidiasis with antibiotic use.  Patient was able to elicit purulent drainage from the paronychia this morning.  I&D performed with return of scant bloody drainage; no purulent drainage.  Treating with doxycycline.  Also prescribed 1 tablet of Diflucan as patient reports history of yeast infection when taking antibiotics.   Instructed her to follow-up with her PCP if her symptoms are not improving.  She agrees to plan of care.  Final Clinical Impressions(s) / UC Diagnoses   Final diagnoses:  Paronychia of left index finger  History of candidiasis of vagina     Discharge Instructions      Take the doxycycline and Diflucan as directed.  Follow up with your primary care provider if your symptoms are not improving.        ED Prescriptions     Medication Sig Dispense Auth. Provider   fluconazole (DIFLUCAN) 150 MG tablet Take 1 tablet (150 mg total) by mouth daily. Take as directed. 1 tablet Mickie Bail, NP   doxycycline (VIBRAMYCIN) 100 MG capsule Take 1 capsule (100 mg total) by mouth 2 (two) times daily for 7 days. 14 capsule Mickie Bail, NP      PDMP not reviewed this encounter.   Mickie Bail, NP 10/23/22 [id number]

## 2022-10-23 NOTE — ED Triage Notes (Signed)
Patient to Urgent Care with complaints of left sided, index finger nail pain, redness and swelling. Right middle finger nail lifting off. Symptoms started on Thursday. Patient was able to squeeze the area and had some purulent drainage.   Finished chemo three weeks ago.

## 2022-10-23 NOTE — Discharge Instructions (Addendum)
Take the doxycycline and Diflucan as directed.  Follow up with your primary care provider if your symptoms are not improving.

## 2022-10-24 ENCOUNTER — Encounter: Payer: Self-pay | Admitting: *Deleted

## 2022-10-24 ENCOUNTER — Ambulatory Visit: Admission: RE | Admit: 2022-10-24 | Payer: 59 | Source: Ambulatory Visit

## 2022-10-24 DIAGNOSIS — C50912 Malignant neoplasm of unspecified site of left female breast: Secondary | ICD-10-CM | POA: Diagnosis not present

## 2022-10-25 ENCOUNTER — Ambulatory Visit
Admission: RE | Admit: 2022-10-25 | Discharge: 2022-10-25 | Disposition: A | Discharge status: Home / Self Care | Payer: 59 | Source: Ambulatory Visit | Attending: Internal Medicine | Admitting: Internal Medicine

## 2022-10-25 ENCOUNTER — Ambulatory Visit
Admission: RE | Admit: 2022-10-25 | Discharge: 2022-10-25 | Disposition: A | Discharge status: Home / Self Care | Payer: 59 | Source: Ambulatory Visit | Attending: Radiation Oncology | Admitting: Radiation Oncology

## 2022-10-25 ENCOUNTER — Other Ambulatory Visit: Payer: Self-pay

## 2022-10-25 DIAGNOSIS — Z9221 Personal history of antineoplastic chemotherapy: Secondary | ICD-10-CM | POA: Insufficient documentation

## 2022-10-25 DIAGNOSIS — C50912 Malignant neoplasm of unspecified site of left female breast: Secondary | ICD-10-CM | POA: Insufficient documentation

## 2022-10-25 DIAGNOSIS — Z0189 Encounter for other specified special examinations: Secondary | ICD-10-CM

## 2022-10-25 DIAGNOSIS — Z9882 Breast implant status: Secondary | ICD-10-CM | POA: Diagnosis not present

## 2022-10-25 DIAGNOSIS — Z171 Estrogen receptor negative status [ER-]: Secondary | ICD-10-CM | POA: Diagnosis present

## 2022-10-25 LAB — ECHOCARDIOGRAM COMPLETE
AR max vel: 2.81 cm2
AV Area VTI: 3.08 cm2
AV Area mean vel: 2.57 cm2
AV Mean grad: 2 mmHg
AV Peak grad: 5.2 mmHg
Ao pk vel: 1.14 m/s
Area-P 1/2: 2.99 cm2
Calc EF: 64.7 %
MV VTI: 2.34 cm2
S' Lateral: 3.6 cm
Single Plane A2C EF: 64.9 %
Single Plane A4C EF: 63 %

## 2022-10-25 LAB — RAD ONC ARIA SESSION SUMMARY
Course Elapsed Days: 0
Plan Fractions Treated to Date: 1
Plan Prescribed Dose Per Fraction: 2.66 Gy
Plan Total Fractions Prescribed: 16
Plan Total Prescribed Dose: 42.56 Gy
Reference Point Dosage Given to Date: 2.66 Gy
Reference Point Session Dosage Given: 2.66 Gy
Session Number: 1

## 2022-10-25 NOTE — Progress Notes (Signed)
*  PRELIMINARY RESULTS* Echocardiogram 2D Echocardiogram has been performed.  Carolyne Fiscal 10/25/2022, 10:14 AM

## 2022-10-26 ENCOUNTER — Ambulatory Visit
Admission: RE | Admit: 2022-10-26 | Discharge: 2022-10-26 | Disposition: A | Discharge status: Home / Self Care | Payer: 59 | Source: Ambulatory Visit | Attending: Radiation Oncology | Admitting: Radiation Oncology

## 2022-10-26 ENCOUNTER — Encounter: Payer: Self-pay | Admitting: Internal Medicine

## 2022-10-26 ENCOUNTER — Other Ambulatory Visit: Payer: Self-pay

## 2022-10-26 DIAGNOSIS — C50912 Malignant neoplasm of unspecified site of left female breast: Secondary | ICD-10-CM | POA: Diagnosis not present

## 2022-10-26 LAB — RAD ONC ARIA SESSION SUMMARY
Course Elapsed Days: 1
Plan Fractions Treated to Date: 2
Plan Prescribed Dose Per Fraction: 2.66 Gy
Plan Total Fractions Prescribed: 16
Plan Total Prescribed Dose: 42.56 Gy
Reference Point Dosage Given to Date: 5.32 Gy
Reference Point Session Dosage Given: 2.66 Gy
Session Number: 2

## 2022-10-27 ENCOUNTER — Inpatient Hospital Stay: Payer: 59 | Attending: Internal Medicine | Admitting: Internal Medicine

## 2022-10-27 ENCOUNTER — Ambulatory Visit
Admission: RE | Admit: 2022-10-27 | Discharge: 2022-10-27 | Disposition: A | Discharge status: Home / Self Care | Payer: 59 | Source: Ambulatory Visit | Attending: Radiation Oncology | Admitting: Radiation Oncology

## 2022-10-27 ENCOUNTER — Other Ambulatory Visit: Payer: Self-pay

## 2022-10-27 VITALS — BP 99/50 | HR 67 | Temp 98.4°F | Wt 117.8 lb

## 2022-10-27 DIAGNOSIS — K589 Irritable bowel syndrome without diarrhea: Secondary | ICD-10-CM | POA: Insufficient documentation

## 2022-10-27 DIAGNOSIS — L03012 Cellulitis of left finger: Secondary | ICD-10-CM

## 2022-10-27 DIAGNOSIS — Z510 Encounter for antineoplastic radiation therapy: Secondary | ICD-10-CM | POA: Diagnosis not present

## 2022-10-27 DIAGNOSIS — Z87891 Personal history of nicotine dependence: Secondary | ICD-10-CM | POA: Insufficient documentation

## 2022-10-27 DIAGNOSIS — Z5111 Encounter for antineoplastic chemotherapy: Secondary | ICD-10-CM | POA: Diagnosis present

## 2022-10-27 DIAGNOSIS — Z5112 Encounter for antineoplastic immunotherapy: Secondary | ICD-10-CM

## 2022-10-27 DIAGNOSIS — F419 Anxiety disorder, unspecified: Secondary | ICD-10-CM | POA: Insufficient documentation

## 2022-10-27 DIAGNOSIS — Z171 Estrogen receptor negative status [ER-]: Secondary | ICD-10-CM | POA: Insufficient documentation

## 2022-10-27 DIAGNOSIS — D0512 Intraductal carcinoma in situ of left breast: Secondary | ICD-10-CM | POA: Diagnosis present

## 2022-10-27 DIAGNOSIS — M129 Arthropathy, unspecified: Secondary | ICD-10-CM | POA: Insufficient documentation

## 2022-10-27 DIAGNOSIS — C50912 Malignant neoplasm of unspecified site of left female breast: Secondary | ICD-10-CM | POA: Insufficient documentation

## 2022-10-27 DIAGNOSIS — L03019 Cellulitis of unspecified finger: Secondary | ICD-10-CM | POA: Insufficient documentation

## 2022-10-27 DIAGNOSIS — Z79899 Other long term (current) drug therapy: Secondary | ICD-10-CM | POA: Insufficient documentation

## 2022-10-27 DIAGNOSIS — Z923 Personal history of irradiation: Secondary | ICD-10-CM | POA: Insufficient documentation

## 2022-10-27 DIAGNOSIS — Z801 Family history of malignant neoplasm of trachea, bronchus and lung: Secondary | ICD-10-CM | POA: Insufficient documentation

## 2022-10-27 LAB — RAD ONC ARIA SESSION SUMMARY
Course Elapsed Days: 2
Plan Fractions Treated to Date: 3
Plan Prescribed Dose Per Fraction: 2.66 Gy
Plan Total Fractions Prescribed: 16
Plan Total Prescribed Dose: 42.56 Gy
Reference Point Dosage Given to Date: 7.98 Gy
Reference Point Session Dosage Given: 2.66 Gy
Session Number: 3

## 2022-10-27 NOTE — Progress Notes (Signed)
[facility] CONSULT NOTE  Patient Care Team: Lorre Munroe, NP as PCP - General (Internal Medicine) Hulen Luster, RN as Oncology Nurse Navigator Michaelyn Barter, MD as Consulting Physician (Oncology) Carmina Miller, MD as Consulting Physician (Radiation Oncology)   CANCER STAGING   Cancer Staging  Breast cancer [facility]) Staging form: Breast, AJCC 8th Edition - Clinical stage from 05/17/2022: Stage IA (cT1b, cN0, cM0, G3, ER-, PR-, HER2+) - Signed by Michaelyn Barter, MD on 05/27/2022 Stage prefix: Initial diagnosis Histologic grading system: 3 grade system  Current treatment Weekly paclitaxel with Herceptin (APT regimen) started on 07/18/2022  ASSESSMENT & PLAN:  Jennifer Garrison 62 y.o. female with pmh of osteoporosis and anxiety was referred to medical oncology for management of left breast stage IA invasive ductal cancer.  # Left breast IDC stage IA, ER/PR negative, HER2 positive # Encounter for cardiac monitoring -Patient felt a palpable mass in her left breast.   -S/p left breast lumpectomy with SLNB by Dr. Tonna Boehringer on 06/09/2022. Pathology showed 1.4 cm IDC, grade 3, DCIS present grade 2-3 with comedonecrosis, 0/3 lymph nodes negative, margins negative, pT1c.  ER/PR negative.  HER2 positive  -Patient reports small pea-sized around the nipple area in the left breast.  Diagnostic mammogram and ultrasound showed degrading fibroadenoma.  No concerns for malignancy.  Will repeat mammogram in 1 year due in July 2025.  -Completed weekly Taxol x 12 on 10/03/2022. Now on Herceptin every 3 weeks for total of 1 year.  Repeat echocardiogram showed EF of 55 to 60%.  Will continue monitoring on Herceptin.  -Getting adjuvant RT.  Completion date on 11/22/2022.  # Left index paronychia -On doxycycline for 7 days.  Continue with bacitracin topical twice daily as needed. -Nail changes likely related to chemotherapy.  # Anxiety -Worsened with the need of chemotherapy. -Continue with Ativan  0.5 mg every 8 hours as needed.  On Wellbutrin.  # Decreased appetite -Started on [employer] supplements.  Follows with nutrition.  # Chemo induced mucositis -Continue with Magic mouthwash as needed.  # Evaluated by genetics-testing negative  # Access-port.  Placed by Dr. Tonna Boehringer on 06/30/2022.  # Osteoporosis -On alendronate  No orders of the defined types were placed in this encounter.  RTC in 2 weeks for labs and Herceptin. RTC in 4 weeks for MD visit, labs, Herceptin  The total time spent in the appointment was 30 minutes encounter with patients including review of chart and various tests results, discussions about plan of care and coordination of care plan   All questions were answered. The patient knows to call the clinic with any problems, questions or concerns. No barriers to learning was detected.  Michaelyn Barter, MD 8/1/20241:31 PM   HISTORY OF PRESENTING ILLNESS:  Jennifer Garrison 62 y.o. female with pmh of osteoporosis on alendronate and anxiety was referred to medical oncology for further management of stage Ia left breast invasive ductal cancer HER2 positive  Patient felt a palpable mass in her left breast mid February 2024.  She sought medical attention from PCP.  Further imaging as below.  INTERVAL HISTORY- Patient was seen today as an acute visit. Patient went to emergency room last Sunday due to pain, swelling and redness with the left index fingernail.  She squeezed out some purulent discharge. In ED, they attempted drainage and there was scant bloody material.  Was discharged on 7 days of doxycycline.  Pain and swelling has improved.  I examined.  There is some erosion of the  skin around the left index finger nailbed.  She is putting topical bacitracin.  There are nail changes in her other fingers to put do not look infected.  I discussed with the patient that this is likely or secondary to chemotherapy.  Continue with putting bandages, supportive care with  moisturizer and bacitracin.   I have reviewed her chart and materials related to her cancer extensively and collaborated history with the patient. Summary of oncologic history is as follows: Oncology History  Breast cancer (HCC)  05/11/2022 Mammogram   Diagnostic mammogram and ultrasound- FINDINGS: Full field and spot compression views of the LEFT breast demonstrate a 1 cm irregular mass within the UPPER INNER LEFT breast, at the site of patient concern. The patient has retropectoral implants.   On physical exam, a firm palpable mass identified at the 11 o'clock position of the LEFT breast 8 cm from the nipple.   Targeted ultrasound is performed, showing a 0.9 x 0.8 x 1 cm irregular hypoechoic mass at the 11 o'clock position of the LEFT breast 8 cm from the nipple.   No abnormal LEFT axillary lymph nodes are noted.   IMPRESSION: 1. Suspicious 1 cm UPPER INNER LEFT breast mass. Tissue sampling is recommended. 2. No abnormal appearing LEFT axillary lymph nodes.   05/17/2022 Cancer Staging   Staging form: Breast, AJCC 8th Edition - Clinical stage from 05/17/2022: Stage IA (cT1b, cN0, cM0, G3, ER-, PR-, HER2+) - Signed by Michaelyn Barter, MD on 05/27/2022 Stage prefix: Initial diagnosis Histologic grading system: 3 grade system   05/17/2022 Initial Biopsy   DIAGNOSIS:  A. BREAST, LEFT, 11:00, 8 CM FROM NIPPLE, ULTRASOUND-GUIDED BIOPSY:   - INVASIVE MAMMARY CARCINOMA, NOS DUCTAL.   Size of invasive carcinoma: 10 mm in this sample  Histologic grade of invasive carcinoma: Grade 3                       Glandular/tubular differentiation score: 3                       Nuclear pleomorphism score: 3                       Mitotic rate score: 2                       Total score: 8  Ductal carcinoma in situ: Not identified  Lymphovascular invasion: Not identified   Estrogen Receptor (ER) Status: NEGATIVE (LESS THAN 1%)   Progesterone Receptor (PgR) Status: NEGATIVE (LESS THAN 1%)   HER2  (by immunohistochemistry): POSITIVE (Score 3+)       Percentage of cells with uniform intense complete membrane  staining: 80%  Ki-67: Not performed     Genetic Testing   No pathogenic variants identified on the [facility] Multi-Cancer+RNA panel. VUS in MUTYH called c.1484G>A identified. The report date is 06/09/2022.  The Multi-Cancer + RNA Panel offered by [facility] includes sequencing and/or deletion/duplication analysis of the following 70 genes:  AIP*, ALK, APC*, ATM*, AXIN2*, BAP1*, BARD1*, BLM*, BMPR1A*, BRCA1*, BRCA2*, BRIP1*, CDC73*, CDH1*, CDK4, CDKN1B*, CDKN2A, CHEK2*, CTNNA1*, DICER1*, EPCAM, EGFR, FH*, FLCN*, GREM1, HOXB13, KIT, LZTR1, MAX*, MBD4, MEN1*, MET, MITF, MLH1*, MSH2*, MSH3*, MSH6*, MUTYH*, NF1*, NF2*, NTHL1*, PALB2*, PDGFRA, PMS2*, POLD1*, POLE*, POT1*, PRKAR1A*, PTCH1*, PTEN*, RAD51C*, RAD51D*, RB1*, RET, SDHA*, SDHAF2*, SDHB*, SDHC*, SDHD*, SMAD4*, SMARCA4*, SMARCB1*, SMARCE1*, STK11*, SUFU*, TMEM127*, TP53*, TSC1*, TSC2*, VHL*. RNA analysis is performed for * genes.  06/09/2022 Surgery   S/p left breast lumpectomy with SLNB by Dr. Tonna Boehringer  Pathology showed 1.4 cm IDC, grade 3, DCIS present grade 2-3 with comedonecrosis, 0/3 lymph nodes negative, margins negative, pT1c.  ER/PR negative.  HER2 positive   07/18/2022 -  Chemotherapy   Patient is on Treatment Plan : BREAST Paclitaxel + Trastuzumab q7d / Trastuzumab q21d      Menarche age 60 or 89 Age at first birth 1 Used birth control pills and Depo more than 5 years Menopause was age 26 Hysterectomy yes for prolapsed uterus HRT no History of breast biopsies yes for breast cyst in left lower Family history of lung cancer in mother who was smoker  MEDICAL HISTORY:  Past Medical History:  Diagnosis Date   Allergy    Anxiety    Arthritis    Breast cancer (HCC) 05/27/2022   IBS (irritable bowel syndrome)    Trigger finger    Left Middle Finger 11/2016    SURGICAL HISTORY: Past Surgical History:  Procedure  Laterality Date   ABDOMINAL HYSTERECTOMY  02/2016   Only cervix remains (done for prolapse)   AUGMENTATION MAMMAPLASTY Bilateral 2010   BREAST BIOPSY Left 05/17/2022   [place] BX, ribbon clip, path pending   BREAST BIOPSY Left 05/17/2022   [place] LT BREAST BX W LOC DEV 1ST LESION IMG BX SPEC US GUIDE 05/17/2022 [facility]   BREAST CYST ASPIRATION Left 2010   neg   BREAST ENHANCEMENT SURGERY  1998   CHOLECYSTECTOMY     COLONOSCOPY WITH PROPOFOL N/A 10/10/2019   Procedure: COLONOSCOPY WITH PROPOFOL;  Surgeon: Wyline Mood, MD;  Location: [facility] ENDOSCOPY;  Service: Gastroenterology;  Laterality: N/A;   PARTIAL MASTECTOMY WITH AXILLARY SENTINEL LYMPH NODE BIOPSY Left 06/09/2022   Procedure: PARTIAL MASTECTOMY WITH AXILLARY SENTINEL LYMPH NODE BIOPSY;  Surgeon: Sung Amabile, DO;  Location: [facility] ORS;  Service: General;  Laterality: Left;   PORTACATH PLACEMENT N/A 06/30/2022   Procedure: INSERTION PORT-A-CATH;  Surgeon: Sung Amabile, DO;  Location: [facility] ORS;  Service: General;  Laterality: N/A;   TYMPANOPLASTY Left     SOCIAL HISTORY: Social History   Socioeconomic History   Marital status: Married    Spouse name: Onalee Hua   Number of children: 1   Years of education: Not on file   Highest education level: Not on file  Occupational History   Not on file  Tobacco Use   Smoking status: Former    Current packs/day: 0.00    Types: Cigarettes    Quit date: 09/09/1992    Years since quitting: 30.1   Smokeless tobacco: Current    Types: Chew   Tobacco comments:    Chewing tobacco   Vaping Use   Vaping status: Former  Substance and Sexual Activity   Alcohol use: Not Currently   Drug use: Yes    Types: Marijuana    Comment: daily   Sexual activity: Yes    Birth control/protection: None  Other Topics Concern   Not on file  Social History Narrative   Not on file   Social Determinants of Health   Financial Resource Strain: Not on file  Food Insecurity: No Food Insecurity (05/27/2022)    Hunger Vital Sign    Worried About Running Out of Food in the Last Year: Never true    Ran Out of Food in the Last Year: Never true  Transportation Needs: No Transportation Needs (05/27/2022)   PRAPARE - Administrator, Civil Service (Medical): No  Lack of Transportation (Non-Medical): No  Physical Activity: Not on file  Stress: Not on file  Social Connections: Not on file  Intimate Partner Violence: Not At Risk (05/27/2022)   Humiliation, Afraid, Rape, and Kick questionnaire    Fear of Current or Ex-Partner: No    Emotionally Abused: No    Physically Abused: No    Sexually Abused: No    FAMILY HISTORY: Family History  Problem Relation Age of Onset   Lung cancer Mother 88   Hypertension Mother    Lupus Father    Leukemia Cousin 6   Breast cancer Neg Hx     ALLERGIES:  is allergic to poison ivy extract.  MEDICATIONS:  Current Outpatient Medications  Medication Sig Dispense Refill   ADVANCED FIBER COMPLEX PO Take 3 tablets by mouth daily. Gummies     alendronate (FOSAMAX) 70 MG tablet Take 70 mg by mouth once a week.     buPROPion (WELLBUTRIN SR) 150 MG 12 hr tablet TAKE 1 TABLET TWICE A DAY 180 tablet 1   busPIRone (BUSPAR) 5 MG tablet Take 1 tablet (5 mg total) by mouth 2 (two) times daily as needed. 180 tablet 1   doxycycline (VIBRAMYCIN) 100 MG capsule Take 1 capsule (100 mg total) by mouth 2 (two) times daily for 7 days. 14 capsule 0   fluconazole (DIFLUCAN) 150 MG tablet Take 1 tablet (150 mg total) by mouth daily. Take as directed. 1 tablet 0   ibuprofen (ADVIL) 600 MG tablet Take 1 tablet (600 mg total) by mouth every 6 (six) hours as needed. 30 tablet 0   lidocaine-prilocaine (EMLA) cream Apply to affected area once 30 g 3   loperamide (IMODIUM) 2 MG capsule Take 1 capsule (2 mg total) by mouth every 4 (four) hours as needed for diarrhea or loose stools. Every 4-6 hours as needed 30 capsule 0   LORazepam (ATIVAN) 0.5 MG tablet Take 1 tablet (0.5 mg total) by  mouth every 8 (eight) hours. 30 tablet 0   magic mouthwash (multi-ingredient) oral suspension Take 5 mLs by mouth 4 (four) times daily as needed. 300 mL 0   magic mouthwash w/lidocaine SOLN Take 5 mLs by mouth 4 (four) times daily. 480 mL 3   metroNIDAZOLE (METROCREAM) 0.75 % cream Apply topically as needed.     ondansetron (ZOFRAN) 8 MG tablet Take 1 tablet (8 mg total) by mouth every 8 (eight) hours as needed for nausea or vomiting. 30 tablet 1   OVER THE COUNTER MEDICATION 1.5 tablets in the morning and at bedtime. Calcium Bone Strength Supplement1     OVER THE COUNTER MEDICATION 2 tablets daily. Vitex Berry     OVER THE COUNTER MEDICATION in the morning, at noon, and at bedtime. Milk Thisle Seeds Tea     prochlorperazine (COMPAZINE) 10 MG tablet Take 1 tablet (10 mg total) by mouth every 6 (six) hours as needed for nausea or vomiting. 30 tablet 1   triamcinolone cream (KENALOG) 0.1 % Apply 1 Application topically as needed.     dicyclomine (BENTYL) 10 MG capsule Take 1 capsule (10 mg total) by mouth 3 (three) times daily as needed for up to 10 days for spasms. 30 capsule 0   HYDROcodone-acetaminophen (NORCO) 5-325 MG tablet Take 1 tablet by mouth every 6 (six) hours as needed for up to 6 doses for moderate pain. (Patient not taking: Reported on 10/27/2022) 6 tablet 0   No current facility-administered medications for this visit.    REVIEW OF  SYSTEMS:   Pertinent information mentioned in HPI All other systems were reviewed with the patient and are negative.  PHYSICAL EXAMINATION: ECOG PERFORMANCE STATUS: 0 - Asymptomatic  Vitals:   10/27/22 1052  BP: (!) 99/50  Pulse: 67  Temp: 98.4 F (36.9 C)  SpO2: 97%        Filed Weights   10/27/22 1052  Weight: 117 lb 12.8 oz (53.4 kg)        GENERAL:alert, no distress and comfortable SKIN: skin color, texture, turgor are normal, no rashes or significant lesions EYES: normal, conjunctiva are pink and non-injected, sclera  clear OROPHARYNX:no exudate, no erythema and lips, buccal mucosa, and tongue normal  NECK: supple, thyroid normal size, non-tender, without nodularity LYMPH:  no palpable lymphadenopathy in the cervical, axillary or inguinal LUNGS: clear to auscultation and percussion with normal breathing effort HEART: regular rate & rhythm and no murmurs and no lower extremity edema ABDOMEN:abdomen soft, non-tender and normal bowel sounds Musculoskeletal:no cyanosis of digits and no clubbing  PSYCH: alert & oriented x 3 with fluent speech NEURO: no focal motor/sensory deficits  LABORATORY DATA:  I have reviewed the data as listed Lab Results  Component Value Date   WBC 6.4 10/10/2022   HGB 10.8 (L) 10/10/2022   HCT 32.7 (L) 10/10/2022   MCV 93.2 10/10/2022   PLT 294 10/10/2022   Recent Labs    09/26/22 0957 10/03/22 0912 10/10/22 0902  NA 137 137 136  K 3.9 3.6 4.0  CL 107 107 106  CO2 24 24 24   GLUCOSE 113* 133* 103*  BUN 18 17 20   CREATININE 0.79 0.86 0.88  CALCIUM 8.2* 8.5* 8.2*  GFRNONAA >60 >60 >60  PROT 6.3* 6.5 6.6  ALBUMIN 3.7 3.6 3.7  AST 17 19 16   ALT 13 13 13   ALKPHOS 45 40 42  BILITOT 0.3 0.4 0.5    RADIOGRAPHIC STUDIES: I have personally reviewed the radiological images as listed and agreed with the findings in the report. ECHOCARDIOGRAM COMPLETE  Result Date: 10/25/2022    ECHOCARDIOGRAM REPORT   Patient Name:   Jennifer Garrison Date of Exam: 10/25/2022 Medical Rec #:  [id number]    Height:       64.0 in Accession #:    [id number]   Weight:       118.4 lb Date of Birth:  02-27-1961     BSA:          1.566 m Patient Age:    62 years     BP:           122/78 mmHg Patient Gender: F            HR:           64 bpm. Exam Location:  [facility] Procedure: 2D Echo, Cardiac Doppler, Color Doppler, 3D Echo and Strain Analysis Indications:     CHEMO  History:         Patient has prior history of Echocardiogram examinations, most                  recent 07/14/2022. CHEMO.  Sonographer:      Mikki Harbor Referring Phys:  [id number] Michaelyn Barter Diagnosing Phys: Lorine Bears MD  Sonographer Comments: Image acquisition challenging due to breast implants. Global longitudinal strain was attempted. IMPRESSIONS  1. Left ventricular ejection fraction, by estimation, is 55 to 60%. The left ventricle has normal function. The left ventricle has no regional wall motion abnormalities. Left ventricular diastolic parameters were normal.  The average left ventricular global longitudinal strain is -17.8 %. The global longitudinal strain is normal.  2. Right ventricular systolic function is normal. The right ventricular size is normal. Tricuspid regurgitation signal is inadequate for assessing PA pressure.  3. The mitral valve is normal in structure. Trivial mitral valve regurgitation. No evidence of mitral stenosis.  4. The aortic valve is normal in structure. Aortic valve regurgitation is trivial. Aortic valve sclerosis is present, with no evidence of aortic valve stenosis.  5. The inferior vena cava is normal in size with greater than 50% respiratory variability, suggesting right atrial pressure of 3 mmHg. FINDINGS  Left Ventricle: Left ventricular ejection fraction, by estimation, is 55 to 60%. The left ventricle has normal function. The left ventricle has no regional wall motion abnormalities. The average left ventricular global longitudinal strain is -17.8 %. The global longitudinal strain is normal. The left ventricular internal cavity size was normal in size. There is no left ventricular hypertrophy. Left ventricular diastolic parameters were normal. Right Ventricle: The right ventricular size is normal. No increase in right ventricular wall thickness. Right ventricular systolic function is normal. Tricuspid regurgitation signal is inadequate for assessing PA pressure. Left Atrium: Left atrial size was normal in size. Right Atrium: Right atrial size was normal in size. Pericardium: There is no evidence of  pericardial effusion. Mitral Valve: The mitral valve is normal in structure. Trivial mitral valve regurgitation. No evidence of mitral valve stenosis. MV peak gradient, 3.3 mmHg. The mean mitral valve gradient is 1.0 mmHg. Tricuspid Valve: The tricuspid valve is normal in structure. Tricuspid valve regurgitation is mild . No evidence of tricuspid stenosis. Aortic Valve: The aortic valve is normal in structure. Aortic valve regurgitation is trivial. Aortic valve sclerosis is present, with no evidence of aortic valve stenosis. Aortic valve mean gradient measures 2.0 mmHg. Aortic valve peak gradient measures 5.2 mmHg. Aortic valve area, by VTI measures 3.08 cm. Pulmonic Valve: The pulmonic valve was normal in structure. Pulmonic valve regurgitation is trivial. No evidence of pulmonic stenosis. Aorta: The aortic root is normal in size and structure. Venous: The inferior vena cava is normal in size with greater than 50% respiratory variability, suggesting right atrial pressure of 3 mmHg. IAS/Shunts: No atrial level shunt detected by color flow Doppler.  LEFT VENTRICLE PLAX 2D LVIDd:         5.00 cm     Diastology LVIDs:         3.60 cm     LV e' medial:    9.57 cm/s LV PW:         0.70 cm     LV E/e' medial:  9.3 LV IVS:        0.90 cm     LV e' lateral:   18.70 cm/s LVOT diam:     1.90 cm     LV E/e' lateral: 4.8 LV SV:         71 LV SV Index:   45          2D Longitudinal Strain LVOT Area:     2.84 cm    2D Strain GLS Avg:     -17.8 %  LV Volumes (MOD) LV vol d, MOD A2C: 64.6 ml LV vol d, MOD A4C: 74.3 ml LV vol s, MOD A2C: 22.7 ml LV vol s, MOD A4C: 27.5 ml LV SV MOD A2C:     41.9 ml LV SV MOD A4C:     74.3 ml LV SV MOD BP:  45.7 ml RIGHT VENTRICLE RV Basal diam:  3.35 cm RV Mid diam:    2.70 cm RV S prime:     11.60 cm/s TAPSE (M-mode): 2.7 cm LEFT ATRIUM             Index        RIGHT ATRIUM           Index LA diam:        3.50 cm 2.24 cm/m   RA Area:     17.70 cm LA Vol (A2C):   53.1 ml 33.92 ml/m  RA  Volume:   46.70 ml  29.83 ml/m LA Vol (A4C):   35.3 ml 22.55 ml/m LA Biplane Vol: 46.5 ml 29.70 ml/m  AORTIC VALVE                    PULMONIC VALVE AV Area (Vmax):    2.81 cm     PV Vmax:       0.72 m/s AV Area (Vmean):   2.57 cm     PV Peak grad:  2.1 mmHg AV Area (VTI):     3.08 cm AV Vmax:           114.00 cm/s AV Vmean:          70.200 cm/s AV VTI:            0.230 m AV Peak Grad:      5.2 mmHg AV Mean Grad:      2.0 mmHg LVOT Vmax:         113.00 cm/s LVOT Vmean:        63.600 cm/s LVOT VTI:          0.250 m LVOT/AV VTI ratio: 1.09  AORTA Ao Root diam: 3.60 cm Ao Asc diam:  3.60 cm MITRAL VALVE MV Area (PHT): 2.99 cm    SHUNTS MV Area VTI:   2.34 cm    Systemic VTI:  0.25 m MV Peak grad:  3.3 mmHg    Systemic Diam: 1.90 cm MV Mean grad:  1.0 mmHg MV Vmax:       0.90 m/s MV Vmean:      49.1 cm/s MV Decel Time: 254 msec MV E velocity: 89.10 cm/s MV A velocity: 65.00 cm/s MV E/A ratio:  1.37 Lorine Bears MD Electronically signed by Lorine Bears MD Signature Date/Time: 10/25/2022/11:23:07 AM    Final    US Breast Limited Uni Left Inc Axilla  Result Date: 10/05/2022 CLINICAL DATA:  62 year old female presenting for evaluation of a palpable lump in the left breast. The patient has history of left breast lumpectomy in April of 2024, and has undergone chemotherapy. She is going to start radiation therapy soon. EXAM: DIGITAL DIAGNOSTIC BILATERAL MAMMOGRAM WITH IMPLANTS, TOMOSYNTHESIS AND CAD; ULTRASOUND LEFT BREAST LIMITED TECHNIQUE: Bilateral digital diagnostic mammography and breast tomosynthesis was performed. Standard and/or implant displaced views were performed. The images were evaluated with computer-aided detection. ; Targeted ultrasound examination of the left breast was performed. COMPARISON:  Previous exam(s). ACR Breast Density Category b: There are scattered areas of fibroglandular density. FINDINGS: A BB indicating the palpable site of concern has been placed adjacent to the left nipple.  Deep to this marker, there is a focally coarse chunky calcification measuring 9 mm. This has been slowly calcifying over several years, and is compatible with a degenerating fibroadenoma. No other suspicious solid masses are identified. No suspicious calcifications, masses or areas of distortion are seen in the bilateral breasts. Expected  surgical changes in the lateral posterior left breast at the site of the patient's lumpectomy. The patient has retropectoral implants. Ultrasound targeted to the palpable site in the retroareolar left breast at 10 o'clock demonstrates a hypoechoic oval mass with indistinct margins and a echogenic internal calcification. The mass measures 1.0 cm, and is consistent with the degenerating fibroadenoma seen mammographically. IMPRESSION: 1. The palpable lump in the retroareolar left breast at 10 o'clock corresponds with a benign degenerating fibroadenoma. 2. Expected surgical changes at the patient's left breast lumpectomy site. No suspicious calcifications, masses or areas of distortion are seen in the bilateral breasts. RECOMMENDATION: Diagnostic mammogram is suggested in 1 year. (Code:DM-B-01Y) I have discussed the findings and recommendations with the patient. If applicable, a reminder letter will be sent to the patient regarding the next appointment. BI-RADS CATEGORY  2: Benign. Electronically Signed   By: Frederico Hamman M.D.   On: 10/05/2022 11:46  MM 3D DIAGNOSTIC MAMMOGRAM BILATERAL BREAST W/IMPLANT  Result Date: 10/05/2022 CLINICAL DATA:  62 year old female presenting for evaluation of a palpable lump in the left breast. The patient has history of left breast lumpectomy in April of 2024, and has undergone chemotherapy. She is going to start radiation therapy soon. EXAM: DIGITAL DIAGNOSTIC BILATERAL MAMMOGRAM WITH IMPLANTS, TOMOSYNTHESIS AND CAD; ULTRASOUND LEFT BREAST LIMITED TECHNIQUE: Bilateral digital diagnostic mammography and breast tomosynthesis was performed.  Standard and/or implant displaced views were performed. The images were evaluated with computer-aided detection. ; Targeted ultrasound examination of the left breast was performed. COMPARISON:  Previous exam(s). ACR Breast Density Category b: There are scattered areas of fibroglandular density. FINDINGS: A BB indicating the palpable site of concern has been placed adjacent to the left nipple. Deep to this marker, there is a focally coarse chunky calcification measuring 9 mm. This has been slowly calcifying over several years, and is compatible with a degenerating fibroadenoma. No other suspicious solid masses are identified. No suspicious calcifications, masses or areas of distortion are seen in the bilateral breasts. Expected surgical changes in the lateral posterior left breast at the site of the patient's lumpectomy. The patient has retropectoral implants. Ultrasound targeted to the palpable site in the retroareolar left breast at 10 o'clock demonstrates a hypoechoic oval mass with indistinct margins and a echogenic internal calcification. The mass measures 1.0 cm, and is consistent with the degenerating fibroadenoma seen mammographically. IMPRESSION: 1. The palpable lump in the retroareolar left breast at 10 o'clock corresponds with a benign degenerating fibroadenoma. 2. Expected surgical changes at the patient's left breast lumpectomy site. No suspicious calcifications, masses or areas of distortion are seen in the bilateral breasts. RECOMMENDATION: Diagnostic mammogram is suggested in 1 year. (Code:DM-B-01Y) I have discussed the findings and recommendations with the patient. If applicable, a reminder letter will be sent to the patient regarding the next appointment. BI-RADS CATEGORY  2: Benign. Electronically Signed   By: Frederico Hamman M.D.   On: 10/05/2022 11:46

## 2022-10-27 NOTE — Progress Notes (Signed)
Patient says that she is doing fine other than just being tired. Her finger nails have got worse.

## 2022-10-28 ENCOUNTER — Other Ambulatory Visit: Payer: Self-pay

## 2022-10-28 ENCOUNTER — Ambulatory Visit
Admission: RE | Admit: 2022-10-28 | Discharge: 2022-10-28 | Disposition: A | Discharge status: Home / Self Care | Payer: 59 | Source: Ambulatory Visit | Attending: Radiation Oncology | Admitting: Radiation Oncology

## 2022-10-28 DIAGNOSIS — D0512 Intraductal carcinoma in situ of left breast: Secondary | ICD-10-CM | POA: Diagnosis not present

## 2022-10-28 LAB — RAD ONC ARIA SESSION SUMMARY
Course Elapsed Days: 3
Plan Fractions Treated to Date: 4
Plan Prescribed Dose Per Fraction: 2.66 Gy
Plan Total Fractions Prescribed: 16
Plan Total Prescribed Dose: 42.56 Gy
Reference Point Dosage Given to Date: 10.64 Gy
Reference Point Session Dosage Given: 2.66 Gy
Session Number: 4

## 2022-10-31 ENCOUNTER — Inpatient Hospital Stay: Payer: 59

## 2022-10-31 ENCOUNTER — Ambulatory Visit: Payer: 59

## 2022-10-31 ENCOUNTER — Ambulatory Visit: Payer: 59 | Admitting: Internal Medicine

## 2022-10-31 ENCOUNTER — Other Ambulatory Visit: Payer: Self-pay

## 2022-10-31 ENCOUNTER — Ambulatory Visit
Admission: RE | Admit: 2022-10-31 | Discharge: 2022-10-31 | Disposition: A | Discharge status: Home / Self Care | Payer: 59 | Source: Ambulatory Visit | Attending: Radiation Oncology | Admitting: Radiation Oncology

## 2022-10-31 ENCOUNTER — Other Ambulatory Visit: Payer: 59

## 2022-10-31 VITALS — BP 102/52 | HR 63 | Temp 98.0°F | Resp 16

## 2022-10-31 DIAGNOSIS — C50912 Malignant neoplasm of unspecified site of left female breast: Secondary | ICD-10-CM

## 2022-10-31 DIAGNOSIS — D0512 Intraductal carcinoma in situ of left breast: Secondary | ICD-10-CM | POA: Diagnosis not present

## 2022-10-31 LAB — CBC WITH DIFFERENTIAL/PLATELET
Abs Immature Granulocytes: 0.03 10*3/uL (ref 0.00–0.07)
Basophils Absolute: 0.1 10*3/uL (ref 0.0–0.1)
Basophils Relative: 1 %
Eosinophils Absolute: 0.5 10*3/uL (ref 0.0–0.5)
Eosinophils Relative: 5 %
HCT: 38.5 % (ref 36.0–46.0)
Hemoglobin: 12.8 g/dL (ref 12.0–15.0)
Immature Granulocytes: 0 %
Lymphocytes Relative: 15 %
Lymphs Abs: 1.5 10*3/uL (ref 0.7–4.0)
MCH: 30.3 pg (ref 26.0–34.0)
MCHC: 33.2 g/dL (ref 30.0–36.0)
MCV: 91.2 fL (ref 80.0–100.0)
Monocytes Absolute: 0.6 10*3/uL (ref 0.1–1.0)
Monocytes Relative: 6 %
Neutro Abs: 7.1 10*3/uL (ref 1.7–7.7)
Neutrophils Relative %: 73 %
Platelets: 209 10*3/uL (ref 150–400)
RBC: 4.22 MIL/uL (ref 3.87–5.11)
RDW: 13.7 % (ref 11.5–15.5)
WBC: 9.8 10*3/uL (ref 4.0–10.5)
nRBC: 0 % (ref 0.0–0.2)

## 2022-10-31 LAB — COMPREHENSIVE METABOLIC PANEL
ALT: 13 U/L (ref 0–44)
AST: 17 U/L (ref 15–41)
Albumin: 3.9 g/dL (ref 3.5–5.0)
Alkaline Phosphatase: 52 U/L (ref 38–126)
Anion gap: 7 (ref 5–15)
BUN: 20 mg/dL (ref 8–23)
CO2: 23 mmol/L (ref 22–32)
Calcium: 9 mg/dL (ref 8.9–10.3)
Chloride: 107 mmol/L (ref 98–111)
Creatinine, Ser: 0.78 mg/dL (ref 0.44–1.00)
GFR, Estimated: >60 mL/min (ref >60)
Glucose, Bld: 114 mg/dL — ABNORMAL HIGH (ref 70–99)
Potassium: 3.9 mmol/L (ref 3.5–5.1)
Sodium: 137 mmol/L (ref 135–145)
Total Bilirubin: 0.8 mg/dL (ref 0.3–1.2)
Total Protein: 6.9 g/dL (ref 6.5–8.1)

## 2022-10-31 LAB — RAD ONC ARIA SESSION SUMMARY
Course Elapsed Days: 6
Plan Fractions Treated to Date: 5
Plan Prescribed Dose Per Fraction: 2.66 Gy
Plan Total Fractions Prescribed: 16
Plan Total Prescribed Dose: 42.56 Gy
Reference Point Dosage Given to Date: 13.3 Gy
Reference Point Session Dosage Given: 2.66 Gy
Session Number: 5

## 2022-10-31 MED ORDER — ACETAMINOPHEN 325 MG PO TABS
650.0000 mg | ORAL_TABLET | Freq: Once | ORAL | Status: AC
  Administered 2022-10-31: 650 mg via ORAL
  Filled 2022-10-31: qty 2

## 2022-10-31 MED ORDER — HEPARIN SOD (PORK) LOCK FLUSH 100 UNIT/ML IV SOLN
500.0000 [IU] | Freq: Once | INTRAVENOUS | Status: AC | PRN
  Administered 2022-10-31: 500 [IU]
  Filled 2022-10-31: qty 5

## 2022-10-31 MED ORDER — TRASTUZUMAB-ANNS CHEMO 150 MG IV SOLR
6.0000 mg/kg | Freq: Once | INTRAVENOUS | Status: AC
  Administered 2022-10-31: 300 mg via INTRAVENOUS
  Filled 2022-10-31: qty 14.29

## 2022-10-31 MED ORDER — SODIUM CHLORIDE 0.9 % IV SOLN
Freq: Once | INTRAVENOUS | Status: AC
  Filled 2022-10-31: qty 250

## 2022-10-31 MED ORDER — DIPHENHYDRAMINE HCL 25 MG PO CAPS
50.0000 mg | ORAL_CAPSULE | Freq: Once | ORAL | Status: AC
  Administered 2022-10-31: 50 mg via ORAL
  Filled 2022-10-31: qty 2

## 2022-10-31 NOTE — Patient Instructions (Signed)

## 2022-11-01 ENCOUNTER — Other Ambulatory Visit: Payer: Self-pay

## 2022-11-01 ENCOUNTER — Ambulatory Visit
Admission: RE | Admit: 2022-11-01 | Discharge: 2022-11-01 | Disposition: A | Discharge status: Home / Self Care | Payer: 59 | Source: Ambulatory Visit | Attending: Radiation Oncology | Admitting: Radiation Oncology

## 2022-11-01 DIAGNOSIS — D0512 Intraductal carcinoma in situ of left breast: Secondary | ICD-10-CM | POA: Diagnosis not present

## 2022-11-01 LAB — RAD ONC ARIA SESSION SUMMARY
Course Elapsed Days: 7
Plan Fractions Treated to Date: 6
Plan Prescribed Dose Per Fraction: 2.66 Gy
Plan Total Fractions Prescribed: 16
Plan Total Prescribed Dose: 42.56 Gy
Reference Point Dosage Given to Date: 15.96 Gy
Reference Point Session Dosage Given: 2.66 Gy
Session Number: 6

## 2022-11-02 ENCOUNTER — Ambulatory Visit: Admission: RE | Admit: 2022-11-02 | Payer: 59 | Source: Ambulatory Visit

## 2022-11-02 ENCOUNTER — Other Ambulatory Visit: Payer: Self-pay

## 2022-11-02 DIAGNOSIS — D0512 Intraductal carcinoma in situ of left breast: Secondary | ICD-10-CM | POA: Diagnosis not present

## 2022-11-02 LAB — RAD ONC ARIA SESSION SUMMARY
Course Elapsed Days: 8
Plan Fractions Treated to Date: 7
Plan Prescribed Dose Per Fraction: 2.66 Gy
Plan Total Fractions Prescribed: 16
Plan Total Prescribed Dose: 42.56 Gy
Reference Point Dosage Given to Date: 18.62 Gy
Reference Point Session Dosage Given: 2.66 Gy
Session Number: 7

## 2022-11-03 ENCOUNTER — Ambulatory Visit
Admission: RE | Admit: 2022-11-03 | Discharge: 2022-11-03 | Disposition: A | Discharge status: Home / Self Care | Payer: 59 | Source: Ambulatory Visit | Attending: Radiation Oncology | Admitting: Radiation Oncology

## 2022-11-03 ENCOUNTER — Encounter: Payer: Self-pay | Admitting: Internal Medicine

## 2022-11-03 ENCOUNTER — Other Ambulatory Visit: Payer: Self-pay

## 2022-11-03 DIAGNOSIS — D0512 Intraductal carcinoma in situ of left breast: Secondary | ICD-10-CM | POA: Diagnosis not present

## 2022-11-03 LAB — RAD ONC ARIA SESSION SUMMARY
Course Elapsed Days: 9
Plan Fractions Treated to Date: 8
Plan Prescribed Dose Per Fraction: 2.66 Gy
Plan Total Fractions Prescribed: 16
Plan Total Prescribed Dose: 42.56 Gy
Reference Point Dosage Given to Date: 21.28 Gy
Reference Point Session Dosage Given: 2.66 Gy
Session Number: 8

## 2022-11-04 ENCOUNTER — Other Ambulatory Visit: Payer: Self-pay

## 2022-11-04 ENCOUNTER — Ambulatory Visit: Admission: RE | Admit: 2022-11-04 | Payer: 59 | Source: Ambulatory Visit

## 2022-11-04 DIAGNOSIS — D0512 Intraductal carcinoma in situ of left breast: Secondary | ICD-10-CM | POA: Diagnosis not present

## 2022-11-04 LAB — RAD ONC ARIA SESSION SUMMARY
Course Elapsed Days: 10
Plan Fractions Treated to Date: 9
Plan Prescribed Dose Per Fraction: 2.66 Gy
Plan Total Fractions Prescribed: 16
Plan Total Prescribed Dose: 42.56 Gy
Reference Point Dosage Given to Date: 23.94 Gy
Reference Point Session Dosage Given: 2.66 Gy
Session Number: 9

## 2022-11-05 ENCOUNTER — Encounter: Payer: Self-pay | Admitting: Internal Medicine

## 2022-11-07 ENCOUNTER — Other Ambulatory Visit: Payer: Self-pay

## 2022-11-07 ENCOUNTER — Ambulatory Visit
Admission: RE | Admit: 2022-11-07 | Discharge: 2022-11-07 | Disposition: A | Discharge status: Home / Self Care | Payer: 59 | Source: Ambulatory Visit | Attending: Radiation Oncology | Admitting: Radiation Oncology

## 2022-11-07 DIAGNOSIS — D0512 Intraductal carcinoma in situ of left breast: Secondary | ICD-10-CM | POA: Diagnosis not present

## 2022-11-07 LAB — RAD ONC ARIA SESSION SUMMARY
Course Elapsed Days: 13
Plan Fractions Treated to Date: 10
Plan Prescribed Dose Per Fraction: 2.66 Gy
Plan Total Fractions Prescribed: 16
Plan Total Prescribed Dose: 42.56 Gy
Reference Point Dosage Given to Date: 26.6 Gy
Reference Point Session Dosage Given: 2.66 Gy
Session Number: 10

## 2022-11-08 ENCOUNTER — Ambulatory Visit
Admission: RE | Admit: 2022-11-08 | Discharge: 2022-11-08 | Disposition: A | Discharge status: Home / Self Care | Payer: 59 | Source: Ambulatory Visit | Attending: Radiation Oncology | Admitting: Radiation Oncology

## 2022-11-08 ENCOUNTER — Other Ambulatory Visit: Payer: Self-pay

## 2022-11-08 ENCOUNTER — Inpatient Hospital Stay (HOSPITAL_BASED_OUTPATIENT_CLINIC_OR_DEPARTMENT_OTHER): Payer: 59 | Admitting: Nurse Practitioner

## 2022-11-08 ENCOUNTER — Encounter: Payer: Self-pay | Admitting: Nurse Practitioner

## 2022-11-08 VITALS — BP 129/79 | HR 72 | Temp 97.2°F | Resp 72 | Wt 114.0 lb

## 2022-11-08 DIAGNOSIS — R239 Unspecified skin changes: Secondary | ICD-10-CM | POA: Diagnosis not present

## 2022-11-08 DIAGNOSIS — L03012 Cellulitis of left finger: Secondary | ICD-10-CM | POA: Diagnosis not present

## 2022-11-08 DIAGNOSIS — T451X5A Adverse effect of antineoplastic and immunosuppressive drugs, initial encounter: Secondary | ICD-10-CM

## 2022-11-08 DIAGNOSIS — D0512 Intraductal carcinoma in situ of left breast: Secondary | ICD-10-CM | POA: Diagnosis not present

## 2022-11-08 DIAGNOSIS — L601 Onycholysis: Secondary | ICD-10-CM | POA: Diagnosis not present

## 2022-11-08 LAB — RAD ONC ARIA SESSION SUMMARY
Course Elapsed Days: 14
Plan Fractions Treated to Date: 11
Plan Prescribed Dose Per Fraction: 2.66 Gy
Plan Total Fractions Prescribed: 16
Plan Total Prescribed Dose: 42.56 Gy
Reference Point Dosage Given to Date: 29.26 Gy
Reference Point Session Dosage Given: 2.66 Gy
Session Number: 11

## 2022-11-08 MED ORDER — TRIAMCINOLONE ACETONIDE 0.1 % EX CREA
1.0000 | TOPICAL_CREAM | Freq: Two times a day (BID) | CUTANEOUS | 0 refills | Status: AC
Start: 2022-11-08 — End: ?

## 2022-11-08 MED ORDER — DOXYCYCLINE HYCLATE 100 MG PO TABS
100.0000 mg | ORAL_TABLET | Freq: Two times a day (BID) | ORAL | 0 refills | Status: AC
Start: 2022-11-08 — End: 2022-11-22

## 2022-11-08 MED ORDER — MUPIROCIN CALCIUM 2 % EX CREA
1.0000 | TOPICAL_CREAM | Freq: Two times a day (BID) | CUTANEOUS | 0 refills | Status: DC
Start: 2022-11-08 — End: 2023-10-17

## 2022-11-08 NOTE — Patient Instructions (Addendum)
Perform epsom salt soaks to your nails a couple of times a day. You can use 1/4 cup of epsom salt in bowl of warm water. Apply antibiotic cream to infected nail bed followed by steroid cream. I've sent both of those to your pharmacy as well as prescription for oral antibiotic, doxycycline, for infected nail. Let me know how things are going in a couple of weeks as we may need to continue antibiotics. It was a pleasure meeting you today and thank you for allowing me to participate in your care. -Consuello Masse, NP

## 2022-11-08 NOTE — Progress Notes (Signed)
Symptom Management Clinic  [facility] A Department of the [facility]. [facility] [address] [place], [place] [address] [phone] (phone) [phone] (fax)  Patient Care Team: [place], Salvadore Oxford, NP as PCP - General (Internal Medicine) Hulen Luster, RN as Oncology Nurse Navigator Michaelyn Barter, MD as Consulting Physician (Oncology) Carmina Miller, MD as Consulting Physician (Radiation Oncology)   Name of the patient: Jennifer Garrison  [id number]  [birth year]   Date of visit: 11/08/22  Diagnosis- breast cancer  Chief complaint/ Reason for visit- nail changes  Heme/Onc history:  Oncology History  Breast cancer (HCC)  05/11/2022 Mammogram   Diagnostic mammogram and ultrasound- FINDINGS: Full field and spot compression views of the LEFT breast demonstrate a 1 cm irregular mass within the UPPER INNER LEFT breast, at the site of patient concern. The patient has retropectoral implants.   On physical exam, a firm palpable mass identified at the 11 o'clock position of the LEFT breast 8 cm from the nipple.   Targeted ultrasound is performed, showing a 0.9 x 0.8 x 1 cm irregular hypoechoic mass at the 11 o'clock position of the LEFT breast 8 cm from the nipple.   No abnormal LEFT axillary lymph nodes are noted.   IMPRESSION: 1. Suspicious 1 cm UPPER INNER LEFT breast mass. Tissue sampling is recommended. 2. No abnormal appearing LEFT axillary lymph nodes.   05/17/2022 Cancer Staging   Staging form: Breast, AJCC 8th Edition - Clinical stage from 05/17/2022: Stage IA (cT1b, cN0, cM0, G3, ER-, PR-, HER2+) - Signed by Michaelyn Barter, MD on 05/27/2022 Stage prefix: Initial diagnosis Histologic grading system: 3 grade system   05/17/2022 Initial Biopsy   DIAGNOSIS:  A. BREAST, LEFT, 11:00, 8 CM FROM NIPPLE, ULTRASOUND-GUIDED BIOPSY:   - INVASIVE MAMMARY CARCINOMA, NOS DUCTAL.   Size of invasive carcinoma: 10 mm in this  sample  Histologic grade of invasive carcinoma: Grade 3                       Glandular/tubular differentiation score: 3                       Nuclear pleomorphism score: 3                       Mitotic rate score: 2                       Total score: 8  Ductal carcinoma in situ: Not identified  Lymphovascular invasion: Not identified   Estrogen Receptor (ER) Status: NEGATIVE (LESS THAN 1%)   Progesterone Receptor (PgR) Status: NEGATIVE (LESS THAN 1%)   HER2 (by immunohistochemistry): POSITIVE (Score 3+)       Percentage of cells with uniform intense complete membrane  staining: 80%  Ki-67: Not performed     Genetic Testing   No pathogenic variants identified on the [facility] Multi-Cancer+RNA panel. VUS in MUTYH called c.1484G>A identified. The report date is 06/09/2022.  The Multi-Cancer + RNA Panel offered by [facility] includes sequencing and/or deletion/duplication analysis of the following 70 genes:  AIP*, ALK, APC*, ATM*, AXIN2*, BAP1*, BARD1*, BLM*, BMPR1A*, BRCA1*, BRCA2*, BRIP1*, CDC73*, CDH1*, CDK4, CDKN1B*, CDKN2A, CHEK2*, CTNNA1*, DICER1*, EPCAM, EGFR, FH*, FLCN*, GREM1, HOXB13, KIT, LZTR1, MAX*, MBD4, MEN1*, MET, MITF, MLH1*, MSH2*, MSH3*, MSH6*, MUTYH*, NF1*, NF2*, NTHL1*, PALB2*, PDGFRA, PMS2*, POLD1*, POLE*, POT1*, PRKAR1A*, PTCH1*, PTEN*, RAD51C*, RAD51D*, RB1*,  RET, SDHA*, SDHAF2*, SDHB*, SDHC*, SDHD*, SMAD4*, SMARCA4*, SMARCB1*, SMARCE1*, STK11*, SUFU*, TMEM127*, TP53*, TSC1*, TSC2*, VHL*. RNA analysis is performed for * genes.   06/09/2022 Surgery   S/p left breast lumpectomy with SLNB by Dr. Tonna Boehringer  Pathology showed 1.4 cm IDC, grade 3, DCIS present grade 2-3 with comedonecrosis, 0/3 lymph nodes negative, margins negative, pT1c.  ER/PR negative.  HER2 positive   07/18/2022 -  Chemotherapy   Patient is on Treatment Plan : BREAST Paclitaxel + Trastuzumab q7d / Trastuzumab q21d       Interval history- Patient is 62 year old female with above history of breast cancer,  currently undergoing radiation who presents to symptom management clinic for evaluation of nail changes. This began with her chemotherapy and she has seen her medical oncologist fo rthis problem. However, she continues to notice changes in her nails and is worried about possible fungus. She is using vinegar and water soaks daily. Has had antibiotics recently for possible infection. She wears finger cots over her nails to protect from injury.   Review of systems- Review of Systems  Constitutional:  Negative for fever and malaise/fatigue.  Skin:  Negative for itching and rash.  Neurological:  Positive for sensory change.  Psychiatric/Behavioral:  The patient is nervous/anxious.      Allergies  Allergen Reactions   Poison Ivy Extract Rash    Past Medical History:  Diagnosis Date   Allergy    Anxiety    Arthritis    Breast cancer (HCC) 05/27/2022   IBS (irritable bowel syndrome)    Trigger finger    Left Middle Finger 11/2016    Past Surgical History:  Procedure Laterality Date   ABDOMINAL HYSTERECTOMY  02/2016   Only cervix remains (done for prolapse)   AUGMENTATION MAMMAPLASTY Bilateral 2010   BREAST BIOPSY Left 05/17/2022   [place] BX, ribbon clip, path pending   BREAST BIOPSY Left 05/17/2022   [place] LT BREAST BX W LOC DEV 1ST LESION IMG BX SPEC US GUIDE 05/17/2022 [facility]   BREAST CYST ASPIRATION Left 2010   neg   BREAST ENHANCEMENT SURGERY  1998   CHOLECYSTECTOMY     COLONOSCOPY WITH PROPOFOL N/A 10/10/2019   Procedure: COLONOSCOPY WITH PROPOFOL;  Surgeon: Wyline Mood, MD;  Location: [facility] ENDOSCOPY;  Service: Gastroenterology;  Laterality: N/A;   PARTIAL MASTECTOMY WITH AXILLARY SENTINEL LYMPH NODE BIOPSY Left 06/09/2022   Procedure: PARTIAL MASTECTOMY WITH AXILLARY SENTINEL LYMPH NODE BIOPSY;  Surgeon: Sung Amabile, DO;  Location: [facility] ORS;  Service: General;  Laterality: Left;   PORTACATH PLACEMENT N/A 06/30/2022   Procedure: INSERTION PORT-A-CATH;  Surgeon: Sung Amabile,  DO;  Location: [facility] ORS;  Service: General;  Laterality: N/A;   TYMPANOPLASTY Left     Social History   Socioeconomic History   Marital status: Married    Spouse name: Onalee Hua   Number of children: 1   Years of education: Not on file   Highest education level: Not on file  Occupational History   Not on file  Tobacco Use   Smoking status: Former    Current packs/day: 0.00    Types: Cigarettes    Quit date: 09/09/1992    Years since quitting: 30.1   Smokeless tobacco: Current    Types: Chew   Tobacco comments:    Chewing tobacco   Vaping Use   Vaping status: Former  Substance and Sexual Activity   Alcohol use: Not Currently   Drug use: Yes    Types: Marijuana    Comment:  daily   Sexual activity: Yes    Birth control/protection: None  Other Topics Concern   Not on file  Social History Narrative   Not on file   Social Determinants of Health   Financial Resource Strain: Not on file  Food Insecurity: No Food Insecurity (05/27/2022)   Hunger Vital Sign    Worried About Running Out of Food in the Last Year: Never true    Ran Out of Food in the Last Year: Never true  Transportation Needs: No Transportation Needs (05/27/2022)   PRAPARE - Administrator, Civil Service (Medical): No    Lack of Transportation (Non-Medical): No  Physical Activity: Not on file  Stress: Not on file  Social Connections: Not on file  Intimate Partner Violence: Not At Risk (05/27/2022)   Humiliation, Afraid, Rape, and Kick questionnaire    Fear of Current or Ex-Partner: No    Emotionally Abused: No    Physically Abused: No    Sexually Abused: No    Family History  Problem Relation Age of Onset   Lung cancer Mother 10   Hypertension Mother    Lupus Father    Leukemia Cousin 6   Breast cancer Neg Hx      Current Outpatient Medications:    ADVANCED FIBER COMPLEX PO, Take 3 tablets by mouth daily. Gummies, Disp: , Rfl:    alendronate (FOSAMAX) 70 MG tablet, Take 70 mg by mouth once  a week., Disp: , Rfl:    buPROPion (WELLBUTRIN SR) 150 MG 12 hr tablet, TAKE 1 TABLET TWICE A DAY, Disp: 180 tablet, Rfl: 1   busPIRone (BUSPAR) 5 MG tablet, Take 1 tablet (5 mg total) by mouth 2 (two) times daily as needed., Disp: 180 tablet, Rfl: 1   fluconazole (DIFLUCAN) 150 MG tablet, Take 1 tablet (150 mg total) by mouth daily. Take as directed., Disp: 1 tablet, Rfl: 0   ibuprofen (ADVIL) 600 MG tablet, Take 1 tablet (600 mg total) by mouth every 6 (six) hours as needed., Disp: 30 tablet, Rfl: 0   lidocaine-prilocaine (EMLA) cream, Apply to affected area once, Disp: 30 g, Rfl: 3   LORazepam (ATIVAN) 0.5 MG tablet, Take 1 tablet (0.5 mg total) by mouth every 8 (eight) hours., Disp: 30 tablet, Rfl: 0   magic mouthwash (multi-ingredient) oral suspension, Take 5 mLs by mouth 4 (four) times daily as needed., Disp: 300 mL, Rfl: 0   magic mouthwash w/lidocaine SOLN, Take 5 mLs by mouth 4 (four) times daily., Disp: 480 mL, Rfl: 3   metroNIDAZOLE (METROCREAM) 0.75 % cream, Apply topically as needed., Disp: , Rfl:    ondansetron (ZOFRAN) 8 MG tablet, Take 1 tablet (8 mg total) by mouth every 8 (eight) hours as needed for nausea or vomiting., Disp: 30 tablet, Rfl: 1   OVER THE COUNTER MEDICATION, 1.5 tablets in the morning and at bedtime. Calcium Bone Strength Supplement1, Disp: , Rfl:    OVER THE COUNTER MEDICATION, 2 tablets daily. Vitex Berry, Disp: , Rfl:    OVER THE COUNTER MEDICATION, in the morning, at noon, and at bedtime. Milk Thisle Seeds Tea, Disp: , Rfl:    prochlorperazine (COMPAZINE) 10 MG tablet, Take 1 tablet (10 mg total) by mouth every 6 (six) hours as needed for nausea or vomiting., Disp: 30 tablet, Rfl: 1   triamcinolone cream (KENALOG) 0.1 %, Apply 1 Application topically as needed., Disp: , Rfl:    dicyclomine (BENTYL) 10 MG capsule, Take 1 capsule (10 mg total) by  mouth 3 (three) times daily as needed for up to 10 days for spasms., Disp: 30 capsule, Rfl: 0    HYDROcodone-acetaminophen (NORCO) 5-325 MG tablet, Take 1 tablet by mouth every 6 (six) hours as needed for up to 6 doses for moderate pain. (Patient not taking: Reported on 10/27/2022), Disp: 6 tablet, Rfl: 0  Physical exam:  Vitals:   11/08/22 1058  BP: 129/79  Pulse: 72  Resp: (!) 72  Temp: (!) 97.2 F (36.2 C)  TempSrc: Tympanic  Weight: 114 lb (51.7 kg)   Physical Exam Constitutional:      Appearance: She is not ill-appearing.  Skin:    Comments: Discoloration of nails- varied darkening. Lifting of nails. Right hand, middle finger, nail lifts nearly to bed. Evidence of debris under nails. All nails are discolored and lifting to some degree.  Left hand- index finger- reddened cuticle, inflamed, tender. No drainage or obvious infection.   Neurological:     Mental Status: She is alert.         Latest Ref Rng & Units 10/31/2022   10:44 AM  CMP  Glucose 70 - 99 mg/dL 161   BUN 8 - 23 mg/dL 20   Creatinine 0.96 - 1.00 mg/dL 0.45   Sodium 409 - 811 mmol/L 137   Potassium 3.5 - 5.1 mmol/L 3.9   Chloride 98 - 111 mmol/L 107   CO2 22 - 32 mmol/L 23   Calcium 8.9 - 10.3 mg/dL 9.0   Total Protein 6.5 - 8.1 g/dL 6.9   Total Bilirubin 0.3 - 1.2 mg/dL 0.8   Alkaline Phos 38 - 126 U/L 52   AST 15 - 41 U/L 17   ALT 0 - 44 U/L 13       Latest Ref Rng & Units 10/31/2022   10:44 AM  CBC  WBC 4.0 - 10.5 K/uL 9.8   Hemoglobin 12.0 - 15.0 g/dL 91.4   Hematocrit 78.2 - 46.0 % 38.5   Platelets 150 - 400 K/uL 209     No images are attached to the encounter.  ECHOCARDIOGRAM COMPLETE  Result Date: 10/25/2022    ECHOCARDIOGRAM REPORT   Patient Name:   CHIARA LAMONS Date of Exam: 10/25/2022 Medical Rec #:  [id number]    Height:       64.0 in Accession #:    [id number]   Weight:       118.4 lb Date of Birth:  Oct 23, 1960     BSA:          1.566 m Patient Age:    62 years     BP:           122/78 mmHg Patient Gender: F            HR:           64 bpm. Exam Location:  [facility] Procedure: 2D Echo,  Cardiac Doppler, Color Doppler, 3D Echo and Strain Analysis Indications:     CHEMO  History:         Patient has prior history of Echocardiogram examinations, most                  recent 07/14/2022. CHEMO.  Sonographer:     Mikki Harbor Referring Phys:  [id number] Michaelyn Barter Diagnosing Phys: Lorine Bears MD  Sonographer Comments: Image acquisition challenging due to breast implants. Global longitudinal strain was attempted. IMPRESSIONS  1. Left ventricular ejection fraction, by estimation, is 55 to 60%. The left ventricle has normal function.  The left ventricle has no regional wall motion abnormalities. Left ventricular diastolic parameters were normal. The average left ventricular global longitudinal strain is -17.8 %. The global longitudinal strain is normal.  2. Right ventricular systolic function is normal. The right ventricular size is normal. Tricuspid regurgitation signal is inadequate for assessing PA pressure.  3. The mitral valve is normal in structure. Trivial mitral valve regurgitation. No evidence of mitral stenosis.  4. The aortic valve is normal in structure. Aortic valve regurgitation is trivial. Aortic valve sclerosis is present, with no evidence of aortic valve stenosis.  5. The inferior vena cava is normal in size with greater than 50% respiratory variability, suggesting right atrial pressure of 3 mmHg. FINDINGS  Left Ventricle: Left ventricular ejection fraction, by estimation, is 55 to 60%. The left ventricle has normal function. The left ventricle has no regional wall motion abnormalities. The average left ventricular global longitudinal strain is -17.8 %. The global longitudinal strain is normal. The left ventricular internal cavity size was normal in size. There is no left ventricular hypertrophy. Left ventricular diastolic parameters were normal. Right Ventricle: The right ventricular size is normal. No increase in right ventricular wall thickness. Right ventricular systolic function  is normal. Tricuspid regurgitation signal is inadequate for assessing PA pressure. Left Atrium: Left atrial size was normal in size. Right Atrium: Right atrial size was normal in size. Pericardium: There is no evidence of pericardial effusion. Mitral Valve: The mitral valve is normal in structure. Trivial mitral valve regurgitation. No evidence of mitral valve stenosis. MV peak gradient, 3.3 mmHg. The mean mitral valve gradient is 1.0 mmHg. Tricuspid Valve: The tricuspid valve is normal in structure. Tricuspid valve regurgitation is mild . No evidence of tricuspid stenosis. Aortic Valve: The aortic valve is normal in structure. Aortic valve regurgitation is trivial. Aortic valve sclerosis is present, with no evidence of aortic valve stenosis. Aortic valve mean gradient measures 2.0 mmHg. Aortic valve peak gradient measures 5.2 mmHg. Aortic valve area, by VTI measures 3.08 cm. Pulmonic Valve: The pulmonic valve was normal in structure. Pulmonic valve regurgitation is trivial. No evidence of pulmonic stenosis. Aorta: The aortic root is normal in size and structure. Venous: The inferior vena cava is normal in size with greater than 50% respiratory variability, suggesting right atrial pressure of 3 mmHg. IAS/Shunts: No atrial level shunt detected by color flow Doppler.  LEFT VENTRICLE PLAX 2D LVIDd:         5.00 cm     Diastology LVIDs:         3.60 cm     LV e' medial:    9.57 cm/s LV PW:         0.70 cm     LV E/e' medial:  9.3 LV IVS:        0.90 cm     LV e' lateral:   18.70 cm/s LVOT diam:     1.90 cm     LV E/e' lateral: 4.8 LV SV:         71 LV SV Index:   45          2D Longitudinal Strain LVOT Area:     2.84 cm    2D Strain GLS Avg:     -17.8 %  LV Volumes (MOD) LV vol d, MOD A2C: 64.6 ml LV vol d, MOD A4C: 74.3 ml LV vol s, MOD A2C: 22.7 ml LV vol s, MOD A4C: 27.5 ml LV SV MOD A2C:     41.9  ml LV SV MOD A4C:     74.3 ml LV SV MOD BP:      45.7 ml RIGHT VENTRICLE RV Basal diam:  3.35 cm RV Mid diam:    2.70  cm RV S prime:     11.60 cm/s TAPSE (M-mode): 2.7 cm LEFT ATRIUM             Index        RIGHT ATRIUM           Index LA diam:        3.50 cm 2.24 cm/m   RA Area:     17.70 cm LA Vol (A2C):   53.1 ml 33.92 ml/m  RA Volume:   46.70 ml  29.83 ml/m LA Vol (A4C):   35.3 ml 22.55 ml/m LA Biplane Vol: 46.5 ml 29.70 ml/m  AORTIC VALVE                    PULMONIC VALVE AV Area (Vmax):    2.81 cm     PV Vmax:       0.72 m/s AV Area (Vmean):   2.57 cm     PV Peak grad:  2.1 mmHg AV Area (VTI):     3.08 cm AV Vmax:           114.00 cm/s AV Vmean:          70.200 cm/s AV VTI:            0.230 m AV Peak Grad:      5.2 mmHg AV Mean Grad:      2.0 mmHg LVOT Vmax:         113.00 cm/s LVOT Vmean:        63.600 cm/s LVOT VTI:          0.250 m LVOT/AV VTI ratio: 1.09  AORTA Ao Root diam: 3.60 cm Ao Asc diam:  3.60 cm MITRAL VALVE MV Area (PHT): 2.99 cm    SHUNTS MV Area VTI:   2.34 cm    Systemic VTI:  0.25 m MV Peak grad:  3.3 mmHg    Systemic Diam: 1.90 cm MV Mean grad:  1.0 mmHg MV Vmax:       0.90 m/s MV Vmean:      49.1 cm/s MV Decel Time: 254 msec MV E velocity: 89.10 cm/s MV A velocity: 65.00 cm/s MV E/A ratio:  1.37 Lorine Bears MD Electronically signed by Lorine Bears MD Signature Date/Time: 10/25/2022/11:23:07 AM    Final     Assessment and plan- Patient is a 62 y.o. female diagnosed with breast cancer, s/p chemotherapy and currently receiving radiation, who presents to Symptom Management clinic for    Onycholysis- secondary to chemotherapy. Recommend trimming nails short to remove lifting nail from underlying nail bed. This should not cause bleeding or discomfort. Perform epsom salt soaks twice a day with application of steroid and antibacterial creams. Avoid wet work particularly gardening and dish washing. Keep nails dry. Avoid cosmetics. Monitor for evidence of secondary infections. If worsening green coloration of nail, may need to culture for possible pseudomonas. She has evidence of healthy nail  growth appearing. Declines nail management today or referral to derm. Avoid mechanical cleaning under nails as this may introduce infection. Trim nails back daily until reattachment is seen. Advised that this will likely take months. I updated Dr Alena Bills.  Kirke Shaggy of left hand, index finger- restart doxycycline 100 mg BID x 14 days. Continue topical antibiotics to  cuticles and nail folds. Keep nail folds well hydrated with emollients to minimize dry cracking cuticles and possible infections.   Return to clinic if symptoms do not improve or worsen. Follow up with Dr Alena Bills as scheduled.   Visit Diagnosis 1. Onycholysis   2. Paronychia of finger of left hand   3. Skin changes related to chemotherapy     Patient expressed understanding and was in agreement with this plan. She also understands that She can call clinic at any time with any questions, concerns, or complaints.   Thank you for allowing me to participate in the care of this pleasant patient.   Consuello Masse, DNP, AGNP-C, AOCNP [facility] [phone]

## 2022-11-09 ENCOUNTER — Ambulatory Visit
Admission: RE | Admit: 2022-11-09 | Discharge: 2022-11-09 | Disposition: A | Discharge status: Home / Self Care | Payer: 59 | Source: Ambulatory Visit | Attending: Radiation Oncology | Admitting: Radiation Oncology

## 2022-11-09 ENCOUNTER — Other Ambulatory Visit: Payer: Self-pay

## 2022-11-09 DIAGNOSIS — D0512 Intraductal carcinoma in situ of left breast: Secondary | ICD-10-CM | POA: Diagnosis not present

## 2022-11-09 LAB — RAD ONC ARIA SESSION SUMMARY
Course Elapsed Days: 15
Plan Fractions Treated to Date: 12
Plan Prescribed Dose Per Fraction: 2.66 Gy
Plan Total Fractions Prescribed: 16
Plan Total Prescribed Dose: 42.56 Gy
Reference Point Dosage Given to Date: 31.92 Gy
Reference Point Session Dosage Given: 2.66 Gy
Session Number: 12

## 2022-11-10 ENCOUNTER — Ambulatory Visit
Admission: RE | Admit: 2022-11-10 | Discharge: 2022-11-10 | Disposition: A | Discharge status: Home / Self Care | Payer: 59 | Source: Ambulatory Visit | Attending: Radiation Oncology | Admitting: Radiation Oncology

## 2022-11-10 ENCOUNTER — Other Ambulatory Visit: Payer: Self-pay

## 2022-11-10 DIAGNOSIS — D0512 Intraductal carcinoma in situ of left breast: Secondary | ICD-10-CM | POA: Diagnosis not present

## 2022-11-10 LAB — RAD ONC ARIA SESSION SUMMARY
Course Elapsed Days: 16
Plan Fractions Treated to Date: 13
Plan Prescribed Dose Per Fraction: 2.66 Gy
Plan Total Fractions Prescribed: 16
Plan Total Prescribed Dose: 42.56 Gy
Reference Point Dosage Given to Date: 34.58 Gy
Reference Point Session Dosage Given: 2.66 Gy
Session Number: 13

## 2022-11-11 ENCOUNTER — Ambulatory Visit: Admission: RE | Admit: 2022-11-11 | Payer: 59 | Source: Ambulatory Visit

## 2022-11-11 ENCOUNTER — Other Ambulatory Visit: Payer: Self-pay

## 2022-11-11 DIAGNOSIS — D0512 Intraductal carcinoma in situ of left breast: Secondary | ICD-10-CM | POA: Diagnosis not present

## 2022-11-11 LAB — RAD ONC ARIA SESSION SUMMARY
Course Elapsed Days: 17
Plan Fractions Treated to Date: 14
Plan Prescribed Dose Per Fraction: 2.66 Gy
Plan Total Fractions Prescribed: 16
Plan Total Prescribed Dose: 42.56 Gy
Reference Point Dosage Given to Date: 37.24 Gy
Reference Point Session Dosage Given: 2.66 Gy
Session Number: 14

## 2022-11-14 ENCOUNTER — Ambulatory Visit
Admission: RE | Admit: 2022-11-14 | Discharge: 2022-11-14 | Disposition: A | Discharge status: Home / Self Care | Payer: 59 | Source: Ambulatory Visit | Attending: Radiation Oncology | Admitting: Radiation Oncology

## 2022-11-14 ENCOUNTER — Inpatient Hospital Stay: Payer: 59

## 2022-11-14 ENCOUNTER — Other Ambulatory Visit: Payer: Self-pay

## 2022-11-14 DIAGNOSIS — D0512 Intraductal carcinoma in situ of left breast: Secondary | ICD-10-CM | POA: Diagnosis not present

## 2022-11-14 DIAGNOSIS — C50912 Malignant neoplasm of unspecified site of left female breast: Secondary | ICD-10-CM

## 2022-11-14 LAB — RAD ONC ARIA SESSION SUMMARY
Course Elapsed Days: 20
Plan Fractions Treated to Date: 15
Plan Prescribed Dose Per Fraction: 2.66 Gy
Plan Total Fractions Prescribed: 16
Plan Total Prescribed Dose: 42.56 Gy
Reference Point Dosage Given to Date: 39.9 Gy
Reference Point Session Dosage Given: 2.66 Gy
Session Number: 15

## 2022-11-14 LAB — CBC (CANCER CENTER ONLY)
HCT: 40.6 % (ref 36.0–46.0)
Hemoglobin: 13 g/dL (ref 12.0–15.0)
MCH: 30.1 pg (ref 26.0–34.0)
MCHC: 32 g/dL (ref 30.0–36.0)
MCV: 94 fL (ref 80.0–100.0)
Platelet Count: 240 10*3/uL (ref 150–400)
RBC: 4.32 MIL/uL (ref 3.87–5.11)
RDW: 13 % (ref 11.5–15.5)
WBC Count: 9 10*3/uL (ref 4.0–10.5)
nRBC: 0 % (ref 0.0–0.2)

## 2022-11-15 ENCOUNTER — Other Ambulatory Visit: Payer: Self-pay

## 2022-11-15 ENCOUNTER — Ambulatory Visit: Admission: RE | Admit: 2022-11-15 | Payer: 59 | Source: Ambulatory Visit

## 2022-11-15 DIAGNOSIS — D0512 Intraductal carcinoma in situ of left breast: Secondary | ICD-10-CM | POA: Diagnosis not present

## 2022-11-15 LAB — RAD ONC ARIA SESSION SUMMARY
Course Elapsed Days: 21
Plan Fractions Treated to Date: 16
Plan Prescribed Dose Per Fraction: 2.66 Gy
Plan Total Fractions Prescribed: 16
Plan Total Prescribed Dose: 42.56 Gy
Reference Point Dosage Given to Date: 42.56 Gy
Reference Point Session Dosage Given: 2.66 Gy
Session Number: 16

## 2022-11-16 ENCOUNTER — Ambulatory Visit
Admission: RE | Admit: 2022-11-16 | Discharge: 2022-11-16 | Disposition: A | Discharge status: Home / Self Care | Payer: 59 | Source: Ambulatory Visit | Attending: Radiation Oncology | Admitting: Radiation Oncology

## 2022-11-16 ENCOUNTER — Encounter: Payer: Self-pay | Admitting: Internal Medicine

## 2022-11-16 ENCOUNTER — Other Ambulatory Visit: Payer: Self-pay

## 2022-11-16 DIAGNOSIS — D0512 Intraductal carcinoma in situ of left breast: Secondary | ICD-10-CM | POA: Diagnosis not present

## 2022-11-16 LAB — RAD ONC ARIA SESSION SUMMARY
Course Elapsed Days: 22
Plan Fractions Treated to Date: 1
Plan Prescribed Dose Per Fraction: 2 Gy
Plan Total Fractions Prescribed: 5
Plan Total Prescribed Dose: 10 Gy
Reference Point Dosage Given to Date: 2 Gy
Reference Point Session Dosage Given: 2 Gy
Session Number: 17

## 2022-11-17 ENCOUNTER — Other Ambulatory Visit: Payer: Self-pay

## 2022-11-17 ENCOUNTER — Ambulatory Visit
Admission: RE | Admit: 2022-11-17 | Discharge: 2022-11-17 | Disposition: A | Discharge status: Home / Self Care | Payer: 59 | Source: Ambulatory Visit | Attending: Radiation Oncology | Admitting: Radiation Oncology

## 2022-11-17 DIAGNOSIS — D0512 Intraductal carcinoma in situ of left breast: Secondary | ICD-10-CM | POA: Diagnosis not present

## 2022-11-17 LAB — RAD ONC ARIA SESSION SUMMARY
Course Elapsed Days: 23
Plan Fractions Treated to Date: 2
Plan Prescribed Dose Per Fraction: 2 Gy
Plan Total Fractions Prescribed: 5
Plan Total Prescribed Dose: 10 Gy
Reference Point Dosage Given to Date: 4 Gy
Reference Point Session Dosage Given: 2 Gy
Session Number: 18

## 2022-11-18 ENCOUNTER — Other Ambulatory Visit: Payer: Self-pay

## 2022-11-18 ENCOUNTER — Ambulatory Visit
Admission: RE | Admit: 2022-11-18 | Discharge: 2022-11-18 | Disposition: A | Discharge status: Home / Self Care | Payer: 59 | Source: Ambulatory Visit | Attending: Radiation Oncology | Admitting: Radiation Oncology

## 2022-11-18 DIAGNOSIS — D0512 Intraductal carcinoma in situ of left breast: Secondary | ICD-10-CM | POA: Diagnosis not present

## 2022-11-18 LAB — RAD ONC ARIA SESSION SUMMARY
Course Elapsed Days: 24
Plan Fractions Treated to Date: 3
Plan Prescribed Dose Per Fraction: 2 Gy
Plan Total Fractions Prescribed: 5
Plan Total Prescribed Dose: 10 Gy
Reference Point Dosage Given to Date: 6 Gy
Reference Point Session Dosage Given: 2 Gy
Session Number: 19

## 2022-11-21 ENCOUNTER — Inpatient Hospital Stay: Payer: 59 | Admitting: Internal Medicine

## 2022-11-21 ENCOUNTER — Inpatient Hospital Stay: Payer: 59

## 2022-11-21 ENCOUNTER — Ambulatory Visit
Admission: RE | Admit: 2022-11-21 | Discharge: 2022-11-21 | Disposition: A | Discharge status: Home / Self Care | Payer: 59 | Source: Ambulatory Visit | Attending: Radiation Oncology | Admitting: Radiation Oncology

## 2022-11-21 ENCOUNTER — Other Ambulatory Visit: Payer: Self-pay

## 2022-11-21 VITALS — BP 117/52 | HR 70 | Temp 98.6°F | Wt 118.0 lb

## 2022-11-21 DIAGNOSIS — D0512 Intraductal carcinoma in situ of left breast: Secondary | ICD-10-CM | POA: Diagnosis not present

## 2022-11-21 DIAGNOSIS — C50912 Malignant neoplasm of unspecified site of left female breast: Secondary | ICD-10-CM | POA: Diagnosis not present

## 2022-11-21 DIAGNOSIS — Z171 Estrogen receptor negative status [ER-]: Secondary | ICD-10-CM | POA: Diagnosis not present

## 2022-11-21 DIAGNOSIS — Z5112 Encounter for antineoplastic immunotherapy: Secondary | ICD-10-CM

## 2022-11-21 LAB — RAD ONC ARIA SESSION SUMMARY
Course Elapsed Days: 27
Plan Fractions Treated to Date: 4
Plan Prescribed Dose Per Fraction: 2 Gy
Plan Total Fractions Prescribed: 5
Plan Total Prescribed Dose: 10 Gy
Reference Point Dosage Given to Date: 8 Gy
Reference Point Session Dosage Given: 2 Gy
Session Number: 20

## 2022-11-21 LAB — COMPREHENSIVE METABOLIC PANEL
ALT: 16 U/L (ref 0–44)
AST: 18 U/L (ref 15–41)
Albumin: 3.8 g/dL (ref 3.5–5.0)
Alkaline Phosphatase: 53 U/L (ref 38–126)
Anion gap: 5 (ref 5–15)
BUN: 21 mg/dL (ref 8–23)
CO2: 25 mmol/L (ref 22–32)
Calcium: 8.9 mg/dL (ref 8.9–10.3)
Chloride: 108 mmol/L (ref 98–111)
Creatinine, Ser: 0.79 mg/dL (ref 0.44–1.00)
GFR, Estimated: >60 mL/min (ref >60)
Glucose, Bld: 102 mg/dL — ABNORMAL HIGH (ref 70–99)
Potassium: 4.2 mmol/L (ref 3.5–5.1)
Sodium: 138 mmol/L (ref 135–145)
Total Bilirubin: 0.6 mg/dL (ref 0.3–1.2)
Total Protein: 6.5 g/dL (ref 6.5–8.1)

## 2022-11-21 LAB — CBC WITH DIFFERENTIAL/PLATELET
Abs Immature Granulocytes: 0.02 10*3/uL (ref 0.00–0.07)
Basophils Absolute: 0.1 10*3/uL (ref 0.0–0.1)
Basophils Relative: 1 %
Eosinophils Absolute: 0.4 10*3/uL (ref 0.0–0.5)
Eosinophils Relative: 6 %
HCT: 38.3 % (ref 36.0–46.0)
Hemoglobin: 12.5 g/dL (ref 12.0–15.0)
Immature Granulocytes: 0 %
Lymphocytes Relative: 12 %
Lymphs Abs: 0.8 10*3/uL (ref 0.7–4.0)
MCH: 30.5 pg (ref 26.0–34.0)
MCHC: 32.6 g/dL (ref 30.0–36.0)
MCV: 93.4 fL (ref 80.0–100.0)
Monocytes Absolute: 0.5 10*3/uL (ref 0.1–1.0)
Monocytes Relative: 8 %
Neutro Abs: 4.8 10*3/uL (ref 1.7–7.7)
Neutrophils Relative %: 73 %
Platelets: 196 10*3/uL (ref 150–400)
RBC: 4.1 MIL/uL (ref 3.87–5.11)
RDW: 12.8 % (ref 11.5–15.5)
WBC: 6.6 10*3/uL (ref 4.0–10.5)
nRBC: 0 % (ref 0.0–0.2)

## 2022-11-21 MED ORDER — ACETAMINOPHEN 325 MG PO TABS
650.0000 mg | ORAL_TABLET | Freq: Once | ORAL | Status: AC
  Administered 2022-11-21: 650 mg via ORAL
  Filled 2022-11-21: qty 2

## 2022-11-21 MED ORDER — DIPHENHYDRAMINE HCL 25 MG PO CAPS
50.0000 mg | ORAL_CAPSULE | Freq: Once | ORAL | Status: AC
  Administered 2022-11-21: 50 mg via ORAL
  Filled 2022-11-21: qty 2

## 2022-11-21 MED ORDER — TRASTUZUMAB-ANNS CHEMO 150 MG IV SOLR
6.0000 mg/kg | Freq: Once | INTRAVENOUS | Status: AC
  Administered 2022-11-21: 300 mg via INTRAVENOUS
  Filled 2022-11-21: qty 14.29

## 2022-11-21 MED ORDER — HEPARIN SOD (PORK) LOCK FLUSH 100 UNIT/ML IV SOLN
500.0000 [IU] | Freq: Once | INTRAVENOUS | Status: AC | PRN
  Administered 2022-11-21: 500 [IU]
  Filled 2022-11-21: qty 5

## 2022-11-21 MED ORDER — SODIUM CHLORIDE 0.9 % IV SOLN
Freq: Once | INTRAVENOUS | Status: AC
  Filled 2022-11-21: qty 250

## 2022-11-21 NOTE — Progress Notes (Signed)
[facility] CONSULT NOTE  Patient Care Team: Lorre Munroe, NP as PCP - General (Internal Medicine) Hulen Luster, RN as Oncology Nurse Navigator Michaelyn Barter, MD as Consulting Physician (Oncology) Carmina Miller, MD as Consulting Physician (Radiation Oncology)   CANCER STAGING   Cancer Staging  Breast cancer [facility]) Staging form: Breast, AJCC 8th Edition - Clinical stage from 05/17/2022: Stage IA (cT1b, cN0, cM0, G3, ER-, PR-, HER2+) - Signed by Michaelyn Barter, MD on 05/27/2022 Stage prefix: Initial diagnosis Histologic grading system: 3 grade system  Current treatment Weekly paclitaxel with Herceptin (APT regimen) started on 07/18/2022  ASSESSMENT & PLAN:  Jennifer Garrison 62 y.o. female with pmh of osteoporosis and anxiety was referred to medical oncology for management of left breast stage IA invasive ductal cancer.  # Left breast IDC stage IA, ER/PR negative, HER2 positive # Encounter for cardiac monitoring -Patient felt a palpable mass in her left breast.   -S/p left breast lumpectomy with SLNB by Dr. Tonna Boehringer on 06/09/2022. Pathology showed 1.4 cm IDC, grade 3, DCIS present grade 2-3 with comedonecrosis, 0/3 lymph nodes negative, margins negative, pT1c.  ER/PR negative.  HER2 positive  -Patient reports small pea-sized around the nipple area in the left breast.  Diagnostic mammogram and ultrasound showed degrading fibroadenoma.  No concerns for malignancy.  Will repeat mammogram in 1 year due in July 2025.  -Completed weekly Taxol x 12 on 10/03/2022. Now on Herceptin every 3 weeks for total of 1 year.  Repeat echocardiogram showed EF of 55 to 60%.  Will continue monitoring on Herceptin.  -Getting adjuvant RT.  Completion date on 11/22/2022. -Labs reviewed and acceptable for treatment.  Will proceed with Herceptin 6 mg/kg every 3 weeks.  # Nail changes -Likely secondary to chemotherapy.  Has been treated with doxycycline for 7 days and then for 2 more weeks which she will  finish soon. -She would like to hold off on dermatology referral. -Continue with soaking in Epsom salt.  Use gloves with dishes gardening.  I will hold off on any antifungal treatment.  Nail changes have been stable.  # Anxiety -Worsened with the need of chemotherapy. -Continue with Ativan 0.5 mg every 8 hours as needed.  On Wellbutrin.  # Evaluated by genetics-testing negative  # Access-port.  Placed by Dr. Tonna Boehringer on 06/30/2022.  # Osteoporosis -On alendronate  No orders of the defined types were placed in this encounter.  RTC in 3 weeks for labs Herceptin only RTC in 6 weeks for MD visit, labs, Herceptin  The total time spent in the appointment was 30 minutes encounter with patients including review of chart and various tests results, discussions about plan of care and coordination of care plan   All questions were answered. The patient knows to call the clinic with any problems, questions or concerns. No barriers to learning was detected.  Michaelyn Barter, MD 8/26/20243:38 PM   HISTORY OF PRESENTING ILLNESS:  Jennifer Garrison 62 y.o. female with pmh of osteoporosis on alendronate and anxiety was referred to medical oncology for further management of stage Ia left breast invasive ductal cancer HER2 positive  Patient felt a palpable mass in her left breast mid February 2024.  She sought medical attention from PCP.  Further imaging as below.  INTERVAL HISTORY- Patient seen today as follow-up for her maintenance Herceptin. She has been doing well overall.  Plan for a trip.  Her major concern is the nail changes from chemotherapy.  Few of her nails are coming  off the nailbed.  She is soaking in Epsom salt.  Will finish with the doxycycline soon.   I have reviewed her chart and materials related to her cancer extensively and collaborated history with the patient. Summary of oncologic history is as follows: Oncology History  Breast cancer (HCC)  05/11/2022 Mammogram   Diagnostic  mammogram and ultrasound- FINDINGS: Full field and spot compression views of the LEFT breast demonstrate a 1 cm irregular mass within the UPPER INNER LEFT breast, at the site of patient concern. The patient has retropectoral implants.   On physical exam, a firm palpable mass identified at the 11 o'clock position of the LEFT breast 8 cm from the nipple.   Targeted ultrasound is performed, showing a 0.9 x 0.8 x 1 cm irregular hypoechoic mass at the 11 o'clock position of the LEFT breast 8 cm from the nipple.   No abnormal LEFT axillary lymph nodes are noted.   IMPRESSION: 1. Suspicious 1 cm UPPER INNER LEFT breast mass. Tissue sampling is recommended. 2. No abnormal appearing LEFT axillary lymph nodes.   05/17/2022 Cancer Staging   Staging form: Breast, AJCC 8th Edition - Clinical stage from 05/17/2022: Stage IA (cT1b, cN0, cM0, G3, ER-, PR-, HER2+) - Signed by Michaelyn Barter, MD on 05/27/2022 Stage prefix: Initial diagnosis Histologic grading system: 3 grade system   05/17/2022 Initial Biopsy   DIAGNOSIS:  A. BREAST, LEFT, 11:00, 8 CM FROM NIPPLE, ULTRASOUND-GUIDED BIOPSY:   - INVASIVE MAMMARY CARCINOMA, NOS DUCTAL.   Size of invasive carcinoma: 10 mm in this sample  Histologic grade of invasive carcinoma: Grade 3                       Glandular/tubular differentiation score: 3                       Nuclear pleomorphism score: 3                       Mitotic rate score: 2                       Total score: 8  Ductal carcinoma in situ: Not identified  Lymphovascular invasion: Not identified   Estrogen Receptor (ER) Status: NEGATIVE (LESS THAN 1%)   Progesterone Receptor (PgR) Status: NEGATIVE (LESS THAN 1%)   HER2 (by immunohistochemistry): POSITIVE (Score 3+)       Percentage of cells with uniform intense complete membrane  staining: 80%  Ki-67: Not performed     Genetic Testing   No pathogenic variants identified on the [facility] Multi-Cancer+RNA panel. VUS in MUTYH called  c.[id number] identified. The report date is 06/09/2022.  The Multi-Cancer + RNA Panel offered by [facility] includes sequencing and/or deletion/duplication analysis of the following 70 genes:  AIP*, ALK, APC*, ATM*, AXIN2*, BAP1*, BARD1*, BLM*, BMPR1A*, BRCA1*, BRCA2*, BRIP1*, CDC73*, CDH1*, CDK4, CDKN1B*, CDKN2A, CHEK2*, CTNNA1*, DICER1*, EPCAM, EGFR, FH*, FLCN*, GREM1, HOXB13, KIT, LZTR1, MAX*, MBD4, MEN1*, MET, MITF, MLH1*, MSH2*, MSH3*, MSH6*, MUTYH*, NF1*, NF2*, NTHL1*, PALB2*, PDGFRA, PMS2*, POLD1*, POLE*, POT1*, PRKAR1A*, PTCH1*, PTEN*, RAD51C*, RAD51D*, RB1*, RET, SDHA*, SDHAF2*, SDHB*, SDHC*, SDHD*, SMAD4*, SMARCA4*, SMARCB1*, SMARCE1*, STK11*, SUFU*, TMEM127*, TP53*, TSC1*, TSC2*, VHL*. RNA analysis is performed for * genes.   06/09/2022 Surgery   S/p left breast lumpectomy with SLNB by Dr. Tonna Boehringer  Pathology showed 1.4 cm IDC, grade 3, DCIS present grade 2-3 with comedonecrosis, 0/3 lymph nodes negative, margins negative, pT1c.  ER/PR negative.  HER2 positive   07/18/2022 -  Chemotherapy   Patient is on Treatment Plan : BREAST Paclitaxel + Trastuzumab q7d / Trastuzumab q21d      Menarche age 78 or 67 Age at first birth 83 Used birth control pills and Depo more than 5 years Menopause was age 60 Hysterectomy yes for prolapsed uterus HRT no History of breast biopsies yes for breast cyst in left lower Family history of lung cancer in mother who was smoker  MEDICAL HISTORY:  Past Medical History:  Diagnosis Date   Allergy    Anxiety    Arthritis    Breast cancer (HCC) 05/27/2022   IBS (irritable bowel syndrome)    Trigger finger    Left Middle Finger 11/2016    SURGICAL HISTORY: Past Surgical History:  Procedure Laterality Date   ABDOMINAL HYSTERECTOMY  02/2016   Only cervix remains (done for prolapse)   AUGMENTATION MAMMAPLASTY Bilateral 2010   BREAST BIOPSY Left 05/17/2022   [place] BX, ribbon clip, path pending   BREAST BIOPSY Left 05/17/2022   [place] LT BREAST BX W LOC DEV 1ST LESION  IMG BX SPEC US GUIDE 05/17/2022 [facility]   BREAST CYST ASPIRATION Left 2010   neg   BREAST ENHANCEMENT SURGERY  1998   CHOLECYSTECTOMY     COLONOSCOPY WITH PROPOFOL N/A 10/10/2019   Procedure: COLONOSCOPY WITH PROPOFOL;  Surgeon: Wyline Mood, MD;  Location: [facility] ENDOSCOPY;  Service: Gastroenterology;  Laterality: N/A;   PARTIAL MASTECTOMY WITH AXILLARY SENTINEL LYMPH NODE BIOPSY Left 06/09/2022   Procedure: PARTIAL MASTECTOMY WITH AXILLARY SENTINEL LYMPH NODE BIOPSY;  Surgeon: Sung Amabile, DO;  Location: [facility] ORS;  Service: General;  Laterality: Left;   PORTACATH PLACEMENT N/A 06/30/2022   Procedure: INSERTION PORT-A-CATH;  Surgeon: Sung Amabile, DO;  Location: [facility] ORS;  Service: General;  Laterality: N/A;   TYMPANOPLASTY Left     SOCIAL HISTORY: Social History   Socioeconomic History   Marital status: Married    Spouse name: Onalee Hua   Number of children: 1   Years of education: Not on file   Highest education level: Not on file  Occupational History   Not on file  Tobacco Use   Smoking status: Former    Current packs/day: 0.00    Types: Cigarettes    Quit date: 09/09/1992    Years since quitting: 30.2   Smokeless tobacco: Current    Types: Chew   Tobacco comments:    Chewing tobacco   Vaping Use   Vaping status: Former  Substance and Sexual Activity   Alcohol use: Not Currently   Drug use: Yes    Types: Marijuana    Comment: daily   Sexual activity: Yes    Birth control/protection: None  Other Topics Concern   Not on file  Social History Narrative   Not on file   Social Determinants of Health   Financial Resource Strain: Not on file  Food Insecurity: No Food Insecurity (05/27/2022)   Hunger Vital Sign    Worried About Running Out of Food in the Last Year: Never true    Ran Out of Food in the Last Year: Never true  Transportation Needs: No Transportation Needs (05/27/2022)   PRAPARE - Administrator, Civil Service (Medical): No    Lack of  Transportation (Non-Medical): No  Physical Activity: Not on file  Stress: Not on file  Social Connections: Not on file  Intimate Partner Violence: Not At Risk (05/27/2022)   Humiliation, Afraid, Rape,  and Kick questionnaire    Fear of Current or Ex-Partner: No    Emotionally Abused: No    Physically Abused: No    Sexually Abused: No    FAMILY HISTORY: Family History  Problem Relation Age of Onset   Lung cancer Mother 28   Hypertension Mother    Lupus Father    Leukemia Cousin 6   Breast cancer Neg Hx     ALLERGIES:  is allergic to poison ivy extract.  MEDICATIONS:  Current Outpatient Medications  Medication Sig Dispense Refill   ADVANCED FIBER COMPLEX PO Take 3 tablets by mouth daily. Gummies     alendronate (FOSAMAX) 70 MG tablet Take 70 mg by mouth once a week.     buPROPion (WELLBUTRIN SR) 150 MG 12 hr tablet TAKE 1 TABLET TWICE A DAY 180 tablet 1   busPIRone (BUSPAR) 5 MG tablet Take 1 tablet (5 mg total) by mouth 2 (two) times daily as needed. 180 tablet 1   doxycycline (VIBRA-TABS) 100 MG tablet Take 1 tablet (100 mg total) by mouth 2 (two) times daily for 14 days. For paronychia 28 tablet 0   ibuprofen (ADVIL) 600 MG tablet Take 1 tablet (600 mg total) by mouth every 6 (six) hours as needed. 30 tablet 0   lidocaine-prilocaine (EMLA) cream Apply to affected area once 30 g 3   LORazepam (ATIVAN) 0.5 MG tablet Take 1 tablet (0.5 mg total) by mouth every 8 (eight) hours. 30 tablet 0   magic mouthwash (multi-ingredient) oral suspension Take 5 mLs by mouth 4 (four) times daily as needed. 300 mL 0   metroNIDAZOLE (METROCREAM) 0.75 % cream Apply topically as needed.     mupirocin cream (BACTROBAN) 2 % Apply 1 Application topically 2 (two) times daily. Apply to skin of nails twice a day for chemotherapy nail changes. 30 g 0   ondansetron (ZOFRAN) 8 MG tablet Take 1 tablet (8 mg total) by mouth every 8 (eight) hours as needed for nausea or vomiting. 30 tablet 1   OVER THE COUNTER  MEDICATION 1.5 tablets in the morning and at bedtime. Calcium Bone Strength Supplement1     OVER THE COUNTER MEDICATION 2 tablets daily. Vitex Berry     OVER THE COUNTER MEDICATION in the morning, at noon, and at bedtime. Milk Thisle Seeds Tea     prochlorperazine (COMPAZINE) 10 MG tablet Take 1 tablet (10 mg total) by mouth every 6 (six) hours as needed for nausea or vomiting. 30 tablet 1   triamcinolone cream (KENALOG) 0.1 % Apply 1 Application topically 2 (two) times daily. Apply to nail plates twice a day for nail lifting due to chemotherapy. 28 g 0   dicyclomine (BENTYL) 10 MG capsule Take 1 capsule (10 mg total) by mouth 3 (three) times daily as needed for up to 10 days for spasms. 30 capsule 0   HYDROcodone-acetaminophen (NORCO) 5-325 MG tablet Take 1 tablet by mouth every 6 (six) hours as needed for up to 6 doses for moderate pain. (Patient not taking: Reported on 11/21/2022) 6 tablet 0   No current facility-administered medications for this visit.    REVIEW OF SYSTEMS:   Pertinent information mentioned in HPI All other systems were reviewed with the patient and are negative.  PHYSICAL EXAMINATION: ECOG PERFORMANCE STATUS: 0 - Asymptomatic  Vitals:   11/21/22 0939  BP: (!) 117/52  Pulse: 70  Temp: 98.6 F (37 C)  SpO2: 100%        Filed Weights  11/21/22 0939  Weight: 118 lb (53.5 kg)        GENERAL:alert, no distress and comfortable SKIN: skin color, texture, turgor are normal, no rashes or significant lesions EYES: normal, conjunctiva are pink and non-injected, sclera clear OROPHARYNX:no exudate, no erythema and lips, buccal mucosa, and tongue normal  NECK: supple, thyroid normal size, non-tender, without nodularity LYMPH:  no palpable lymphadenopathy in the cervical, axillary or inguinal LUNGS: clear to auscultation and percussion with normal breathing effort HEART: regular rate & rhythm and no murmurs and no lower extremity edema ABDOMEN:abdomen soft,  non-tender and normal bowel sounds Musculoskeletal:no cyanosis of digits and no clubbing  PSYCH: alert & oriented x 3 with fluent speech NEURO: no focal motor/sensory deficits  LABORATORY DATA:  I have reviewed the data as listed Lab Results  Component Value Date   WBC 6.6 11/21/2022   HGB 12.5 11/21/2022   HCT 38.3 11/21/2022   MCV 93.4 11/21/2022   PLT 196 11/21/2022   Recent Labs    10/10/22 0902 10/31/22 1044 11/21/22 0918  NA 136 137 138  K 4.0 3.9 4.2  CL 106 107 108  CO2 24 23 25   GLUCOSE 103* 114* 102*  BUN 20 20 21   CREATININE 0.88 0.78 0.79  CALCIUM 8.2* 9.0 8.9  GFRNONAA >60 >60 >60  PROT 6.6 6.9 6.5  ALBUMIN 3.7 3.9 3.8  AST 16 17 18   ALT 13 13 16   ALKPHOS 42 52 53  BILITOT 0.5 0.8 0.6    RADIOGRAPHIC STUDIES: I have personally reviewed the radiological images as listed and agreed with the findings in the report. ECHOCARDIOGRAM COMPLETE  Result Date: 10/25/2022    ECHOCARDIOGRAM REPORT   Patient Name:   AAHANA MOSES Date of Exam: 10/25/2022 Medical Rec #:  [id number]    Height:       64.0 in Accession #:    [id number]   Weight:       118.4 lb Date of Birth:  1960-09-15     BSA:          1.566 m Patient Age:    62 years     BP:           122/78 mmHg Patient Gender: F            HR:           64 bpm. Exam Location:  [facility] Procedure: 2D Echo, Cardiac Doppler, Color Doppler, 3D Echo and Strain Analysis Indications:     CHEMO  History:         Patient has prior history of Echocardiogram examinations, most                  recent 07/14/2022. CHEMO.  Sonographer:     Mikki Harbor Referring Phys:  [id number] Michaelyn Barter Diagnosing Phys: Lorine Bears MD  Sonographer Comments: Image acquisition challenging due to breast implants. Global longitudinal strain was attempted. IMPRESSIONS  1. Left ventricular ejection fraction, by estimation, is 55 to 60%. The left ventricle has normal function. The left ventricle has no regional wall motion abnormalities. Left ventricular  diastolic parameters were normal. The average left ventricular global longitudinal strain is -17.8 %. The global longitudinal strain is normal.  2. Right ventricular systolic function is normal. The right ventricular size is normal. Tricuspid regurgitation signal is inadequate for assessing PA pressure.  3. The mitral valve is normal in structure. Trivial mitral valve regurgitation. No evidence of mitral stenosis.  4. The aortic valve is normal in  structure. Aortic valve regurgitation is trivial. Aortic valve sclerosis is present, with no evidence of aortic valve stenosis.  5. The inferior vena cava is normal in size with greater than 50% respiratory variability, suggesting right atrial pressure of 3 mmHg. FINDINGS  Left Ventricle: Left ventricular ejection fraction, by estimation, is 55 to 60%. The left ventricle has normal function. The left ventricle has no regional wall motion abnormalities. The average left ventricular global longitudinal strain is -17.8 %. The global longitudinal strain is normal. The left ventricular internal cavity size was normal in size. There is no left ventricular hypertrophy. Left ventricular diastolic parameters were normal. Right Ventricle: The right ventricular size is normal. No increase in right ventricular wall thickness. Right ventricular systolic function is normal. Tricuspid regurgitation signal is inadequate for assessing PA pressure. Left Atrium: Left atrial size was normal in size. Right Atrium: Right atrial size was normal in size. Pericardium: There is no evidence of pericardial effusion. Mitral Valve: The mitral valve is normal in structure. Trivial mitral valve regurgitation. No evidence of mitral valve stenosis. MV peak gradient, 3.3 mmHg. The mean mitral valve gradient is 1.0 mmHg. Tricuspid Valve: The tricuspid valve is normal in structure. Tricuspid valve regurgitation is mild . No evidence of tricuspid stenosis. Aortic Valve: The aortic valve is normal in structure.  Aortic valve regurgitation is trivial. Aortic valve sclerosis is present, with no evidence of aortic valve stenosis. Aortic valve mean gradient measures 2.0 mmHg. Aortic valve peak gradient measures 5.2 mmHg. Aortic valve area, by VTI measures 3.08 cm. Pulmonic Valve: The pulmonic valve was normal in structure. Pulmonic valve regurgitation is trivial. No evidence of pulmonic stenosis. Aorta: The aortic root is normal in size and structure. Venous: The inferior vena cava is normal in size with greater than 50% respiratory variability, suggesting right atrial pressure of 3 mmHg. IAS/Shunts: No atrial level shunt detected by color flow Doppler.  LEFT VENTRICLE PLAX 2D LVIDd:         5.00 cm     Diastology LVIDs:         3.60 cm     LV e' medial:    9.57 cm/s LV PW:         0.70 cm     LV E/e' medial:  9.3 LV IVS:        0.90 cm     LV e' lateral:   18.70 cm/s LVOT diam:     1.90 cm     LV E/e' lateral: 4.8 LV SV:         71 LV SV Index:   45          2D Longitudinal Strain LVOT Area:     2.84 cm    2D Strain GLS Avg:     -17.8 %  LV Volumes (MOD) LV vol d, MOD A2C: 64.6 ml LV vol d, MOD A4C: 74.3 ml LV vol s, MOD A2C: 22.7 ml LV vol s, MOD A4C: 27.5 ml LV SV MOD A2C:     41.9 ml LV SV MOD A4C:     74.3 ml LV SV MOD BP:      45.7 ml RIGHT VENTRICLE RV Basal diam:  3.35 cm RV Mid diam:    2.70 cm RV S prime:     11.60 cm/s TAPSE (M-mode): 2.7 cm LEFT ATRIUM             Index        RIGHT ATRIUM  Index LA diam:        3.50 cm 2.24 cm/m   RA Area:     17.70 cm LA Vol (A2C):   53.1 ml 33.92 ml/m  RA Volume:   46.70 ml  29.83 ml/m LA Vol (A4C):   35.3 ml 22.55 ml/m LA Biplane Vol: 46.5 ml 29.70 ml/m  AORTIC VALVE                    PULMONIC VALVE AV Area (Vmax):    2.81 cm     PV Vmax:       0.72 m/s AV Area (Vmean):   2.57 cm     PV Peak grad:  2.1 mmHg AV Area (VTI):     3.08 cm AV Vmax:           114.00 cm/s AV Vmean:          70.200 cm/s AV VTI:            0.230 m AV Peak Grad:      5.2 mmHg AV Mean  Grad:      2.0 mmHg LVOT Vmax:         113.00 cm/s LVOT Vmean:        63.600 cm/s LVOT VTI:          0.250 m LVOT/AV VTI ratio: 1.09  AORTA Ao Root diam: 3.60 cm Ao Asc diam:  3.60 cm MITRAL VALVE MV Area (PHT): 2.99 cm    SHUNTS MV Area VTI:   2.34 cm    Systemic VTI:  0.25 m MV Peak grad:  3.3 mmHg    Systemic Diam: 1.90 cm MV Mean grad:  1.0 mmHg MV Vmax:       0.90 m/s MV Vmean:      49.1 cm/s MV Decel Time: 254 msec MV E velocity: 89.10 cm/s MV A velocity: 65.00 cm/s MV E/A ratio:  1.37 Lorine Bears MD Electronically signed by Lorine Bears MD Signature Date/Time: 10/25/2022/11:23:07 AM    Final

## 2022-11-21 NOTE — Progress Notes (Signed)
Patient she is doing about the same, nail problem continues to worsen, and she is very fatigued since starting her radiation treatment. Patient and her husband are going out of town this Thursday and will be gone for 2 weeks, and she would like to discuss refilling her hydrocodone-acetaminophen for pain.

## 2022-11-22 ENCOUNTER — Ambulatory Visit: Admission: RE | Admit: 2022-11-22 | Payer: 59 | Source: Ambulatory Visit

## 2022-11-22 ENCOUNTER — Other Ambulatory Visit: Payer: Self-pay

## 2022-11-22 ENCOUNTER — Encounter: Payer: Self-pay | Admitting: *Deleted

## 2022-11-22 DIAGNOSIS — D0512 Intraductal carcinoma in situ of left breast: Secondary | ICD-10-CM | POA: Diagnosis not present

## 2022-11-22 LAB — RAD ONC ARIA SESSION SUMMARY
Course Elapsed Days: 28
Plan Fractions Treated to Date: 5
Plan Prescribed Dose Per Fraction: 2 Gy
Plan Total Fractions Prescribed: 5
Plan Total Prescribed Dose: 10 Gy
Reference Point Dosage Given to Date: 10 Gy
Reference Point Session Dosage Given: 2 Gy
Session Number: 21

## 2022-12-12 ENCOUNTER — Inpatient Hospital Stay: Payer: 59 | Attending: Internal Medicine

## 2022-12-12 ENCOUNTER — Inpatient Hospital Stay: Payer: 59

## 2022-12-12 VITALS — BP 105/55 | HR 64 | Temp 98.5°F | Resp 18 | Wt 117.3 lb

## 2022-12-12 DIAGNOSIS — C50912 Malignant neoplasm of unspecified site of left female breast: Secondary | ICD-10-CM

## 2022-12-12 DIAGNOSIS — D0512 Intraductal carcinoma in situ of left breast: Secondary | ICD-10-CM | POA: Insufficient documentation

## 2022-12-12 DIAGNOSIS — Z5112 Encounter for antineoplastic immunotherapy: Secondary | ICD-10-CM | POA: Insufficient documentation

## 2022-12-12 LAB — CBC WITH DIFFERENTIAL/PLATELET
Abs Immature Granulocytes: 0.02 10*3/uL (ref 0.00–0.07)
Basophils Absolute: 0 10*3/uL (ref 0.0–0.1)
Basophils Relative: 1 %
Eosinophils Absolute: 0.3 10*3/uL (ref 0.0–0.5)
Eosinophils Relative: 4 %
HCT: 40.3 % (ref 36.0–46.0)
Hemoglobin: 12.9 g/dL (ref 12.0–15.0)
Immature Granulocytes: 0 %
Lymphocytes Relative: 12 %
Lymphs Abs: 1 10*3/uL (ref 0.7–4.0)
MCH: 29.3 pg (ref 26.0–34.0)
MCHC: 32 g/dL (ref 30.0–36.0)
MCV: 91.6 fL (ref 80.0–100.0)
Monocytes Absolute: 0.5 10*3/uL (ref 0.1–1.0)
Monocytes Relative: 6 %
Neutro Abs: 6.2 10*3/uL (ref 1.7–7.7)
Neutrophils Relative %: 77 %
Platelets: 200 10*3/uL (ref 150–400)
RBC: 4.4 MIL/uL (ref 3.87–5.11)
RDW: 12.4 % (ref 11.5–15.5)
WBC: 8 10*3/uL (ref 4.0–10.5)
nRBC: 0 % (ref 0.0–0.2)

## 2022-12-12 LAB — BASIC METABOLIC PANEL - CANCER CENTER ONLY
Anion gap: 8 (ref 5–15)
BUN: 19 mg/dL (ref 8–23)
CO2: 21 mmol/L — ABNORMAL LOW (ref 22–32)
Calcium: 8.3 mg/dL — ABNORMAL LOW (ref 8.9–10.3)
Chloride: 109 mmol/L (ref 98–111)
Creatinine: 0.87 mg/dL (ref 0.44–1.00)
GFR, Estimated: >60 mL/min (ref >60)
Glucose, Bld: 123 mg/dL — ABNORMAL HIGH (ref 70–99)
Potassium: 3.4 mmol/L — ABNORMAL LOW (ref 3.5–5.1)
Sodium: 138 mmol/L (ref 135–145)

## 2022-12-12 MED ORDER — ACETAMINOPHEN 325 MG PO TABS
650.0000 mg | ORAL_TABLET | Freq: Once | ORAL | Status: AC
  Administered 2022-12-12: 650 mg via ORAL
  Filled 2022-12-12: qty 2

## 2022-12-12 MED ORDER — TRASTUZUMAB-ANNS CHEMO 150 MG IV SOLR
6.0000 mg/kg | Freq: Once | INTRAVENOUS | Status: AC
  Administered 2022-12-12: 300 mg via INTRAVENOUS
  Filled 2022-12-12: qty 14.29

## 2022-12-12 MED ORDER — DIPHENHYDRAMINE HCL 25 MG PO CAPS
50.0000 mg | ORAL_CAPSULE | Freq: Once | ORAL | Status: AC
  Administered 2022-12-12: 50 mg via ORAL
  Filled 2022-12-12: qty 2

## 2022-12-12 MED ORDER — SODIUM CHLORIDE 0.9 % IV SOLN
Freq: Once | INTRAVENOUS | Status: AC
  Filled 2022-12-12: qty 250

## 2022-12-12 MED ORDER — HEPARIN SOD (PORK) LOCK FLUSH 100 UNIT/ML IV SOLN
500.0000 [IU] | Freq: Once | INTRAVENOUS | Status: AC | PRN
  Administered 2022-12-12: 500 [IU]
  Filled 2022-12-12: qty 5

## 2022-12-12 NOTE — Patient Instructions (Signed)
[facility]  Discharge Instructions: Thank you for choosing [facility] to provide your oncology and hematology care.  If you have a lab appointment with the Cancer Center, please go directly to the Cancer Center and check in at the registration area.  Wear comfortable clothing and clothing appropriate for easy access to any Portacath or PICC line.   We strive to give you quality time with your provider. You may need to reschedule your appointment if you arrive late (15 or more minutes).  Arriving late affects you and other patients whose appointments are after yours.  Also, if you miss three or more appointments without notifying the office, you may be dismissed from the clinic at the provider's discretion.      For prescription refill requests, have your pharmacy contact our office and allow 72 hours for refills to be completed.    Today you received the following chemotherapy and/or immunotherapy agents kanjinti    To help prevent nausea and vomiting after your treatment, we encourage you to take your nausea medication as directed.  BELOW ARE SYMPTOMS THAT SHOULD BE REPORTED IMMEDIATELY: *FEVER GREATER THAN 100.4 F (38 C) OR HIGHER *CHILLS OR SWEATING *NAUSEA AND VOMITING THAT IS NOT CONTROLLED WITH YOUR NAUSEA MEDICATION *UNUSUAL SHORTNESS OF BREATH *UNUSUAL BRUISING OR BLEEDING *URINARY PROBLEMS (pain or burning when urinating, or frequent urination) *BOWEL PROBLEMS (unusual diarrhea, constipation, pain near the anus) TENDERNESS IN MOUTH AND THROAT WITH OR WITHOUT PRESENCE OF ULCERS (sore throat, sores in mouth, or a toothache) UNUSUAL RASH, SWELLING OR PAIN  UNUSUAL VAGINAL DISCHARGE OR ITCHING   Items with * indicate a potential emergency and should be followed up as soon as possible or go to the Emergency Department if any problems should occur.  Please show the CHEMOTHERAPY ALERT CARD or IMMUNOTHERAPY ALERT CARD at check-in to the  Emergency Department and triage nurse.  Should you have questions after your visit or need to cancel or reschedule your appointment, please contact [facility]  [phone] and follow the prompts.  Office hours are 8:00 a.m. to 4:30 p.m. Monday - Friday. Please note that voicemails left after 4:00 p.m. may not be returned until the following business day.  We are closed weekends and major holidays. You have access to a nurse at all times for urgent questions. Please call the main number to the clinic [phone] and follow the prompts.  For any non-urgent questions, you may also contact your provider using MyChart. We now offer e-Visits for anyone 47 and older to request care online for non-urgent symptoms. For details visit [email or web address].   Also download the MyChart app! Go to the app store, search "MyChart", open the app, select [facility], and log in with your MyChart username and password.

## 2022-12-14 ENCOUNTER — Other Ambulatory Visit: Payer: Self-pay

## 2022-12-22 ENCOUNTER — Ambulatory Visit: Payer: 59 | Admitting: Radiation Oncology

## 2022-12-27 ENCOUNTER — Encounter: Payer: Self-pay | Admitting: Internal Medicine

## 2023-01-02 ENCOUNTER — Inpatient Hospital Stay (HOSPITAL_BASED_OUTPATIENT_CLINIC_OR_DEPARTMENT_OTHER): Payer: 59 | Admitting: Internal Medicine

## 2023-01-02 ENCOUNTER — Other Ambulatory Visit: Payer: Self-pay | Admitting: *Deleted

## 2023-01-02 ENCOUNTER — Inpatient Hospital Stay: Payer: 59 | Attending: Internal Medicine

## 2023-01-02 ENCOUNTER — Inpatient Hospital Stay: Payer: 59

## 2023-01-02 VITALS — BP 99/55 | HR 67 | Temp 97.2°F | Wt 118.0 lb

## 2023-01-02 DIAGNOSIS — Z171 Estrogen receptor negative status [ER-]: Secondary | ICD-10-CM | POA: Diagnosis not present

## 2023-01-02 DIAGNOSIS — Z79899 Other long term (current) drug therapy: Secondary | ICD-10-CM | POA: Diagnosis not present

## 2023-01-02 DIAGNOSIS — C50912 Malignant neoplasm of unspecified site of left female breast: Secondary | ICD-10-CM | POA: Diagnosis not present

## 2023-01-02 DIAGNOSIS — Z5112 Encounter for antineoplastic immunotherapy: Secondary | ICD-10-CM

## 2023-01-02 DIAGNOSIS — Z1731 Human epidermal growth factor receptor 2 positive status: Secondary | ICD-10-CM | POA: Insufficient documentation

## 2023-01-02 DIAGNOSIS — F419 Anxiety disorder, unspecified: Secondary | ICD-10-CM | POA: Insufficient documentation

## 2023-01-02 LAB — COMPREHENSIVE METABOLIC PANEL
ALT: 13 U/L (ref 0–44)
AST: 15 U/L (ref 15–41)
Albumin: 3.8 g/dL (ref 3.5–5.0)
Alkaline Phosphatase: 46 U/L (ref 38–126)
Anion gap: 6 (ref 5–15)
BUN: 18 mg/dL (ref 8–23)
CO2: 23 mmol/L (ref 22–32)
Calcium: 8.3 mg/dL — ABNORMAL LOW (ref 8.9–10.3)
Chloride: 108 mmol/L (ref 98–111)
Creatinine, Ser: 0.9 mg/dL (ref 0.44–1.00)
GFR, Estimated: >60 mL/min (ref >60)
Glucose, Bld: 134 mg/dL — ABNORMAL HIGH (ref 70–99)
Potassium: 3.7 mmol/L (ref 3.5–5.1)
Sodium: 137 mmol/L (ref 135–145)
Total Bilirubin: 0.6 mg/dL (ref 0.3–1.2)
Total Protein: 6.8 g/dL (ref 6.5–8.1)

## 2023-01-02 LAB — CBC WITH DIFFERENTIAL/PLATELET
Abs Immature Granulocytes: 0.03 10*3/uL (ref 0.00–0.07)
Basophils Absolute: 0.1 10*3/uL (ref 0.0–0.1)
Basophils Relative: 1 %
Eosinophils Absolute: 0.3 10*3/uL (ref 0.0–0.5)
Eosinophils Relative: 4 %
HCT: 39.9 % (ref 36.0–46.0)
Hemoglobin: 12.9 g/dL (ref 12.0–15.0)
Immature Granulocytes: 0 %
Lymphocytes Relative: 13 %
Lymphs Abs: 1 10*3/uL (ref 0.7–4.0)
MCH: 29.3 pg (ref 26.0–34.0)
MCHC: 32.3 g/dL (ref 30.0–36.0)
MCV: 90.7 fL (ref 80.0–100.0)
Monocytes Absolute: 0.5 10*3/uL (ref 0.1–1.0)
Monocytes Relative: 6 %
Neutro Abs: 6 10*3/uL (ref 1.7–7.7)
Neutrophils Relative %: 76 %
Platelets: 236 10*3/uL (ref 150–400)
RBC: 4.4 MIL/uL (ref 3.87–5.11)
RDW: 12.3 % (ref 11.5–15.5)
WBC: 7.8 10*3/uL (ref 4.0–10.5)
nRBC: 0 % (ref 0.0–0.2)

## 2023-01-02 LAB — VITAMIN D 25 HYDROXY (VIT D DEFICIENCY, FRACTURES): Vit D, 25-Hydroxy: 43.92 ng/mL (ref 30–100)

## 2023-01-02 MED ORDER — ACETAMINOPHEN 325 MG PO TABS
650.0000 mg | ORAL_TABLET | Freq: Once | ORAL | Status: AC
  Administered 2023-01-02: 650 mg via ORAL
  Filled 2023-01-02: qty 2

## 2023-01-02 MED ORDER — DIPHENHYDRAMINE HCL 25 MG PO CAPS
50.0000 mg | ORAL_CAPSULE | Freq: Once | ORAL | Status: AC
  Administered 2023-01-02: 50 mg via ORAL
  Filled 2023-01-02: qty 2

## 2023-01-02 MED ORDER — TRASTUZUMAB-ANNS CHEMO 150 MG IV SOLR
6.0000 mg/kg | Freq: Once | INTRAVENOUS | Status: AC
  Administered 2023-01-02: 300 mg via INTRAVENOUS
  Filled 2023-01-02: qty 14.29

## 2023-01-02 MED ORDER — HEPARIN SOD (PORK) LOCK FLUSH 100 UNIT/ML IV SOLN
500.0000 [IU] | Freq: Once | INTRAVENOUS | Status: AC | PRN
  Administered 2023-01-02: 500 [IU]
  Filled 2023-01-02: qty 5

## 2023-01-02 MED ORDER — SODIUM CHLORIDE 0.9% FLUSH
10.0000 mL | INTRAVENOUS | Status: DC | PRN
  Administered 2023-01-02: 10 mL
  Filled 2023-01-02: qty 10

## 2023-01-02 MED ORDER — SODIUM CHLORIDE 0.9 % IV SOLN
Freq: Once | INTRAVENOUS | Status: AC
  Filled 2023-01-02: qty 250

## 2023-01-02 NOTE — Progress Notes (Signed)
Patient wants the doctor to look  at a piece of string that is sticking out of her skin near her port.

## 2023-01-02 NOTE — Progress Notes (Signed)
[facility] CONSULT NOTE  Patient Care Team: Lorre Munroe, NP as PCP - General (Internal Medicine) Hulen Luster, RN as Oncology Nurse Navigator Michaelyn Barter, MD as Consulting Physician (Oncology) Carmina Miller, MD as Consulting Physician (Radiation Oncology)   CANCER STAGING   Cancer Staging  Breast cancer [facility]) Staging form: Breast, AJCC 8th Edition - Clinical stage from 05/17/2022: Stage IA (cT1b, cN0, cM0, G3, ER-, PR-, HER2+) - Signed by Michaelyn Barter, MD on 05/27/2022 Stage prefix: Initial diagnosis Histologic grading system: 3 grade system  Current treatment Weekly paclitaxel with Herceptin (APT regimen) started on 07/18/2022  ASSESSMENT & PLAN:  Jennifer Garrison 62 y.o. female with pmh of osteoporosis and anxiety was referred to medical oncology for management of left breast stage IA invasive ductal cancer.  # Left breast IDC stage IA, ER/PR negative, HER2 positive # Encounter for cardiac monitoring -Patient felt a palpable mass in her left breast.   -S/p left breast lumpectomy with SLNB by Dr. Tonna Boehringer on 06/09/2022. Pathology showed 1.4 cm IDC, grade 3, DCIS present grade 2-3 with comedonecrosis, 0/3 lymph nodes negative, margins negative, pT1c.  ER/PR negative.  HER2 positive  -Patient reports small pea-sized around the nipple area in the left breast.  Diagnostic mammogram and ultrasound showed degrading fibroadenoma.  No concerns for malignancy.  Will repeat mammogram in 1 year due in July 2025.  -Completed weekly Taxol x 12 on 10/03/2022. Now on Herceptin every 3 weeks for total of 1 year.   -Getting adjuvant RT.  Completion date on 11/22/2022. -Labs reviewed and acceptable for treatment.  Will proceed with Herceptin 6 mg/kg every 3 weeks.  Will do labs with every other cycle.  She will come in in 3 weeks for infusion only.  And then in 6 weeks for MD visit and infusion. -Last echo from July 30 8 which showed EF of 55 to 60%.  Will schedule next echo in mid  November. -Last mammogram from 10/05/2022 was negative.  Repeat in 1 year.  # Nail changes -Likely secondary to chemotherapy.  Previously treated with doxycycline. -Improving.  # Anxiety -Worsened with the need of chemotherapy. -Continue with Ativan 0.5 mg every 8 hours as needed.  On Wellbutrin.  # Evaluated by genetics-testing negative  # Access-port.  Placed by Dr. Tonna Boehringer on 06/30/2022. -Reports concerned that there is a stitch coming out of her skin around the port site.  Message sent to Dr. Tonna Boehringer.  # Osteoporosis -On alendronate -Add vitamin D per patient's request.  No orders of the defined types were placed in this encounter.  RTC in 3 weeks for Herceptin only RTC in 6 weeks for MD visit, labs, Herceptin  The total time spent in the appointment was 30 minutes encounter with patients including review of chart and various tests results, discussions about plan of care and coordination of care plan   All questions were answered. The patient knows to call the clinic with any problems, questions or concerns. No barriers to learning was detected.  Michaelyn Barter, MD 10/7/202410:16 AM   HISTORY OF PRESENTING ILLNESS:  Jennifer Garrison 62 y.o. female with pmh of osteoporosis on alendronate and anxiety was referred to medical oncology for further management of stage Ia left breast invasive ductal cancer HER2 positive  Patient felt a palpable mass in her left breast mid February 2024.  She sought medical attention from PCP.  Further imaging as below.  INTERVAL HISTORY- Patient seen today as follow-up for her maintenance Herceptin. She is doing  well overall.  Recently had a trip.  Her main concern today is a stitch around the port site which is protruding out of her skin.  Gets caught on clothes.  At times painful.  Denies any shortness of breath or chest pain.   I have reviewed her chart and materials related to her cancer extensively and collaborated history with the patient. Summary  of oncologic history is as follows: Oncology History  Breast cancer (HCC)  05/11/2022 Mammogram   Diagnostic mammogram and ultrasound- FINDINGS: Full field and spot compression views of the LEFT breast demonstrate a 1 cm irregular mass within the UPPER INNER LEFT breast, at the site of patient concern. The patient has retropectoral implants.   On physical exam, a firm palpable mass identified at the 11 o'clock position of the LEFT breast 8 cm from the nipple.   Targeted ultrasound is performed, showing a 0.9 x 0.8 x 1 cm irregular hypoechoic mass at the 11 o'clock position of the LEFT breast 8 cm from the nipple.   No abnormal LEFT axillary lymph nodes are noted.   IMPRESSION: 1. Suspicious 1 cm UPPER INNER LEFT breast mass. Tissue sampling is recommended. 2. No abnormal appearing LEFT axillary lymph nodes.   05/17/2022 Cancer Staging   Staging form: Breast, AJCC 8th Edition - Clinical stage from 05/17/2022: Stage IA (cT1b, cN0, cM0, G3, ER-, PR-, HER2+) - Signed by Michaelyn Barter, MD on 05/27/2022 Stage prefix: Initial diagnosis Histologic grading system: 3 grade system   05/17/2022 Initial Biopsy   DIAGNOSIS:  A. BREAST, LEFT, 11:00, 8 CM FROM NIPPLE, ULTRASOUND-GUIDED BIOPSY:   - INVASIVE MAMMARY CARCINOMA, NOS DUCTAL.   Size of invasive carcinoma: 10 mm in this sample  Histologic grade of invasive carcinoma: Grade 3                       Glandular/tubular differentiation score: 3                       Nuclear pleomorphism score: 3                       Mitotic rate score: 2                       Total score: 8  Ductal carcinoma in situ: Not identified  Lymphovascular invasion: Not identified   Estrogen Receptor (ER) Status: NEGATIVE (LESS THAN 1%)   Progesterone Receptor (PgR) Status: NEGATIVE (LESS THAN 1%)   HER2 (by immunohistochemistry): POSITIVE (Score 3+)       Percentage of cells with uniform intense complete membrane  staining: 80%  Ki-67: Not performed      Genetic Testing   No pathogenic variants identified on the [facility] Multi-Cancer+RNA panel. VUS in MUTYH called c.1484G>A identified. The report date is 06/09/2022.  The Multi-Cancer + RNA Panel offered by [facility] includes sequencing and/or deletion/duplication analysis of the following 70 genes:  AIP*, ALK, APC*, ATM*, AXIN2*, BAP1*, BARD1*, BLM*, BMPR1A*, BRCA1*, BRCA2*, BRIP1*, CDC73*, CDH1*, CDK4, CDKN1B*, CDKN2A, CHEK2*, CTNNA1*, DICER1*, EPCAM, EGFR, FH*, FLCN*, GREM1, HOXB13, KIT, LZTR1, MAX*, MBD4, MEN1*, MET, MITF, MLH1*, MSH2*, MSH3*, MSH6*, MUTYH*, NF1*, NF2*, NTHL1*, PALB2*, PDGFRA, PMS2*, POLD1*, POLE*, POT1*, PRKAR1A*, PTCH1*, PTEN*, RAD51C*, RAD51D*, RB1*, RET, SDHA*, SDHAF2*, SDHB*, SDHC*, SDHD*, SMAD4*, SMARCA4*, SMARCB1*, SMARCE1*, STK11*, SUFU*, TMEM127*, TP53*, TSC1*, TSC2*, VHL*. RNA analysis is performed for * genes.   06/09/2022 Surgery   S/p left breast  lumpectomy with SLNB by Dr. Tonna Boehringer  Pathology showed 1.4 cm IDC, grade 3, DCIS present grade 2-3 with comedonecrosis, 0/3 lymph nodes negative, margins negative, pT1c.  ER/PR negative.  HER2 positive   07/18/2022 -  Chemotherapy   Patient is on Treatment Plan : BREAST Paclitaxel + Trastuzumab q7d / Trastuzumab q21d      Menarche age 67 or 44 Age at first birth 71 Used birth control pills and Depo more than 5 years Menopause was age 59 Hysterectomy yes for prolapsed uterus HRT no History of breast biopsies yes for breast cyst in left lower Family history of lung cancer in mother who was smoker  MEDICAL HISTORY:  Past Medical History:  Diagnosis Date   Allergy    Anxiety    Arthritis    Breast cancer (HCC) 05/27/2022   IBS (irritable bowel syndrome)    Trigger finger    Left Middle Finger 11/2016    SURGICAL HISTORY: Past Surgical History:  Procedure Laterality Date   ABDOMINAL HYSTERECTOMY  02/2016   Only cervix remains (done for prolapse)   AUGMENTATION MAMMAPLASTY Bilateral 2010   BREAST BIOPSY Left  05/17/2022   [place] BX, ribbon clip, path pending   BREAST BIOPSY Left 05/17/2022   [place] LT BREAST BX W LOC DEV 1ST LESION IMG BX SPEC US GUIDE 05/17/2022 [facility]   BREAST CYST ASPIRATION Left 2010   neg   BREAST ENHANCEMENT SURGERY  1998   CHOLECYSTECTOMY     COLONOSCOPY WITH PROPOFOL N/A 10/10/2019   Procedure: COLONOSCOPY WITH PROPOFOL;  Surgeon: Wyline Mood, MD;  Location: [facility];  Service: Gastroenterology;  Laterality: N/A;   PARTIAL MASTECTOMY WITH AXILLARY SENTINEL LYMPH NODE BIOPSY Left 06/09/2022   Procedure: PARTIAL MASTECTOMY WITH AXILLARY SENTINEL LYMPH NODE BIOPSY;  Surgeon: Sung Amabile, DO;  Location: [facility];  Service: General;  Laterality: Left;   PORTACATH PLACEMENT N/A 06/30/2022   Procedure: INSERTION PORT-A-CATH;  Surgeon: Sung Amabile, DO;  Location: [facility];  Service: General;  Laterality: N/A;   TYMPANOPLASTY Left     SOCIAL HISTORY: Social History   Socioeconomic History   Marital status: Married    Spouse name: Onalee Hua   Number of children: 1   Years of education: Not on file   Highest education level: Not on file  Occupational History   Not on file  Tobacco Use   Smoking status: Former    Current packs/day: 0.00    Types: Cigarettes    Quit date: 09/09/1992    Years since quitting: 30.3   Smokeless tobacco: Current    Types: Chew   Tobacco comments:    Chewing tobacco   Vaping Use   Vaping status: Former  Substance and Sexual Activity   Alcohol use: Not Currently   Drug use: Yes    Types: Marijuana    Comment: daily   Sexual activity: Yes    Birth control/protection: None  Other Topics Concern   Not on file  Social History Narrative   Not on file   Social Determinants of Health   Financial Resource Strain: Not on file  Food Insecurity: No Food Insecurity (05/27/2022)   Hunger Vital Sign    Worried About Running Out of Food in the Last Year: Never true    Ran Out of Food in the Last Year: Never true  Transportation Needs: No  Transportation Needs (05/27/2022)   PRAPARE - Administrator, Civil Service (Medical): No    Lack of Transportation (Non-Medical): No  Physical  Activity: Not on file  Stress: Not on file  Social Connections: Not on file  Intimate Partner Violence: Not At Risk (05/27/2022)   Humiliation, Afraid, Rape, and Kick questionnaire    Fear of Current or Ex-Partner: No    Emotionally Abused: No    Physically Abused: No    Sexually Abused: No    FAMILY HISTORY: Family History  Problem Relation Age of Onset   Lung cancer Mother 33   Hypertension Mother    Lupus Father    Leukemia Cousin 6   Breast cancer Neg Hx     ALLERGIES:  is allergic to poison ivy extract.  MEDICATIONS:  Current Outpatient Medications  Medication Sig Dispense Refill   ADVANCED FIBER COMPLEX PO Take 3 tablets by mouth daily. Gummies     alendronate (FOSAMAX) 70 MG tablet Take 70 mg by mouth once a week.     buPROPion (WELLBUTRIN SR) 150 MG 12 hr tablet TAKE 1 TABLET TWICE A DAY 180 tablet 1   busPIRone (BUSPAR) 5 MG tablet Take 1 tablet (5 mg total) by mouth 2 (two) times daily as needed. 180 tablet 1   ibuprofen (ADVIL) 600 MG tablet Take 1 tablet (600 mg total) by mouth every 6 (six) hours as needed. 30 tablet 0   lidocaine-prilocaine (EMLA) cream Apply to affected area once 30 g 3   LORazepam (ATIVAN) 0.5 MG tablet Take 1 tablet (0.5 mg total) by mouth every 8 (eight) hours. 30 tablet 0   magic mouthwash (multi-ingredient) oral suspension Take 5 mLs by mouth 4 (four) times daily as needed. 300 mL 0   metroNIDAZOLE (METROCREAM) 0.75 % cream Apply topically as needed.     mupirocin cream (BACTROBAN) 2 % Apply 1 Application topically 2 (two) times daily. Apply to skin of nails twice a day for chemotherapy nail changes. 30 g 0   ondansetron (ZOFRAN) 8 MG tablet Take 1 tablet (8 mg total) by mouth every 8 (eight) hours as needed for nausea or vomiting. 30 tablet 1   OVER THE COUNTER MEDICATION 1.5 tablets in the  morning and at bedtime. Calcium Bone Strength Supplement1     OVER THE COUNTER MEDICATION 2 tablets daily. Vitex Berry     OVER THE COUNTER MEDICATION in the morning, at noon, and at bedtime. Milk Thisle Seeds Tea     prochlorperazine (COMPAZINE) 10 MG tablet Take 1 tablet (10 mg total) by mouth every 6 (six) hours as needed for nausea or vomiting. 30 tablet 1   triamcinolone cream (KENALOG) 0.1 % Apply 1 Application topically 2 (two) times daily. Apply to nail plates twice a day for nail lifting due to chemotherapy. 28 g 0   dicyclomine (BENTYL) 10 MG capsule Take 1 capsule (10 mg total) by mouth 3 (three) times daily as needed for up to 10 days for spasms. 30 capsule 0   HYDROcodone-acetaminophen (NORCO) 5-325 MG tablet Take 1 tablet by mouth every 6 (six) hours as needed for up to 6 doses for moderate pain. (Patient not taking: Reported on 01/02/2023) 6 tablet 0   No current facility-administered medications for this visit.   Facility-Administered Medications Ordered in Other Visits  Medication Dose Route Frequency Provider Last Rate Last Admin   0.9 %  sodium chloride infusion   Intravenous Once Michaelyn Barter, MD       heparin lock flush 100 unit/mL  500 Units Intracatheter Once PRN Michaelyn Barter, MD       sodium chloride flush (NS) 0.9 %  injection 10 mL  10 mL Intracatheter PRN Michaelyn Barter, MD       trastuzumab-anns [facility]) 300 mg in sodium chloride 0.9 % 250 mL chemo infusion  6 mg/kg (Treatment Plan Recorded) Intravenous Once Michaelyn Barter, MD        REVIEW OF SYSTEMS:   Pertinent information mentioned in HPI All other systems were reviewed with the patient and are negative.  PHYSICAL EXAMINATION: ECOG PERFORMANCE STATUS: 0 - Asymptomatic  Vitals:   01/02/23 0849  BP: (!) 99/55  Pulse: 67  Temp: (!) 97.2 F (36.2 C)  SpO2: 99%        Filed Weights   01/02/23 0849  Weight: 118 lb (53.5 kg)        GENERAL:alert, no distress and comfortable SKIN: skin  color, texture, turgor are normal, no rashes or significant lesions EYES: normal, conjunctiva are pink and non-injected, sclera clear OROPHARYNX:no exudate, no erythema and lips, buccal mucosa, and tongue normal  NECK: supple, thyroid normal size, non-tender, without nodularity LYMPH:  no palpable lymphadenopathy in the cervical, axillary or inguinal LUNGS: clear to auscultation and percussion with normal breathing effort HEART: regular rate & rhythm and no murmurs and no lower extremity edema ABDOMEN:abdomen soft, non-tender and normal bowel sounds Musculoskeletal:no cyanosis of digits and no clubbing  PSYCH: alert & oriented x 3 with fluent speech NEURO: no focal motor/sensory deficits  LABORATORY DATA:  I have reviewed the data as listed Lab Results  Component Value Date   WBC 7.8 01/02/2023   HGB 12.9 01/02/2023   HCT 39.9 01/02/2023   MCV 90.7 01/02/2023   PLT 236 01/02/2023   Recent Labs    10/31/22 1044 11/21/22 0918 12/12/22 0917 01/02/23 0832  NA 137 138 138 137  K 3.9 4.2 3.4* 3.7  CL 107 108 109 108  CO2 23 25 21* 23  GLUCOSE 114* 102* 123* 134*  BUN 20 21 19 18   CREATININE 0.78 0.79 0.87 0.90  CALCIUM 9.0 8.9 8.3* 8.3*  GFRNONAA >60 >60 >60 >60  PROT 6.9 6.5  --  6.8  ALBUMIN 3.9 3.8  --  3.8  AST 17 18  --  15  ALT 13 16  --  13  ALKPHOS 52 53  --  46  BILITOT 0.8 0.6  --  0.6    RADIOGRAPHIC STUDIES: I have personally reviewed the radiological images as listed and agreed with the findings in the report. No results found.

## 2023-01-02 NOTE — Patient Instructions (Signed)
[place] [facility]  Discharge Instructions: Thank you for choosing [facility] to provide your oncology and hematology care.  If you have a lab appointment with the Cancer Center, please go directly to the Cancer Center and check in at the registration area.  Wear comfortable clothing and clothing appropriate for easy access to any Portacath or PICC line.   We strive to give you quality time with your provider. You may need to reschedule your appointment if you arrive late (15 or more minutes).  Arriving late affects you and other patients whose appointments are after yours.  Also, if you miss three or more appointments without notifying the office, you may be dismissed from the clinic at the provider's discretion.      For prescription refill requests, have your pharmacy contact our office and allow 72 hours for refills to be completed.    Today you received the following chemotherapy and/or immunotherapy agents herceptin    To help prevent nausea and vomiting after your treatment, we encourage you to take your nausea medication as directed.  BELOW ARE SYMPTOMS THAT SHOULD BE REPORTED IMMEDIATELY: *FEVER GREATER THAN 100.4 F (38 C) OR HIGHER *CHILLS OR SWEATING *NAUSEA AND VOMITING THAT IS NOT CONTROLLED WITH YOUR NAUSEA MEDICATION *UNUSUAL SHORTNESS OF BREATH *UNUSUAL BRUISING OR BLEEDING *URINARY PROBLEMS (pain or burning when urinating, or frequent urination) *BOWEL PROBLEMS (unusual diarrhea, constipation, pain near the anus) TENDERNESS IN MOUTH AND THROAT WITH OR WITHOUT PRESENCE OF ULCERS (sore throat, sores in mouth, or a toothache) UNUSUAL RASH, SWELLING OR PAIN  UNUSUAL VAGINAL DISCHARGE OR ITCHING   Items with * indicate a potential emergency and should be followed up as soon as possible or go to the Emergency Department if any problems should occur.  Please show the CHEMOTHERAPY ALERT CARD or IMMUNOTHERAPY ALERT CARD at check-in to  the Emergency Department and triage nurse.  Should you have questions after your visit or need to cancel or reschedule your appointment, please contact [place] [facility]  [phone] and follow the prompts.  Office hours are 8:00 a.m. to 4:30 p.m. Monday - Friday. Please note that voicemails left after 4:00 p.m. may not be returned until the following business day.  We are closed weekends and major holidays. You have access to a nurse at all times for urgent questions. Please call the main number to the clinic [phone] and follow the prompts.  For any non-urgent questions, you may also contact your provider using MyChart. We now offer e-Visits for anyone 5 and older to request care online for non-urgent symptoms. For details visit [email or web address].   Also download the MyChart app! Go to the app store, search "MyChart", open the app, select [facility], and log in with your MyChart username and password.

## 2023-01-03 ENCOUNTER — Other Ambulatory Visit: Payer: Self-pay

## 2023-01-05 ENCOUNTER — Encounter: Payer: Self-pay | Admitting: Radiation Oncology

## 2023-01-05 ENCOUNTER — Ambulatory Visit
Admission: RE | Admit: 2023-01-05 | Discharge: 2023-01-05 | Disposition: A | Discharge status: Home / Self Care | Payer: 59 | Source: Ambulatory Visit | Attending: Radiation Oncology | Admitting: Radiation Oncology

## 2023-01-05 VITALS — BP 90/61 | HR 70 | Temp 97.2°F | Wt 116.9 lb

## 2023-01-05 DIAGNOSIS — Z923 Personal history of irradiation: Secondary | ICD-10-CM | POA: Diagnosis not present

## 2023-01-05 DIAGNOSIS — C50912 Malignant neoplasm of unspecified site of left female breast: Secondary | ICD-10-CM | POA: Insufficient documentation

## 2023-01-05 DIAGNOSIS — Z170 Estrogen receptor positive status [ER+]: Secondary | ICD-10-CM | POA: Diagnosis not present

## 2023-01-05 NOTE — Progress Notes (Signed)
Radiation Oncology Follow up Note  Name: Jennifer Garrison   Date:   01/05/2023 MRN:  [id number] DOB: 06/17/1960    This 62 y.o. female presents to the clinic today for 1 month follow-up status post radiation therapy to her left breast for stage Ia (pT1 cN0 M0) grade 3 ER/PR negative HER2/neu overexpressed invasive mammary carcinoma.  REFERRING PROVIDER: Lorre Munroe, NP  HPI: Patient is a 62 year old female now out 1 month having completed whole breast radiation to her left breast for stage Ia ER/PR negative HER2/neu overexpressed invasive mammary carcinoma.  Seen today in routine follow-up she is doing fairly well she has 1 small area of a nevus which has become slightly hyperpigmented.  Otherwise without complaint.  She has large breast implants.  She is currently under Herceptin treatment..  COMPLICATIONS OF TREATMENT: none  FOLLOW UP COMPLIANCE: keeps appointments   PHYSICAL EXAM:  BP 90/61 (BP Location: Right Arm, Patient Position: Sitting, Cuff Size: Normal)   Pulse 70   Temp (!) 97.2 F (36.2 C) (Tympanic)   Wt 116 lb 14.4 oz (53 kg)   BMI 20.07 kg/m  Patient has bilateral breast implants.  No dominant masses noted in either breast.  No axillary or supraclavicular adenopathy is identified.  She is a protruding up port placed in the right anterior chest wall.  Well-developed well-nourished patient in NAD. HEENT reveals PERLA, EOMI, discs not visualized.  Oral cavity is clear. No oral mucosal lesions are identified. Neck is clear without evidence of cervical or supraclavicular adenopathy. Lungs are clear to A&P. Cardiac examination is essentially unremarkable with regular rate and rhythm without murmur rub or thrill. Abdomen is benign with no organomegaly or masses noted. Motor sensory and DTR levels are equal and symmetric in the upper and lower extremities. Cranial nerves II through XII are grossly intact. Proprioception is intact. No peripheral adenopathy or edema is identified. No  motor or sensory levels are noted. Crude visual fields are within normal range.  RADIOLOGY RESULTS: No current films for review  PLAN: Present time patient is doing well very low side effect profile from whole breast radiation.  And pleased with her overall progress.  She continues with medical oncology under treatment.  I do asked to see her back in 6 months for follow-up.  Patient knows to call with any concerns.  I would like to take this opportunity to thank you for allowing me to participate in the care of your patient.Jennifer Miller, MD

## 2023-01-07 ENCOUNTER — Other Ambulatory Visit: Payer: Self-pay

## 2023-01-23 ENCOUNTER — Inpatient Hospital Stay: Payer: 59

## 2023-01-23 VITALS — BP 111/58 | HR 61 | Temp 97.7°F | Resp 16 | Wt 117.7 lb

## 2023-01-23 DIAGNOSIS — Z5112 Encounter for antineoplastic immunotherapy: Secondary | ICD-10-CM | POA: Diagnosis not present

## 2023-01-23 DIAGNOSIS — C50912 Malignant neoplasm of unspecified site of left female breast: Secondary | ICD-10-CM

## 2023-01-23 MED ORDER — SODIUM CHLORIDE 0.9% FLUSH
10.0000 mL | INTRAVENOUS | Status: DC | PRN
  Administered 2023-01-23: 10 mL
  Filled 2023-01-23: qty 10

## 2023-01-23 MED ORDER — TRASTUZUMAB-ANNS CHEMO 150 MG IV SOLR
6.0000 mg/kg | Freq: Once | INTRAVENOUS | Status: AC
  Administered 2023-01-23: 300 mg via INTRAVENOUS
  Filled 2023-01-23: qty 14.29

## 2023-01-23 MED ORDER — ACETAMINOPHEN 325 MG PO TABS
650.0000 mg | ORAL_TABLET | Freq: Once | ORAL | Status: AC
  Administered 2023-01-23: 650 mg via ORAL
  Filled 2023-01-23: qty 2

## 2023-01-23 MED ORDER — SODIUM CHLORIDE 0.9 % IV SOLN
Freq: Once | INTRAVENOUS | Status: AC
  Filled 2023-01-23: qty 250

## 2023-01-23 MED ORDER — HEPARIN SOD (PORK) LOCK FLUSH 100 UNIT/ML IV SOLN
500.0000 [IU] | Freq: Once | INTRAVENOUS | Status: AC | PRN
  Administered 2023-01-23: 500 [IU]
  Filled 2023-01-23: qty 5

## 2023-01-23 MED ORDER — DIPHENHYDRAMINE HCL 25 MG PO CAPS
50.0000 mg | ORAL_CAPSULE | Freq: Once | ORAL | Status: AC
  Administered 2023-01-23: 50 mg via ORAL
  Filled 2023-01-23: qty 2

## 2023-01-23 NOTE — Patient Instructions (Signed)
[place] [facility]  Discharge Instructions: Thank you for choosing [facility] to provide your oncology and hematology care.  If you have a lab appointment with the Cancer Center, please go directly to the Cancer Center and check in at the registration area.  Wear comfortable clothing and clothing appropriate for easy access to any Portacath or PICC line.   We strive to give you quality time with your provider. You may need to reschedule your appointment if you arrive late (15 or more minutes).  Arriving late affects you and other patients whose appointments are after yours.  Also, if you miss three or more appointments without notifying the office, you may be dismissed from the clinic at the provider's discretion.      For prescription refill requests, have your pharmacy contact our office and allow 72 hours for refills to be completed.    Today you received the following chemotherapy and/or immunotherapy agents Kanjinti      To help prevent nausea and vomiting after your treatment, we encourage you to take your nausea medication as directed.  BELOW ARE SYMPTOMS THAT SHOULD BE REPORTED IMMEDIATELY: *FEVER GREATER THAN 100.4 F (38 C) OR HIGHER *CHILLS OR SWEATING *NAUSEA AND VOMITING THAT IS NOT CONTROLLED WITH YOUR NAUSEA MEDICATION *UNUSUAL SHORTNESS OF BREATH *UNUSUAL BRUISING OR BLEEDING *URINARY PROBLEMS (pain or burning when urinating, or frequent urination) *BOWEL PROBLEMS (unusual diarrhea, constipation, pain near the anus) TENDERNESS IN MOUTH AND THROAT WITH OR WITHOUT PRESENCE OF ULCERS (sore throat, sores in mouth, or a toothache) UNUSUAL RASH, SWELLING OR PAIN  UNUSUAL VAGINAL DISCHARGE OR ITCHING   Items with * indicate a potential emergency and should be followed up as soon as possible or go to the Emergency Department if any problems should occur.  Please show the CHEMOTHERAPY ALERT CARD or IMMUNOTHERAPY ALERT CARD at check-in to  the Emergency Department and triage nurse.  Should you have questions after your visit or need to cancel or reschedule your appointment, please contact [place] [facility]  [phone] and follow the prompts.  Office hours are 8:00 a.m. to 4:30 p.m. Monday - Friday. Please note that voicemails left after 4:00 p.m. may not be returned until the following business day.  We are closed weekends and major holidays. You have access to a nurse at all times for urgent questions. Please call the main number to the clinic [phone] and follow the prompts.  For any non-urgent questions, you may also contact your provider using MyChart. We now offer e-Visits for anyone 76 and older to request care online for non-urgent symptoms. For details visit [email or web address].   Also download the MyChart app! Go to the app store, search "MyChart", open the app, select [place], and log in with your MyChart username and password.

## 2023-02-07 ENCOUNTER — Encounter: Payer: Self-pay | Admitting: Internal Medicine

## 2023-02-10 ENCOUNTER — Ambulatory Visit
Admission: RE | Admit: 2023-02-10 | Discharge: 2023-02-10 | Disposition: A | Discharge status: Home / Self Care | Payer: 59 | Source: Ambulatory Visit | Attending: Internal Medicine | Admitting: Internal Medicine

## 2023-02-10 ENCOUNTER — Other Ambulatory Visit: Payer: Self-pay | Admitting: *Deleted

## 2023-02-10 DIAGNOSIS — C50912 Malignant neoplasm of unspecified site of left female breast: Secondary | ICD-10-CM | POA: Insufficient documentation

## 2023-02-10 DIAGNOSIS — Z171 Estrogen receptor negative status [ER-]: Secondary | ICD-10-CM | POA: Diagnosis not present

## 2023-02-10 DIAGNOSIS — I351 Nonrheumatic aortic (valve) insufficiency: Secondary | ICD-10-CM | POA: Insufficient documentation

## 2023-02-10 DIAGNOSIS — Z5112 Encounter for antineoplastic immunotherapy: Secondary | ICD-10-CM | POA: Insufficient documentation

## 2023-02-10 LAB — ECHOCARDIOGRAM COMPLETE
AR max vel: 2.67 cm2
AV Area VTI: 2.65 cm2
AV Area mean vel: 2.61 cm2
AV Mean grad: 2 mm[Hg]
AV Peak grad: 3.6 mm[Hg]
Ao pk vel: 0.95 m/s
Area-P 1/2: 2.79 cm2
S' Lateral: 3.1 cm

## 2023-02-10 NOTE — Progress Notes (Signed)
*  PRELIMINARY RESULTS* Echocardiogram 2D Echocardiogram has been performed.  Cristela Blue 02/10/2023, 11:13 AM

## 2023-02-13 ENCOUNTER — Inpatient Hospital Stay: Payer: 59

## 2023-02-13 ENCOUNTER — Inpatient Hospital Stay: Payer: 59 | Attending: Internal Medicine | Admitting: Internal Medicine

## 2023-02-13 VITALS — BP 110/62 | HR 65 | Temp 97.2°F | Wt 114.0 lb

## 2023-02-13 DIAGNOSIS — Z171 Estrogen receptor negative status [ER-]: Secondary | ICD-10-CM | POA: Diagnosis not present

## 2023-02-13 DIAGNOSIS — F419 Anxiety disorder, unspecified: Secondary | ICD-10-CM | POA: Insufficient documentation

## 2023-02-13 DIAGNOSIS — Z5111 Encounter for antineoplastic chemotherapy: Secondary | ICD-10-CM

## 2023-02-13 DIAGNOSIS — C50912 Malignant neoplasm of unspecified site of left female breast: Secondary | ICD-10-CM

## 2023-02-13 DIAGNOSIS — Z79899 Other long term (current) drug therapy: Secondary | ICD-10-CM | POA: Diagnosis not present

## 2023-02-13 DIAGNOSIS — Z923 Personal history of irradiation: Secondary | ICD-10-CM | POA: Diagnosis not present

## 2023-02-13 DIAGNOSIS — C50012 Malignant neoplasm of nipple and areola, left female breast: Secondary | ICD-10-CM | POA: Diagnosis present

## 2023-02-13 DIAGNOSIS — Z5112 Encounter for antineoplastic immunotherapy: Secondary | ICD-10-CM

## 2023-02-13 DIAGNOSIS — D242 Benign neoplasm of left breast: Secondary | ICD-10-CM | POA: Insufficient documentation

## 2023-02-13 DIAGNOSIS — I427 Cardiomyopathy due to drug and external agent: Secondary | ICD-10-CM

## 2023-02-13 DIAGNOSIS — I5189 Other ill-defined heart diseases: Secondary | ICD-10-CM | POA: Insufficient documentation

## 2023-02-13 DIAGNOSIS — Z9221 Personal history of antineoplastic chemotherapy: Secondary | ICD-10-CM | POA: Diagnosis not present

## 2023-02-13 DIAGNOSIS — T451X5A Adverse effect of antineoplastic and immunosuppressive drugs, initial encounter: Secondary | ICD-10-CM | POA: Insufficient documentation

## 2023-02-13 DIAGNOSIS — M810 Age-related osteoporosis without current pathological fracture: Secondary | ICD-10-CM | POA: Insufficient documentation

## 2023-02-13 LAB — CBC WITH DIFFERENTIAL (CANCER CENTER ONLY)
Abs Immature Granulocytes: 0.03 10*3/uL (ref 0.00–0.07)
Basophils Absolute: 0.1 10*3/uL (ref 0.0–0.1)
Basophils Relative: 1 %
Eosinophils Absolute: 0.3 10*3/uL (ref 0.0–0.5)
Eosinophils Relative: 4 %
HCT: 42.4 % (ref 36.0–46.0)
Hemoglobin: 13.8 g/dL (ref 12.0–15.0)
Immature Granulocytes: 0 %
Lymphocytes Relative: 16 %
Lymphs Abs: 1.1 10*3/uL (ref 0.7–4.0)
MCH: 28.2 pg (ref 26.0–34.0)
MCHC: 32.5 g/dL (ref 30.0–36.0)
MCV: 86.7 fL (ref 80.0–100.0)
Monocytes Absolute: 0.5 10*3/uL (ref 0.1–1.0)
Monocytes Relative: 7 %
Neutro Abs: 4.9 10*3/uL (ref 1.7–7.7)
Neutrophils Relative %: 72 %
Platelet Count: 247 10*3/uL (ref 150–400)
RBC: 4.89 MIL/uL (ref 3.87–5.11)
RDW: 12.7 % (ref 11.5–15.5)
WBC Count: 6.9 10*3/uL (ref 4.0–10.5)
nRBC: 0 % (ref 0.0–0.2)

## 2023-02-13 LAB — CMP (CANCER CENTER ONLY)
ALT: 16 U/L (ref 0–44)
AST: 19 U/L (ref 15–41)
Albumin: 4.1 g/dL (ref 3.5–5.0)
Alkaline Phosphatase: 50 U/L (ref 38–126)
Anion gap: 9 (ref 5–15)
BUN: 20 mg/dL (ref 8–23)
CO2: 26 mmol/L (ref 22–32)
Calcium: 9 mg/dL (ref 8.9–10.3)
Chloride: 104 mmol/L (ref 98–111)
Creatinine: 0.9 mg/dL (ref 0.44–1.00)
GFR, Estimated: >60 mL/min (ref >60)
Glucose, Bld: 120 mg/dL — ABNORMAL HIGH (ref 70–99)
Potassium: 4 mmol/L (ref 3.5–5.1)
Sodium: 139 mmol/L (ref 135–145)
Total Bilirubin: 0.7 mg/dL (ref <1.2)
Total Protein: 7.1 g/dL (ref 6.5–8.1)

## 2023-02-13 MED ORDER — HEPARIN SOD (PORK) LOCK FLUSH 100 UNIT/ML IV SOLN
500.0000 [IU] | Freq: Once | INTRAVENOUS | Status: AC
  Administered 2023-02-13: 500 [IU] via INTRAVENOUS
  Filled 2023-02-13: qty 5

## 2023-02-13 NOTE — Progress Notes (Signed)
Patient is feeling dizzy started yesterday, and she wanted to see if you could look inside her ear.

## 2023-02-13 NOTE — Progress Notes (Addendum)
[facility] CONSULT NOTE  Patient Care Team: Lorre Munroe, NP as PCP - General (Internal Medicine) Hulen Luster, RN as Oncology Nurse Navigator Michaelyn Barter, MD as Consulting Physician (Oncology) Carmina Miller, MD as Consulting Physician (Radiation Oncology)   CANCER STAGING   Cancer Staging  Breast cancer [facility]) Staging form: Breast, AJCC 8th Edition - Clinical stage from 05/17/2022: Stage IA (cT1b, cN0, cM0, G3, ER-, PR-, HER2+) - Signed by Michaelyn Barter, MD on 05/27/2022 Stage prefix: Initial diagnosis Histologic grading system: 3 grade system  Current treatment Weekly paclitaxel with Herceptin (APT regimen) started on 07/18/2022  ASSESSMENT & PLAN:  Jennifer Garrison 62 y.o. female with pmh of osteoporosis and anxiety was referred to medical oncology for management of left breast stage IA invasive ductal cancer.  # Left breast IDC stage IA, ER/PR negative, HER2 positive # Encounter for cardiac monitoring -Patient felt a palpable mass in her left breast.   -S/p left breast lumpectomy with SLNB by Dr. Tonna Boehringer on 06/09/2022. Pathology showed 1.4 cm IDC, grade 3, DCIS present grade 2-3 with comedonecrosis, 0/3 lymph nodes negative, margins negative, pT1c.  ER/PR negative.  HER2 positive  -Completed weekly Taxol and Heceptin x 12 on 10/03/2022. Now on Herceptin every 3 weeks for total of 1 year.   -Adjuvant radiation completed on 11/22/2022.  -Last mammogram from 10/05/2022 was negative.  Repeat in 1 year.  -Patient has been on Herceptin maintenance.  Echo from 02/10/2023 showed decrease in the ejection fraction from 55 to 60% to 45 to 50%.  The left ventricle demonstrated global hypokinesis.  Grade 1 diastolic Dysfunction.  Discussed with the patient about Herceptin induced cardiotoxicity which is usually reversible.  Will hold treatment today.  Urgent referral to cardiology for consideration of starting on heart management medications.  Will repeat echo in 4 weeks.  Patient  is currently asymptomatic.  She has mild dizziness since yesterday which I am thinking is less likely to be related to the echo findings.  # Trastuzumab induced cardiotoxicity -Plan as above  # Nail changes -Likely secondary to chemotherapy.  Previously treated with doxycycline. -Improving.  # Anxiety -Worsened with the need of chemotherapy. -Continue with Ativan 0.5 mg every 8 hours as needed.  On Wellbutrin.  # Evaluated by genetics-testing negative  # Access-port.  Placed by Dr. Tonna Boehringer on 06/30/2022.  # Osteoporosis -On alendronate -Add vitamin D per patient's request.  Orders Placed This Encounter  Procedures   Ambulatory referral to Cardiology    Referral Priority:   Routine    Referral Type:   Consultation    Referral Reason:   Specialty Services Required    Number of Visits Requested:   1   ECHOCARDIOGRAM COMPLETE    Standing Status:   Future    Standing Expiration Date:   02/13/2024    Order Specific Question:   Where should this test be performed    Answer:   [facility]    Order Specific Question:   Please indicate who you request to read the nuc med / echo results.    Answer:   [facility] Unassigned    Order Specific Question:   Perflutren DEFINITY (image enhancing agent) should be administered unless hypersensitivity or allergy exist    Answer:   Administer Perflutren    Order Specific Question:   Reason for exam-Echo    Answer:   Chemo  Z09   RTC in 5 weeks for MD visit  The total time spent in the appointment  was 30 minutes encounter with patients including review of chart and various tests results, discussions about plan of care and coordination of care plan   All questions were answered. The patient knows to call the clinic with any problems, questions or concerns. No barriers to learning was detected.  Michaelyn Barter, MD 11/18/20242:39 PM   HISTORY OF PRESENTING ILLNESS:  Jennifer Garrison 62 y.o. female with pmh of osteoporosis on alendronate and anxiety  was referred to medical oncology for further management of stage Ia left breast invasive ductal cancer HER2 positive  Patient felt a palpable mass in her left breast mid February 2024.  She sought medical attention from PCP.  Further imaging as below.  INTERVAL HISTORY- Patient seen today as follow-up for her maintenance Herceptin. She reports dizziness since yesterday.  Usually on changing position.  Denies any recent illnesses, fever, sore throat or cold.  She is taking antiallergy medication.  Denies any shortness of breath, chest pain or leg swelling.   I have reviewed her chart and materials related to her cancer extensively and collaborated history with the patient. Summary of oncologic history is as follows: Oncology History  Breast cancer (HCC)  05/11/2022 Mammogram   Diagnostic mammogram and ultrasound- FINDINGS: Full field and spot compression views of the LEFT breast demonstrate a 1 cm irregular mass within the UPPER INNER LEFT breast, at the site of patient concern. The patient has retropectoral implants.   On physical exam, a firm palpable mass identified at the 11 o'clock position of the LEFT breast 8 cm from the nipple.   Targeted ultrasound is performed, showing a 0.9 x 0.8 x 1 cm irregular hypoechoic mass at the 11 o'clock position of the LEFT breast 8 cm from the nipple.   No abnormal LEFT axillary lymph nodes are noted.   IMPRESSION: 1. Suspicious 1 cm UPPER INNER LEFT breast mass. Tissue sampling is recommended. 2. No abnormal appearing LEFT axillary lymph nodes.   05/17/2022 Cancer Staging   Staging form: Breast, AJCC 8th Edition - Clinical stage from 05/17/2022: Stage IA (cT1b, cN0, cM0, G3, ER-, PR-, HER2+) - Signed by Michaelyn Barter, MD on 05/27/2022 Stage prefix: Initial diagnosis Histologic grading system: 3 grade system   05/17/2022 Initial Biopsy   DIAGNOSIS:  A. BREAST, LEFT, 11:00, 8 CM FROM NIPPLE, ULTRASOUND-GUIDED BIOPSY:   - INVASIVE MAMMARY  CARCINOMA, NOS DUCTAL.   Size of invasive carcinoma: 10 mm in this sample  Histologic grade of invasive carcinoma: Grade 3                       Glandular/tubular differentiation score: 3                       Nuclear pleomorphism score: 3                       Mitotic rate score: 2                       Total score: 8  Ductal carcinoma in situ: Not identified  Lymphovascular invasion: Not identified   Estrogen Receptor (ER) Status: NEGATIVE (LESS THAN 1%)   Progesterone Receptor (PgR) Status: NEGATIVE (LESS THAN 1%)   HER2 (by immunohistochemistry): POSITIVE (Score 3+)       Percentage of cells with uniform intense complete membrane  staining: 80%  Ki-67: Not performed     Genetic Testing   No pathogenic variants  identified on the [facility] Multi-Cancer+RNA panel. VUS in MUTYH called c.1484G>A identified. The report date is 06/09/2022.  The Multi-Cancer + RNA Panel offered by [facility] includes sequencing and/or deletion/duplication analysis of the following 70 genes:  AIP*, ALK, APC*, ATM*, AXIN2*, BAP1*, BARD1*, BLM*, BMPR1A*, BRCA1*, BRCA2*, BRIP1*, CDC73*, CDH1*, CDK4, CDKN1B*, CDKN2A, CHEK2*, CTNNA1*, DICER1*, EPCAM, EGFR, FH*, FLCN*, GREM1, HOXB13, KIT, LZTR1, MAX*, MBD4, MEN1*, MET, MITF, MLH1*, MSH2*, MSH3*, MSH6*, MUTYH*, NF1*, NF2*, NTHL1*, PALB2*, PDGFRA, PMS2*, POLD1*, POLE*, POT1*, PRKAR1A*, PTCH1*, PTEN*, RAD51C*, RAD51D*, RB1*, RET, SDHA*, SDHAF2*, SDHB*, SDHC*, SDHD*, SMAD4*, SMARCA4*, SMARCB1*, SMARCE1*, STK11*, SUFU*, TMEM127*, TP53*, TSC1*, TSC2*, VHL*. RNA analysis is performed for * genes.   06/09/2022 Surgery   S/p left breast lumpectomy with SLNB by Dr. Tonna Boehringer  Pathology showed 1.4 cm IDC, grade 3, DCIS present grade 2-3 with comedonecrosis, 0/3 lymph nodes negative, margins negative, pT1c.  ER/PR negative.  HER2 positive   07/18/2022 -  Chemotherapy   Patient is on Treatment Plan : BREAST Paclitaxel + Trastuzumab q7d / Trastuzumab q21d      Menarche age 37 or  3 Age at first birth 56 Used birth control pills and Depo more than 5 years Menopause was age 34 Hysterectomy yes for prolapsed uterus HRT no History of breast biopsies yes for breast cyst in left lower Family history of lung cancer in mother who was smoker  MEDICAL HISTORY:  Past Medical History:  Diagnosis Date   Allergy    Anxiety    Arthritis    Breast cancer (HCC) 05/27/2022   IBS (irritable bowel syndrome)    Trigger finger    Left Middle Finger 11/2016    SURGICAL HISTORY: Past Surgical History:  Procedure Laterality Date   ABDOMINAL HYSTERECTOMY  02/2016   Only cervix remains (done for prolapse)   AUGMENTATION MAMMAPLASTY Bilateral 2010   BREAST BIOPSY Left 05/17/2022   [place] BX, ribbon clip, path pending   BREAST BIOPSY Left 05/17/2022   [place] LT BREAST BX W LOC DEV 1ST LESION IMG BX SPEC US GUIDE 05/17/2022 [facility]   BREAST CYST ASPIRATION Left 2010   neg   BREAST ENHANCEMENT SURGERY  1998   CHOLECYSTECTOMY     COLONOSCOPY WITH PROPOFOL N/A 10/10/2019   Procedure: COLONOSCOPY WITH PROPOFOL;  Surgeon: Wyline Mood, MD;  Location: [facility] ENDOSCOPY;  Service: Gastroenterology;  Laterality: N/A;   PARTIAL MASTECTOMY WITH AXILLARY SENTINEL LYMPH NODE BIOPSY Left 06/09/2022   Procedure: PARTIAL MASTECTOMY WITH AXILLARY SENTINEL LYMPH NODE BIOPSY;  Surgeon: Sung Amabile, DO;  Location: [facility] ORS;  Service: General;  Laterality: Left;   PORTACATH PLACEMENT N/A 06/30/2022   Procedure: INSERTION PORT-A-CATH;  Surgeon: Sung Amabile, DO;  Location: [facility] ORS;  Service: General;  Laterality: N/A;   TYMPANOPLASTY Left     SOCIAL HISTORY: Social History   Socioeconomic History   Marital status: Married    Spouse name: Onalee Hua   Number of children: 1   Years of education: Not on file   Highest education level: Not on file  Occupational History   Not on file  Tobacco Use   Smoking status: Former    Current packs/day: 0.00    Types: Cigarettes    Quit date: 09/09/1992     Years since quitting: 30.4   Smokeless tobacco: Current    Types: Chew   Tobacco comments:    Chewing tobacco   Vaping Use   Vaping status: Former  Substance and Sexual Activity   Alcohol use: Not Currently   Drug use: Yes  Types: Marijuana    Comment: daily   Sexual activity: Yes    Birth control/protection: None  Other Topics Concern   Not on file  Social History Narrative   Not on file   Social Determinants of Health   Financial Resource Strain: Not on file  Food Insecurity: No Food Insecurity (05/27/2022)   Hunger Vital Sign    Worried About Running Out of Food in the Last Year: Never true    Ran Out of Food in the Last Year: Never true  Transportation Needs: No Transportation Needs (05/27/2022)   PRAPARE - Administrator, Civil Service (Medical): No    Lack of Transportation (Non-Medical): No  Physical Activity: Not on file  Stress: Not on file  Social Connections: Not on file  Intimate Partner Violence: Not At Risk (05/27/2022)   Humiliation, Afraid, Rape, and Kick questionnaire    Fear of Current or Ex-Partner: No    Emotionally Abused: No    Physically Abused: No    Sexually Abused: No    FAMILY HISTORY: Family History  Problem Relation Age of Onset   Lung cancer Mother 37   Hypertension Mother    Lupus Father    Leukemia Cousin 6   Breast cancer Neg Hx     ALLERGIES:  is allergic to poison ivy extract.  MEDICATIONS:  Current Outpatient Medications  Medication Sig Dispense Refill   ADVANCED FIBER COMPLEX PO Take 3 tablets by mouth daily. Gummies     alendronate (FOSAMAX) 70 MG tablet Take 70 mg by mouth once a week.     buPROPion (WELLBUTRIN SR) 150 MG 12 hr tablet TAKE 1 TABLET TWICE A DAY 180 tablet 1   busPIRone (BUSPAR) 5 MG tablet Take 1 tablet (5 mg total) by mouth 2 (two) times daily as needed. 180 tablet 1   dicyclomine (BENTYL) 10 MG capsule Take 1 capsule (10 mg total) by mouth 3 (three) times daily as needed for up to 10 days for  spasms. 30 capsule 0   HYDROcodone-acetaminophen (NORCO) 5-325 MG tablet Take 1 tablet by mouth every 6 (six) hours as needed for up to 6 doses for moderate pain. (Patient not taking: Reported on 01/02/2023) 6 tablet 0   ibuprofen (ADVIL) 600 MG tablet Take 1 tablet (600 mg total) by mouth every 6 (six) hours as needed. 30 tablet 0   lidocaine-prilocaine (EMLA) cream Apply to affected area once 30 g 3   LORazepam (ATIVAN) 0.5 MG tablet Take 1 tablet (0.5 mg total) by mouth every 8 (eight) hours. 30 tablet 0   magic mouthwash (multi-ingredient) oral suspension Take 5 mLs by mouth 4 (four) times daily as needed. (Patient not taking: Reported on 01/05/2023) 300 mL 0   metroNIDAZOLE (METROCREAM) 0.75 % cream Apply topically as needed.     mupirocin cream (BACTROBAN) 2 % Apply 1 Application topically 2 (two) times daily. Apply to skin of nails twice a day for chemotherapy nail changes. 30 g 0   ondansetron (ZOFRAN) 8 MG tablet Take 1 tablet (8 mg total) by mouth every 8 (eight) hours as needed for nausea or vomiting. 30 tablet 1   OVER THE COUNTER MEDICATION 1.5 tablets in the morning and at bedtime. Calcium Bone Strength Supplement1     OVER THE COUNTER MEDICATION 2 tablets daily. Vitex Allyson Sabal (Patient not taking: Reported on 01/05/2023)     OVER THE COUNTER MEDICATION in the morning, at noon, and at bedtime. Milk Thisle Seeds Tea (Patient not taking:  Reported on 01/05/2023)     prochlorperazine (COMPAZINE) 10 MG tablet Take 1 tablet (10 mg total) by mouth every 6 (six) hours as needed for nausea or vomiting. 30 tablet 1   triamcinolone cream (KENALOG) 0.1 % Apply 1 Application topically 2 (two) times daily. Apply to nail plates twice a day for nail lifting due to chemotherapy. 28 g 0   No current facility-administered medications for this visit.    REVIEW OF SYSTEMS:   Pertinent information mentioned in HPI All other systems were reviewed with the patient and are negative.  PHYSICAL  EXAMINATION: ECOG PERFORMANCE STATUS: 0 - Asymptomatic  Vitals:   02/13/23 1306  BP: 110/62  Pulse: 65  Temp: (!) 97.2 F (36.2 C)  SpO2: 96%        Filed Weights   02/13/23 1306  Weight: 114 lb (51.7 kg)        GENERAL:alert, no distress and comfortable SKIN: skin color, texture, turgor are normal, no rashes or significant lesions EYES: normal, conjunctiva are pink and non-injected, sclera clear OROPHARYNX:no exudate, no erythema and lips, buccal mucosa, and tongue normal  NECK: supple, thyroid normal size, non-tender, without nodularity LYMPH:  no palpable lymphadenopathy in the cervical, axillary or inguinal LUNGS: clear to auscultation and percussion with normal breathing effort HEART: regular rate & rhythm and no murmurs and no lower extremity edema ABDOMEN:abdomen soft, non-tender and normal bowel sounds Musculoskeletal:no cyanosis of digits and no clubbing  PSYCH: alert & oriented x 3 with fluent speech NEURO: no focal motor/sensory deficits  LABORATORY DATA:  I have reviewed the data as listed Lab Results  Component Value Date   WBC 6.9 02/13/2023   HGB 13.8 02/13/2023   HCT 42.4 02/13/2023   MCV 86.7 02/13/2023   PLT 247 02/13/2023   Recent Labs    11/21/22 0918 12/12/22 0917 01/02/23 0832 02/13/23 1252  NA 138 138 137 139  K 4.2 3.4* 3.7 4.0  CL 108 109 108 104  CO2 25 21* 23 26  GLUCOSE 102* 123* 134* 120*  BUN 21 19 18 20   CREATININE 0.79 0.87 0.90 0.90  CALCIUM 8.9 8.3* 8.3* 9.0  GFRNONAA >60 >60 >60 >60  PROT 6.5  --  6.8 7.1  ALBUMIN 3.8  --  3.8 4.1  AST 18  --  15 19  ALT 16  --  13 16  ALKPHOS 53  --  46 50  BILITOT 0.6  --  0.6 0.7    RADIOGRAPHIC STUDIES: I have personally reviewed the radiological images as listed and agreed with the findings in the report. ECHOCARDIOGRAM COMPLETE  Result Date: 02/10/2023    ECHOCARDIOGRAM REPORT   Patient Name:   Jennifer Garrison Date of Exam: 02/10/2023 Medical Rec #:  [id number]    Height:        64.0 in Accession #:    [id number]   Weight:       117.7 lb Date of Birth:  02/15/1961     BSA:          1.562 m Patient Age:    62 years     BP:           111/58 mmHg Patient Gender: F            HR:           61 bpm. Exam Location:  [facility] Procedure: 2D Echo, Cardiac Doppler and Color Doppler Indications:     Chemo Z09  History:  Patient has prior history of Echocardiogram examinations, most                  recent 10/25/2022.  Sonographer:     Cristela Blue Referring Phys:  [id number] Michaelyn Barter Diagnosing Phys: Windell Norfolk  Sonographer Comments: Global longitudinal strain was attempted. IMPRESSIONS  1. Global longitudinal strain -16%. Left ventricular ejection fraction, by estimation, is 45 to 50%. Left ventricular ejection fraction by 3D volume is 47 %. The left ventricle has mildly decreased function. The left ventricle demonstrates global hypokinesis. Left ventricular diastolic parameters are consistent with Grade I diastolic dysfunction (impaired relaxation).  2. Right ventricular systolic function is normal. The right ventricular size is normal.  3. The mitral valve is normal in structure. Trivial mitral valve regurgitation.  4. The aortic valve is tricuspid. Aortic valve regurgitation is trivial. Aortic valve sclerosis is present, with no evidence of aortic valve stenosis.  5. The inferior vena cava is normal in size with greater than 50% respiratory variability, suggesting right atrial pressure of 3 mmHg. FINDINGS  Left Ventricle: Global longitudinal strain -16%. Left ventricular ejection fraction, by estimation, is 45 to 50%. Left ventricular ejection fraction by 3D volume is 47 %. The left ventricle has mildly decreased function. The left ventricle demonstrates global hypokinesis. The left ventricular internal cavity size was normal in size. There is no left ventricular hypertrophy. Left ventricular diastolic parameters are consistent with Grade I diastolic dysfunction (impaired relaxation).  Right Ventricle: The right ventricular size is normal. No increase in right ventricular wall thickness. Right ventricular systolic function is normal. Left Atrium: Left atrial size was normal in size. Right Atrium: Right atrial size was normal in size. Prominent Chiari network. Pericardium: There is no evidence of pericardial effusion. Mitral Valve: The mitral valve is normal in structure. Trivial mitral valve regurgitation. Tricuspid Valve: The tricuspid valve is normal in structure. Tricuspid valve regurgitation is trivial. Aortic Valve: The aortic valve is tricuspid. Aortic valve regurgitation is trivial. Aortic valve sclerosis is present, with no evidence of aortic valve stenosis. Aortic valve mean gradient measures 2.0 mmHg. Aortic valve peak gradient measures 3.6 mmHg. Aortic valve area, by VTI measures 2.65 cm. Pulmonic Valve: The pulmonic valve was not well visualized. Pulmonic valve regurgitation is not visualized. Aorta: The aortic root is normal in size and structure. Venous: The inferior vena cava is normal in size with greater than 50% respiratory variability, suggesting right atrial pressure of 3 mmHg. IAS/Shunts: No atrial level shunt detected by color flow Doppler.  LEFT VENTRICLE PLAX 2D LVIDd:         4.40 cm         Diastology LVIDs:         3.10 cm         LV e' medial:    5.87 cm/s LV PW:         0.80 cm         LV E/e' medial:  11.6 LV IVS:        0.70 cm         LV e' lateral:   14.70 cm/s LVOT diam:     2.00 cm         LV E/e' lateral: 4.6 LV SV:         57 LV SV Index:   36 LVOT Area:     3.14 cm        3D Volume EF  LV 3D EF:    Left                                             ventricul                                             ar                                             ejection                                             fraction                                             by 3D                                             volume is                                              47 %.                                 3D Volume EF:                                3D EF:        47 % RIGHT VENTRICLE RV Basal diam:  3.30 cm RV Mid diam:    2.80 cm RV S prime:     13.80 cm/s TAPSE (M-mode): 2.3 cm LEFT ATRIUM             Index        RIGHT ATRIUM           Index LA diam:        2.40 cm 1.54 cm/m   RA Area:     13.00 cm LA Vol (A2C):   37.6 ml 24.07 ml/m  RA Volume:   31.80 ml  20.36 ml/m LA Vol (A4C):   20.4 ml 13.06 ml/m LA Biplane Vol: 28.3 ml 18.12 ml/m  AORTIC VALVE AV Area (Vmax):    2.67 cm AV Area (Vmean):   2.61 cm AV Area (VTI):     2.65 cm AV Vmax:           94.90 cm/s AV Vmean:          66.800 cm/s AV VTI:            0.213 m AV Peak Grad:  3.6 mmHg AV Mean Grad:      2.0 mmHg LVOT Vmax:         80.60 cm/s LVOT Vmean:        55.400 cm/s LVOT VTI:          0.180 m LVOT/AV VTI ratio: 0.85  AORTA Ao Root diam: 3.50 cm MITRAL VALVE               TRICUSPID VALVE MV Area (PHT): 2.79 cm    TR Peak grad:   13.8 mmHg MV Decel Time: 272 msec    TR Vmax:        186.00 cm/s MV E velocity: 68.10 cm/s MV A velocity: 53.50 cm/s  SHUNTS MV E/A ratio:  1.27        Systemic VTI:  0.18 m                            Systemic Diam: 2.00 cm Windell Norfolk Electronically signed by Windell Norfolk Signature Date/Time: 02/10/2023/4:31:59 PM    Final

## 2023-02-14 ENCOUNTER — Other Ambulatory Visit: Payer: Self-pay | Admitting: *Deleted

## 2023-02-14 ENCOUNTER — Other Ambulatory Visit: Payer: Self-pay | Admitting: Internal Medicine

## 2023-02-15 ENCOUNTER — Telehealth: Payer: Self-pay | Admitting: *Deleted

## 2023-02-15 NOTE — Telephone Encounter (Signed)
Requested medication (s) are due for refill today:   Yes  Requested medication (s) are on the active medication list:   Yes  Future visit scheduled:   No      Last ordered: 08/18/2022 #180, 1 refill  Unable to refill because needs 6 month visit.   It looks like she is seen yearly instead of every 6 months.   Provider to review for reviews.   Looks like getting chemo for breast CA.       Requested Prescriptions  Pending Prescriptions Disp Refills   buPROPion (WELLBUTRIN SR) 150 MG 12 hr tablet [Pharmacy Med Name: BUPROPION HCL SR TABS 150MG ] 180 tablet 3    Sig: TAKE 1 TABLET TWICE A DAY     Psychiatry: Antidepressants - bupropion Failed - 02/14/2023 12:51 AM      Failed - Valid encounter within last 6 months    Recent Outpatient Visits           9 months ago Mass of upper inner quadrant of left breast   [place] [facility] [place], [place], NP   1 year ago Age-related osteoporosis without current pathological fracture   [facility] [place], Salvadore Oxford, NP   1 year ago Dermatitis due to plants, including poison ivy, sumac, and oak   [place] [facility] [place], Oswaldo Conroy, PA-C   1 year ago Encounter for general adult medical examination with abnormal findings   [place] [facility] [place], Salvadore Oxford, NP   1 year ago Sebaceous cyst   [place] [facility] [place], Salvadore Oxford, NP              Passed - Cr in normal range and within 360 days    Creatinine  Date Value Ref Range Status  02/13/2023 0.90 0.44 - 1.00 mg/dL Final   Creat  Date Value Ref Range Status  06/28/2021 0.88 0.50 - 1.05 mg/dL Final         Passed - AST in normal range and within 360 days    AST  Date Value Ref Range Status  02/13/2023 19 15 - 41 U/L Final         Passed - ALT in normal range and within 360 days    ALT  Date Value Ref Range Status  02/13/2023 16 0 - 44 U/L Final         Passed - Completed  PHQ-2 or PHQ-9 in the last 360 days      Passed - Last BP in normal range    BP Readings from Last 1 Encounters:  02/13/23 110/62

## 2023-02-15 NOTE — Telephone Encounter (Signed)
Cardiology called and cannot see patient until Late Jan or February. She is asking if she needs to be referred somewhere else or if we can call cardiology to get her a sooner appointment. Please advise

## 2023-02-22 ENCOUNTER — Encounter: Payer: Self-pay | Admitting: Cardiovascular Disease

## 2023-02-22 ENCOUNTER — Ambulatory Visit: Payer: 59 | Attending: Cardiovascular Disease | Admitting: Cardiovascular Disease

## 2023-02-22 VITALS — BP 110/66 | HR 52 | Ht 64.0 in | Wt 117.8 lb

## 2023-02-22 DIAGNOSIS — Z79899 Other long term (current) drug therapy: Secondary | ICD-10-CM | POA: Diagnosis not present

## 2023-02-22 DIAGNOSIS — Z5181 Encounter for therapeutic drug level monitoring: Secondary | ICD-10-CM | POA: Diagnosis not present

## 2023-02-22 NOTE — Patient Instructions (Signed)
Medication Instructions:  No changes *If you need a refill on your cardiac medications before your next appointment, please call your pharmacy*   Lab Work: None ordered If you have labs (blood work) drawn today and your tests are completely normal, you will receive your results only by: MyChart Message (if you have MyChart) OR A paper copy in the mail If you have any lab test that is abnormal or we need to change your treatment, we will call you to review the results.   Testing/Procedures: None ordered   Follow-Up: At [facility], you and your health needs are our priority.  As part of our continuing mission to provide you with exceptional heart care, we have created designated Provider Care Teams.  These Care Teams include your primary Cardiologist (physician) and Advanced Practice Providers (APPs -  Physician Assistants and Nurse Practitioners) who all work together to provide you with the care you need, when you need it.  We recommend signing up for the patient portal called "MyChart".  Sign up information is provided on this After Visit Summary.  MyChart is used to connect with patients for Virtual Visits (Telemedicine).  Patients are able to view lab/test results, encounter notes, upcoming appointments, etc.  Non-urgent messages can be sent to your provider as well.   To learn more about what you can do with MyChart, go to [email or web address].    Your next appointment:   6 month(s)  Provider:   You may see Dr. Kirke Corin or one of the following Advanced Practice Providers on your designated Care Team:   Nicolasa Ducking, NP Eula Listen, PA-C Cadence Fransico Michael, PA-C Charlsie Quest, NP Carlos Levering, NP

## 2023-02-22 NOTE — Progress Notes (Signed)
Cardiology Office Note   Date:  02/22/2023   ID:  Jennifer Garrison, DOB 08-19-1960, MRN [id number]  PCP:  Lorre Munroe, NP  Cardiologist:   Lorine Bears, MD   Chief Complaint  Patient presents with   Establish Care    Cardiotoxicity, medication reviewed verbally with patient      History of Present Illness: Jennifer Garrison is a 62 y.o. female who was referred by Dr. Alena Bills for evaluation of cardiomyopathy. She has known history of left breast cancer status postlumpectomy in March 2024.  She completed weekly Taxol and Herceptin and she is now on Herceptin every 3 weeks for a total of 1 year.  She did receive radiation therapy which was completed in August.  Her last Herceptin dose was 1 month ago. She had an echocardiogram done in April at baseline which showed an EF of 50 to 55%.  Another echo was done in July which showed an EF of 55 to 60%.  Most recent echocardiogram on November 15 was read as an EF of 45 to 50%.  I personally reviewed the images and the EF is closer to 50 to 55%.  I did not really appreciate significant change from the initial. The patient has no heart failure symptoms whatsoever.  She denies chest pain, shortness of breath, lower extremity edema, orthopnea or PND. She has no history of premature coronary artery disease or heart failure. She is not a smoker and drinks alcohol only socially.    Past Medical History:  Diagnosis Date   Allergy    Anxiety    Arthritis    Breast cancer (HCC) 05/27/2022   IBS (irritable bowel syndrome)    Trigger finger    Left Middle Finger 11/2016    Past Surgical History:  Procedure Laterality Date   ABDOMINAL HYSTERECTOMY  02/2016   Only cervix remains (done for prolapse)   AUGMENTATION MAMMAPLASTY Bilateral 2010   BREAST BIOPSY Left 05/17/2022   [place] BX, ribbon clip, path pending   BREAST BIOPSY Left 05/17/2022   [place] LT BREAST BX W LOC DEV 1ST LESION IMG BX SPEC US GUIDE 05/17/2022 [facility]   BREAST CYST  ASPIRATION Left 2010   neg   BREAST ENHANCEMENT SURGERY  1998   CHOLECYSTECTOMY     COLONOSCOPY WITH PROPOFOL N/A 10/10/2019   Procedure: COLONOSCOPY WITH PROPOFOL;  Surgeon: Wyline Mood, MD;  Location: [facility] ENDOSCOPY;  Service: Gastroenterology;  Laterality: N/A;   PARTIAL MASTECTOMY WITH AXILLARY SENTINEL LYMPH NODE BIOPSY Left 06/09/2022   Procedure: PARTIAL MASTECTOMY WITH AXILLARY SENTINEL LYMPH NODE BIOPSY;  Surgeon: Sung Amabile, DO;  Location: [facility] ORS;  Service: General;  Laterality: Left;   PORTACATH PLACEMENT N/A 06/30/2022   Procedure: INSERTION PORT-A-CATH;  Surgeon: Sung Amabile, DO;  Location: [facility] ORS;  Service: General;  Laterality: N/A;   TYMPANOPLASTY Left      Current Outpatient Medications  Medication Sig Dispense Refill   ADVANCED FIBER COMPLEX PO Take 3 tablets by mouth daily. Gummies     alendronate (FOSAMAX) 70 MG tablet Take 70 mg by mouth once a week.     buPROPion (WELLBUTRIN SR) 150 MG 12 hr tablet TAKE 1 TABLET TWICE A DAY 180 tablet 1   busPIRone (BUSPAR) 5 MG tablet Take 1 tablet (5 mg total) by mouth 2 (two) times daily as needed. 180 tablet 1   Calcium Carb-Cholecalciferol (CALCIUM 600 + D) 600-5 MG-MCG TABS 1 orally twice a day     HYDROcodone-acetaminophen (NORCO) 5-325 MG tablet Take  1 tablet by mouth every 6 (six) hours as needed for up to 6 doses for moderate pain. 6 tablet 0   ibuprofen (ADVIL) 600 MG tablet Take 1 tablet (600 mg total) by mouth every 6 (six) hours as needed. 30 tablet 0   lidocaine-prilocaine (EMLA) cream Apply to affected area once 30 g 3   LORazepam (ATIVAN) 0.5 MG tablet Take 1 tablet (0.5 mg total) by mouth every 8 (eight) hours. 30 tablet 0   magic mouthwash (multi-ingredient) oral suspension Take 5 mLs by mouth 4 (four) times daily as needed. 300 mL 0   metroNIDAZOLE (METROCREAM) 0.75 % cream Apply topically as needed.     mupirocin cream (BACTROBAN) 2 % Apply 1 Application topically 2 (two) times daily. Apply to skin of nails  twice a day for chemotherapy nail changes. 30 g 0   ondansetron (ZOFRAN) 8 MG tablet Take 1 tablet (8 mg total) by mouth every 8 (eight) hours as needed for nausea or vomiting. 30 tablet 1   OVER THE COUNTER MEDICATION 1.5 tablets in the morning and at bedtime. Calcium Bone Strength Supplement1     OVER THE COUNTER MEDICATION 2 tablets daily. Vitex Berry     OVER THE COUNTER MEDICATION in the morning, at noon, and at bedtime. Milk Thisle Seeds Tea     prochlorperazine (COMPAZINE) 10 MG tablet Take 1 tablet (10 mg total) by mouth every 6 (six) hours as needed for nausea or vomiting. 30 tablet 1   triamcinolone cream (KENALOG) 0.1 % Apply 1 Application topically 2 (two) times daily. Apply to nail plates twice a day for nail lifting due to chemotherapy. 28 g 0   dicyclomine (BENTYL) 10 MG capsule Take 1 capsule (10 mg total) by mouth 3 (three) times daily as needed for up to 10 days for spasms. 30 capsule 0   No current facility-administered medications for this visit.    Allergies:   Poison ivy extract    Social History:  The patient  reports that she quit smoking about 30 years ago. Her smoking use included cigarettes. Her smokeless tobacco use includes chew. She reports that she does not currently use alcohol. She reports current drug use. Drug: Marijuana.   Family History:  The patient's family history includes Hypertension in her mother; Leukemia (age of onset: 32) in her cousin; Lung cancer (age of onset: 58) in her mother; Lupus in her father.    ROS:  Please see the history of present illness.   Otherwise, review of systems are positive for none.   All other systems are reviewed and negative.    PHYSICAL EXAM: VS:  BP 110/66 (BP Location: Right Arm, Patient Position: Sitting, Cuff Size: Normal)   Pulse (!) 52   Ht 5\' 4"  (1.626 m)   Wt 117 lb 12.8 oz (53.4 kg)   SpO2 99%   BMI 20.22 kg/m  , BMI Body mass index is 20.22 kg/m. GEN: Well nourished, well developed, in no acute distress   HEENT: normal  Neck: no JVD, carotid bruits, or masses Cardiac: RRR; no murmurs, rubs, or gallops,no edema  Respiratory:  clear to auscultation bilaterally, normal work of breathing GI: soft, nontender, nondistended, + BS MS: no deformity or atrophy  Skin: warm and dry, no rash Neuro:  Strength and sensation are intact Psych: euthymic mood, full affect   EKG:  EKG is ordered today. The ekg ordered today demonstrates : Sinus bradycardia with a heart rate of 52 bpm.  No significant ST  or T wave changes.   Recent Labs: 02/13/2023: ALT 16; BUN 20; Creatinine 0.90; Hemoglobin 13.8; Platelet Count 247; Potassium 4.0; Sodium 139    Lipid Panel    Component Value Date/Time   CHOL 162 06/28/2021 0918   CHOL 165 09/17/2019 1136   TRIG 60 06/28/2021 0918   HDL 59 06/28/2021 0918   HDL 51 09/17/2019 1136   CHOLHDL 2.7 06/28/2021 0918   LDLCALC 88 06/28/2021 0918      Wt Readings from Last 3 Encounters:  02/22/23 117 lb 12.8 oz (53.4 kg)  02/13/23 114 lb (51.7 kg)  01/23/23 117 lb 11.6 oz (53.4 kg)           No data to display            ASSESSMENT AND PLAN:  1.  Herceptin associated cardiomyopathy: This seems to be borderline overall and her ejection fraction even on the most recent echocardiogram appears to be in the low normal range.  There is always some interpersonal variability in EF evaluation by echo. She has no heart failure symptoms whatsoever and I do not think there is indication for starting medications for cardiomyopathy at this time especially in the setting of baseline bradycardia and relatively low blood pressure. I agree with holding Herceptin for now until her echocardiogram is repeated next month on the 19th as scheduled.  As long as her EF is greater than 50%, she can resume Herceptin.  Her echo can be repeated after resumption in few months to ensure stability. I did discuss with her that Herceptin associated cardiomyopathy is usually mild and  reversible.  The majority of the time the benefit of continuing the medication outweighs the cardiac risk.    Disposition:   FU with me in 6 months  Signed,  Lorine Bears, MD  02/22/2023 3:18 PM    [facility]

## 2023-03-16 ENCOUNTER — Ambulatory Visit
Admission: RE | Admit: 2023-03-16 | Discharge: 2023-03-16 | Disposition: A | Discharge status: Home / Self Care | Payer: 59 | Source: Ambulatory Visit | Attending: Internal Medicine | Admitting: Internal Medicine

## 2023-03-16 DIAGNOSIS — Z5111 Encounter for antineoplastic chemotherapy: Secondary | ICD-10-CM | POA: Insufficient documentation

## 2023-03-16 DIAGNOSIS — I427 Cardiomyopathy due to drug and external agent: Secondary | ICD-10-CM | POA: Diagnosis not present

## 2023-03-16 DIAGNOSIS — I083 Combined rheumatic disorders of mitral, aortic and tricuspid valves: Secondary | ICD-10-CM | POA: Diagnosis not present

## 2023-03-16 LAB — ECHOCARDIOGRAM COMPLETE
AR max vel: 2.04 cm2
AV Area VTI: 2.11 cm2
AV Area mean vel: 1.88 cm2
AV Mean grad: 3 mm[Hg]
AV Peak grad: 5.7 mm[Hg]
Ao pk vel: 1.19 m/s
Area-P 1/2: 3.83 cm2
Calc EF: 53.9 %
MV VTI: 3.16 cm2
S' Lateral: 3.3 cm
Single Plane A2C EF: 55.8 %
Single Plane A4C EF: 47.9 %

## 2023-03-16 NOTE — Progress Notes (Signed)
*  PRELIMINARY RESULTS* Echocardiogram 2D Echocardiogram has been performed.  Cristela Blue 03/16/2023, 11:38 AM

## 2023-03-17 ENCOUNTER — Inpatient Hospital Stay: Payer: 59 | Attending: Internal Medicine | Admitting: Internal Medicine

## 2023-03-17 ENCOUNTER — Encounter: Payer: Self-pay | Admitting: Internal Medicine

## 2023-03-17 VITALS — BP 110/62 | HR 76 | Temp 97.4°F | Wt 114.0 lb

## 2023-03-17 DIAGNOSIS — F419 Anxiety disorder, unspecified: Secondary | ICD-10-CM | POA: Diagnosis not present

## 2023-03-17 DIAGNOSIS — Z79899 Other long term (current) drug therapy: Secondary | ICD-10-CM | POA: Diagnosis not present

## 2023-03-17 DIAGNOSIS — C50912 Malignant neoplasm of unspecified site of left female breast: Secondary | ICD-10-CM | POA: Diagnosis not present

## 2023-03-17 DIAGNOSIS — Z171 Estrogen receptor negative status [ER-]: Secondary | ICD-10-CM | POA: Diagnosis not present

## 2023-03-17 DIAGNOSIS — Z5112 Encounter for antineoplastic immunotherapy: Secondary | ICD-10-CM | POA: Diagnosis present

## 2023-03-17 DIAGNOSIS — Z923 Personal history of irradiation: Secondary | ICD-10-CM | POA: Diagnosis not present

## 2023-03-17 DIAGNOSIS — C50012 Malignant neoplasm of nipple and areola, left female breast: Secondary | ICD-10-CM | POA: Insufficient documentation

## 2023-03-17 NOTE — Progress Notes (Signed)
[facility] CONSULT NOTE  Patient Care Team: Lorre Munroe, NP as PCP - General (Internal Medicine) Iran Ouch, MD as PCP - Cardiology (Cardiology) Hulen Luster, RN as Oncology Nurse Navigator Michaelyn Barter, MD as Consulting Physician (Oncology) Carmina Miller, MD as Consulting Physician (Radiation Oncology)   CANCER STAGING   Cancer Staging  Breast cancer [facility]) Staging form: Breast, AJCC 8th Edition - Clinical stage from 05/17/2022: Stage IA (cT1b, cN0, cM0, G3, ER-, PR-, HER2+) - Signed by Michaelyn Barter, MD on 05/27/2022 Stage prefix: Initial diagnosis Histologic grading system: 3 grade system  Current treatment Weekly paclitaxel with Herceptin (APT regimen) started on 07/18/2022  ASSESSMENT & PLAN:  [name] 62 y.o. female with pmh of osteoporosis and anxiety was referred to medical oncology for management of left breast stage IA invasive ductal cancer.  # Left breast IDC stage IA, ER/PR negative, HER2 positive # Encounter for cardiac monitoring -Patient felt a palpable mass in her left breast.   -S/p left breast lumpectomy with SLNB by Dr. Tonna Boehringer on 06/09/2022. Pathology showed 1.4 cm IDC, grade 3, DCIS present grade 2-3 with comedonecrosis, 0/3 lymph nodes negative, margins negative, pT1c.  ER/PR negative.  HER2 positive  -Completed weekly Taxol and Heceptin x 12 on 10/03/2022. Now on Herceptin every 3 weeks for total of 1 year.   -Adjuvant radiation completed on 11/22/2022.  -Last mammogram from 10/05/2022 was negative.  Repeat in 1 year.  -Echo from 02/10/2023 showed decrease in the ejection fraction from 55 to 60% to 45 to 50%.  The left ventricle demonstrated global hypokinesis.  Grade 1 diastolic Dysfunction.  Herceptin was placed on hold.  -Seen by Dr. Kirke Corin, cardiologist -considering patient is asymptomatic there is no indication for cardiac medications.  Also with the echo reading there was concern for some interpersonal variation.  -Repeat  echo reviewed from 03/16/2023 with EF of 50 to 55%.  Left ventricle has no regional wall motion abnormalities.  Plan is to resume Herceptin next week.  There is an infusion chair on Tuesday 12/24 patient is agreeable for that day.  Coordinated with pharmacy for the drug.  She will need a reloading of Herceptin with 8 mg/kg.  In 3 weeks, infusion only.  I will follow-up with her in 6 weeks.  # Trastuzumab induced cardiotoxicity -Recovered.  Will resume Herceptin.  # Anxiety -Worsened with the need of chemotherapy. -Continue with Ativan 0.5 mg every 8 hours as needed.  On Wellbutrin.  # Evaluated by genetics-testing negative  # Access-port.  Placed by Dr. Tonna Boehringer on 06/30/2022.  # Osteoporosis -On alendronate  Orders Placed This Encounter  Procedures   CBC with Differential/Platelet    Standing Status:   Future    Expected Date:   03/21/2023    Expiration Date:   03/16/2024   Comprehensive metabolic panel    Standing Status:   Future    Expected Date:   03/21/2023    Expiration Date:   03/16/2024   CBC with Differential/Platelet    Standing Status:   Future    Expected Date:   05/02/2023    Expiration Date:   03/16/2024   Comprehensive metabolic panel    Standing Status:   Future    Expected Date:   05/02/2023    Expiration Date:   03/16/2024   12/24-labs, Herceptin 3 weeks-Herceptin only In 6 weeks-MD visit, labs, Herceptin  The total time spent in the appointment was 30 minutes encounter with patients including review of chart  and various tests results, discussions about plan of care and coordination of care plan   All questions were answered. The patient knows to call the clinic with any problems, questions or concerns. No barriers to learning was detected.  Michaelyn Barter, MD 12/20/20241:38 PM   HISTORY OF PRESENTING ILLNESS:  Jennifer Garrison 62 y.o. female with pmh of osteoporosis on alendronate and anxiety was referred to medical oncology for further management of stage Ia  left breast invasive ductal cancer HER2 positive  Patient felt a palpable mass in her left breast mid February 2024.  She sought medical attention from PCP.  Further imaging as below.  INTERVAL HISTORY- Patient seen today as follow-up for her maintenance Herceptin. She reports feeling well overall.  Denies any shortness of breath, chest pain, dizziness.  Gets tired easily on exertion.   I have reviewed her chart and materials related to her cancer extensively and collaborated history with the patient. Summary of oncologic history is as follows: Oncology History  Breast cancer (HCC)  05/11/2022 Mammogram   Diagnostic mammogram and ultrasound- FINDINGS: Full field and spot compression views of the LEFT breast demonstrate a 1 cm irregular mass within the UPPER INNER LEFT breast, at the site of patient concern. The patient has retropectoral implants.   On physical exam, a firm palpable mass identified at the 11 o'clock position of the LEFT breast 8 cm from the nipple.   Targeted ultrasound is performed, showing a 0.9 x 0.8 x 1 cm irregular hypoechoic mass at the 11 o'clock position of the LEFT breast 8 cm from the nipple.   No abnormal LEFT axillary lymph nodes are noted.   IMPRESSION: 1. Suspicious 1 cm UPPER INNER LEFT breast mass. Tissue sampling is recommended. 2. No abnormal appearing LEFT axillary lymph nodes.   05/17/2022 Cancer Staging   Staging form: Breast, AJCC 8th Edition - Clinical stage from 05/17/2022: Stage IA (cT1b, cN0, cM0, G3, ER-, PR-, HER2+) - Signed by Michaelyn Barter, MD on 05/27/2022 Stage prefix: Initial diagnosis Histologic grading system: 3 grade system   05/17/2022 Initial Biopsy   DIAGNOSIS:  A. BREAST, LEFT, 11:00, 8 CM FROM NIPPLE, ULTRASOUND-GUIDED BIOPSY:   - INVASIVE MAMMARY CARCINOMA, NOS DUCTAL.   Size of invasive carcinoma: 10 mm in this sample  Histologic grade of invasive carcinoma: Grade 3                       Glandular/tubular  differentiation score: 3                       Nuclear pleomorphism score: 3                       Mitotic rate score: 2                       Total score: 8  Ductal carcinoma in situ: Not identified  Lymphovascular invasion: Not identified   Estrogen Receptor (ER) Status: NEGATIVE (LESS THAN 1%)   Progesterone Receptor (PgR) Status: NEGATIVE (LESS THAN 1%)   HER2 (by immunohistochemistry): POSITIVE (Score 3+)       Percentage of cells with uniform intense complete membrane  staining: 80%  Ki-67: Not performed     Genetic Testing   No pathogenic variants identified on the [facility] Multi-Cancer+RNA panel. VUS in MUTYH called c.1484G>A identified. The report date is 06/09/2022.  The Multi-Cancer + RNA Panel offered by [facility] includes  sequencing and/or deletion/duplication analysis of the following 70 genes:  AIP*, ALK, APC*, ATM*, AXIN2*, BAP1*, BARD1*, BLM*, BMPR1A*, BRCA1*, BRCA2*, BRIP1*, CDC73*, CDH1*, CDK4, CDKN1B*, CDKN2A, CHEK2*, CTNNA1*, DICER1*, EPCAM, EGFR, FH*, FLCN*, GREM1, HOXB13, KIT, LZTR1, MAX*, MBD4, MEN1*, MET, MITF, MLH1*, MSH2*, MSH3*, MSH6*, MUTYH*, NF1*, NF2*, NTHL1*, PALB2*, PDGFRA, PMS2*, POLD1*, POLE*, POT1*, PRKAR1A*, PTCH1*, PTEN*, RAD51C*, RAD51D*, RB1*, RET, SDHA*, SDHAF2*, SDHB*, SDHC*, SDHD*, SMAD4*, SMARCA4*, SMARCB1*, SMARCE1*, STK11*, SUFU*, TMEM127*, TP53*, TSC1*, TSC2*, VHL*. RNA analysis is performed for * genes.   06/09/2022 Surgery   S/p left breast lumpectomy with SLNB by Dr. Tonna Boehringer  Pathology showed 1.4 cm IDC, grade 3, DCIS present grade 2-3 with comedonecrosis, 0/3 lymph nodes negative, margins negative, pT1c.  ER/PR negative.  HER2 positive   07/18/2022 -  Chemotherapy   Patient is on Treatment Plan : BREAST Paclitaxel + Trastuzumab q7d / Trastuzumab q21d      Menarche age 66 or 11 Age at first birth 23 Used birth control pills and Depo more than 5 years Menopause was age 7 Hysterectomy yes for prolapsed uterus HRT no History of breast  biopsies yes for breast cyst in left lower Family history of lung cancer in mother who was smoker  MEDICAL HISTORY:  Past Medical History:  Diagnosis Date   Allergy    Anxiety    Arthritis    Breast cancer (HCC) 05/27/2022   IBS (irritable bowel syndrome)    Trigger finger    Left Middle Finger 11/2016    SURGICAL HISTORY: Past Surgical History:  Procedure Laterality Date   ABDOMINAL HYSTERECTOMY  02/2016   Only cervix remains (done for prolapse)   AUGMENTATION MAMMAPLASTY Bilateral 2010   BREAST BIOPSY Left 05/17/2022   [place] BX, ribbon clip, path pending   BREAST BIOPSY Left 05/17/2022   [place] LT BREAST BX W LOC DEV 1ST LESION IMG BX SPEC US GUIDE 05/17/2022 [facility]   BREAST CYST ASPIRATION Left 2010   neg   BREAST ENHANCEMENT SURGERY  1998   CHOLECYSTECTOMY     COLONOSCOPY WITH PROPOFOL N/A 10/10/2019   Procedure: COLONOSCOPY WITH PROPOFOL;  Surgeon: Wyline Mood, MD;  Location: [facility] ENDOSCOPY;  Service: Gastroenterology;  Laterality: N/A;   PARTIAL MASTECTOMY WITH AXILLARY SENTINEL LYMPH NODE BIOPSY Left 06/09/2022   Procedure: PARTIAL MASTECTOMY WITH AXILLARY SENTINEL LYMPH NODE BIOPSY;  Surgeon: Sung Amabile, DO;  Location: [facility] ORS;  Service: General;  Laterality: Left;   PORTACATH PLACEMENT N/A 06/30/2022   Procedure: INSERTION PORT-A-CATH;  Surgeon: Sung Amabile, DO;  Location: [facility] ORS;  Service: General;  Laterality: N/A;   TYMPANOPLASTY Left     SOCIAL HISTORY: Social History   Socioeconomic History   Marital status: Married    Spouse name: Onalee Hua   Number of children: 1   Years of education: Not on file   Highest education level: Not on file  Occupational History   Not on file  Tobacco Use   Smoking status: Former    Current packs/day: 0.00    Types: Cigarettes    Quit date: 09/09/1992    Years since quitting: 30.5   Smokeless tobacco: Current    Types: Chew   Tobacco comments:    Chewing tobacco   Vaping Use   Vaping status: Every Day  Substance  and Sexual Activity   Alcohol use: Not Currently   Drug use: Yes    Types: Marijuana    Comment: daily   Sexual activity: Yes    Birth control/protection: None  Other Topics Concern  Not on file  Social History Narrative   Not on file   Social Drivers of Health   Financial Resource Strain: Not on file  Food Insecurity: No Food Insecurity (05/27/2022)   Hunger Vital Sign    Worried About Running Out of Food in the Last Year: Never true    Ran Out of Food in the Last Year: Never true  Transportation Needs: No Transportation Needs (05/27/2022)   PRAPARE - Administrator, Civil Service (Medical): No    Lack of Transportation (Non-Medical): No  Physical Activity: Not on file  Stress: Not on file  Social Connections: Not on file  Intimate Partner Violence: Not At Risk (05/27/2022)   Humiliation, Afraid, Rape, and Kick questionnaire    Fear of Current or Ex-Partner: No    Emotionally Abused: No    Physically Abused: No    Sexually Abused: No    FAMILY HISTORY: Family History  Problem Relation Age of Onset   Lung cancer Mother 33   Hypertension Mother    Lupus Father    Leukemia Cousin 6   Breast cancer Neg Hx     ALLERGIES:  is allergic to poison ivy extract.  MEDICATIONS:  Current Outpatient Medications  Medication Sig Dispense Refill   ADVANCED FIBER COMPLEX PO Take 3 tablets by mouth daily. Gummies     alendronate (FOSAMAX) 70 MG tablet Take 70 mg by mouth once a week.     buPROPion (WELLBUTRIN SR) 150 MG 12 hr tablet TAKE 1 TABLET TWICE A DAY 180 tablet 1   busPIRone (BUSPAR) 5 MG tablet Take 1 tablet (5 mg total) by mouth 2 (two) times daily as needed. 180 tablet 1   Calcium Carb-Cholecalciferol (CALCIUM 600 + D) 600-5 MG-MCG TABS 1 orally twice a day     HYDROcodone-acetaminophen (NORCO) 5-325 MG tablet Take 1 tablet by mouth every 6 (six) hours as needed for up to 6 doses for moderate pain. 6 tablet 0   ibuprofen (ADVIL) 600 MG tablet Take 1 tablet (600 mg  total) by mouth every 6 (six) hours as needed. 30 tablet 0   lidocaine-prilocaine (EMLA) cream Apply to affected area once 30 g 3   LORazepam (ATIVAN) 0.5 MG tablet Take 1 tablet (0.5 mg total) by mouth every 8 (eight) hours. 30 tablet 0   magic mouthwash (multi-ingredient) oral suspension Take 5 mLs by mouth 4 (four) times daily as needed. 300 mL 0   metroNIDAZOLE (METROCREAM) 0.75 % cream Apply topically as needed.     mupirocin cream (BACTROBAN) 2 % Apply 1 Application topically 2 (two) times daily. Apply to skin of nails twice a day for chemotherapy nail changes. 30 g 0   ondansetron (ZOFRAN) 8 MG tablet Take 1 tablet (8 mg total) by mouth every 8 (eight) hours as needed for nausea or vomiting. 30 tablet 1   OVER THE COUNTER MEDICATION 1.5 tablets in the morning and at bedtime. Calcium Bone Strength Supplement1     OVER THE COUNTER MEDICATION 2 tablets daily. Vitex Berry     OVER THE COUNTER MEDICATION in the morning, at noon, and at bedtime. Milk Thisle Seeds Tea     prochlorperazine (COMPAZINE) 10 MG tablet Take 1 tablet (10 mg total) by mouth every 6 (six) hours as needed for nausea or vomiting. 30 tablet 1   triamcinolone cream (KENALOG) 0.1 % Apply 1 Application topically 2 (two) times daily. Apply to nail plates twice a day for nail lifting due to chemotherapy.  28 g 0   dicyclomine (BENTYL) 10 MG capsule Take 1 capsule (10 mg total) by mouth 3 (three) times daily as needed for up to 10 days for spasms. 30 capsule 0   No current facility-administered medications for this visit.    REVIEW OF SYSTEMS:   Pertinent information mentioned in HPI All other systems were reviewed with the patient and are negative.  PHYSICAL EXAMINATION: ECOG PERFORMANCE STATUS: 0 - Asymptomatic  Vitals:   03/17/23 1303  BP: 110/62  Pulse: 76  Temp: (!) 97.4 F (36.3 C)  SpO2: 99%         Filed Weights   03/17/23 1303  Weight: 114 lb (51.7 kg)         GENERAL:alert, no distress and  comfortable SKIN: skin color, texture, turgor are normal, no rashes or significant lesions EYES: normal, conjunctiva are pink and non-injected, sclera clear OROPHARYNX:no exudate, no erythema and lips, buccal mucosa, and tongue normal  NECK: supple, thyroid normal size, non-tender, without nodularity LYMPH:  no palpable lymphadenopathy in the cervical, axillary or inguinal LUNGS: clear to auscultation and percussion with normal breathing effort HEART: regular rate & rhythm and no murmurs and no lower extremity edema ABDOMEN:abdomen soft, non-tender and normal bowel sounds Musculoskeletal:no cyanosis of digits and no clubbing  PSYCH: alert & oriented x 3 with fluent speech NEURO: no focal motor/sensory deficits  LABORATORY DATA:  I have reviewed the data as listed Lab Results  Component Value Date   WBC 6.9 02/13/2023   HGB 13.8 02/13/2023   HCT 42.4 02/13/2023   MCV 86.7 02/13/2023   PLT 247 02/13/2023   Recent Labs    11/21/22 0918 12/12/22 0917 01/02/23 0832 02/13/23 1252  NA 138 138 137 139  K 4.2 3.4* 3.7 4.0  CL 108 109 108 104  CO2 25 21* 23 26  GLUCOSE 102* 123* 134* 120*  BUN 21 19 18 20   CREATININE 0.79 0.87 0.90 0.90  CALCIUM 8.9 8.3* 8.3* 9.0  GFRNONAA >60 >60 >60 >60  PROT 6.5  --  6.8 7.1  ALBUMIN 3.8  --  3.8 4.1  AST 18  --  15 19  ALT 16  --  13 16  ALKPHOS 53  --  46 50  BILITOT 0.6  --  0.6 0.7    RADIOGRAPHIC STUDIES: I have personally reviewed the radiological images as listed and agreed with the findings in the report. ECHOCARDIOGRAM COMPLETE Result Date: 03/16/2023    ECHOCARDIOGRAM REPORT   Patient Name:   PYPER DAGES Date of Exam: 03/16/2023 Medical Rec #:  [id number]    Height:       64.0 in Accession #:    [id number]   Weight:       117.8 lb Date of Birth:  Aug 04, 1960     BSA:          1.562 m Patient Age:    62 years     BP:           110/66 mmHg Patient Gender: F            HR:           52 bpm. Exam Location:  [facility] Procedure: 2D Echo,  Cardiac Doppler, Color Doppler and Strain Analysis Indications:     Chemo Z09  History:         Patient has prior history of Echocardiogram examinations, most  recent 02/10/2023.  Sonographer:     Cristela Blue Referring Phys:  [id number] Michaelyn Barter Diagnosing Phys: Julien Nordmann MD  Sonographer Comments: Global longitudinal strain was attempted. IMPRESSIONS  1. Left ventricular ejection fraction, by estimation, is 50 to 55%. Left ventricular ejection fraction by PLAX is 52 %. The left ventricle has low normal function. The left ventricle has no regional wall motion abnormalities. Left ventricular diastolic parameters were normal. The average left ventricular global longitudinal strain is -17.6 %.  2. Right ventricular systolic function is normal. The right ventricular size is normal.  3. The mitral valve is normal in structure. Mild mitral valve regurgitation. No evidence of mitral stenosis.  4. Tricuspid valve regurgitation is mild to moderate.  5. The aortic valve is normal in structure. Aortic valve regurgitation is mild. No aortic stenosis is present.  6. The inferior vena cava is normal in size with greater than 50% respiratory variability, suggesting right atrial pressure of 3 mmHg. FINDINGS  Left Ventricle: Left ventricular ejection fraction, by estimation, is 50 to 55%. Left ventricular ejection fraction by PLAX is 52 %. The left ventricle has low normal function. The left ventricle has no regional wall motion abnormalities. The average left ventricular global longitudinal strain is -17.6 %. The left ventricular internal cavity size was normal in size. There is no left ventricular hypertrophy. Left ventricular diastolic parameters were normal. Right Ventricle: The right ventricular size is normal. No increase in right ventricular wall thickness. Right ventricular systolic function is normal. Left Atrium: Left atrial size was normal in size. Right Atrium: Right atrial size was normal in size.  Pericardium: There is no evidence of pericardial effusion. Mitral Valve: The mitral valve is normal in structure. Mild mitral valve regurgitation. No evidence of mitral valve stenosis. MV peak gradient, 2.2 mmHg. The mean mitral valve gradient is 1.0 mmHg. Tricuspid Valve: The tricuspid valve is normal in structure. Tricuspid valve regurgitation is mild to moderate. No evidence of tricuspid stenosis. Aortic Valve: The aortic valve is normal in structure. Aortic valve regurgitation is mild. No aortic stenosis is present. Aortic valve mean gradient measures 3.0 mmHg. Aortic valve peak gradient measures 5.7 mmHg. Aortic valve area, by VTI measures 2.11 cm. Pulmonic Valve: The pulmonic valve was normal in structure. Pulmonic valve regurgitation is not visualized. No evidence of pulmonic stenosis. Aorta: The aortic root is normal in size and structure. Venous: The inferior vena cava is normal in size with greater than 50% respiratory variability, suggesting right atrial pressure of 3 mmHg. IAS/Shunts: No atrial level shunt detected by color flow Doppler.  LEFT VENTRICLE PLAX 2D LV EF:         Left            Diastology                ventricular     LV e' medial:    11.00 cm/s                ejection        LV E/e' medial:  6.8                fraction by     LV e' lateral:   13.30 cm/s                PLAX is 52      LV E/e' lateral: 5.6                %. LVIDd:  4.50 cm         2D LVIDs:         3.30 cm         Longitudinal LV PW:         1.00 cm         Strain LV IVS:        0.90 cm         2D Strain GLS  -17.6 % LVOT diam:     2.00 cm         Avg: LV SV:         57 LV SV Index:   37 LVOT Area:     3.14 cm  LV Volumes (MOD) LV vol d, MOD    83.8 ml A2C: LV vol d, MOD    78.0 ml A4C: LV vol s, MOD    37.0 ml A2C: LV vol s, MOD    40.6 ml A4C: LV SV MOD A2C:   46.8 ml LV SV MOD A4C:   78.0 ml LV SV MOD BP:    44.6 ml RIGHT VENTRICLE RV Basal diam:  3.40 cm RV Mid diam:    2.90 cm RV S prime:     17.80 cm/s TAPSE  (M-mode): 2.2 cm LEFT ATRIUM           Index        RIGHT ATRIUM           Index LA diam:      2.70 cm 1.73 cm/m   RA Area:     16.60 cm LA Vol (A2C): 50.7 ml 32.45 ml/m  RA Volume:   41.10 ml  26.31 ml/m LA Vol (A4C): 25.7 ml 16.45 ml/m  AORTIC VALVE AV Area (Vmax):    2.04 cm AV Area (Vmean):   1.88 cm AV Area (VTI):     2.11 cm AV Vmax:           119.00 cm/s AV Vmean:          84.400 cm/s AV VTI:            0.273 m AV Peak Grad:      5.7 mmHg AV Mean Grad:      3.0 mmHg LVOT Vmax:         77.40 cm/s LVOT Vmean:        50.600 cm/s LVOT VTI:          0.183 m LVOT/AV VTI ratio: 0.67  AORTA Ao Root diam: 3.50 cm MITRAL VALVE               TRICUSPID VALVE MV Area (PHT): 3.83 cm    TR Peak grad:   13.4 mmHg MV Area VTI:   3.16 cm    TR Vmax:        183.00 cm/s MV Peak grad:  2.2 mmHg MV Mean grad:  1.0 mmHg    SHUNTS MV Vmax:       0.75 m/s    Systemic VTI:  0.18 m MV Vmean:      44.8 cm/s   Systemic Diam: 2.00 cm MV Decel Time: 198 msec MV E velocity: 74.90 cm/s MV A velocity: 57.10 cm/s MV E/A ratio:  1.31 Julien Nordmann MD Electronically signed by Julien Nordmann MD Signature Date/Time: 03/16/2023/4:26:11 PM    Final

## 2023-03-20 ENCOUNTER — Ambulatory Visit: Payer: 59 | Admitting: Internal Medicine

## 2023-03-21 ENCOUNTER — Inpatient Hospital Stay: Payer: 59

## 2023-03-21 ENCOUNTER — Ambulatory Visit: Payer: 59

## 2023-03-21 ENCOUNTER — Other Ambulatory Visit: Payer: 59

## 2023-03-21 VITALS — BP 113/67 | HR 61 | Temp 97.7°F | Resp 18

## 2023-03-21 DIAGNOSIS — C50912 Malignant neoplasm of unspecified site of left female breast: Secondary | ICD-10-CM

## 2023-03-21 DIAGNOSIS — Z5112 Encounter for antineoplastic immunotherapy: Secondary | ICD-10-CM | POA: Diagnosis not present

## 2023-03-21 DIAGNOSIS — Z79899 Other long term (current) drug therapy: Secondary | ICD-10-CM

## 2023-03-21 LAB — CBC WITH DIFFERENTIAL/PLATELET
Abs Immature Granulocytes: 0.02 10*3/uL (ref 0.00–0.07)
Basophils Absolute: 0.1 10*3/uL (ref 0.0–0.1)
Basophils Relative: 1 %
Eosinophils Absolute: 0.2 10*3/uL (ref 0.0–0.5)
Eosinophils Relative: 4 %
HCT: 39 % (ref 36.0–46.0)
Hemoglobin: 12.8 g/dL (ref 12.0–15.0)
Immature Granulocytes: 0 %
Lymphocytes Relative: 20 %
Lymphs Abs: 1.2 10*3/uL (ref 0.7–4.0)
MCH: 28.7 pg (ref 26.0–34.0)
MCHC: 32.8 g/dL (ref 30.0–36.0)
MCV: 87.4 fL (ref 80.0–100.0)
Monocytes Absolute: 0.4 10*3/uL (ref 0.1–1.0)
Monocytes Relative: 7 %
Neutro Abs: 3.9 10*3/uL (ref 1.7–7.7)
Neutrophils Relative %: 68 %
Platelets: 220 10*3/uL (ref 150–400)
RBC: 4.46 MIL/uL (ref 3.87–5.11)
RDW: 13.2 % (ref 11.5–15.5)
WBC: 5.8 10*3/uL (ref 4.0–10.5)
nRBC: 0 % (ref 0.0–0.2)

## 2023-03-21 LAB — COMPREHENSIVE METABOLIC PANEL
ALT: 15 U/L (ref 0–44)
AST: 19 U/L (ref 15–41)
Albumin: 3.4 g/dL — ABNORMAL LOW (ref 3.5–5.0)
Alkaline Phosphatase: 41 U/L (ref 38–126)
Anion gap: 9 (ref 5–15)
BUN: 14 mg/dL (ref 8–23)
CO2: 25 mmol/L (ref 22–32)
Calcium: 8.6 mg/dL — ABNORMAL LOW (ref 8.9–10.3)
Chloride: 104 mmol/L (ref 98–111)
Creatinine, Ser: 0.83 mg/dL (ref 0.44–1.00)
GFR, Estimated: >60 mL/min (ref >60)
Glucose, Bld: 99 mg/dL (ref 70–99)
Potassium: 3.9 mmol/L (ref 3.5–5.1)
Sodium: 138 mmol/L (ref 135–145)
Total Bilirubin: 1 mg/dL (ref <1.2)
Total Protein: 6.6 g/dL (ref 6.5–8.1)

## 2023-03-21 MED ORDER — ACETAMINOPHEN 325 MG PO TABS
650.0000 mg | ORAL_TABLET | Freq: Once | ORAL | Status: AC
Start: 2023-03-21 — End: 2023-03-21
  Administered 2023-03-21: 650 mg via ORAL
  Filled 2023-03-21: qty 2

## 2023-03-21 MED ORDER — SODIUM CHLORIDE 0.9 % IV SOLN
Freq: Once | INTRAVENOUS | Status: AC
Start: 2023-03-21 — End: 2023-03-21
  Filled 2023-03-21: qty 250

## 2023-03-21 MED ORDER — DIPHENHYDRAMINE HCL 25 MG PO CAPS
50.0000 mg | ORAL_CAPSULE | Freq: Once | ORAL | Status: AC
  Administered 2023-03-21: 50 mg via ORAL
  Filled 2023-03-21: qty 2

## 2023-03-21 MED ORDER — TRASTUZUMAB-ANNS CHEMO 150 MG IV SOLR
8.0000 mg/kg | Freq: Once | INTRAVENOUS | Status: AC
  Administered 2023-03-21: 420 mg via INTRAVENOUS
  Filled 2023-03-21: qty 20

## 2023-03-21 NOTE — Patient Instructions (Signed)
[facility] - A DEPT OF [facility]  Discharge Instructions: Thank you for choosing [facility] to provide your oncology and hematology care.  If you have a lab appointment with the Cancer Center, please go directly to the Cancer Center and check in at the registration area.  Wear comfortable clothing and clothing appropriate for easy access to any Portacath or PICC line.   We strive to give you quality time with your provider. You may need to reschedule your appointment if you arrive late (15 or more minutes).  Arriving late affects you and other patients whose appointments are after yours.  Also, if you miss three or more appointments without notifying the office, you may be dismissed from the clinic at the provider's discretion.      For prescription refill requests, have your pharmacy contact our office and allow 72 hours for refills to be completed.    Today you received the following chemotherapy and/or immunotherapy agents Kanjinti      To help prevent nausea and vomiting after your treatment, we encourage you to take your nausea medication as directed.  BELOW ARE SYMPTOMS THAT SHOULD BE REPORTED IMMEDIATELY: *FEVER GREATER THAN 100.4 F (38 C) OR HIGHER *CHILLS OR SWEATING *NAUSEA AND VOMITING THAT IS NOT CONTROLLED WITH YOUR NAUSEA MEDICATION *UNUSUAL SHORTNESS OF BREATH *UNUSUAL BRUISING OR BLEEDING *URINARY PROBLEMS (pain or burning when urinating, or frequent urination) *BOWEL PROBLEMS (unusual diarrhea, constipation, pain near the anus) TENDERNESS IN MOUTH AND THROAT WITH OR WITHOUT PRESENCE OF ULCERS (sore throat, sores in mouth, or a toothache) UNUSUAL RASH, SWELLING OR PAIN  UNUSUAL VAGINAL DISCHARGE OR ITCHING   Items with * indicate a potential emergency and should be followed up as soon as possible or go to the Emergency Department if any problems should occur.  Please show the CHEMOTHERAPY ALERT CARD or IMMUNOTHERAPY  ALERT CARD at check-in to the Emergency Department and triage nurse.  Should you have questions after your visit or need to cancel or reschedule your appointment, please contact [facility] - A DEPT OF [facility]  [phone] and follow the prompts.  Office hours are 8:00 a.m. to 4:30 p.m. Monday - Friday. Please note that voicemails left after 4:00 p.m. may not be returned until the following business day.  We are closed weekends and major holidays. You have access to a nurse at all times for urgent questions. Please call the main number to the clinic [phone] and follow the prompts.  For any non-urgent questions, you may also contact your provider using MyChart. We now offer e-Visits for anyone 35 and older to request care online for non-urgent symptoms. For details visit [email or web address].   Also download the MyChart app! Go to the app store, search "MyChart", open the app, select [facility], and log in with your MyChart username and password.

## 2023-04-03 ENCOUNTER — Telehealth: Payer: Self-pay | Admitting: Internal Medicine

## 2023-04-03 NOTE — Telephone Encounter (Signed)
 Per email from pharmacy and Jennifer Garrison On Jan 14th this pt chemo appt has to be moved to morning. Appt has been moved and I spoke with pt to let her know the change. Pt understood and asked that if her other appts caould also be in the mornings that would be great.

## 2023-04-04 ENCOUNTER — Encounter: Payer: Self-pay | Admitting: Internal Medicine

## 2023-04-07 ENCOUNTER — Telehealth: Payer: Self-pay

## 2023-04-07 NOTE — Telephone Encounter (Signed)
 Emailed [facility] the medical clearance form Dr. Alena Bills signed, and received confirmation email stating that the form was received. Then I called and sent the patient a MyChart message relaying this same message.

## 2023-04-11 ENCOUNTER — Inpatient Hospital Stay: Payer: 59 | Attending: Internal Medicine

## 2023-04-11 ENCOUNTER — Ambulatory Visit: Payer: 59

## 2023-04-11 VITALS — BP 116/67 | HR 67 | Temp 97.5°F | Resp 16 | Ht 64.0 in | Wt 111.2 lb

## 2023-04-11 DIAGNOSIS — Z5112 Encounter for antineoplastic immunotherapy: Secondary | ICD-10-CM | POA: Insufficient documentation

## 2023-04-11 DIAGNOSIS — C50012 Malignant neoplasm of nipple and areola, left female breast: Secondary | ICD-10-CM | POA: Insufficient documentation

## 2023-04-11 DIAGNOSIS — C50912 Malignant neoplasm of unspecified site of left female breast: Secondary | ICD-10-CM

## 2023-04-11 DIAGNOSIS — Z79899 Other long term (current) drug therapy: Secondary | ICD-10-CM | POA: Diagnosis not present

## 2023-04-11 DIAGNOSIS — Z171 Estrogen receptor negative status [ER-]: Secondary | ICD-10-CM | POA: Diagnosis not present

## 2023-04-11 MED ORDER — TRASTUZUMAB-ANNS CHEMO 150 MG IV SOLR
6.0000 mg/kg | Freq: Once | INTRAVENOUS | Status: AC
  Administered 2023-04-11: 300 mg via INTRAVENOUS
  Filled 2023-04-11: qty 14.29

## 2023-04-11 MED ORDER — SODIUM CHLORIDE 0.9 % IV SOLN
Freq: Once | INTRAVENOUS | Status: AC
  Filled 2023-04-11: qty 250

## 2023-04-11 MED ORDER — ACETAMINOPHEN 325 MG PO TABS
650.0000 mg | ORAL_TABLET | Freq: Once | ORAL | Status: AC
  Administered 2023-04-11: 650 mg via ORAL
  Filled 2023-04-11: qty 2

## 2023-04-11 MED ORDER — DIPHENHYDRAMINE HCL 25 MG PO CAPS
50.0000 mg | ORAL_CAPSULE | Freq: Once | ORAL | Status: AC
Start: 2023-04-11 — End: 2023-04-11
  Administered 2023-04-11: 50 mg via ORAL
  Filled 2023-04-11: qty 2

## 2023-04-11 NOTE — Patient Instructions (Signed)
 [facility] - A DEPT OF [facility]  Discharge Instructions: Thank you for choosing [facility] to provide your oncology and hematology care.  If you have a lab appointment with the Cancer Center, please go directly to the Cancer Center and check in at the registration area.  Wear comfortable clothing and clothing appropriate for easy access to any Portacath or PICC line.   We strive to give you quality time with your provider. You may need to reschedule your appointment if you arrive late (15 or more minutes).  Arriving late affects you and other patients whose appointments are after yours.  Also, if you miss three or more appointments without notifying the office, you may be dismissed from the clinic at the provider's discretion.      For prescription refill requests, have your pharmacy contact our office and allow 72 hours for refills to be completed.    Today you received the following chemotherapy and/or immunotherapy agents Kanjinti       To help prevent nausea and vomiting after your treatment, we encourage you to take your nausea medication as directed.  BELOW ARE SYMPTOMS THAT SHOULD BE REPORTED IMMEDIATELY: *FEVER GREATER THAN 100.4 F (38 C) OR HIGHER *CHILLS OR SWEATING *NAUSEA AND VOMITING THAT IS NOT CONTROLLED WITH YOUR NAUSEA MEDICATION *UNUSUAL SHORTNESS OF BREATH *UNUSUAL BRUISING OR BLEEDING *URINARY PROBLEMS (pain or burning when urinating, or frequent urination) *BOWEL PROBLEMS (unusual diarrhea, constipation, pain near the anus) TENDERNESS IN MOUTH AND THROAT WITH OR WITHOUT PRESENCE OF ULCERS (sore throat, sores in mouth, or a toothache) UNUSUAL RASH, SWELLING OR PAIN  UNUSUAL VAGINAL DISCHARGE OR ITCHING   Items with * indicate a potential emergency and should be followed up as soon as possible or go to the Emergency Department if any problems should occur.  Please show the CHEMOTHERAPY ALERT CARD or IMMUNOTHERAPY  ALERT CARD at check-in to the Emergency Department and triage nurse.  Should you have questions after your visit or need to cancel or reschedule your appointment, please contact [facility] - A DEPT OF [facility]  [phone] and follow the prompts.  Office hours are 8:00 a.m. to 4:30 p.m. Monday - Friday. Please note that voicemails left after 4:00 p.m. may not be returned until the following business day.  We are closed weekends and major holidays. You have access to a nurse at all times for urgent questions. Please call the main number to the clinic [phone] and follow the prompts.  For any non-urgent questions, you may also contact your provider using MyChart. We now offer e-Visits for anyone 58 and older to request care online for non-urgent symptoms. For details visit [email or web address].   Also download the MyChart app! Go to the app store, search "MyChart", open the app, select [facility], and log in with your MyChart username and password.

## 2023-05-02 ENCOUNTER — Inpatient Hospital Stay: Payer: 59 | Attending: Internal Medicine

## 2023-05-02 ENCOUNTER — Ambulatory Visit: Payer: 59 | Admitting: Internal Medicine

## 2023-05-02 ENCOUNTER — Encounter: Payer: Self-pay | Admitting: Internal Medicine

## 2023-05-02 ENCOUNTER — Inpatient Hospital Stay: Payer: 59

## 2023-05-02 ENCOUNTER — Other Ambulatory Visit: Payer: 59

## 2023-05-02 ENCOUNTER — Ambulatory Visit: Payer: 59

## 2023-05-02 ENCOUNTER — Inpatient Hospital Stay (HOSPITAL_BASED_OUTPATIENT_CLINIC_OR_DEPARTMENT_OTHER): Payer: 59 | Admitting: Internal Medicine

## 2023-05-02 VITALS — BP 107/64 | HR 58

## 2023-05-02 VITALS — BP 119/80 | HR 89 | Temp 98.1°F | Resp 16 | Wt 115.0 lb

## 2023-05-02 DIAGNOSIS — Z923 Personal history of irradiation: Secondary | ICD-10-CM | POA: Diagnosis not present

## 2023-05-02 DIAGNOSIS — Z5181 Encounter for therapeutic drug level monitoring: Secondary | ICD-10-CM

## 2023-05-02 DIAGNOSIS — Z5112 Encounter for antineoplastic immunotherapy: Secondary | ICD-10-CM | POA: Insufficient documentation

## 2023-05-02 DIAGNOSIS — Z171 Estrogen receptor negative status [ER-]: Secondary | ICD-10-CM | POA: Diagnosis not present

## 2023-05-02 DIAGNOSIS — C50112 Malignant neoplasm of central portion of left female breast: Secondary | ICD-10-CM | POA: Insufficient documentation

## 2023-05-02 DIAGNOSIS — F419 Anxiety disorder, unspecified: Secondary | ICD-10-CM | POA: Diagnosis not present

## 2023-05-02 DIAGNOSIS — Z79899 Other long term (current) drug therapy: Secondary | ICD-10-CM | POA: Diagnosis not present

## 2023-05-02 DIAGNOSIS — C50012 Malignant neoplasm of nipple and areola, left female breast: Secondary | ICD-10-CM | POA: Diagnosis present

## 2023-05-02 DIAGNOSIS — C50912 Malignant neoplasm of unspecified site of left female breast: Secondary | ICD-10-CM

## 2023-05-02 LAB — COMPREHENSIVE METABOLIC PANEL
ALT: 15 U/L (ref 0–44)
AST: 19 U/L (ref 15–41)
Albumin: 3.9 g/dL (ref 3.5–5.0)
Alkaline Phosphatase: 47 U/L (ref 38–126)
Anion gap: 8 (ref 5–15)
BUN: 13 mg/dL (ref 8–23)
CO2: 25 mmol/L (ref 22–32)
Calcium: 9 mg/dL (ref 8.9–10.3)
Chloride: 106 mmol/L (ref 98–111)
Creatinine, Ser: 0.85 mg/dL (ref 0.44–1.00)
GFR, Estimated: >60 mL/min (ref >60)
Glucose, Bld: 109 mg/dL — ABNORMAL HIGH (ref 70–99)
Potassium: 3.8 mmol/L (ref 3.5–5.1)
Sodium: 139 mmol/L (ref 135–145)
Total Bilirubin: 0.9 mg/dL (ref 0.0–1.2)
Total Protein: 7 g/dL (ref 6.5–8.1)

## 2023-05-02 LAB — CBC WITH DIFFERENTIAL/PLATELET
Abs Immature Granulocytes: 0.04 10*3/uL (ref 0.00–0.07)
Basophils Absolute: 0.1 10*3/uL (ref 0.0–0.1)
Basophils Relative: 1 %
Eosinophils Absolute: 0.2 10*3/uL (ref 0.0–0.5)
Eosinophils Relative: 2 %
HCT: 40.1 % (ref 36.0–46.0)
Hemoglobin: 13.3 g/dL (ref 12.0–15.0)
Immature Granulocytes: 1 %
Lymphocytes Relative: 13 %
Lymphs Abs: 1.1 10*3/uL (ref 0.7–4.0)
MCH: 29 pg (ref 26.0–34.0)
MCHC: 33.2 g/dL (ref 30.0–36.0)
MCV: 87.6 fL (ref 80.0–100.0)
Monocytes Absolute: 0.4 10*3/uL (ref 0.1–1.0)
Monocytes Relative: 5 %
Neutro Abs: 6.5 10*3/uL (ref 1.7–7.7)
Neutrophils Relative %: 78 %
Platelets: 246 10*3/uL (ref 150–400)
RBC: 4.58 MIL/uL (ref 3.87–5.11)
RDW: 13 % (ref 11.5–15.5)
WBC: 8.2 10*3/uL (ref 4.0–10.5)
nRBC: 0 % (ref 0.0–0.2)

## 2023-05-02 MED ORDER — HEPARIN SOD (PORK) LOCK FLUSH 100 UNIT/ML IV SOLN
500.0000 [IU] | Freq: Once | INTRAVENOUS | Status: AC | PRN
  Administered 2023-05-02: 500 [IU]
  Filled 2023-05-02: qty 5

## 2023-05-02 MED ORDER — ACETAMINOPHEN 325 MG PO TABS
650.0000 mg | ORAL_TABLET | Freq: Once | ORAL | Status: AC
  Administered 2023-05-02: 650 mg via ORAL
  Filled 2023-05-02: qty 2

## 2023-05-02 MED ORDER — TRASTUZUMAB-ANNS CHEMO 150 MG IV SOLR
6.0000 mg/kg | Freq: Once | INTRAVENOUS | Status: AC
  Administered 2023-05-02: 300 mg via INTRAVENOUS
  Filled 2023-05-02: qty 14.29

## 2023-05-02 MED ORDER — SODIUM CHLORIDE 0.9 % IV SOLN
Freq: Once | INTRAVENOUS | Status: AC
  Filled 2023-05-02: qty 250

## 2023-05-02 MED ORDER — DIPHENHYDRAMINE HCL 25 MG PO CAPS
50.0000 mg | ORAL_CAPSULE | Freq: Once | ORAL | Status: AC
  Administered 2023-05-02: 50 mg via ORAL
  Filled 2023-05-02: qty 2

## 2023-05-02 NOTE — Patient Instructions (Signed)
 [facility] - A DEPT OF [facility]  Discharge Instructions: Thank you for choosing [facility] to provide your oncology and hematology care.  If you have a lab appointment with the Cancer Center, please go directly to the Cancer Center and check in at the registration area.  Wear comfortable clothing and clothing appropriate for easy access to any Portacath or PICC line.   We strive to give you quality time with your provider. You may need to reschedule your appointment if you arrive late (15 or more minutes).  Arriving late affects you and other patients whose appointments are after yours.  Also, if you miss three or more appointments without notifying the office, you may be dismissed from the clinic at the provider's discretion.      For prescription refill requests, have your pharmacy contact our office and allow 72 hours for refills to be completed.    Today you received the following chemotherapy and/or immunotherapy agents Kanjinti       To help prevent nausea and vomiting after your treatment, we encourage you to take your nausea medication as directed.  BELOW ARE SYMPTOMS THAT SHOULD BE REPORTED IMMEDIATELY: *FEVER GREATER THAN 100.4 F (38 C) OR HIGHER *CHILLS OR SWEATING *NAUSEA AND VOMITING THAT IS NOT CONTROLLED WITH YOUR NAUSEA MEDICATION *UNUSUAL SHORTNESS OF BREATH *UNUSUAL BRUISING OR BLEEDING *URINARY PROBLEMS (pain or burning when urinating, or frequent urination) *BOWEL PROBLEMS (unusual diarrhea, constipation, pain near the anus) TENDERNESS IN MOUTH AND THROAT WITH OR WITHOUT PRESENCE OF ULCERS (sore throat, sores in mouth, or a toothache) UNUSUAL RASH, SWELLING OR PAIN  UNUSUAL VAGINAL DISCHARGE OR ITCHING   Items with * indicate a potential emergency and should be followed up as soon as possible or go to the Emergency Department if any problems should occur.  Please show the CHEMOTHERAPY ALERT CARD or IMMUNOTHERAPY  ALERT CARD at check-in to the Emergency Department and triage nurse.  Should you have questions after your visit or need to cancel or reschedule your appointment, please contact [facility] - A DEPT OF [facility]  [phone] and follow the prompts.  Office hours are 8:00 a.m. to 4:30 p.m. Monday - Friday. Please note that voicemails left after 4:00 p.m. may not be returned until the following business day.  We are closed weekends and major holidays. You have access to a nurse at all times for urgent questions. Please call the main number to the clinic [phone] and follow the prompts.  For any non-urgent questions, you may also contact your provider using MyChart. We now offer e-Visits for anyone 58 and older to request care online for non-urgent symptoms. For details visit [email or web address].   Also download the MyChart app! Go to the app store, search "MyChart", open the app, select [facility], and log in with your MyChart username and password.

## 2023-05-02 NOTE — Progress Notes (Signed)
 Patient is going on a 2 week trip at the end of July and would like to know if she would be done with treatment, as long as there wasn't any issues with her heart or labs. She is doing well, her hair is growing back good, and her nails are getting much better.

## 2023-05-02 NOTE — Progress Notes (Signed)
 [facility] CONSULT NOTE  Patient Care Team: Antonette Angeline ORN, NP as PCP - General (Internal Medicine) Darron Deatrice LABOR, MD as PCP - Cardiology (Cardiology) Georgina Shasta POUR, RN as Oncology Nurse Navigator Clista Bimler, MD as Consulting Physician (Oncology) Lenn Aran, MD as Consulting Physician (Radiation Oncology)   CANCER STAGING   Cancer Staging  Breast cancer [facility]) Staging form: Breast, AJCC 8th Edition - Clinical stage from 05/17/2022: Stage IA (cT1b, cN0, cM0, G3, ER-, PR-, HER2+) - Signed by Roshanna Cimino, MD on 05/27/2022 Stage prefix: Initial diagnosis Histologic grading system: 3 grade system  Current treatment Weekly paclitaxel  with Herceptin  (APT regimen) started on 07/18/2022  ASSESSMENT & PLAN:  Jennifer Garrison 63 y.o. female with pmh of osteoporosis and anxiety was referred to medical oncology for management of left breast stage IA invasive ductal cancer.  # Left breast IDC stage IA, ER/PR negative, HER2 positive # Encounter for cardiac monitoring -Patient felt a palpable mass in her left breast.   -S/p left breast lumpectomy with SLNB by Dr. Tye on 06/09/2022. Pathology showed 1.4 cm IDC, grade 3, DCIS present grade 2-3 with comedonecrosis, 0/3 lymph nodes negative, margins negative, pT1c.  ER/PR negative.  HER2 positive  -Completed weekly Taxol  and Heceptin x 12 on 10/03/2022. Now on Herceptin  every 3 weeks for total of 1 year.   -Adjuvant radiation completed on 11/22/2022.  -Last mammogram from 10/05/2022 was negative.  Repeat in 1 year.  -Labs reviewed and acceptable for treatment.  Will proceed with Herceptin  maintenance 6 mg/kg every 3 weeks.  She will come in in 3 weeks for infusion only.  I will follow-up with her in 6 weeks.  Scheduled for echo week of March 10 for monitoring on Herceptin .   # Trastuzumab  induced cardiotoxicity -Recovered.  Will resume Herceptin . -Echo from 02/10/2023 showed decrease in the ejection fraction from 55 to 60%  to 45 to 50%.  The left ventricle demonstrated global hypokinesis.  Grade 1 diastolic Dysfunction.  Herceptin  was placed on hold. -Seen by Dr. Darron, cardiologist -considering patient is asymptomatic there is no indication for cardiac medications.  Also with the echo reading there was concern for some interpersonal variation.  # Anxiety -Worsened with the need of chemotherapy. -Continue with Ativan  0.5 mg every 8 hours as needed.  On Wellbutrin .  # Evaluated by genetics-testing negative  # Access-port.  Placed by Dr. Tye on 06/30/2022.  # Osteoporosis -On alendronate  Orders Placed This Encounter  Procedures   CBC with Differential/Platelet    Standing Status:   Future    Expected Date:   06/13/2023    Expiration Date:   05/01/2024   Comprehensive metabolic panel    Standing Status:   Future    Expected Date:   06/13/2023    Expiration Date:   05/01/2024   ECHOCARDIOGRAM COMPLETE    Standing Status:   Future    Expected Date:   06/05/2023    Expiration Date:   05/01/2024    Where should this test be performed:   [facility]    Please indicate who you request to read the nuc med / echo results.:   [facility] Readers    Perflutren DEFINITY (image enhancing agent) should be administered unless hypersensitivity or allergy exist:   Administer Perflutren    Reason for exam-Echo:   Chemo  Z09   Rest as scheduled.  The total time spent in the appointment was 30 minutes encounter with patients including review of chart and  various tests results, discussions about plan of care and coordination of care plan   All questions were answered. The patient knows to call the clinic with any problems, questions or concerns. No barriers to learning was detected.  Wrenn Willcox, MD 2/4/20259:43 AM   HISTORY OF PRESENTING ILLNESS:  Jennifer Garrison 63 y.o. female with pmh of osteoporosis on alendronate and anxiety was referred to medical oncology for further management of stage Ia left breast invasive  ductal cancer HER2 positive  Patient felt a palpable mass in her left breast mid February 2024.  She sought medical attention from PCP.  Further imaging as below.  INTERVAL HISTORY- Patient seen today as follow-up for her maintenance Herceptin . She reports feeling well overall.  Denies any shortness of breath, chest pain, dizziness.  Gets tired easily on exertion.   I have reviewed her chart and materials related to her cancer extensively and collaborated history with the patient. Summary of oncologic history is as follows: Oncology History  Breast cancer (HCC)  05/11/2022 Mammogram   Diagnostic mammogram and ultrasound- FINDINGS: Full field and spot compression views of the LEFT breast demonstrate a 1 cm irregular mass within the UPPER INNER LEFT breast, at the site of patient concern. The patient has retropectoral implants.   On physical exam, a firm palpable mass identified at the 11 o'clock position of the LEFT breast 8 cm from the nipple.   Targeted ultrasound is performed, showing a 0.9 x 0.8 x 1 cm irregular hypoechoic mass at the 11 o'clock position of the LEFT breast 8 cm from the nipple.   No abnormal LEFT axillary lymph nodes are noted.   IMPRESSION: 1. Suspicious 1 cm UPPER INNER LEFT breast mass. Tissue sampling is recommended. 2. No abnormal appearing LEFT axillary lymph nodes.   05/17/2022 Cancer Staging   Staging form: Breast, AJCC 8th Edition - Clinical stage from 05/17/2022: Stage IA (cT1b, cN0, cM0, G3, ER-, PR-, HER2+) - Signed by Clista Bimler, MD on 05/27/2022 Stage prefix: Initial diagnosis Histologic grading system: 3 grade system   05/17/2022 Initial Biopsy   DIAGNOSIS:  A. BREAST, LEFT, 11:00, 8 CM FROM NIPPLE, ULTRASOUND-GUIDED BIOPSY:   - INVASIVE MAMMARY CARCINOMA, NOS DUCTAL.   Size of invasive carcinoma: 10 mm in this sample  Histologic grade of invasive carcinoma: Grade 3                       Glandular/tubular differentiation score: 3                        Nuclear pleomorphism score: 3                       Mitotic rate score: 2                       Total score: 8  Ductal carcinoma in situ: Not identified  Lymphovascular invasion: Not identified   Estrogen Receptor (ER) Status: NEGATIVE (LESS THAN 1%)   Progesterone  Receptor (PgR) Status: NEGATIVE (LESS THAN 1%)   HER2 (by immunohistochemistry): POSITIVE (Score 3+)       Percentage of cells with uniform intense complete membrane  staining: 80%  Ki-67: Not performed     Genetic Testing   No pathogenic variants identified on the [facility] Multi-Cancer+RNA panel. VUS in MUTYH called c.1484G>A identified. The report date is 06/09/2022.  The Multi-Cancer + RNA Panel offered by [facility] includes sequencing  and/or deletion/duplication analysis of the following 70 genes:  AIP*, ALK, APC*, ATM*, AXIN2*, BAP1*, BARD1*, BLM*, BMPR1A*, BRCA1*, BRCA2*, BRIP1*, CDC73*, CDH1*, CDK4, CDKN1B*, CDKN2A, CHEK2*, CTNNA1*, DICER1*, EPCAM, EGFR, FH*, FLCN*, GREM1, HOXB13, KIT, LZTR1, MAX*, MBD4, MEN1*, MET, MITF, MLH1*, MSH2*, MSH3*, MSH6*, MUTYH*, NF1*, NF2*, NTHL1*, PALB2*, PDGFRA, PMS2*, POLD1*, POLE*, POT1*, PRKAR1A*, PTCH1*, PTEN*, RAD51C*, RAD51D*, RB1*, RET, SDHA*, SDHAF2*, SDHB*, SDHC*, SDHD*, SMAD4*, SMARCA4*, SMARCB1*, SMARCE1*, STK11*, SUFU*, TMEM127*, TP53*, TSC1*, TSC2*, VHL*. RNA analysis is performed for * genes.   06/09/2022 Surgery   S/p left breast lumpectomy with SLNB by Dr. Tye  Pathology showed 1.4 cm IDC, grade 3, DCIS present grade 2-3 with comedonecrosis, 0/3 lymph nodes negative, margins negative, pT1c.  ER/PR negative.  HER2 positive   07/18/2022 -  Chemotherapy   Patient is on Treatment Plan : BREAST Paclitaxel  + Trastuzumab  q7d / Trastuzumab  q21d      Menarche age 24 or 47 Age at first birth 3 Used birth control pills and Depo more than 5 years Menopause was age 66 Hysterectomy yes for prolapsed uterus HRT no History of breast biopsies yes for breast cyst in left  lower Family history of lung cancer in mother who was smoker  MEDICAL HISTORY:  Past Medical History:  Diagnosis Date   Allergy    Anxiety    Arthritis    Breast cancer (HCC) 05/27/2022   IBS (irritable bowel syndrome)    Trigger finger    Left Middle Finger 11/2016    SURGICAL HISTORY: Past Surgical History:  Procedure Laterality Date   ABDOMINAL HYSTERECTOMY  02/2016   Only cervix remains (done for prolapse)   AUGMENTATION MAMMAPLASTY Bilateral 2010   BREAST BIOPSY Left 05/17/2022   US  BX, ribbon clip, path pending   BREAST BIOPSY Left 05/17/2022   US  LT BREAST BX W LOC DEV 1ST LESION IMG BX SPEC US  GUIDE 05/17/2022 [facility]   BREAST CYST ASPIRATION Left 2010   neg   BREAST ENHANCEMENT SURGERY  1998   CHOLECYSTECTOMY     COLONOSCOPY WITH PROPOFOL  N/A 10/10/2019   Procedure: COLONOSCOPY WITH PROPOFOL ;  Surgeon: Therisa Bi, MD;  Location: [facility] ENDOSCOPY;  Service: Gastroenterology;  Laterality: N/A;   PARTIAL MASTECTOMY WITH AXILLARY SENTINEL LYMPH NODE BIOPSY Left 06/09/2022   Procedure: PARTIAL MASTECTOMY WITH AXILLARY SENTINEL LYMPH NODE BIOPSY;  Surgeon: Tye Millet, DO;  Location: [facility] ORS;  Service: General;  Laterality: Left;   PORTACATH PLACEMENT N/A 06/30/2022   Procedure: INSERTION PORT-A-CATH;  Surgeon: Tye Millet, DO;  Location: [facility] ORS;  Service: General;  Laterality: N/A;   TYMPANOPLASTY Left     SOCIAL HISTORY: Social History   Socioeconomic History   Marital status: Married    Spouse name: Alm   Number of children: 1   Years of education: Not on file   Highest education level: Not on file  Occupational History   Not on file  Tobacco Use   Smoking status: Former    Current packs/day: 0.00    Types: Cigarettes    Quit date: 09/09/1992    Years since quitting: 30.6   Smokeless tobacco: Current    Types: Chew   Tobacco comments:    Chewing tobacco   Vaping Use   Vaping status: Every Day  Substance and Sexual Activity   Alcohol use:  Not Currently   Drug use: Yes    Types: Marijuana    Comment: daily   Sexual activity: Yes    Birth control/protection: None  Other Topics Concern  Not on file  Social History Narrative   Not on file   Social Drivers of Health   Financial Resource Strain: Not on file  Food Insecurity: No Food Insecurity (05/27/2022)   Hunger Vital Sign    Worried About Running Out of Food in the Last Year: Never true    Ran Out of Food in the Last Year: Never true  Transportation Needs: No Transportation Needs (05/27/2022)   PRAPARE - Administrator, Civil Service (Medical): No    Lack of Transportation (Non-Medical): No  Physical Activity: Not on file  Stress: Not on file  Social Connections: Not on file  Intimate Partner Violence: Not At Risk (05/27/2022)   Humiliation, Afraid, Rape, and Kick questionnaire    Fear of Current or Ex-Partner: No    Emotionally Abused: No    Physically Abused: No    Sexually Abused: No    FAMILY HISTORY: Family History  Problem Relation Age of Onset   Lung cancer Mother 109   Hypertension Mother    Lupus Father    Leukemia Cousin 6   Breast cancer Neg Hx     ALLERGIES:  is allergic to poison ivy extract.  MEDICATIONS:  Current Outpatient Medications  Medication Sig Dispense Refill   ADVANCED FIBER COMPLEX PO Take 3 tablets by mouth daily. Gummies     alendronate (FOSAMAX) 70 MG tablet Take 70 mg by mouth once a week.     buPROPion  (WELLBUTRIN  SR) 150 MG 12 hr tablet TAKE 1 TABLET TWICE A DAY 180 tablet 1   busPIRone  (BUSPAR ) 5 MG tablet Take 1 tablet (5 mg total) by mouth 2 (two) times daily as needed. 180 tablet 1   Calcium  Carb-Cholecalciferol (CALCIUM  600 + D) 600-5 MG-MCG TABS 1 orally twice a day     dicyclomine  (BENTYL ) 10 MG capsule Take 1 capsule (10 mg total) by mouth 3 (three) times daily as needed for up to 10 days for spasms. 30 capsule 0   HYDROcodone -acetaminophen  (NORCO) 5-325 MG tablet Take 1 tablet by mouth every 6 (six) hours  as needed for up to 6 doses for moderate pain. 6 tablet 0   ibuprofen  (ADVIL ) 600 MG tablet Take 1 tablet (600 mg total) by mouth every 6 (six) hours as needed. 30 tablet 0   lidocaine -prilocaine  (EMLA ) cream Apply to affected area once 30 g 3   LORazepam  (ATIVAN ) 0.5 MG tablet Take 1 tablet (0.5 mg total) by mouth every 8 (eight) hours. 30 tablet 0   magic mouthwash (multi-ingredient) oral suspension Take 5 mLs by mouth 4 (four) times daily as needed. 300 mL 0   metroNIDAZOLE  (METROCREAM ) 0.75 % cream Apply topically as needed.     mupirocin  cream (BACTROBAN ) 2 % Apply 1 Application topically 2 (two) times daily. Apply to skin of nails twice a day for chemotherapy nail changes. 30 g 0   ondansetron  (ZOFRAN ) 8 MG tablet Take 1 tablet (8 mg total) by mouth every 8 (eight) hours as needed for nausea or vomiting. 30 tablet 1   OVER THE COUNTER MEDICATION 1.5 tablets in the morning and at bedtime. Calcium  Bone Strength Supplement1     OVER THE COUNTER MEDICATION 2 tablets daily. Vitex Berry     OVER THE COUNTER MEDICATION in the morning, at noon, and at bedtime. Milk Thisle Seeds Tea     prochlorperazine  (COMPAZINE ) 10 MG tablet Take 1 tablet (10 mg total) by mouth every 6 (six) hours as needed for nausea or vomiting.  30 tablet 1   triamcinolone  cream (KENALOG ) 0.1 % Apply 1 Application topically 2 (two) times daily. Apply to nail plates twice a day for nail lifting due to chemotherapy. 28 g 0   No current facility-administered medications for this visit.   Facility-Administered Medications Ordered in Other Visits  Medication Dose Route Frequency Provider Last Rate Last Admin   heparin  lock flush 100 unit/mL  500 Units Intracatheter Once PRN Alijah Hyde, MD       trastuzumab -anns (KANJINTI ) 300 mg in sodium chloride  0.9 % 250 mL chemo infusion  6 mg/kg (Treatment Plan Recorded) Intravenous Once Anarosa Kubisiak, MD        REVIEW OF SYSTEMS:   Pertinent information mentioned in HPI All other  systems were reviewed with the patient and are negative.  PHYSICAL EXAMINATION: ECOG PERFORMANCE STATUS: 0 - Asymptomatic  Vitals:   05/02/23 0906  BP: 119/80  Pulse: 89  Resp: 16  Temp: 98.1 F (36.7 C)  SpO2: 99%         Filed Weights   05/02/23 0906  Weight: 115 lb (52.2 kg)         GENERAL:alert, no distress and comfortable SKIN: skin color, texture, turgor are normal, no rashes or significant lesions EYES: normal, conjunctiva are pink and non-injected, sclera clear OROPHARYNX:no exudate, no erythema and lips, buccal mucosa, and tongue normal  NECK: supple, thyroid normal size, non-tender, without nodularity LYMPH:  no palpable lymphadenopathy in the cervical, axillary or inguinal LUNGS: clear to auscultation and percussion with normal breathing effort HEART: regular rate & rhythm and no murmurs and no lower extremity edema ABDOMEN:abdomen soft, non-tender and normal bowel sounds Musculoskeletal:no cyanosis of digits and no clubbing  PSYCH: alert & oriented x 3 with fluent speech NEURO: no focal motor/sensory deficits  LABORATORY DATA:  I have reviewed the data as listed Lab Results  Component Value Date   WBC 8.2 05/02/2023   HGB 13.3 05/02/2023   HCT 40.1 05/02/2023   MCV 87.6 05/02/2023   PLT 246 05/02/2023   Recent Labs    02/13/23 1252 03/21/23 0956 05/02/23 0847  NA 139 138 139  K 4.0 3.9 3.8  CL 104 104 106  CO2 26 25 25   GLUCOSE 120* 99 109*  BUN 20 14 13   CREATININE 0.90 0.83 0.85  CALCIUM  9.0 8.6* 9.0  GFRNONAA >60 >60 >60  PROT 7.1 6.6 7.0  ALBUMIN 4.1 3.4* 3.9  AST 19 19 19   ALT 16 15 15   ALKPHOS 50 41 47  BILITOT 0.7 1.0 0.9    RADIOGRAPHIC STUDIES: I have personally reviewed the radiological images as listed and agreed with the findings in the report. No results found.

## 2023-05-16 ENCOUNTER — Encounter: Payer: 59 | Admitting: Internal Medicine

## 2023-05-19 ENCOUNTER — Encounter: Payer: Self-pay | Admitting: Internal Medicine

## 2023-05-19 ENCOUNTER — Ambulatory Visit (INDEPENDENT_AMBULATORY_CARE_PROVIDER_SITE_OTHER): Payer: 59 | Admitting: Internal Medicine

## 2023-05-19 VITALS — BP 124/80 | Ht 64.0 in | Wt 116.2 lb

## 2023-05-19 DIAGNOSIS — Z136 Encounter for screening for cardiovascular disorders: Secondary | ICD-10-CM

## 2023-05-19 DIAGNOSIS — R739 Hyperglycemia, unspecified: Secondary | ICD-10-CM

## 2023-05-19 DIAGNOSIS — Z0001 Encounter for general adult medical examination with abnormal findings: Secondary | ICD-10-CM | POA: Diagnosis not present

## 2023-05-19 DIAGNOSIS — N39491 Coital incontinence: Secondary | ICD-10-CM | POA: Insufficient documentation

## 2023-05-19 MED ORDER — BUPROPION HCL ER (XL) 150 MG PO TB24: 150.0000 mg | ORAL_TABLET | Freq: Every day | ORAL | 1 refills | Status: DC

## 2023-05-19 MED ORDER — OXYBUTYNIN CHLORIDE ER 5 MG PO TB24: 5.0000 mg | ORAL_TABLET | Freq: Every day | ORAL | 1 refills | Status: DC

## 2023-05-19 MED ORDER — OXYBUTYNIN CHLORIDE ER 5 MG PO TB24: 5.0000 mg | ORAL_TABLET | Freq: Every day | ORAL | 0 refills | Status: DC

## 2023-05-19 MED ORDER — BUPROPION HCL ER (XL) 150 MG PO TB24: 150.0000 mg | ORAL_TABLET | Freq: Every day | ORAL | 0 refills | Status: DC

## 2023-05-19 NOTE — Progress Notes (Signed)
 Subjective:    Patient ID: Jennifer Garrison, female    DOB: September 06, 1960, 63 y.o.   MRN: [id number]  HPI  Patient presents to clinic today for her annual exam.  Flu: 12/2017 Tetanus: 08/2018 COVID: Pfizer x 2 Shingrix: never Pap smear: 08/2019 Mammogram: 09/2022 Bone density: 10/2021 Colon screening: 09/2019 Vision screening: annually Dentist: biannually  Diet: She does eat meat. She consumes some fruits and veggies. She tries to void fried foods. She drinks mostly water, orange juice. Exercise: Walking  Review of Systems     Past Medical History:  Diagnosis Date   Allergy    Anxiety    Arthritis    Breast cancer (HCC) 05/27/2022   IBS (irritable bowel syndrome)    Trigger finger    Left Middle Finger 11/2016    Current Outpatient Medications  Medication Sig Dispense Refill   ADVANCED FIBER COMPLEX PO Take 3 tablets by mouth daily. Gummies     alendronate (FOSAMAX) 70 MG tablet Take 70 mg by mouth once a week.     buPROPion (WELLBUTRIN SR) 150 MG 12 hr tablet TAKE 1 TABLET TWICE A DAY 180 tablet 1   busPIRone (BUSPAR) 5 MG tablet Take 1 tablet (5 mg total) by mouth 2 (two) times daily as needed. 180 tablet 1   Calcium Carb-Cholecalciferol (CALCIUM 600 + D) 600-5 MG-MCG TABS 1 orally twice a day     dicyclomine (BENTYL) 10 MG capsule Take 1 capsule (10 mg total) by mouth 3 (three) times daily as needed for up to 10 days for spasms. 30 capsule 0   HYDROcodone-acetaminophen (NORCO) 5-325 MG tablet Take 1 tablet by mouth every 6 (six) hours as needed for up to 6 doses for moderate pain. 6 tablet 0   ibuprofen (ADVIL) 600 MG tablet Take 1 tablet (600 mg total) by mouth every 6 (six) hours as needed. 30 tablet 0   lidocaine-prilocaine (EMLA) cream Apply to affected area once 30 g 3   LORazepam (ATIVAN) 0.5 MG tablet Take 1 tablet (0.5 mg total) by mouth every 8 (eight) hours. 30 tablet 0   magic mouthwash (multi-ingredient) oral suspension Take 5 mLs by mouth 4 (four) times daily as  needed. 300 mL 0   metroNIDAZOLE (METROCREAM) 0.75 % cream Apply topically as needed.     mupirocin cream (BACTROBAN) 2 % Apply 1 Application topically 2 (two) times daily. Apply to skin of nails twice a day for chemotherapy nail changes. 30 g 0   ondansetron (ZOFRAN) 8 MG tablet Take 1 tablet (8 mg total) by mouth every 8 (eight) hours as needed for nausea or vomiting. 30 tablet 1   OVER THE COUNTER MEDICATION 1.5 tablets in the morning and at bedtime. Calcium Bone Strength Supplement1     OVER THE COUNTER MEDICATION 2 tablets daily. Vitex Berry     OVER THE COUNTER MEDICATION in the morning, at noon, and at bedtime. Milk Thisle Seeds Tea     prochlorperazine (COMPAZINE) 10 MG tablet Take 1 tablet (10 mg total) by mouth every 6 (six) hours as needed for nausea or vomiting. 30 tablet 1   triamcinolone cream (KENALOG) 0.1 % Apply 1 Application topically 2 (two) times daily. Apply to nail plates twice a day for nail lifting due to chemotherapy. 28 g 0   No current facility-administered medications for this visit.    Allergies  Allergen Reactions   Poison Ivy Extract Rash    Family History  Problem Relation Age of Onset   Lung  cancer Mother 50   Hypertension Mother    Lupus Father    Leukemia Cousin 6   Breast cancer Neg Hx     Social History   Socioeconomic History   Marital status: Married    Spouse name: Onalee Hua   Number of children: 1   Years of education: Not on file   Highest education level: Associate degree: occupational, Scientist, product/process development, or vocational program  Occupational History   Not on file  Tobacco Use   Smoking status: Former    Current packs/day: 0.00    Types: Cigarettes    Quit date: 09/09/1992    Years since quitting: 30.7   Smokeless tobacco: Current    Types: Chew   Tobacco comments:    Chewing tobacco   Vaping Use   Vaping status: Every Day  Substance and Sexual Activity   Alcohol use: Not Currently   Drug use: Yes    Types: Marijuana    Comment: daily    Sexual activity: Yes    Birth control/protection: None  Other Topics Concern   Not on file  Social History Narrative   Not on file   Social Drivers of Health   Financial Resource Strain: Low Risk  (05/12/2023)   Overall Financial Resource Strain (CARDIA)    Difficulty of Paying Living Expenses: Not hard at all  Food Insecurity: No Food Insecurity (05/12/2023)   Hunger Vital Sign    Worried About Running Out of Food in the Last Year: Never true    Ran Out of Food in the Last Year: Never true  Transportation Needs: No Transportation Needs (05/12/2023)   PRAPARE - Administrator, Civil Service (Medical): No    Lack of Transportation (Non-Medical): No  Physical Activity: Sufficiently Active (05/12/2023)   Exercise Vital Sign    Days of Exercise per Week: 7 days    Minutes of Exercise per Session: 60 min  Stress: No Stress Concern Present (05/12/2023)   [employer] of Occupational Health - Occupational Stress Questionnaire    Feeling of Stress : Only a little  Social Connections: Moderately Integrated (05/12/2023)   Social Connection and Isolation Panel [NHANES]    Frequency of Communication with Friends and Family: Three times a week    Frequency of Social Gatherings with Friends and Family: Once a week    Attends Religious Services: More than 4 times per year    Active Member of [employer] or Organizations: No    Attends Engineer, structural: Not on file    Marital Status: Married  Catering manager Violence: Not At Risk (05/27/2022)   Humiliation, Afraid, Rape, and Kick questionnaire    Fear of Current or Ex-Partner: No    Emotionally Abused: No    Physically Abused: No    Sexually Abused: No     Constitutional: Denies fever, malaise, fatigue, headache or abrupt weight changes.  HEENT: Denies eye pain, eye redness, ear pain, ringing in the ears, wax buildup, runny nose, nasal congestion, bloody nose, or sore throat. Respiratory: Denies difficulty breathing,  shortness of breath, cough or sputum production.   Cardiovascular: Denies chest pain, chest tightness, palpitations or swelling in the hands or feet.  Gastrointestinal: Patient reports intermittent constipation and diarrhea.  Denies abdominal pain, bloating, or blood in the stool.  GU: Pt reports urinary incontinence. Denies urgency, frequency, pain with urination, burning sensation, blood in urine, odor or discharge. Musculoskeletal: Patient reports intermittent joint pains.  Denies decrease in range of motion, difficulty with  gait, muscle pain or joint swelling.  Skin: Denies redness, rashes, lesions or ulcercations.  Neurological: Denies dizziness, difficulty with memory, difficulty with speech or problems with balance and coordination.  Psych: Patient has a history of anxiety and depression.  Denies SI/HI.  No other specific complaints in a complete review of systems (except as listed in HPI above).  Objective:   Physical Exam  BP 124/80 (BP Location: Left Arm, Patient Position: Sitting, Cuff Size: Normal)   Ht 5\' 4"  (1.626 m)   Wt 116 lb 3.2 oz (52.7 kg)   BMI 19.95 kg/m    Wt Readings from Last 3 Encounters:  05/02/23 115 lb (52.2 kg)  04/11/23 111 lb 3.6 oz (50.4 kg)  03/17/23 114 lb (51.7 kg)    General: Appears her stated age, chronically ill appearing, in NAD. Skin: Warm, dry and intact.  HEENT: Head: normal shape and size; Eyes: sclera white and EOMs intact;  Neck:  Neck supple, trachea midline. No masses, lumps or thyromegaly present.  Cardiovascular: Normal rate and rhythm. S1,S2 noted.  No murmur, rubs or gallops noted. No JVD or BLE edema. No carotid bruits noted. Pulmonary/Chest: Normal effort and positive vesicular breath sounds. No respiratory distress. No wheezes, rales or ronchi noted.  Abdomen: Soft and nontender. Normal bowel sounds. No distention or masses noted. Liver, spleen and kidneys non palpable. Musculoskeletal: Strength 5/5 BUE/BLE.  No difficulty with  gait.  Neurological: Alert and oriented. Cranial nerves II-XII grossly intact. Coordination normal.  Psychiatric: Mood and affect normal. Behavior is normal. Judgment and thought content normal.    BMET    Component Value Date/Time   NA 139 05/02/2023 0847   NA 138 09/17/2019 1136   K 3.8 05/02/2023 0847   CL 106 05/02/2023 0847   CO2 25 05/02/2023 0847   GLUCOSE 109 (H) 05/02/2023 0847   BUN 13 05/02/2023 0847   BUN 16 09/17/2019 1136   CREATININE 0.85 05/02/2023 0847   CREATININE 0.90 02/13/2023 1252   CREATININE 0.88 06/28/2021 0918   CALCIUM 9.0 05/02/2023 0847   GFRNONAA >60 05/02/2023 0847   GFRNONAA >60 02/13/2023 1252   GFRAA 81 09/17/2019 1136    Lipid Panel     Component Value Date/Time   CHOL 162 06/28/2021 0918   CHOL 165 09/17/2019 1136   TRIG 60 06/28/2021 0918   HDL 59 06/28/2021 0918   HDL 51 09/17/2019 1136   CHOLHDL 2.7 06/28/2021 0918   LDLCALC 88 06/28/2021 0918    CBC    Component Value Date/Time   WBC 8.2 05/02/2023 0847   RBC 4.58 05/02/2023 0847   HGB 13.3 05/02/2023 0847   HGB 13.8 02/13/2023 1252   HGB 14.9 09/17/2019 1136   HCT 40.1 05/02/2023 0847   HCT 43.8 09/17/2019 1136   PLT 246 05/02/2023 0847   PLT 247 02/13/2023 1252   PLT 250 09/17/2019 1136   MCV 87.6 05/02/2023 0847   MCV 87 09/17/2019 1136   MCH 29.0 05/02/2023 0847   MCHC 33.2 05/02/2023 0847   RDW 13.0 05/02/2023 0847   RDW 11.9 09/17/2019 1136   LYMPHSABS 1.1 05/02/2023 0847   LYMPHSABS 2.1 09/17/2019 1136   MONOABS 0.4 05/02/2023 0847   EOSABS 0.2 05/02/2023 0847   EOSABS 0.2 09/17/2019 1136   BASOSABS 0.1 05/02/2023 0847   BASOSABS 0.1 09/17/2019 1136    Hgb A1C No results found for: "HGBA1C"         Assessment & Plan:   Preventative Health Maintenance:  Flu  shot declined Tetanus UTD Encouraged her to get her COVID booster Discussed Shingrix vaccine, she will check coverage with her insurance company Pap smear UTD Mammogram ordered per  oncology Bone density UTD Colon screening UTD Encouraged her to consume a balanced diet and exercise regimen Advised her to see an eye doctor and dentist annually We will check CBC, c-Met, lipid profile and A1c today  Urinary incontinence:  Will start Ditropan 5 mg XL daily.  RTC in 6 months, follow-up chronic conditions  Nicki Reaper, NP

## 2023-05-19 NOTE — Addendum Note (Signed)
 Addended by: Lorre Munroe on: 05/19/2023 11:22 AM   Modules accepted: Orders

## 2023-05-19 NOTE — Patient Instructions (Signed)
 Health Maintenance for Postmenopausal Women Menopause is a normal process in which your ability to get pregnant comes to an end. This process happens slowly over many months or years, usually between the ages of 24 and 62. Menopause is complete when you have missed your menstrual period for 12 months. It is important to talk with your health care provider about some of the most common conditions that affect women after menopause (postmenopausal women). These include heart disease, cancer, and bone loss (osteoporosis). Adopting a healthy lifestyle and getting preventive care can help to promote your health and wellness. The actions you take can also lower your chances of developing some of these common conditions. What are the signs and symptoms of menopause? During menopause, you may have the following symptoms: Hot flashes. These can be moderate or severe. Night sweats. Decrease in sex drive. Mood swings. Headaches. Tiredness (fatigue). Irritability. Memory problems. Problems falling asleep or staying asleep. Talk with your health care provider about treatment options for your symptoms. Do I need hormone replacement therapy? Hormone replacement therapy is effective in treating symptoms that are caused by menopause, such as hot flashes and night sweats. Hormone replacement carries certain risks, especially as you become older. If you are thinking about using estrogen or estrogen with progestin, discuss the benefits and risks with your health care provider. How can I reduce my risk for heart disease and stroke? The risk of heart disease, heart attack, and stroke increases as you age. One of the causes may be a change in the body's hormones during menopause. This can affect how your body uses dietary fats, triglycerides, and cholesterol. Heart attack and stroke are medical emergencies. There are many things that you can do to help prevent heart disease and stroke. Watch your blood pressure High  blood pressure causes heart disease and increases the risk of stroke. This is more likely to develop in people who have high blood pressure readings or are overweight. Have your blood pressure checked: Every 3-5 years if you are 50-75 years of age. Every year if you are 77 years old or older. Eat a healthy diet  Eat a diet that includes plenty of vegetables, fruits, low-fat dairy products, and lean protein. Do not eat a lot of foods that are high in solid fats, added sugars, or sodium. Get regular exercise Get regular exercise. This is one of the most important things you can do for your health. Most adults should: Try to exercise for at least 150 minutes each week. The exercise should increase your heart rate and make you sweat (moderate-intensity exercise). Try to do strengthening exercises at least twice each week. Do these in addition to the moderate-intensity exercise. Spend less time sitting. Even light physical activity can be beneficial. Other tips Work with your health care provider to achieve or maintain a healthy weight. Do not use any products that contain nicotine or tobacco. These products include cigarettes, chewing tobacco, and vaping devices, such as e-cigarettes. If you need help quitting, ask your health care provider. Know your numbers. Ask your health care provider to check your cholesterol and your blood sugar (glucose). Continue to have your blood tested as directed by your health care provider. Do I need screening for cancer? Depending on your health history and family history, you may need to have cancer screenings at different stages of your life. This may include screening for: Breast cancer. Cervical cancer. Lung cancer. Colorectal cancer. What is my risk for osteoporosis? After menopause, you may be  at increased risk for osteoporosis. Osteoporosis is a condition in which bone destruction happens more quickly than new bone creation. To help prevent osteoporosis or  the bone fractures that can happen because of osteoporosis, you may take the following actions: If you are 61-3 years old, get at least 1,000 mg of calcium and at least 600 international units (IU) of vitamin D per day. If you are older than age 61 but younger than age 75, get at least 1,200 mg of calcium and at least 600 international units (IU) of vitamin D per day. If you are older than age 62, get at least 1,200 mg of calcium and at least 800 international units (IU) of vitamin D per day. Smoking and drinking excessive alcohol increase the risk of osteoporosis. Eat foods that are rich in calcium and vitamin D, and do weight-bearing exercises several times each week as directed by your health care provider. How does menopause affect my mental health? Depression may occur at any age, but it is more common as you become older. Common symptoms of depression include: Feeling depressed. Changes in sleep patterns. Changes in appetite or eating patterns. Feeling an overall lack of motivation or enjoyment of activities that you previously enjoyed. Frequent crying spells. Talk with your health care provider if you think that you are experiencing any of these symptoms. General instructions See your health care provider for regular wellness exams and vaccines. This may include: Scheduling regular health, dental, and eye exams. Getting and maintaining your vaccines. These include: Influenza vaccine. Get this vaccine each year before the flu season begins. Pneumonia vaccine. Shingles vaccine. Tetanus, diphtheria, and pertussis (Tdap) booster vaccine. Your health care provider may also recommend other immunizations. Tell your health care provider if you have ever been abused or do not feel safe at home. Summary Menopause is a normal process in which your ability to get pregnant comes to an end. This condition causes hot flashes, night sweats, decreased interest in sex, mood swings, headaches, or lack  of sleep. Treatment for this condition may include hormone replacement therapy. Take actions to keep yourself healthy, including exercising regularly, eating a healthy diet, watching your weight, and checking your blood pressure and blood sugar levels. Get screened for cancer and depression. Make sure that you are up to date with all your vaccines. This information is not intended to replace advice given to you by your health care provider. Make sure you discuss any questions you have with your health care provider. Document Revised: 08/03/2020 Document Reviewed: 08/03/2020 Elsevier Patient Education  2024 [employer].

## 2023-05-20 ENCOUNTER — Encounter: Payer: Self-pay | Admitting: Internal Medicine

## 2023-05-20 ENCOUNTER — Other Ambulatory Visit: Payer: Self-pay

## 2023-05-20 LAB — LIPID PANEL
Cholesterol: 154 mg/dL (ref <200)
HDL: 52 mg/dL (ref >=50)
LDL Cholesterol (Calc): 83 mg/dL
Non-HDL Cholesterol (Calc): 102 mg/dL (ref <130)
Total CHOL/HDL Ratio: 3 (calc) (ref <5.0)
Triglycerides: 92 mg/dL (ref <150)

## 2023-05-20 LAB — HEMOGLOBIN A1C
Hgb A1c MFr Bld: 5.5 %{Hb} (ref <5.7)
Mean Plasma Glucose: 111 mg/dL
eAG (mmol/L): 6.2 mmol/L

## 2023-05-23 ENCOUNTER — Inpatient Hospital Stay: Payer: 59

## 2023-05-23 VITALS — BP 122/67 | HR 59 | Temp 96.9°F | Resp 18

## 2023-05-23 DIAGNOSIS — Z5112 Encounter for antineoplastic immunotherapy: Secondary | ICD-10-CM | POA: Diagnosis not present

## 2023-05-23 DIAGNOSIS — C50912 Malignant neoplasm of unspecified site of left female breast: Secondary | ICD-10-CM

## 2023-05-23 MED ORDER — SODIUM CHLORIDE 0.9 % IV SOLN
Freq: Once | INTRAVENOUS | Status: AC
  Filled 2023-05-23: qty 250

## 2023-05-23 MED ORDER — DIPHENHYDRAMINE HCL 25 MG PO CAPS
50.0000 mg | ORAL_CAPSULE | Freq: Once | ORAL | Status: AC
  Administered 2023-05-23: 50 mg via ORAL
  Filled 2023-05-23: qty 2

## 2023-05-23 MED ORDER — HEPARIN SOD (PORK) LOCK FLUSH 100 UNIT/ML IV SOLN
500.0000 [IU] | Freq: Once | INTRAVENOUS | Status: AC | PRN
  Administered 2023-05-23: 500 [IU]
  Filled 2023-05-23: qty 5

## 2023-05-23 MED ORDER — ACETAMINOPHEN 325 MG PO TABS
650.0000 mg | ORAL_TABLET | Freq: Once | ORAL | Status: AC
  Administered 2023-05-23: 650 mg via ORAL
  Filled 2023-05-23: qty 2

## 2023-05-23 MED ORDER — TRASTUZUMAB-ANNS CHEMO 150 MG IV SOLR
6.0000 mg/kg | Freq: Once | INTRAVENOUS | Status: AC
  Administered 2023-05-23: 300 mg via INTRAVENOUS
  Filled 2023-05-23: qty 14.29

## 2023-05-23 NOTE — Patient Instructions (Signed)
 [facility] - A DEPT OF [facility]  Discharge Instructions: Thank you for choosing [facility] to provide your oncology and hematology care.  If you have a lab appointment with the Cancer Center, please go directly to the Cancer Center and check in at the registration area.  Wear comfortable clothing and clothing appropriate for easy access to any Portacath or PICC line.   We strive to give you quality time with your provider. You may need to reschedule your appointment if you arrive late (15 or more minutes).  Arriving late affects you and other patients whose appointments are after yours.  Also, if you miss three or more appointments without notifying the office, you may be dismissed from the clinic at the provider's discretion.      For prescription refill requests, have your pharmacy contact our office and allow 72 hours for refills to be completed.    Today you received the following chemotherapy and/or immunotherapy agents KANJINITI       To help prevent nausea and vomiting after your treatment, we encourage you to take your nausea medication as directed.  BELOW ARE SYMPTOMS THAT SHOULD BE REPORTED IMMEDIATELY: *FEVER GREATER THAN 100.4 F (38 C) OR HIGHER *CHILLS OR SWEATING *NAUSEA AND VOMITING THAT IS NOT CONTROLLED WITH YOUR NAUSEA MEDICATION *UNUSUAL SHORTNESS OF BREATH *UNUSUAL BRUISING OR BLEEDING *URINARY PROBLEMS (pain or burning when urinating, or frequent urination) *BOWEL PROBLEMS (unusual diarrhea, constipation, pain near the anus) TENDERNESS IN MOUTH AND THROAT WITH OR WITHOUT PRESENCE OF ULCERS (sore throat, sores in mouth, or a toothache) UNUSUAL RASH, SWELLING OR PAIN  UNUSUAL VAGINAL DISCHARGE OR ITCHING   Items with * indicate a potential emergency and should be followed up as soon as possible or go to the Emergency Department if any problems should occur.  Please show the CHEMOTHERAPY ALERT CARD or IMMUNOTHERAPY  ALERT CARD at check-in to the Emergency Department and triage nurse.  Should you have questions after your visit or need to cancel or reschedule your appointment, please contact [facility] - A DEPT OF [facility]  [phone] and follow the prompts.  Office hours are 8:00 a.m. to 4:30 p.m. Monday - Friday. Please note that voicemails left after 4:00 p.m. may not be returned until the following business day.  We are closed weekends and major holidays. You have access to a nurse at all times for urgent questions. Please call the main number to the clinic [phone] and follow the prompts.  For any non-urgent questions, you may also contact your provider using MyChart. We now offer e-Visits for anyone 2 and older to request care online for non-urgent symptoms. For details visit [email or web address].   Also download the MyChart app! Go to the app store, search "MyChart", open the app, select [facility], and log in with your MyChart username and password.  Trastuzumab Injection What is this medication? TRASTUZUMAB (tras TOO zoo mab) treats breast cancer and stomach cancer. It works by blocking a protein that causes cancer cells to grow and multiply. This helps to slow or stop the spread of cancer cells. This medicine may be used for other purposes; ask your health care provider or pharmacist if you have questions. COMMON BRAND NAME(S): Herceptin, HERCESSI, Herzuma, KANJINTI, Ogivri, Ontruzant, Trazimera What should I tell my care team before I take this medication? They need to know if you have any of these conditions: Heart failure Lung  disease An unusual or allergic reaction to trastuzumab, other medications, foods, dyes, or preservatives Pregnant or trying to get pregnant Breast-feeding How should I use this medication? This medication is injected into a vein. It is given by your care team in a hospital or clinic setting. Talk to your care team about the  use of this medication in children. It is not approved for use in children. Overdosage: If you think you have taken too much of this medicine contact a poison control center or emergency room at once. NOTE: This medicine is only for you. Do not share this medicine with others. What if I miss a dose? Keep appointments for follow-up doses. It is important not to miss your dose. Call your care team if you are unable to keep an appointment. What may interact with this medication? Certain types of chemotherapy, such as daunorubicin, doxorubicin, epirubicin, idarubicin This list may not describe all possible interactions. Give your health care provider a list of all the medicines, herbs, non-prescription drugs, or dietary supplements you use. Also tell them if you smoke, drink alcohol, or use illegal drugs. Some items may interact with your medicine. What should I watch for while using this medication? Your condition will be monitored carefully while you are receiving this medication. This medication may make you feel generally unwell. This is not uncommon, as chemotherapy affects healthy cells as well as cancer cells. Report any side effects. Continue your course of treatment even though you feel ill unless your care team tells you to stop. This medication may increase your risk of getting an infection. Call your care team for advice if you get a fever, chills, sore throat, or other symptoms of a cold or flu. Do not treat yourself. Try to avoid being around people who are sick. Avoid taking medications that contain aspirin, acetaminophen, ibuprofen, naproxen, or ketoprofen unless instructed by your care team. These medications can hide a fever. Talk to your care team if you may be pregnant. Serious birth defects can occur if you take this medication during pregnancy and for 7 months after the last dose. You will need a negative pregnancy test before starting this medication. Contraception is recommended  while taking this medication and for 7 months after the last dose. Your care team can help you find the option that works for you. Do not breastfeed while taking this medication and for 7 months after stopping treatment. What side effects may I notice from receiving this medication? Side effects that you should report to your care team as soon as possible: Allergic reactions or angioedema--skin rash, itching or hives, swelling of the face, eyes, lips, tongue, arms, or legs, trouble swallowing or breathing Dry cough, shortness of breath or trouble breathing Heart failure--shortness of breath, swelling of the ankles, feet, or hands, sudden weight gain, unusual weakness or fatigue Infection--fever, chills, cough, or sore throat Infusion reactions--chest pain, shortness of breath or trouble breathing, feeling faint or lightheaded Side effects that usually do not require medical attention (report to your care team if they continue or are bothersome): Diarrhea Dizziness Headache Nausea Trouble sleeping Vomiting This list may not describe all possible side effects. Call your doctor for medical advice about side effects. You may report side effects to FDA at 1-800-FDA-1088. Where should I keep my medication? This medication is given in a hospital or clinic. It will not be stored at home. NOTE: This sheet is a summary. It may not cover all possible information. If you have questions about  this medicine, talk to your doctor, pharmacist, or health care provider.  2024 Elsevier/Gold Standard (2021-07-27 00:00:00)

## 2023-06-01 ENCOUNTER — Encounter: Payer: Self-pay | Admitting: Internal Medicine

## 2023-06-07 ENCOUNTER — Ambulatory Visit
Admission: RE | Admit: 2023-06-07 | Discharge: 2023-06-07 | Disposition: A | Discharge status: Home / Self Care | Payer: 59 | Source: Ambulatory Visit | Attending: Internal Medicine | Admitting: Internal Medicine

## 2023-06-07 DIAGNOSIS — I080 Rheumatic disorders of both mitral and aortic valves: Secondary | ICD-10-CM | POA: Insufficient documentation

## 2023-06-07 DIAGNOSIS — Z79899 Other long term (current) drug therapy: Secondary | ICD-10-CM | POA: Diagnosis not present

## 2023-06-07 DIAGNOSIS — Z171 Estrogen receptor negative status [ER-]: Secondary | ICD-10-CM | POA: Diagnosis not present

## 2023-06-07 DIAGNOSIS — C171 Malignant neoplasm of jejunum: Secondary | ICD-10-CM | POA: Insufficient documentation

## 2023-06-07 DIAGNOSIS — Z5181 Encounter for therapeutic drug level monitoring: Secondary | ICD-10-CM | POA: Diagnosis not present

## 2023-06-07 DIAGNOSIS — C50912 Malignant neoplasm of unspecified site of left female breast: Secondary | ICD-10-CM | POA: Insufficient documentation

## 2023-06-07 DIAGNOSIS — F419 Anxiety disorder, unspecified: Secondary | ICD-10-CM | POA: Diagnosis not present

## 2023-06-07 LAB — ECHOCARDIOGRAM COMPLETE
Area-P 1/2: 3.79 cm2
S' Lateral: 3.1 cm

## 2023-06-07 NOTE — Progress Notes (Signed)
*  PRELIMINARY RESULTS* Echocardiogram 2D Echocardiogram has been performed.  Cristela Blue 06/07/2023, 10:08 AM

## 2023-06-13 ENCOUNTER — Inpatient Hospital Stay: Payer: 59

## 2023-06-13 ENCOUNTER — Encounter: Payer: Self-pay | Admitting: Internal Medicine

## 2023-06-13 ENCOUNTER — Inpatient Hospital Stay: Payer: 59 | Attending: Internal Medicine | Admitting: Internal Medicine

## 2023-06-13 VITALS — BP 133/63

## 2023-06-13 VITALS — BP 104/69 | HR 61 | Temp 97.0°F | Resp 18 | Wt 116.0 lb

## 2023-06-13 DIAGNOSIS — Z5112 Encounter for antineoplastic immunotherapy: Secondary | ICD-10-CM | POA: Insufficient documentation

## 2023-06-13 DIAGNOSIS — C50012 Malignant neoplasm of nipple and areola, left female breast: Secondary | ICD-10-CM | POA: Insufficient documentation

## 2023-06-13 DIAGNOSIS — F419 Anxiety disorder, unspecified: Secondary | ICD-10-CM | POA: Insufficient documentation

## 2023-06-13 DIAGNOSIS — Z5181 Encounter for therapeutic drug level monitoring: Secondary | ICD-10-CM

## 2023-06-13 DIAGNOSIS — C50912 Malignant neoplasm of unspecified site of left female breast: Secondary | ICD-10-CM | POA: Diagnosis not present

## 2023-06-13 DIAGNOSIS — Z171 Estrogen receptor negative status [ER-]: Secondary | ICD-10-CM | POA: Diagnosis not present

## 2023-06-13 DIAGNOSIS — Z79899 Other long term (current) drug therapy: Secondary | ICD-10-CM | POA: Diagnosis not present

## 2023-06-13 DIAGNOSIS — Z923 Personal history of irradiation: Secondary | ICD-10-CM | POA: Insufficient documentation

## 2023-06-13 LAB — COMPREHENSIVE METABOLIC PANEL
ALT: 14 U/L (ref 0–44)
AST: 18 U/L (ref 15–41)
Albumin: 3.9 g/dL (ref 3.5–5.0)
Alkaline Phosphatase: 45 U/L (ref 38–126)
Anion gap: 9 (ref 5–15)
BUN: 16 mg/dL (ref 8–23)
CO2: 24 mmol/L (ref 22–32)
Calcium: 8.5 mg/dL — ABNORMAL LOW (ref 8.9–10.3)
Chloride: 104 mmol/L (ref 98–111)
Creatinine, Ser: 0.85 mg/dL (ref 0.44–1.00)
GFR, Estimated: >60 mL/min (ref >60)
Glucose, Bld: 104 mg/dL — ABNORMAL HIGH (ref 70–99)
Potassium: 3.7 mmol/L (ref 3.5–5.1)
Sodium: 137 mmol/L (ref 135–145)
Total Bilirubin: 0.6 mg/dL (ref 0.0–1.2)
Total Protein: 6.5 g/dL (ref 6.5–8.1)

## 2023-06-13 LAB — CBC WITH DIFFERENTIAL/PLATELET
Abs Immature Granulocytes: 0.01 10*3/uL (ref 0.00–0.07)
Basophils Absolute: 0 10*3/uL (ref 0.0–0.1)
Basophils Relative: 1 %
Eosinophils Absolute: 0.2 10*3/uL (ref 0.0–0.5)
Eosinophils Relative: 4 %
HCT: 40.1 % (ref 36.0–46.0)
Hemoglobin: 13.2 g/dL (ref 12.0–15.0)
Immature Granulocytes: 0 %
Lymphocytes Relative: 25 %
Lymphs Abs: 1.4 10*3/uL (ref 0.7–4.0)
MCH: 29.1 pg (ref 26.0–34.0)
MCHC: 32.9 g/dL (ref 30.0–36.0)
MCV: 88.3 fL (ref 80.0–100.0)
Monocytes Absolute: 0.5 10*3/uL (ref 0.1–1.0)
Monocytes Relative: 8 %
Neutro Abs: 3.6 10*3/uL (ref 1.7–7.7)
Neutrophils Relative %: 62 %
Platelets: 205 10*3/uL (ref 150–400)
RBC: 4.54 MIL/uL (ref 3.87–5.11)
RDW: 12.5 % (ref 11.5–15.5)
WBC: 5.7 10*3/uL (ref 4.0–10.5)
nRBC: 0 % (ref 0.0–0.2)

## 2023-06-13 MED ORDER — DIPHENHYDRAMINE HCL 25 MG PO CAPS
50.0000 mg | ORAL_CAPSULE | Freq: Once | ORAL | Status: AC
  Administered 2023-06-13: 50 mg via ORAL
  Filled 2023-06-13: qty 2

## 2023-06-13 MED ORDER — TRASTUZUMAB-ANNS CHEMO 150 MG IV SOLR
6.0000 mg/kg | Freq: Once | INTRAVENOUS | Status: AC
  Administered 2023-06-13: 300 mg via INTRAVENOUS
  Filled 2023-06-13: qty 14.29

## 2023-06-13 MED ORDER — SODIUM CHLORIDE 0.9 % IV SOLN
Freq: Once | INTRAVENOUS | Status: AC
  Filled 2023-06-13: qty 250

## 2023-06-13 MED ORDER — ACETAMINOPHEN 325 MG PO TABS
650.0000 mg | ORAL_TABLET | Freq: Once | ORAL | Status: AC
  Administered 2023-06-13: 650 mg via ORAL
  Filled 2023-06-13: qty 2

## 2023-06-13 MED ORDER — HEPARIN SOD (PORK) LOCK FLUSH 100 UNIT/ML IV SOLN
500.0000 [IU] | Freq: Once | INTRAVENOUS | Status: AC | PRN
  Administered 2023-06-13: 500 [IU]
  Filled 2023-06-13: qty 5

## 2023-06-13 NOTE — Progress Notes (Signed)
 [facility] CONSULT NOTE  Patient Care Team: Lorre Munroe, NP as PCP - General (Internal Medicine) Iran Ouch, MD as PCP - Cardiology (Cardiology) Hulen Luster, RN as Oncology Nurse Navigator Michaelyn Barter, MD as Consulting Physician (Oncology) Carmina Miller, MD as Consulting Physician (Radiation Oncology)   CANCER STAGING   Cancer Staging  Breast cancer [facility]) Staging form: Breast, AJCC 8th Edition - Clinical stage from 05/17/2022: Stage IA (cT1b, cN0, cM0, G3, ER-, PR-, HER2+) - Signed by Michaelyn Barter, MD on 05/27/2022 Stage prefix: Initial diagnosis Histologic grading system: 3 grade system  Current treatment Weekly paclitaxel with Herceptin (APT regimen) started on 07/18/2022  ASSESSMENT & PLAN:  Jennifer Garrison 63 y.o. female with pmh of osteoporosis and anxiety was referred to medical oncology for management of left breast stage IA invasive ductal cancer.  # Left breast IDC stage IA, ER/PR negative, HER2 positive # Encounter for cardiac monitoring -Patient felt a palpable mass in her left breast.   -S/p left breast lumpectomy with SLNB by Dr. Tonna Boehringer on 06/09/2022. Pathology showed 1.4 cm IDC, grade 3, DCIS present grade 2-3 with comedonecrosis, 0/3 lymph nodes negative, margins negative, pT1c.  ER/PR negative.  HER2 positive  -Completed weekly Taxol and Heceptin x 12 on 10/03/2022. Now on Herceptin every 3 weeks for total of 1 year.   -Adjuvant radiation completed on 11/22/2022.  -Last mammogram from 10/05/2022 was negative.  Repeat in 1 year.  -Labs reviewed and acceptable for treatment.  Will proceed with Herceptin maintenance 6 mg/kg every 3 weeks.  She will come in in 3 weeks for infusion only.  I will follow-up with her in 6 weeks.  She will complete Herceptin maintenance on July 25, 2023.   # Trastuzumab induced cardiotoxicity -Recovered.  Will resume Herceptin. -Echo from 02/10/2023 showed decrease in the ejection fraction from 55 to 60% to 45  to 50%.  The left ventricle demonstrated global hypokinesis.  Grade 1 diastolic Dysfunction.  Herceptin was placed on hold. -Seen by Dr. Kirke Corin, cardiologist -considering patient is asymptomatic there is no indication for cardiac medications.  Also with the echo reading there was concern for some interpersonal variation.  # Anxiety -Worsened with the need of chemotherapy. -Continue with Ativan 0.5 mg every 8 hours as needed.  On Wellbutrin.  # Evaluated by genetics-testing negative  # Access-port.  Placed by Dr. Tonna Boehringer on 06/30/2022.  # Osteoporosis -On alendronate  Orders Placed This Encounter  Procedures   CBC with Differential/Platelet    Standing Status:   Future    Expected Date:   07/25/2023    Expiration Date:   06/12/2024   Comprehensive metabolic panel    Standing Status:   Future    Expected Date:   07/25/2023    Expiration Date:   06/12/2024   Rest as scheduled.  The total time spent in the appointment was 30 minutes encounter with patients including review of chart and various tests results, discussions about plan of care and coordination of care plan   All questions were answered. The patient knows to call the clinic with any problems, questions or concerns. No barriers to learning was detected.  Michaelyn Barter, MD 3/18/202510:41 AM   HISTORY OF PRESENTING ILLNESS:  Jennifer Garrison 63 y.o. female with pmh of osteoporosis on alendronate and anxiety was referred to medical oncology for further management of stage Ia left breast invasive ductal cancer HER2 positive  Patient felt a palpable mass in her left breast mid February 2024.  She sought medical attention from PCP.  Further imaging as below.  INTERVAL HISTORY- Patient seen today as follow-up for her maintenance Herceptin. She reports feeling well overall.  Denies any shortness of breath, chest pain, dizziness.  Gets tired easily on exertion.   I have reviewed her chart and materials related to her cancer extensively  and collaborated history with the patient. Summary of oncologic history is as follows: Oncology History  Breast cancer (HCC)  05/11/2022 Mammogram   Diagnostic mammogram and ultrasound- FINDINGS: Full field and spot compression views of the LEFT breast demonstrate a 1 cm irregular mass within the UPPER INNER LEFT breast, at the site of patient concern. The patient has retropectoral implants.   On physical exam, a firm palpable mass identified at the 11 o'clock position of the LEFT breast 8 cm from the nipple.   Targeted ultrasound is performed, showing a 0.9 x 0.8 x 1 cm irregular hypoechoic mass at the 11 o'clock position of the LEFT breast 8 cm from the nipple.   No abnormal LEFT axillary lymph nodes are noted.   IMPRESSION: 1. Suspicious 1 cm UPPER INNER LEFT breast mass. Tissue sampling is recommended. 2. No abnormal appearing LEFT axillary lymph nodes.   05/17/2022 Cancer Staging   Staging form: Breast, AJCC 8th Edition - Clinical stage from 05/17/2022: Stage IA (cT1b, cN0, cM0, G3, ER-, PR-, HER2+) - Signed by Michaelyn Barter, MD on 05/27/2022 Stage prefix: Initial diagnosis Histologic grading system: 3 grade system   05/17/2022 Initial Biopsy   DIAGNOSIS:  A. BREAST, LEFT, 11:00, 8 CM FROM NIPPLE, ULTRASOUND-GUIDED BIOPSY:   - INVASIVE MAMMARY CARCINOMA, NOS DUCTAL.   Size of invasive carcinoma: 10 mm in this sample  Histologic grade of invasive carcinoma: Grade 3                       Glandular/tubular differentiation score: 3                       Nuclear pleomorphism score: 3                       Mitotic rate score: 2                       Total score: 8  Ductal carcinoma in situ: Not identified  Lymphovascular invasion: Not identified   Estrogen Receptor (ER) Status: NEGATIVE (LESS THAN 1%)   Progesterone Receptor (PgR) Status: NEGATIVE (LESS THAN 1%)   HER2 (by immunohistochemistry): POSITIVE (Score 3+)       Percentage of cells with uniform intense complete  membrane  staining: 80%  Ki-67: Not performed     Genetic Testing   No pathogenic variants identified on the [facility] Multi-Cancer+RNA panel. VUS in MUTYH called c.1484G>A identified. The report date is 06/09/2022.  The Multi-Cancer + RNA Panel offered by [facility] includes sequencing and/or deletion/duplication analysis of the following 70 genes:  AIP*, ALK, APC*, ATM*, AXIN2*, BAP1*, BARD1*, BLM*, BMPR1A*, BRCA1*, BRCA2*, BRIP1*, CDC73*, CDH1*, CDK4, CDKN1B*, CDKN2A, CHEK2*, CTNNA1*, DICER1*, EPCAM, EGFR, FH*, FLCN*, GREM1, HOXB13, KIT, LZTR1, MAX*, MBD4, MEN1*, MET, MITF, MLH1*, MSH2*, MSH3*, MSH6*, MUTYH*, NF1*, NF2*, NTHL1*, PALB2*, PDGFRA, PMS2*, POLD1*, POLE*, POT1*, PRKAR1A*, PTCH1*, PTEN*, RAD51C*, RAD51D*, RB1*, RET, SDHA*, SDHAF2*, SDHB*, SDHC*, SDHD*, SMAD4*, SMARCA4*, SMARCB1*, SMARCE1*, STK11*, SUFU*, TMEM127*, TP53*, TSC1*, TSC2*, VHL*. RNA analysis is performed for * genes.   06/09/2022 Surgery   S/p left breast lumpectomy  with SLNB by Dr. Tonna Boehringer  Pathology showed 1.4 cm IDC, grade 3, DCIS present grade 2-3 with comedonecrosis, 0/3 lymph nodes negative, margins negative, pT1c.  ER/PR negative.  HER2 positive   07/18/2022 -  Chemotherapy   Patient is on Treatment Plan : BREAST Paclitaxel + Trastuzumab q7d / Trastuzumab q21d      Menarche age 71 or 80 Age at first birth 41 Used birth control pills and Depo more than 5 years Menopause was age 56 Hysterectomy yes for prolapsed uterus HRT no History of breast biopsies yes for breast cyst in left lower Family history of lung cancer in mother who was smoker  MEDICAL HISTORY:  Past Medical History:  Diagnosis Date   Allergy 1970   Anxiety 2000   Arthritis 2021   Breast cancer (HCC) 05/27/2022   Cancer (HCC) 05/11/2022   Breast stage A1   Depression 2000   IBS (irritable bowel syndrome)    Trigger finger    Left Middle Finger 11/2016    SURGICAL HISTORY: Past Surgical History:  Procedure Laterality Date   ABDOMINAL  HYSTERECTOMY  02/2016   Only cervix remains (done for prolapse)   ABDOMINAL HYSTERECTOMY  2017   AUGMENTATION MAMMAPLASTY Bilateral 2010   BREAST BIOPSY Left 05/17/2022   [place] BX, ribbon clip, path pending   BREAST BIOPSY Left 05/17/2022   [place] LT BREAST BX W LOC DEV 1ST LESION IMG BX SPEC US GUIDE 05/17/2022 [facility]   BREAST CYST ASPIRATION Left 2010   neg   BREAST ENHANCEMENT SURGERY  1998   CHOLECYSTECTOMY  1991   COLONOSCOPY WITH PROPOFOL N/A 10/10/2019   Procedure: COLONOSCOPY WITH PROPOFOL;  Surgeon: Wyline Mood, MD;  Location: [facility] ENDOSCOPY;  Service: Gastroenterology;  Laterality: N/A;   PARTIAL MASTECTOMY WITH AXILLARY SENTINEL LYMPH NODE BIOPSY Left 06/09/2022   Procedure: PARTIAL MASTECTOMY WITH AXILLARY SENTINEL LYMPH NODE BIOPSY;  Surgeon: Sung Amabile, DO;  Location: [facility] ORS;  Service: General;  Laterality: Left;   PORTACATH PLACEMENT N/A 06/30/2022   Procedure: INSERTION PORT-A-CATH;  Surgeon: Sung Amabile, DO;  Location: [facility] ORS;  Service: General;  Laterality: N/A;   TYMPANOPLASTY Left     SOCIAL HISTORY: Social History   Socioeconomic History   Marital status: Married    Spouse name: Onalee Hua   Number of children: 1   Years of education: Not on file   Highest education level: Associate degree: occupational, Scientist, product/process development, or vocational program  Occupational History   Not on file  Tobacco Use   Smoking status: Former    Current packs/day: 0.00    Types: Cigarettes    Quit date: 09/09/1992    Years since quitting: 30.7   Smokeless tobacco: Current    Types: Chew   Tobacco comments:    Got pregnant  Vaping Use   Vaping status: Every Day  Substance and Sexual Activity   Alcohol use: Not Currently   Drug use: Yes    Types: Marijuana    Comment: daily   Sexual activity: Yes    Birth control/protection: Post-menopausal  Other Topics Concern   Not on file  Social History Narrative   Not on file   Social Drivers of Health   Financial Resource Strain:  Low Risk  (05/12/2023)   Overall Financial Resource Strain (CARDIA)    Difficulty of Paying Living Expenses: Not hard at all  Food Insecurity: No Food Insecurity (05/12/2023)   Hunger Vital Sign    Worried About Running Out of Food in the Last Year: Never true  Ran Out of Food in the Last Year: Never true  Transportation Needs: No Transportation Needs (05/12/2023)   PRAPARE - Administrator, Civil Service (Medical): No    Lack of Transportation (Non-Medical): No  Physical Activity: Sufficiently Active (05/12/2023)   Exercise Vital Sign    Days of Exercise per Week: 7 days    Minutes of Exercise per Session: 60 min  Stress: No Stress Concern Present (05/12/2023)   [employer] of Occupational Health - Occupational Stress Questionnaire    Feeling of Stress : Only a little  Social Connections: Moderately Integrated (05/12/2023)   Social Connection and Isolation Panel [NHANES]    Frequency of Communication with Friends and Family: Three times a week    Frequency of Social Gatherings with Friends and Family: Once a week    Attends Religious Services: More than 4 times per year    Active Member of [employer] or Organizations: No    Attends Engineer, structural: Not on file    Marital Status: Married  Catering manager Violence: Not At Risk (05/27/2022)   Humiliation, Afraid, Rape, and Kick questionnaire    Fear of Current or Ex-Partner: No    Emotionally Abused: No    Physically Abused: No    Sexually Abused: No    FAMILY HISTORY: Family History  Problem Relation Age of Onset   Lung cancer Mother 79   Hypertension Mother    Cancer Mother    Lupus Father    Leukemia Cousin 6   Early death Sister    Breast cancer Neg Hx     ALLERGIES:  is allergic to poison ivy extract.  MEDICATIONS:  Current Outpatient Medications  Medication Sig Dispense Refill   ADVANCED FIBER COMPLEX PO Take 3 tablets by mouth daily. Gummies     alendronate (FOSAMAX) 70 MG tablet Take 70  mg by mouth once a week.     buPROPion (WELLBUTRIN XL) 150 MG 24 hr tablet Take 1 tablet (150 mg total) by mouth daily. 90 tablet 1   busPIRone (BUSPAR) 5 MG tablet Take 1 tablet (5 mg total) by mouth 2 (two) times daily as needed. 180 tablet 1   Calcium Carb-Cholecalciferol (CALCIUM 600 + D) 600-5 MG-MCG TABS 1 orally twice a day     dicyclomine (BENTYL) 10 MG capsule Take 1 capsule (10 mg total) by mouth 3 (three) times daily as needed for up to 10 days for spasms. 30 capsule 0   HYDROcodone-acetaminophen (NORCO) 5-325 MG tablet Take 1 tablet by mouth every 6 (six) hours as needed for up to 6 doses for moderate pain. 6 tablet 0   ibuprofen (ADVIL) 600 MG tablet Take 1 tablet (600 mg total) by mouth every 6 (six) hours as needed. 30 tablet 0   lidocaine-prilocaine (EMLA) cream Apply to affected area once 30 g 3   LORazepam (ATIVAN) 0.5 MG tablet Take 1 tablet (0.5 mg total) by mouth every 8 (eight) hours. 30 tablet 0   magic mouthwash (multi-ingredient) oral suspension Take 5 mLs by mouth 4 (four) times daily as needed. 300 mL 0   metroNIDAZOLE (METROCREAM) 0.75 % cream Apply topically as needed.     mupirocin cream (BACTROBAN) 2 % Apply 1 Application topically 2 (two) times daily. Apply to skin of nails twice a day for chemotherapy nail changes. 30 g 0   ondansetron (ZOFRAN) 8 MG tablet Take 1 tablet (8 mg total) by mouth every 8 (eight) hours as needed for nausea or vomiting.  30 tablet 1   OVER THE COUNTER MEDICATION 1.5 tablets in the morning and at bedtime. Calcium Bone Strength Supplement1     OVER THE COUNTER MEDICATION 2 tablets daily. Vitex Berry     OVER THE COUNTER MEDICATION in the morning, at noon, and at bedtime. Milk Thisle Seeds Tea     oxybutynin (DITROPAN XL) 5 MG 24 hr tablet Take 1 tablet (5 mg total) by mouth at bedtime. 90 tablet 1   prochlorperazine (COMPAZINE) 10 MG tablet Take 1 tablet (10 mg total) by mouth every 6 (six) hours as needed for nausea or vomiting. 30 tablet 1    triamcinolone cream (KENALOG) 0.1 % Apply 1 Application topically 2 (two) times daily. Apply to nail plates twice a day for nail lifting due to chemotherapy. 28 g 0   No current facility-administered medications for this visit.   Facility-Administered Medications Ordered in Other Visits  Medication Dose Route Frequency Provider Last Rate Last Admin   heparin lock flush 100 unit/mL  500 Units Intracatheter Once PRN Michaelyn Barter, MD       trastuzumab-anns [facility]) 300 mg in sodium chloride 0.9 % 250 mL chemo infusion  6 mg/kg (Treatment Plan Recorded) Intravenous Once Michaelyn Barter, MD 528.6 mL/hr at 06/13/23 1018 300 mg at 06/13/23 1018    REVIEW OF SYSTEMS:   Pertinent information mentioned in HPI All other systems were reviewed with the patient and are negative.  PHYSICAL EXAMINATION: ECOG PERFORMANCE STATUS: 0 - Asymptomatic  Vitals:   06/13/23 0901  BP: 104/69  Pulse: 61  Resp: 18  Temp: (!) 97 F (36.1 C)  SpO2: 98%         Filed Weights   06/13/23 0901  Weight: 116 lb (52.6 kg)         GENERAL:alert, no distress and comfortable SKIN: skin color, texture, turgor are normal, no rashes or significant lesions EYES: normal, conjunctiva are pink and non-injected, sclera clear OROPHARYNX:no exudate, no erythema and lips, buccal mucosa, and tongue normal  NECK: supple, thyroid normal size, non-tender, without nodularity LYMPH:  no palpable lymphadenopathy in the cervical, axillary or inguinal LUNGS: clear to auscultation and percussion with normal breathing effort HEART: regular rate & rhythm and no murmurs and no lower extremity edema ABDOMEN:abdomen soft, non-tender and normal bowel sounds Musculoskeletal:no cyanosis of digits and no clubbing  PSYCH: alert & oriented x 3 with fluent speech NEURO: no focal motor/sensory deficits  LABORATORY DATA:  I have reviewed the data as listed Lab Results  Component Value Date   WBC 5.7 06/13/2023   HGB 13.2  06/13/2023   HCT 40.1 06/13/2023   MCV 88.3 06/13/2023   PLT 205 06/13/2023   Recent Labs    03/21/23 0956 05/02/23 0847 06/13/23 0839  NA 138 139 137  K 3.9 3.8 3.7  CL 104 106 104  CO2 25 25 24   GLUCOSE 99 109* 104*  BUN 14 13 16   CREATININE 0.83 0.85 0.85  CALCIUM 8.6* 9.0 8.5*  GFRNONAA >60 >60 >60  PROT 6.6 7.0 6.5  ALBUMIN 3.4* 3.9 3.9  AST 19 19 18   ALT 15 15 14   ALKPHOS 41 47 45  BILITOT 1.0 0.9 0.6    RADIOGRAPHIC STUDIES: I have personally reviewed the radiological images as listed and agreed with the findings in the report. ECHOCARDIOGRAM COMPLETE Result Date: 06/07/2023    ECHOCARDIOGRAM REPORT   Patient Name:   MICHEALA MORISSETTE Date of Exam: 06/07/2023 Medical Rec #:  [id number]  Height:       64.0 in Accession #:    [id number]   Weight:       116.2 lb Date of Birth:  1960/04/30     BSA:          1.553 m Patient Age:    62 years     BP:           122/67 mmHg Patient Gender: F            HR:           59 bpm. Exam Location:  [facility] Procedure: 2D Echo, Cardiac Doppler, Color Doppler, Strain Analysis and 3D Echo            (Both Spectral and Color Flow Doppler were utilized during            procedure). Indications:     Chemo Z09  History:         Patient has prior history of Echocardiogram examinations, most                  recent 03/16/2023. Anxiety ,breast cancer.  Sonographer:     Cristela Blue Referring Phys:  [id number] Michaelyn Barter Diagnosing Phys: Julien Nordmann MD  Sonographer Comments: Global longitudinal strain was attempted. IMPRESSIONS  1. Left ventricular ejection fraction, by estimation, is 50 to 55%. Left ventricular ejection fraction by 3D volume is 54 %. The left ventricle has low normal function. The left ventricle has no regional wall motion abnormalities. Left ventricular diastolic parameters are indeterminate. The average left ventricular global longitudinal strain is -20.3 %. The global longitudinal strain is normal.  2. Right ventricular systolic function is  normal. The right ventricular size is normal.  3. The mitral valve is normal in structure. Mild mitral valve regurgitation. No evidence of mitral stenosis.  4. The aortic valve is tricuspid. Aortic valve regurgitation is mild. Aortic valve sclerosis is present, with no evidence of aortic valve stenosis.  5. The inferior vena cava is normal in size with greater than 50% respiratory variability, suggesting right atrial pressure of 3 mmHg. FINDINGS  Left Ventricle: Left ventricular ejection fraction, by estimation, is 50 to 55%. Left ventricular ejection fraction by 3D volume is 54 %. The left ventricle has low normal function. The left ventricle has no regional wall motion abnormalities. The average left ventricular global longitudinal strain is -20.3 %. Strain was performed and the global longitudinal strain is normal. The left ventricular internal cavity size was normal in size. There is no left ventricular hypertrophy. Left ventricular diastolic parameters are indeterminate. Right Ventricle: The right ventricular size is normal. No increase in right ventricular wall thickness. Right ventricular systolic function is normal. Left Atrium: Left atrial size was normal in size. Right Atrium: Right atrial size was normal in size. Pericardium: There is no evidence of pericardial effusion. Mitral Valve: The mitral valve is normal in structure. Mild mitral valve regurgitation. No evidence of mitral valve stenosis. MV peak gradient, 2.7 mmHg. The mean mitral valve gradient is 1.0 mmHg. Tricuspid Valve: The tricuspid valve is normal in structure. Tricuspid valve regurgitation is mild . No evidence of tricuspid stenosis. Aortic Valve: The aortic valve is tricuspid. Aortic valve regurgitation is mild. Aortic valve sclerosis is present, with no evidence of aortic valve stenosis. Pulmonic Valve: The pulmonic valve was normal in structure. Pulmonic valve regurgitation is not visualized. No evidence of pulmonic stenosis. Aorta: The  aortic root is normal in size and structure. Venous: The inferior  vena cava is normal in size with greater than 50% respiratory variability, suggesting right atrial pressure of 3 mmHg. IAS/Shunts: No atrial level shunt detected by color flow Doppler. Additional Comments: 3D was performed not requiring image post processing on an independent workstation and was normal.  LEFT VENTRICLE PLAX 2D LVIDd:         4.40 cm         Diastology LVIDs:         3.10 cm         LV e' medial:    10.20 cm/s LV PW:         0.90 cm         LV E/e' medial:  7.0 LV IVS:        0.90 cm         LV e' lateral:   13.80 cm/s LVOT diam:     2.00 cm         LV E/e' lateral: 5.2 LVOT Area:     3.14 cm                                2D Longitudinal                                Strain                                2D Strain GLS   -20.3 %                                Avg:                                 3D Volume EF                                LV 3D EF:    Left                                             ventricul                                             ar                                             ejection                                             fraction  by 3D                                             volume is                                             54 %.                                 3D Volume EF:                                3D EF:        54 % RIGHT VENTRICLE RV Basal diam:  3.30 cm RV Mid diam:    2.50 cm RV S prime:     13.10 cm/s TAPSE (M-mode): 2.1 cm LEFT ATRIUM             Index        RIGHT ATRIUM           Index LA diam:        2.30 cm 1.48 cm/m   RA Area:     13.50 cm LA Vol (A2C):   38.0 ml 24.47 ml/m  RA Volume:   31.00 ml  19.96 ml/m LA Vol (A4C):   27.5 ml 17.71 ml/m LA Biplane Vol: 33.0 ml 21.25 ml/m                        PULMONIC VALVE AORTA                 PR End Diast Vel: 2.57 msec Ao Root diam: 3.50 cm  MITRAL VALVE               TRICUSPID VALVE  MV Area (PHT): 3.79 cm    TR Peak grad:   17.6 mmHg MV Peak grad:  2.7 mmHg    TR Vmax:        210.00 cm/s MV Mean grad:  1.0 mmHg MV Vmax:       0.81 m/s    SHUNTS MV Vmean:      46.1 cm/s   Systemic Diam: 2.00 cm MV Decel Time: 200 msec MV E velocity: 71.80 cm/s MV A velocity: 56.10 cm/s MV E/A ratio:  1.28 Julien Nordmann MD Electronically signed by Julien Nordmann MD Signature Date/Time: 06/07/2023/5:30:44 PM    Final

## 2023-06-13 NOTE — Progress Notes (Signed)
 Echo cardiogram last weak, the guy told her that she had a leaky left valve, it was insignificant. She was recently put on a bladder medication for having incognizance during intercourse. She wants to know when she can start dying her hair? She wants to know if she can get a refill on her oxybutynin. She would like to see Dr. Smith Robert when Dr. Daisy Floro.

## 2023-06-13 NOTE — Patient Instructions (Signed)
 [facility] - A DEPT OF [facility]  Discharge Instructions: Thank you for choosing [facility] to provide your oncology and hematology care.  If you have a lab appointment with the Cancer Center, please go directly to the Cancer Center and check in at the registration area.  Wear comfortable clothing and clothing appropriate for easy access to any Portacath or PICC line.   We strive to give you quality time with your provider. You may need to reschedule your appointment if you arrive late (15 or more minutes).  Arriving late affects you and other patients whose appointments are after yours.  Also, if you miss three or more appointments without notifying the office, you may be dismissed from the clinic at the provider's discretion.      For prescription refill requests, have your pharmacy contact our office and allow 72 hours for refills to be completed.    Today you received the following chemotherapy and/or immunotherapy agents KANINTI      To help prevent nausea and vomiting after your treatment, we encourage you to take your nausea medication as directed.  BELOW ARE SYMPTOMS THAT SHOULD BE REPORTED IMMEDIATELY: *FEVER GREATER THAN 100.4 F (38 C) OR HIGHER *CHILLS OR SWEATING *NAUSEA AND VOMITING THAT IS NOT CONTROLLED WITH YOUR NAUSEA MEDICATION *UNUSUAL SHORTNESS OF BREATH *UNUSUAL BRUISING OR BLEEDING *URINARY PROBLEMS (pain or burning when urinating, or frequent urination) *BOWEL PROBLEMS (unusual diarrhea, constipation, pain near the anus) TENDERNESS IN MOUTH AND THROAT WITH OR WITHOUT PRESENCE OF ULCERS (sore throat, sores in mouth, or a toothache) UNUSUAL RASH, SWELLING OR PAIN  UNUSUAL VAGINAL DISCHARGE OR ITCHING   Items with * indicate a potential emergency and should be followed up as soon as possible or go to the Emergency Department if any problems should occur.  Please show the CHEMOTHERAPY ALERT CARD or IMMUNOTHERAPY  ALERT CARD at check-in to the Emergency Department and triage nurse.  Should you have questions after your visit or need to cancel or reschedule your appointment, please contact [facility] - A DEPT OF [facility]  [phone] and follow the prompts.  Office hours are 8:00 a.m. to 4:30 p.m. Monday - Friday. Please note that voicemails left after 4:00 p.m. may not be returned until the following business day.  We are closed weekends and major holidays. You have access to a nurse at all times for urgent questions. Please call the main number to the clinic [phone] and follow the prompts.  For any non-urgent questions, you may also contact your provider using MyChart. We now offer e-Visits for anyone 80 and older to request care online for non-urgent symptoms. For details visit [email or web address].   Also download the MyChart app! Go to the app store, search "MyChart", open the app, select [place], and log in with your MyChart username and password.  Trastuzumab Injection What is this medication? TRASTUZUMAB (tras TOO zoo mab) treats breast cancer and stomach cancer. It works by blocking a protein that causes cancer cells to grow and multiply. This helps to slow or stop the spread of cancer cells. This medicine may be used for other purposes; ask your health care provider or pharmacist if you have questions. COMMON BRAND NAME(S): Herceptin, HERCESSI, Herzuma, KANJINTI, Ogivri, Ontruzant, Trazimera What should I tell my care team before I take this medication? They need to know if you have any of these conditions: Heart failure Lung disease  An unusual or allergic reaction to trastuzumab, other medications, foods, dyes, or preservatives Pregnant or trying to get pregnant Breast-feeding How should I use this medication? This medication is injected into a vein. It is given by your care team in a hospital or clinic setting. Talk to your care team about the  use of this medication in children. It is not approved for use in children. Overdosage: If you think you have taken too much of this medicine contact a poison control center or emergency room at once. NOTE: This medicine is only for you. Do not share this medicine with others. What if I miss a dose? Keep appointments for follow-up doses. It is important not to miss your dose. Call your care team if you are unable to keep an appointment. What may interact with this medication? Certain types of chemotherapy, such as daunorubicin, doxorubicin, epirubicin, idarubicin This list may not describe all possible interactions. Give your health care provider a list of all the medicines, herbs, non-prescription drugs, or dietary supplements you use. Also tell them if you smoke, drink alcohol, or use illegal drugs. Some items may interact with your medicine. What should I watch for while using this medication? Your condition will be monitored carefully while you are receiving this medication. This medication may make you feel generally unwell. This is not uncommon, as chemotherapy affects healthy cells as well as cancer cells. Report any side effects. Continue your course of treatment even though you feel ill unless your care team tells you to stop. This medication may increase your risk of getting an infection. Call your care team for advice if you get a fever, chills, sore throat, or other symptoms of a cold or flu. Do not treat yourself. Try to avoid being around people who are sick. Avoid taking medications that contain aspirin, acetaminophen, ibuprofen, naproxen, or ketoprofen unless instructed by your care team. These medications can hide a fever. Talk to your care team if you may be pregnant. Serious birth defects can occur if you take this medication during pregnancy and for 7 months after the last dose. You will need a negative pregnancy test before starting this medication. Contraception is recommended  while taking this medication and for 7 months after the last dose. Your care team can help you find the option that works for you. Do not breastfeed while taking this medication and for 7 months after stopping treatment. What side effects may I notice from receiving this medication? Side effects that you should report to your care team as soon as possible: Allergic reactions or angioedema--skin rash, itching or hives, swelling of the face, eyes, lips, tongue, arms, or legs, trouble swallowing or breathing Dry cough, shortness of breath or trouble breathing Heart failure--shortness of breath, swelling of the ankles, feet, or hands, sudden weight gain, unusual weakness or fatigue Infection--fever, chills, cough, or sore throat Infusion reactions--chest pain, shortness of breath or trouble breathing, feeling faint or lightheaded Side effects that usually do not require medical attention (report to your care team if they continue or are bothersome): Diarrhea Dizziness Headache Nausea Trouble sleeping Vomiting This list may not describe all possible side effects. Call your doctor for medical advice about side effects. You may report side effects to FDA at 1-800-FDA-1088. Where should I keep my medication? This medication is given in a hospital or clinic. It will not be stored at home. NOTE: This sheet is a summary. It may not cover all possible information. If you have questions about this  medicine, talk to your doctor, pharmacist, or health care provider.  2024 Elsevier/Gold Standard (2021-07-27 00:00:00)

## 2023-07-03 ENCOUNTER — Encounter: Payer: Self-pay | Admitting: Internal Medicine

## 2023-07-03 ENCOUNTER — Other Ambulatory Visit: Payer: Self-pay | Admitting: *Deleted

## 2023-07-03 DIAGNOSIS — Z95828 Presence of other vascular implants and grafts: Secondary | ICD-10-CM

## 2023-07-04 ENCOUNTER — Inpatient Hospital Stay: Attending: Internal Medicine

## 2023-07-04 VITALS — BP 130/79 | HR 64 | Temp 97.3°F | Resp 18 | Wt 115.7 lb

## 2023-07-04 DIAGNOSIS — Z79899 Other long term (current) drug therapy: Secondary | ICD-10-CM | POA: Diagnosis not present

## 2023-07-04 DIAGNOSIS — C50912 Malignant neoplasm of unspecified site of left female breast: Secondary | ICD-10-CM

## 2023-07-04 DIAGNOSIS — C50012 Malignant neoplasm of nipple and areola, left female breast: Secondary | ICD-10-CM | POA: Insufficient documentation

## 2023-07-04 DIAGNOSIS — Z171 Estrogen receptor negative status [ER-]: Secondary | ICD-10-CM | POA: Diagnosis not present

## 2023-07-04 DIAGNOSIS — F419 Anxiety disorder, unspecified: Secondary | ICD-10-CM | POA: Diagnosis not present

## 2023-07-04 DIAGNOSIS — Z5112 Encounter for antineoplastic immunotherapy: Secondary | ICD-10-CM | POA: Diagnosis present

## 2023-07-04 MED ORDER — HEPARIN SOD (PORK) LOCK FLUSH 100 UNIT/ML IV SOLN
500.0000 [IU] | Freq: Once | INTRAVENOUS | Status: AC | PRN
  Administered 2023-07-04: 500 [IU]
  Filled 2023-07-04: qty 5

## 2023-07-04 MED ORDER — SODIUM CHLORIDE 0.9 % IV SOLN
Freq: Once | INTRAVENOUS | Status: AC
  Filled 2023-07-04: qty 250

## 2023-07-04 MED ORDER — SODIUM CHLORIDE 0.9 % IV SOLN
6.0000 mg/kg | Freq: Once | INTRAVENOUS | Status: AC
  Administered 2023-07-04: 300 mg via INTRAVENOUS
  Filled 2023-07-04: qty 14.29

## 2023-07-04 MED ORDER — DIPHENHYDRAMINE HCL 25 MG PO CAPS
50.0000 mg | ORAL_CAPSULE | Freq: Once | ORAL | Status: AC
  Administered 2023-07-04: 50 mg via ORAL
  Filled 2023-07-04: qty 2

## 2023-07-04 MED ORDER — ACETAMINOPHEN 325 MG PO TABS
650.0000 mg | ORAL_TABLET | Freq: Once | ORAL | Status: AC
  Administered 2023-07-04: 650 mg via ORAL
  Filled 2023-07-04: qty 2

## 2023-07-04 NOTE — Patient Instructions (Signed)
 [facility] A [facility]  Discharge Instructions: Thank you for choosing [facility] to provide your oncology and hematology care.  If you have a lab appointment with the Cancer Center, please go directly to the Cancer Center and check in at the registration area.  Wear comfortable clothing and clothing appropriate for easy access to any Portacath or PICC line.   We strive to give you quality time with your provider. You may need to reschedule your appointment if you arrive late (15 or more minutes).  Arriving late affects you and other patients whose appointments are after yours.  Also, if you miss three or more appointments without notifying the office, you may be dismissed from the clinic at the provider's discretion.      For prescription refill requests, have your pharmacy contact our office and allow 72 hours for refills to be completed.    Today you received the following chemotherapy and/or immunotherapy agents KANJINTI       To help prevent nausea and vomiting after your treatment, we encourage you to take your nausea medication as directed.  BELOW ARE SYMPTOMS THAT SHOULD BE REPORTED IMMEDIATELY: *FEVER GREATER THAN 100.4 F (38 C) OR HIGHER *CHILLS OR SWEATING *NAUSEA AND VOMITING THAT IS NOT CONTROLLED WITH YOUR NAUSEA MEDICATION *UNUSUAL SHORTNESS OF BREATH *UNUSUAL BRUISING OR BLEEDING *URINARY PROBLEMS (pain or burning when urinating, or frequent urination) *BOWEL PROBLEMS (unusual diarrhea, constipation, pain near the anus) TENDERNESS IN MOUTH AND THROAT WITH OR WITHOUT PRESENCE OF ULCERS (sore throat, sores in mouth, or a toothache) UNUSUAL RASH, SWELLING OR PAIN  UNUSUAL VAGINAL DISCHARGE OR ITCHING   Items with * indicate a potential emergency and should be followed up as soon as possible or go to the Emergency Department if any problems should occur.  Please show the CHEMOTHERAPY ALERT CARD or IMMUNOTHERAPY  ALERT CARD at check-in to the Emergency Department and triage nurse.  Should you have questions after your visit or need to cancel or reschedule your appointment, please contact [facility] - A DEPT OF [facility]  [phone] and follow the prompts.  Office hours are 8:00 a.m. to 4:30 p.m. Monday - Friday. Please note that voicemails left after 4:00 p.m. may not be returned until the following business day.  We are closed weekends and major holidays. You have access to a nurse at all times for urgent questions. Please call the main number to the clinic [phone] and follow the prompts.  For any non-urgent questions, you may also contact your provider using MyChart. We now offer e-Visits for anyone 71 and older to request care online for non-urgent symptoms. For details visit [email or web address].   Also download the MyChart app! Go to the app store, search "MyChart", open the app, select [place], and log in with your MyChart username and password.  Trastuzumab Injection What is this medication? TRASTUZUMAB (tras TOO zoo mab) treats breast cancer and stomach cancer. It works by blocking a protein that causes cancer cells to grow and multiply. This helps to slow or stop the spread of cancer cells. This medicine may be used for other purposes; ask your health care provider or pharmacist if you have questions. COMMON BRAND NAME(S): Herceptin, HERCESSI, Herzuma, KANJINTI, Ogivri, Ontruzant, Trazimera What should I tell my care team before I take this medication? They need to know if you have any of these conditions: Heart failure Lung  disease An unusual or allergic reaction to trastuzumab, other medications, foods, dyes, or preservatives Pregnant or trying to get pregnant Breast-feeding How should I use this medication? This medication is injected into a vein. It is given by your care team in a hospital or clinic setting. Talk to your care team about the  use of this medication in children. It is not approved for use in children. Overdosage: If you think you have taken too much of this medicine contact a poison control center or emergency room at once. NOTE: This medicine is only for you. Do not share this medicine with others. What if I miss a dose? Keep appointments for follow-up doses. It is important not to miss your dose. Call your care team if you are unable to keep an appointment. What may interact with this medication? Certain types of chemotherapy, such as daunorubicin, doxorubicin, epirubicin, idarubicin This list may not describe all possible interactions. Give your health care provider a list of all the medicines, herbs, non-prescription drugs, or dietary supplements you use. Also tell them if you smoke, drink alcohol, or use illegal drugs. Some items may interact with your medicine. What should I watch for while using this medication? Your condition will be monitored carefully while you are receiving this medication. This medication may make you feel generally unwell. This is not uncommon, as chemotherapy affects healthy cells as well as cancer cells. Report any side effects. Continue your course of treatment even though you feel ill unless your care team tells you to stop. This medication may increase your risk of getting an infection. Call your care team for advice if you get a fever, chills, sore throat, or other symptoms of a cold or flu. Do not treat yourself. Try to avoid being around people who are sick. Avoid taking medications that contain aspirin, acetaminophen, ibuprofen, naproxen, or ketoprofen unless instructed by your care team. These medications can hide a fever. Talk to your care team if you may be pregnant. Serious birth defects can occur if you take this medication during pregnancy and for 7 months after the last dose. You will need a negative pregnancy test before starting this medication. Contraception is recommended  while taking this medication and for 7 months after the last dose. Your care team can help you find the option that works for you. Do not breastfeed while taking this medication and for 7 months after stopping treatment. What side effects may I notice from receiving this medication? Side effects that you should report to your care team as soon as possible: Allergic reactions or angioedema--skin rash, itching or hives, swelling of the face, eyes, lips, tongue, arms, or legs, trouble swallowing or breathing Dry cough, shortness of breath or trouble breathing Heart failure--shortness of breath, swelling of the ankles, feet, or hands, sudden weight gain, unusual weakness or fatigue Infection--fever, chills, cough, or sore throat Infusion reactions--chest pain, shortness of breath or trouble breathing, feeling faint or lightheaded Side effects that usually do not require medical attention (report to your care team if they continue or are bothersome): Diarrhea Dizziness Headache Nausea Trouble sleeping Vomiting This list may not describe all possible side effects. Call your doctor for medical advice about side effects. You may report side effects to FDA at 1-800-FDA-1088. Where should I keep my medication? This medication is given in a hospital or clinic. It will not be stored at home. NOTE: This sheet is a summary. It may not cover all possible information. If you have questions about  this medicine, talk to your doctor, pharmacist, or health care provider.  2024 Elsevier/Gold Standard (2021-07-27 00:00:00)

## 2023-07-05 ENCOUNTER — Encounter: Payer: Self-pay | Admitting: Radiation Oncology

## 2023-07-05 ENCOUNTER — Ambulatory Visit
Admission: RE | Admit: 2023-07-05 | Discharge: 2023-07-05 | Disposition: A | Discharge status: Home / Self Care | Payer: 59 | Source: Ambulatory Visit | Attending: Radiation Oncology | Admitting: Radiation Oncology

## 2023-07-05 VITALS — BP 132/72 | HR 63 | Temp 97.0°F | Resp 14 | Ht 64.0 in | Wt 115.0 lb

## 2023-07-05 DIAGNOSIS — C50912 Malignant neoplasm of unspecified site of left female breast: Secondary | ICD-10-CM | POA: Insufficient documentation

## 2023-07-05 DIAGNOSIS — Z170 Estrogen receptor positive status [ER+]: Secondary | ICD-10-CM | POA: Diagnosis not present

## 2023-07-05 DIAGNOSIS — Z923 Personal history of irradiation: Secondary | ICD-10-CM | POA: Diagnosis not present

## 2023-07-05 NOTE — Progress Notes (Signed)
 Radiation Oncology Follow up Note  Name: Jennifer Garrison   Date:   07/05/2023 MRN:  [id number] DOB: [birth year]    This 63 y.o. female presents to the clinic today for 18-month follow-up status post radiation therapy to her left breast for stage Ia (pT1 cN0 M0) grade 3 ER/PR negative HER2/neu overexpressed invasive mammary carcinoma.  REFERRING PROVIDER: Lorre Munroe, NP  HPI: Patient is a 63 year old female now out 7 months having completed whole breast radiation to her left breast for stage Ia ER/PR negative HER2/neu overexpressed invasive mammary carcinoma stage Ia.  Seen today in routine follow-up she is doing well.  She specifically denies breast tenderness cough or bone pain..  She is currently on Herceptin which she is tolerating well.  She did experience cardiotoxicity from trastuzumab.  Her ejection fraction was decreased back in November with left ventricle demonstrating global hypokinesis.  She had grade 1 diastolic dysfunction she is asymptomatic at this time and Herceptin has been resumed.  COMPLICATIONS OF TREATMENT: none  FOLLOW UP COMPLIANCE: keeps appointments   PHYSICAL EXAM:  There were no vitals taken for this visit. Lungs are clear to A&P cardiac examination essentially unremarkable with regular rate and rhythm. No dominant mass or nodularity is noted in either breast in 2 positions examined. Incision is well-healed. No axillary or supraclavicular adenopathy is appreciated. Cosmetic result is excellent.  Well-developed well-nourished patient in NAD. HEENT reveals PERLA, EOMI, discs not visualized.  Oral cavity is clear. No oral mucosal lesions are identified. Neck is clear without evidence of cervical or supraclavicular adenopathy. Lungs are clear to A&P. Cardiac examination is essentially unremarkable with regular rate and rhythm without murmur rub or thrill. Abdomen is benign with no organomegaly or masses noted. Motor sensory and DTR levels are equal and symmetric in the upper  and lower extremities. Cranial nerves II through XII are grossly intact. Proprioception is intact. No peripheral adenopathy or edema is identified. No motor or sensory levels are noted. Crude visual fields are within normal range.  RADIOLOGY RESULTS: No current films for review  PLAN: Present time patient is doing well.  She continues under treatment with medical oncology with Herceptin.  She is tolerating that well.  She will complete those treatments this month already plans to remove her Port-A-Cath I have asked to see her back in 6 months for follow-up and then we will start once year follow-up visits.  Patient knows to call with any concerns at any time.  I would like to take this opportunity to thank you for allowing me to participate in the care of your patient.Carmina Miller, MD

## 2023-07-06 ENCOUNTER — Other Ambulatory Visit: Payer: Self-pay

## 2023-07-25 ENCOUNTER — Encounter: Payer: Self-pay | Admitting: *Deleted

## 2023-07-25 ENCOUNTER — Inpatient Hospital Stay

## 2023-07-25 ENCOUNTER — Inpatient Hospital Stay (HOSPITAL_BASED_OUTPATIENT_CLINIC_OR_DEPARTMENT_OTHER): Admitting: Internal Medicine

## 2023-07-25 ENCOUNTER — Encounter: Payer: Self-pay | Admitting: Internal Medicine

## 2023-07-25 VITALS — BP 125/74 | HR 53 | Temp 95.0°F | Resp 19

## 2023-07-25 VITALS — BP 118/72 | HR 81 | Temp 97.6°F | Resp 18 | Wt 113.8 lb

## 2023-07-25 DIAGNOSIS — C50912 Malignant neoplasm of unspecified site of left female breast: Secondary | ICD-10-CM | POA: Diagnosis not present

## 2023-07-25 DIAGNOSIS — Z5112 Encounter for antineoplastic immunotherapy: Secondary | ICD-10-CM | POA: Diagnosis not present

## 2023-07-25 DIAGNOSIS — M79674 Pain in right toe(s): Secondary | ICD-10-CM | POA: Diagnosis not present

## 2023-07-25 DIAGNOSIS — Z171 Estrogen receptor negative status [ER-]: Secondary | ICD-10-CM

## 2023-07-25 LAB — COMPREHENSIVE METABOLIC PANEL WITH GFR
ALT: 17 U/L (ref 0–44)
AST: 20 U/L (ref 15–41)
Albumin: 3.9 g/dL (ref 3.5–5.0)
Alkaline Phosphatase: 46 U/L (ref 38–126)
Anion gap: 8 (ref 5–15)
BUN: 17 mg/dL (ref 8–23)
CO2: 23 mmol/L (ref 22–32)
Calcium: 8.7 mg/dL — ABNORMAL LOW (ref 8.9–10.3)
Chloride: 106 mmol/L (ref 98–111)
Creatinine, Ser: 0.8 mg/dL (ref 0.44–1.00)
GFR, Estimated: >60 mL/min (ref >60)
Glucose, Bld: 109 mg/dL — ABNORMAL HIGH (ref 70–99)
Potassium: 3.8 mmol/L (ref 3.5–5.1)
Sodium: 137 mmol/L (ref 135–145)
Total Bilirubin: 0.7 mg/dL (ref 0.0–1.2)
Total Protein: 6.6 g/dL (ref 6.5–8.1)

## 2023-07-25 LAB — CBC WITH DIFFERENTIAL/PLATELET
Abs Immature Granulocytes: 0.04 10*3/uL (ref 0.00–0.07)
Basophils Absolute: 0.1 10*3/uL (ref 0.0–0.1)
Basophils Relative: 1 %
Eosinophils Absolute: 0.3 10*3/uL (ref 0.0–0.5)
Eosinophils Relative: 4 %
HCT: 39.9 % (ref 36.0–46.0)
Hemoglobin: 13.1 g/dL (ref 12.0–15.0)
Immature Granulocytes: 1 %
Lymphocytes Relative: 18 %
Lymphs Abs: 1.3 10*3/uL (ref 0.7–4.0)
MCH: 28.8 pg (ref 26.0–34.0)
MCHC: 32.8 g/dL (ref 30.0–36.0)
MCV: 87.7 fL (ref 80.0–100.0)
Monocytes Absolute: 0.5 10*3/uL (ref 0.1–1.0)
Monocytes Relative: 6 %
Neutro Abs: 5.3 10*3/uL (ref 1.7–7.7)
Neutrophils Relative %: 70 %
Platelets: 243 10*3/uL (ref 150–400)
RBC: 4.55 MIL/uL (ref 3.87–5.11)
RDW: 12.7 % (ref 11.5–15.5)
WBC: 7.5 10*3/uL (ref 4.0–10.5)
nRBC: 0 % (ref 0.0–0.2)

## 2023-07-25 MED ORDER — ACETAMINOPHEN 325 MG PO TABS
650.0000 mg | ORAL_TABLET | Freq: Once | ORAL | Status: AC
  Administered 2023-07-25: 650 mg via ORAL
  Filled 2023-07-25: qty 2

## 2023-07-25 MED ORDER — DIPHENHYDRAMINE HCL 25 MG PO CAPS
50.0000 mg | ORAL_CAPSULE | Freq: Once | ORAL | Status: AC
  Administered 2023-07-25: 50 mg via ORAL
  Filled 2023-07-25: qty 2

## 2023-07-25 MED ORDER — SODIUM CHLORIDE 0.9 % IV SOLN
Freq: Once | INTRAVENOUS | Status: AC
  Filled 2023-07-25: qty 250

## 2023-07-25 MED ORDER — TRASTUZUMAB-ANNS CHEMO 150 MG IV SOLR
6.0000 mg/kg | Freq: Once | INTRAVENOUS | Status: AC
  Administered 2023-07-25: 300 mg via INTRAVENOUS
  Filled 2023-07-25: qty 14.29

## 2023-07-25 MED ORDER — HEPARIN SOD (PORK) LOCK FLUSH 100 UNIT/ML IV SOLN
500.0000 [IU] | Freq: Once | INTRAVENOUS | Status: AC | PRN
  Administered 2023-07-25: 500 [IU]
  Filled 2023-07-25: qty 5

## 2023-07-25 NOTE — Progress Notes (Signed)
 [facility] CONSULT NOTE  Patient Care Team: Carollynn Cirri, NP as PCP - General (Internal Medicine) Wenona Hamilton, MD as PCP - Cardiology (Cardiology) Waverly Hageman, RN as Oncology Nurse Navigator Loreatha Rodney, MD as Consulting Physician (Oncology) Glenis Langdon, MD as Consulting Physician (Radiation Oncology) Conrado Delay, DO as Consulting Physician (General Surgery)   CANCER STAGING   Cancer Staging  Breast cancer [facility]) Staging form: Breast, AJCC 8th Edition - Clinical stage from 05/17/2022: Stage IA (cT1b, cN0, cM0, G3, ER-, PR-, HER2+) - Signed by Lamaj Metoyer, MD on 05/27/2022 Stage prefix: Initial diagnosis Histologic grading system: 3 grade system  Current treatment Weekly paclitaxel  with Herceptin  (APT regimen) started on 07/18/2022  ASSESSMENT & PLAN:  Jennifer Garrison 63 y.o. female with pmh of osteoporosis and anxiety was referred to medical oncology for management of left breast stage IA invasive ductal cancer.  # Left breast IDC stage IA, ER/PR negative, HER2 positive  -Patient felt a palpable mass in her left breast.    -S/p left breast lumpectomy with SLNB by Dr. Rosea Conch on 06/09/2022. Pathology showed 1.4 cm IDC, grade 3, DCIS present grade 2-3 with comedonecrosis, 0/3 lymph nodes negative, margins negative, pT1c.  ER/PR negative.  HER2 positive.   -Adjuvant radiation completed on 11/22/2022.  -Last mammogram from 10/05/2022 was negative.  Diagnostic mammogram scheduled for mid July 2025.  - Completed weekly Taxol  and Heceptin x 12 on 10/03/2022.  Labs reviewed and acceptable for treatment.  Proceed with trastuzumab  6 mg/kg.  Today will be the last dose.  She is up-to-date with the echocardiogram.  Patient and her husband requesting if at some point full body CT imaging could be performed to make sure the cancer is not coming back.  We discussed in detail that for early stage breast cancer, CT imaging has not been shown to improve detection rate or  overall prognosis.  Then they did express interest in getting diagnostic mammo every 6 months.  I explained this may or may not be covered by the insurance since the guidelines usually recommend once a year imaging.  She will think about it but may be ready to pay out-of-pocket.  This to be rediscussed next visit with Dr. Randy Buttery.  # Trastuzumab  induced cardiotoxicity -Recovered.  -Echo from 02/10/2023 showed decrease in the ejection fraction from 55 to 60% to 45 to 50%.  The left ventricle demonstrated global hypokinesis.  Grade 1 diastolic Dysfunction.  Herceptin  was placed on hold. -Seen by Dr. Alvenia Aus, cardiologist -considering patient is asymptomatic there is no indication for cardiac medications.  Also with the echo reading there was concern for some interpersonal variation.  Repeat echo showed improved EF.  # Anxiety -Continue with Ativan  0.5 mg every 8 hours as needed.  On Wellbutrin .  # Evaluated by genetics-testing negative  # Access-she prefers to get port out ASAP.  Scheduled on May 6 with Dr. Rosea Conch.  # Osteoporosis - On alendronate.  Managed by primary doctor.  RTC in 3 months with Dr. Randy Buttery, labs, to discuss mammogram.  And to establish care.  Orders Placed This Encounter  Procedures   MM 3D DIAGNOSTIC MAMMOGRAM BILATERAL BREAST W/IMPLANT    Standing Status:   Future    Expected Date:   10/24/2023    Expiration Date:   07/24/2024    Reason for Exam (SYMPTOM  OR DIAGNOSIS REQUIRED):   breast cancer    Preferred imaging location?:   [facility]   US  Breast Limited Uni Left Inc  Axilla    Standing Status:   Future    Expected Date:   10/24/2023    Expiration Date:   07/24/2024    Reason for Exam (SYMPTOM  OR DIAGNOSIS REQUIRED):   breast cancer    Preferred Imaging Location?:   [facility]   US  Breast Limited Uni Right Inc Axilla    Standing Status:   Future    Expected Date:   10/24/2023    Expiration Date:   07/24/2024    Reason for Exam (SYMPTOM  OR DIAGNOSIS  REQUIRED):   breast cancer    Preferred Imaging Location?:   [facility]   CBC with Differential (Cancer Center Only)    Standing Status:   Future    Expected Date:   10/24/2023    Expiration Date:   07/24/2024   CMP (Cancer Center only)    Standing Status:   Future    Expected Date:   10/24/2023    Expiration Date:   07/24/2024   Ambulatory referral to Podiatry    Referral Priority:   Routine    Referral Type:   Consultation    Referral Reason:   Specialty Services Required    Referred to Provider:   Dot Gazella, DPM    Requested Specialty:   Podiatry    Number of Visits Requested:   1     The total time spent in the appointment was 30 minutes encounter with patients including review of chart and various tests results, discussions about plan of care and coordination of care plan   All questions were answered. The patient knows to call the clinic with any problems, questions or concerns. No barriers to learning was detected.  Loreatha Rodney, MD 4/29/202510:43 AM   HISTORY OF PRESENTING ILLNESS:  Jennifer Garrison 63 y.o. female with pmh of osteoporosis on alendronate and anxiety was referred to medical oncology for further management of stage Ia left breast invasive ductal cancer HER2 positive  Patient felt a palpable mass in her left breast mid February 2024.  She sought medical attention from PCP.  Further imaging as below.  INTERVAL HISTORY- Patient seen today as follow-up for her maintenance Herceptin . She reports feeling well overall.  Denies any shortness of breath, chest pain, dizziness.  She did a 10 mile hike last week.   I have reviewed her chart and materials related to her cancer extensively and collaborated history with the patient. Summary of oncologic history is as follows: Oncology History  Breast cancer (HCC)  05/11/2022 Mammogram   Diagnostic mammogram and ultrasound- FINDINGS: Full field and spot compression views of the LEFT breast demonstrate a 1 cm  irregular mass within the UPPER INNER LEFT breast, at the site of patient concern. The patient has retropectoral implants.   On physical exam, a firm palpable mass identified at the 11 o'clock position of the LEFT breast 8 cm from the nipple.   Targeted ultrasound is performed, showing a 0.9 x 0.8 x 1 cm irregular hypoechoic mass at the 11 o'clock position of the LEFT breast 8 cm from the nipple.   No abnormal LEFT axillary lymph nodes are noted.   IMPRESSION: 1. Suspicious 1 cm UPPER INNER LEFT breast mass. Tissue sampling is recommended. 2. No abnormal appearing LEFT axillary lymph nodes.   05/17/2022 Cancer Staging   Staging form: Breast, AJCC 8th Edition - Clinical stage from 05/17/2022: Stage IA (cT1b, cN0, cM0, G3, ER-, PR-, HER2+) - Signed by Miche Loughridge, MD on 05/27/2022 Stage prefix:  Initial diagnosis Histologic grading system: 3 grade system   05/17/2022 Initial Biopsy   DIAGNOSIS:  A. BREAST, LEFT, 11:00, 8 CM FROM NIPPLE, ULTRASOUND-GUIDED BIOPSY:   - INVASIVE MAMMARY CARCINOMA, NOS DUCTAL.   Size of invasive carcinoma: 10 mm in this sample  Histologic grade of invasive carcinoma: Grade 3                       Glandular/tubular differentiation score: 3                       Nuclear pleomorphism score: 3                       Mitotic rate score: 2                       Total score: 8  Ductal carcinoma in situ: Not identified  Lymphovascular invasion: Not identified   Estrogen Receptor (ER) Status: NEGATIVE (LESS THAN 1%)   Progesterone Receptor (PgR) Status: NEGATIVE (LESS THAN 1%)   HER2 (by immunohistochemistry): POSITIVE (Score 3+)       Percentage of cells with uniform intense complete membrane  staining: 80%  Ki-67: Not performed     Genetic Testing   No pathogenic variants identified on the [facility] Multi-Cancer+RNA panel. VUS in MUTYH called c.1484G>A identified. The report date is 06/09/2022.  The Multi-Cancer + RNA Panel offered by [facility] includes  sequencing and/or deletion/duplication analysis of the following 70 genes:  AIP*, ALK, APC*, ATM*, AXIN2*, BAP1*, BARD1*, BLM*, BMPR1A*, BRCA1*, BRCA2*, BRIP1*, CDC73*, CDH1*, CDK4, CDKN1B*, CDKN2A, CHEK2*, CTNNA1*, DICER1*, EPCAM, EGFR, FH*, FLCN*, GREM1, HOXB13, KIT, LZTR1, MAX*, MBD4, MEN1*, MET, MITF, MLH1*, MSH2*, MSH3*, MSH6*, MUTYH*, NF1*, NF2*, NTHL1*, PALB2*, PDGFRA, PMS2*, POLD1*, POLE*, POT1*, PRKAR1A*, PTCH1*, PTEN*, RAD51C*, RAD51D*, RB1*, RET, SDHA*, SDHAF2*, SDHB*, SDHC*, SDHD*, SMAD4*, SMARCA4*, SMARCB1*, SMARCE1*, STK11*, SUFU*, TMEM127*, TP53*, TSC1*, TSC2*, VHL*. RNA analysis is performed for * genes.   06/09/2022 Surgery   S/p left breast lumpectomy with SLNB by Dr. Rosea Conch  Pathology showed 1.4 cm IDC, grade 3, DCIS present grade 2-3 with comedonecrosis, 0/3 lymph nodes negative, margins negative, pT1c.  ER/PR negative.  HER2 positive   07/18/2022 -  Chemotherapy   Patient is on Treatment Plan : BREAST Paclitaxel  + Trastuzumab  q7d / Trastuzumab  q21d      Menarche age 3 or 31 Age at first birth 24 Used birth control pills and Depo more than 5 years Menopause was age 35 Hysterectomy yes for prolapsed uterus HRT no History of breast biopsies yes for breast cyst in left lower Family history of lung cancer in mother who was smoker  MEDICAL HISTORY:  Past Medical History:  Diagnosis Date   Allergy 1970   Anxiety 2000   Arthritis 2021   Breast cancer (HCC) 05/27/2022   Cancer (HCC) 05/11/2022   Breast stage A1   Depression 2000   IBS (irritable bowel syndrome)    Trigger finger    Left Middle Finger 11/2016    SURGICAL HISTORY: Past Surgical History:  Procedure Laterality Date   ABDOMINAL HYSTERECTOMY  02/2016   Only cervix remains (done for prolapse)   ABDOMINAL HYSTERECTOMY  2017   AUGMENTATION MAMMAPLASTY Bilateral 2010   BREAST BIOPSY Left 05/17/2022   US  BX, ribbon clip, path pending   BREAST BIOPSY Left 05/17/2022   US  LT BREAST BX W LOC DEV 1ST LESION IMG  BX SPEC US  GUIDE  05/17/2022 [facility]   BREAST CYST ASPIRATION Left 2010   neg   BREAST ENHANCEMENT SURGERY  1998   CHOLECYSTECTOMY  1991   COLONOSCOPY WITH PROPOFOL  N/A 10/10/2019   Procedure: COLONOSCOPY WITH PROPOFOL ;  Surgeon: Luke Salaam, MD;  Location: [facility] ENDOSCOPY;  Service: Gastroenterology;  Laterality: N/A;   PARTIAL MASTECTOMY WITH AXILLARY SENTINEL LYMPH NODE BIOPSY Left 06/09/2022   Procedure: PARTIAL MASTECTOMY WITH AXILLARY SENTINEL LYMPH NODE BIOPSY;  Surgeon: Conrado Delay, DO;  Location: [facility] ORS;  Service: General;  Laterality: Left;   PORTACATH PLACEMENT N/A 06/30/2022   Procedure: INSERTION PORT-A-CATH;  Surgeon: Conrado Delay, DO;  Location: [facility] ORS;  Service: General;  Laterality: N/A;   TYMPANOPLASTY Left     SOCIAL HISTORY: Social History   Socioeconomic History   Marital status: Married    Spouse name: Myrtie Atkinson   Number of children: 1   Years of education: Not on file   Highest education level: Associate degree: occupational, Scientist, product/process development, or vocational program  Occupational History   Not on file  Tobacco Use   Smoking status: Former    Current packs/day: 0.00    Types: Cigarettes    Quit date: 09/09/1992    Years since quitting: 30.8   Smokeless tobacco: Current    Types: Chew   Tobacco comments:    Got pregnant  Vaping Use   Vaping status: Every Day  Substance and Sexual Activity   Alcohol use: Not Currently   Drug use: Yes    Types: Marijuana    Comment: daily   Sexual activity: Yes    Birth control/protection: Post-menopausal  Other Topics Concern   Not on file  Social History Narrative   Not on file   Social Drivers of Health   Financial Resource Strain: Low Risk  (05/12/2023)   Overall Financial Resource Strain (CARDIA)    Difficulty of Paying Living Expenses: Not hard at all  Food Insecurity: No Food Insecurity (05/12/2023)   Hunger Vital Sign    Worried About Running Out of Food in the Last Year: Never true    Ran Out of Food in  the Last Year: Never true  Transportation Needs: No Transportation Needs (05/12/2023)   PRAPARE - Administrator, Civil Service (Medical): No    Lack of Transportation (Non-Medical): No  Physical Activity: Sufficiently Active (05/12/2023)   Exercise Vital Sign    Days of Exercise per Week: 7 days    Minutes of Exercise per Session: 60 min  Stress: No Stress Concern Present (05/12/2023)   [employer] of Occupational Health - Occupational Stress Questionnaire    Feeling of Stress : Only a little  Social Connections: Moderately Integrated (05/12/2023)   Social Connection and Isolation Panel [NHANES]    Frequency of Communication with Friends and Family: Three times a week    Frequency of Social Gatherings with Friends and Family: Once a week    Attends Religious Services: More than 4 times per year    Active Member of [employer] or Organizations: No    Attends Banker Meetings: Not on file    Marital Status: Married  Catering manager Violence: Not At Risk (05/27/2022)   Humiliation, Afraid, Rape, and Kick questionnaire    Fear of Current or Ex-Partner: No    Emotionally Abused: No    Physically Abused: No    Sexually Abused: No    FAMILY HISTORY: Family History  Problem Relation Age of Onset   Lung cancer Mother 64   Hypertension  Mother    Cancer Mother    Lupus Father    Leukemia Cousin 6   Early death Sister    Breast cancer Neg Hx     ALLERGIES:  is allergic to poison ivy extract.  MEDICATIONS:  Current Outpatient Medications  Medication Sig Dispense Refill   ADVANCED FIBER COMPLEX PO Take 3 tablets by mouth daily. Gummies     alendronate (FOSAMAX) 70 MG tablet Take 70 mg by mouth once a week.     buPROPion  (WELLBUTRIN  XL) 150 MG 24 hr tablet Take 1 tablet (150 mg total) by mouth daily. 90 tablet 1   busPIRone  (BUSPAR ) 5 MG tablet Take 1 tablet (5 mg total) by mouth 2 (two) times daily as needed. 180 tablet 1   Calcium  Carb-Cholecalciferol (CALCIUM   600 + D) 600-5 MG-MCG TABS 1 orally twice a day     dicyclomine  (BENTYL ) 10 MG capsule Take 1 capsule (10 mg total) by mouth 3 (three) times daily as needed for up to 10 days for spasms. 30 capsule 0   HYDROcodone -acetaminophen  (NORCO) 5-325 MG tablet Take 1 tablet by mouth every 6 (six) hours as needed for up to 6 doses for moderate pain. 6 tablet 0   ibuprofen  (ADVIL ) 600 MG tablet Take 1 tablet (600 mg total) by mouth every 6 (six) hours as needed. 30 tablet 0   lidocaine -prilocaine  (EMLA ) cream Apply to affected area once 30 g 3   LORazepam  (ATIVAN ) 0.5 MG tablet Take 1 tablet (0.5 mg total) by mouth every 8 (eight) hours. 30 tablet 0   magic mouthwash (multi-ingredient) oral suspension Take 5 mLs by mouth 4 (four) times daily as needed. 300 mL 0   metroNIDAZOLE  (METROCREAM ) 0.75 % cream Apply topically as needed.     mupirocin  cream (BACTROBAN ) 2 % Apply 1 Application topically 2 (two) times daily. Apply to skin of nails twice a day for chemotherapy nail changes. 30 g 0   ondansetron  (ZOFRAN ) 8 MG tablet Take 1 tablet (8 mg total) by mouth every 8 (eight) hours as needed for nausea or vomiting. 30 tablet 1   OVER THE COUNTER MEDICATION 1.5 tablets in the morning and at bedtime. Calcium  Bone Strength Supplement1     OVER THE COUNTER MEDICATION 2 tablets daily. Vitex Berry     OVER THE COUNTER MEDICATION in the morning, at noon, and at bedtime. Milk Thisle Seeds Tea     oxybutynin  (DITROPAN  XL) 5 MG 24 hr tablet Take 1 tablet (5 mg total) by mouth at bedtime. 90 tablet 1   prochlorperazine  (COMPAZINE ) 10 MG tablet Take 1 tablet (10 mg total) by mouth every 6 (six) hours as needed for nausea or vomiting. 30 tablet 1   triamcinolone  cream (KENALOG ) 0.1 % Apply 1 Application topically 2 (two) times daily. Apply to nail plates twice a day for nail lifting due to chemotherapy. 28 g 0   No current facility-administered medications for this visit.   Facility-Administered Medications Ordered in Other  Visits  Medication Dose Route Frequency Provider Last Rate Last Admin   trastuzumab -anns (KANJINTI ) 300 mg in sodium chloride  0.9 % 250 mL chemo infusion  6 mg/kg (Treatment Plan Recorded) Intravenous Once Adric Wrede, MD        REVIEW OF SYSTEMS:   Pertinent information mentioned in HPI All other systems were reviewed with the patient and are negative.  PHYSICAL EXAMINATION: ECOG PERFORMANCE STATUS: 0 - Asymptomatic  Vitals:   07/25/23 0949  BP: 118/72  Pulse: 81  Resp:  18  Temp: 97.6 F (36.4 C)  SpO2: 100%          Filed Weights   07/25/23 0949  Weight: 113 lb 12.8 oz (51.6 kg)          GENERAL:alert, no distress and comfortable SKIN: skin color, texture, turgor are normal, no rashes or significant lesions EYES: normal, conjunctiva are pink and non-injected, sclera clear OROPHARYNX:no exudate, no erythema and lips, buccal mucosa, and tongue normal  NECK: supple, thyroid normal size, non-tender, without nodularity LYMPH:  no palpable lymphadenopathy in the cervical, axillary or inguinal LUNGS: clear to auscultation and percussion with normal breathing effort HEART: regular rate & rhythm and no murmurs and no lower extremity edema ABDOMEN:abdomen soft, non-tender and normal bowel sounds Musculoskeletal:no cyanosis of digits and no clubbing  PSYCH: alert & oriented x 3 with fluent speech NEURO: no focal motor/sensory deficits  LABORATORY DATA:  I have reviewed the data as listed Lab Results  Component Value Date   WBC 7.5 07/25/2023   HGB 13.1 07/25/2023   HCT 39.9 07/25/2023   MCV 87.7 07/25/2023   PLT 243 07/25/2023   Recent Labs    05/02/23 0847 06/13/23 0839 07/25/23 0916  NA 139 137 137  K 3.8 3.7 3.8  CL 106 104 106  CO2 25 24 23   GLUCOSE 109* 104* 109*  BUN 13 16 17   CREATININE 0.85 0.85 0.80  CALCIUM  9.0 8.5* 8.7*  GFRNONAA >60 >60 >60  PROT 7.0 6.5 6.6  ALBUMIN 3.9 3.9 3.9  AST 19 18 20   ALT 15 14 17   ALKPHOS 47 45 46   BILITOT 0.9 0.6 0.7    RADIOGRAPHIC STUDIES: I have personally reviewed the radiological images as listed and agreed with the findings in the report. No results found.

## 2023-07-25 NOTE — Patient Instructions (Signed)

## 2023-07-25 NOTE — Progress Notes (Signed)
 Patient is doing well, today is her last treatment. She would like to know when her next visit with an oncologist would be. She would like to start seeing Dr. Randy Buttery when Dr. Aris Bel leaves.

## 2023-08-08 ENCOUNTER — Encounter: Payer: Self-pay | Admitting: Internal Medicine

## 2023-08-08 DIAGNOSIS — R6882 Decreased libido: Secondary | ICD-10-CM

## 2023-08-08 DIAGNOSIS — N941 Unspecified dyspareunia: Secondary | ICD-10-CM

## 2023-08-08 DIAGNOSIS — Z780 Asymptomatic menopausal state: Secondary | ICD-10-CM

## 2023-08-09 ENCOUNTER — Telehealth: Payer: Self-pay

## 2023-08-09 NOTE — Telephone Encounter (Signed)
 Copied from CRM [id number]. Topic: General - Other >> Aug 09, 2023  1:07 PM Jennifer Garrison S wrote: Reason for CRM: Pt called to schedule hormone testing and labs for the testing

## 2023-08-14 ENCOUNTER — Other Ambulatory Visit

## 2023-08-14 DIAGNOSIS — R6882 Decreased libido: Secondary | ICD-10-CM

## 2023-08-14 DIAGNOSIS — N941 Unspecified dyspareunia: Secondary | ICD-10-CM

## 2023-08-14 DIAGNOSIS — Z780 Asymptomatic menopausal state: Secondary | ICD-10-CM

## 2023-08-15 ENCOUNTER — Encounter: Payer: Self-pay | Admitting: Podiatry

## 2023-08-15 ENCOUNTER — Ambulatory Visit (INDEPENDENT_AMBULATORY_CARE_PROVIDER_SITE_OTHER): Admitting: Podiatry

## 2023-08-15 ENCOUNTER — Encounter: Payer: Self-pay | Admitting: Nurse Practitioner

## 2023-08-15 ENCOUNTER — Ambulatory Visit: Payer: Self-pay | Admitting: Internal Medicine

## 2023-08-15 DIAGNOSIS — S90221A Contusion of right lesser toe(s) with damage to nail, initial encounter: Secondary | ICD-10-CM | POA: Diagnosis not present

## 2023-08-15 NOTE — Progress Notes (Signed)
 Chief Complaint  Patient presents with   Toe Pain    "My toenail turned black from Chemo.  My toenail started to grow out.  It was white at the bottom then it started to change color." N - toenail black L - hallux right D - 8-10 mos O - gradually worse C - black, thick, sensitive sometime A - sheet touching at times T - none    HPI: 63 y.o. female presenting today as a new patient for evaluation of thick discoloration to the right hallux nail plate.  She believes it is possibly from her chemotherapy.  It can be sensitive at times.  Presenting for further treatment evaluation  Past Medical History:  Diagnosis Date   Allergy 1970   Anxiety 2000   Arthritis 2021   Breast cancer (HCC) 05/27/2022   Cancer (HCC) 05/11/2022   Breast stage A1   Depression 2000   IBS (irritable bowel syndrome)    Trigger finger    Left Middle Finger 11/2016    Past Surgical History:  Procedure Laterality Date   ABDOMINAL HYSTERECTOMY  02/2016   Only cervix remains (done for prolapse)   ABDOMINAL HYSTERECTOMY  2017   AUGMENTATION MAMMAPLASTY Bilateral 2010   BREAST BIOPSY Left 05/17/2022   US  BX, ribbon clip, path pending   BREAST BIOPSY Left 05/17/2022   US  LT BREAST BX W LOC DEV 1ST LESION IMG BX SPEC US  GUIDE 05/17/2022 [facility]   BREAST CYST ASPIRATION Left 2010   neg   BREAST ENHANCEMENT SURGERY  1998   CHOLECYSTECTOMY  1991   COLONOSCOPY WITH PROPOFOL  N/A 10/10/2019   Procedure: COLONOSCOPY WITH PROPOFOL ;  Surgeon: Luke Salaam, MD;  Location: [facility] ENDOSCOPY;  Service: Gastroenterology;  Laterality: N/A;   PARTIAL MASTECTOMY WITH AXILLARY SENTINEL LYMPH NODE BIOPSY Left 06/09/2022   Procedure: PARTIAL MASTECTOMY WITH AXILLARY SENTINEL LYMPH NODE BIOPSY;  Surgeon: Conrado Delay, DO;  Location: [facility] ORS;  Service: General;  Laterality: Left;   PORTACATH PLACEMENT N/A 06/30/2022   Procedure: INSERTION PORT-A-CATH;  Surgeon: Conrado Delay, DO;  Location: [facility] ORS;  Service: General;   Laterality: N/A;   TYMPANOPLASTY Left     Allergies  Allergen Reactions   Poison Ivy Extract Rash     Physical Exam: General: The patient is alert and oriented x3 in no acute distress.  Dermatology: Skin is warm, dry and supple bilateral lower extremities.  Hyperkeratotic discolored nail noted with dried subungual hematoma consistent with history of trauma.  It does not appear to be associated to chemotherapy  Vascular: Palpable pedal pulses bilaterally. Capillary refill within normal limits.  No appreciable edema.  No erythema.  Neurological: Grossly intact via light touch  Musculoskeletal Exam: No pedal deformities noted   Assessment/Plan of Care: 1.  Nail dystrophy with old subungual hematoma likely secondary to trauma  -Debridement of the nails performed today using a nail nipper without incident or bleeding back to healthy nail. -Explained to the patient that I do believe the toenail dystrophy is associated with a history of trauma versus chemotherapy.  The base of the nail appears to be healthy and viable.  It should grow out uneventfully -Topical Tolcylen antifungal nail rejuvenation topical was provided for the patient to apply daily -Return to clinic as needed       Dot Gazella, DPM [facility]  Dr. Dot Gazella, DPM    [address]  [place], [place] [address]                Office [phone]  Fax [phone]

## 2023-08-16 ENCOUNTER — Encounter: Payer: Self-pay | Admitting: Nurse Practitioner

## 2023-08-16 ENCOUNTER — Inpatient Hospital Stay: Attending: Internal Medicine | Admitting: Nurse Practitioner

## 2023-08-16 ENCOUNTER — Telehealth: Payer: Self-pay

## 2023-08-16 VITALS — BP 121/56 | HR 51 | Temp 98.6°F | Resp 20 | Wt 119.3 lb

## 2023-08-16 DIAGNOSIS — Z90710 Acquired absence of both cervix and uterus: Secondary | ICD-10-CM | POA: Diagnosis not present

## 2023-08-16 DIAGNOSIS — C50212 Malignant neoplasm of upper-inner quadrant of left female breast: Secondary | ICD-10-CM | POA: Diagnosis not present

## 2023-08-16 DIAGNOSIS — Z9012 Acquired absence of left breast and nipple: Secondary | ICD-10-CM | POA: Insufficient documentation

## 2023-08-16 DIAGNOSIS — N39491 Coital incontinence: Secondary | ICD-10-CM | POA: Diagnosis not present

## 2023-08-16 DIAGNOSIS — Z79899 Other long term (current) drug therapy: Secondary | ICD-10-CM | POA: Insufficient documentation

## 2023-08-16 DIAGNOSIS — N952 Postmenopausal atrophic vaginitis: Secondary | ICD-10-CM | POA: Diagnosis not present

## 2023-08-16 DIAGNOSIS — Z79890 Hormone replacement therapy (postmenopausal): Secondary | ICD-10-CM | POA: Insufficient documentation

## 2023-08-16 DIAGNOSIS — Z5112 Encounter for antineoplastic immunotherapy: Secondary | ICD-10-CM | POA: Diagnosis present

## 2023-08-16 DIAGNOSIS — Z171 Estrogen receptor negative status [ER-]: Secondary | ICD-10-CM | POA: Insufficient documentation

## 2023-08-16 DIAGNOSIS — C50012 Malignant neoplasm of nipple and areola, left female breast: Secondary | ICD-10-CM | POA: Diagnosis present

## 2023-08-16 NOTE — Telephone Encounter (Signed)
 Patient was in the clinic today, she used to be a patient of Dr. Aris Bel, so we know each other very well. She saw me out in the lobby, we spoke and she told me that she is here to see Belia Bowl about her libido being gone. She told me that when her and her husband go to have intercourse it hurts. Plus her hormones are off s well.

## 2023-08-16 NOTE — Progress Notes (Signed)
 Symptom Management Clinic  [facility] at [facility] A Department of the [place]. [facility] [address] [place], [place] [address] [phone] (phone) [phone] (fax)  Patient Care Team: [place], Rankin Buzzard, NP as PCP - General (Internal Medicine) Wenona Hamilton, MD as PCP - Cardiology (Cardiology) Waverly Hageman, RN as Oncology Nurse Navigator Loreatha Rodney, MD as Consulting Physician (Oncology) Glenis Langdon, MD as Consulting Physician (Radiation Oncology) Conrado Delay, DO as Consulting Physician (General Surgery)   Name of the patient: Jennifer Garrison  [id number]  1961-03-25   Date of visit: 08/16/23  Diagnosis- History of breast cancer   Chief complaint/ Reason for visit- Coital incontinence, vaginal atrophy  Heme/Onc history:  Oncology History  Breast cancer (HCC)  05/11/2022 Mammogram   Diagnostic mammogram and ultrasound- FINDINGS: Full field and spot compression views of the LEFT breast demonstrate a 1 cm irregular mass within the UPPER INNER LEFT breast, at the site of patient concern. The patient has retropectoral implants.   On physical exam, a firm palpable mass identified at the 11 o'clock position of the LEFT breast 8 cm from the nipple.   Targeted ultrasound is performed, showing a 0.9 x 0.8 x 1 cm irregular hypoechoic mass at the 11 o'clock position of the LEFT breast 8 cm from the nipple.   No abnormal LEFT axillary lymph nodes are noted.   IMPRESSION: 1. Suspicious 1 cm UPPER INNER LEFT breast mass. Tissue sampling is recommended. 2. No abnormal appearing LEFT axillary lymph nodes.   05/17/2022 Cancer Staging   Staging form: Breast, AJCC 8th Edition - Clinical stage from 05/17/2022: Stage IA (cT1b, cN0, cM0, G3, ER-, PR-, HER2+) - Signed by Loreatha Rodney, MD on 05/27/2022 Stage prefix: Initial diagnosis Histologic grading system: 3 grade system   05/17/2022 Initial Biopsy   DIAGNOSIS:  A.  BREAST, LEFT, 11:00, 8 CM FROM NIPPLE, ULTRASOUND-GUIDED BIOPSY:   - INVASIVE MAMMARY CARCINOMA, NOS DUCTAL.   Size of invasive carcinoma: 10 mm in this sample  Histologic grade of invasive carcinoma: Grade 3                       Glandular/tubular differentiation score: 3                       Nuclear pleomorphism score: 3                       Mitotic rate score: 2                       Total score: 8  Ductal carcinoma in situ: Not identified  Lymphovascular invasion: Not identified   Estrogen Receptor (ER) Status: NEGATIVE (LESS THAN 1%)   Progesterone  Receptor (PgR) Status: NEGATIVE (LESS THAN 1%)   HER2 (by immunohistochemistry): POSITIVE (Score 3+)       Percentage of cells with uniform intense complete membrane  staining: 80%  Ki-67: Not performed     Genetic Testing   No pathogenic variants identified on the [facility] Multi-Cancer+RNA panel. VUS in MUTYH called c.1484G>A identified. The report date is 06/09/2022.  The Multi-Cancer + RNA Panel offered by [facility] includes sequencing and/or deletion/duplication analysis of the following 70 genes:  AIP*, ALK, APC*, ATM*, AXIN2*, BAP1*, BARD1*, BLM*, BMPR1A*, BRCA1*, BRCA2*, BRIP1*, CDC73*, CDH1*, CDK4, CDKN1B*, CDKN2A, CHEK2*, CTNNA1*, DICER1*, EPCAM, EGFR, FH*, FLCN*, GREM1, HOXB13, KIT, LZTR1, MAX*, MBD4, MEN1*,  MET, MITF, MLH1*, MSH2*, MSH3*, MSH6*, MUTYH*, NF1*, NF2*, NTHL1*, PALB2*, PDGFRA, PMS2*, POLD1*, POLE*, POT1*, PRKAR1A*, PTCH1*, PTEN*, RAD51C*, RAD51D*, RB1*, RET, SDHA*, SDHAF2*, SDHB*, SDHC*, SDHD*, SMAD4*, SMARCA4*, SMARCB1*, SMARCE1*, STK11*, SUFU*, TMEM127*, TP53*, TSC1*, TSC2*, VHL*. RNA analysis is performed for * genes.   06/09/2022 Surgery   S/p left breast lumpectomy with SLNB by Dr. Rosea Conch  Pathology showed 1.4 cm IDC, grade 3, DCIS present grade 2-3 with comedonecrosis, 0/3 lymph nodes negative, margins negative, pT1c.  ER/PR negative.  HER2 positive   07/18/2022 -  Chemotherapy   Patient is on Treatment Plan :  BREAST Paclitaxel  + Trastuzumab  q7d / Trastuzumab  q21d       Interval history- Jennifer Garrison is a 63 y.o. female with above history of IDC, grade c, ER/PR negative, HER2 positive breast cancer s/p lumpectomy followed by paclitaxel  + trastuzumab  follwoed by trastuzumab , completed 07/25/23, who presents to symptom management clinic for complaints of vaginal atrophy and incontinence during intercourse. Atrophy has been present prior to treatment for cancer and persists. Possibly somewhat worse. She feels that incontinence is stable and ongoing to slightly worse as well. She notices decreases in her energy level and libido.   Review of systems- Review of Systems  Constitutional:  Positive for malaise/fatigue. Negative for chills, fever and weight loss.  Eyes:  Negative for blurred vision and double vision.  Respiratory:  Negative for shortness of breath.   Cardiovascular:  Negative for chest pain and palpitations.  Gastrointestinal:  Negative for abdominal pain, blood in stool, constipation, diarrhea, melena and vomiting.  Genitourinary:  Negative for dysuria, flank pain, frequency, hematuria and urgency.  Musculoskeletal:  Negative for back pain, falls, joint pain and myalgias.  Skin:  Negative for itching and rash.  Neurological:  Negative for dizziness, tingling, sensory change, loss of consciousness, weakness and headaches.  Endo/Heme/Allergies:  Negative for environmental allergies. Does not bruise/bleed easily.  Psychiatric/Behavioral:  Negative for depression. The patient is nervous/anxious. The patient does not have insomnia.     Current treatment- surveillance  Allergies  Allergen Reactions   Poison Ivy Extract Rash    Past Medical History:  Diagnosis Date   Allergy 1970   Anxiety 2000   Arthritis 2021   Breast cancer (HCC) 05/27/2022   Cancer (HCC) 05/11/2022   Breast stage A1   Depression 2000   IBS (irritable bowel syndrome)    Trigger finger    Left Middle Finger 11/2016     Past Surgical History:  Procedure Laterality Date   ABDOMINAL HYSTERECTOMY  02/2016   Only cervix remains (done for prolapse)   ABDOMINAL HYSTERECTOMY  2017   AUGMENTATION MAMMAPLASTY Bilateral 2010   BREAST BIOPSY Left 05/17/2022   US  BX, ribbon clip, path pending   BREAST BIOPSY Left 05/17/2022   US  LT BREAST BX W LOC DEV 1ST LESION IMG BX SPEC US  GUIDE 05/17/2022 [facility]   BREAST CYST ASPIRATION Left 2010   neg   BREAST ENHANCEMENT SURGERY  1998   CHOLECYSTECTOMY  1991   COLONOSCOPY WITH PROPOFOL  N/A 10/10/2019   Procedure: COLONOSCOPY WITH PROPOFOL ;  Surgeon: Luke Salaam, MD;  Location: [facility];  Service: Gastroenterology;  Laterality: N/A;   PARTIAL MASTECTOMY WITH AXILLARY SENTINEL LYMPH NODE BIOPSY Left 06/09/2022   Procedure: PARTIAL MASTECTOMY WITH AXILLARY SENTINEL LYMPH NODE BIOPSY;  Surgeon: Conrado Delay, DO;  Location: [facility] ORS;  Service: General;  Laterality: Left;   PORTACATH PLACEMENT N/A 06/30/2022   Procedure: INSERTION PORT-A-CATH;  Surgeon: Conrado Delay, DO;  Location: [facility] ORS;  Service: General;  Laterality: N/A;   TYMPANOPLASTY Left    Social History   Socioeconomic History   Marital status: Married    Spouse name: Myrtie Atkinson   Number of children: 1   Years of education: Not on file   Highest education level: Associate degree: occupational, Scientist, product/process development, or vocational program  Occupational History   Not on file  Tobacco Use   Smoking status: Former    Current packs/day: 0.00    Types: Cigarettes    Quit date: 09/09/1992    Years since quitting: 30.9   Smokeless tobacco: Current    Types: Chew   Tobacco comments:    Got pregnant  Vaping Use   Vaping status: Every Day  Substance and Sexual Activity   Alcohol use: Not Currently   Drug use: Yes    Types: Marijuana    Comment: daily   Sexual activity: Yes    Birth control/protection: Post-menopausal  Other Topics Concern   Not on file  Social History Narrative   Not on file    Social Drivers of Health   Financial Resource Strain: Low Risk  (05/12/2023)   Overall Financial Resource Strain (CARDIA)    Difficulty of Paying Living Expenses: Not hard at all  Food Insecurity: No Food Insecurity (05/12/2023)   Hunger Vital Sign    Worried About Running Out of Food in the Last Year: Never true    Ran Out of Food in the Last Year: Never true  Transportation Needs: No Transportation Needs (05/12/2023)   PRAPARE - Administrator, Civil Service (Medical): No    Lack of Transportation (Non-Medical): No  Physical Activity: Sufficiently Active (05/12/2023)   Exercise Vital Sign    Days of Exercise per Week: 7 days    Minutes of Exercise per Session: 60 min  Stress: No Stress Concern Present (05/12/2023)   [employer] of Occupational Health - Occupational Stress Questionnaire    Feeling of Stress : Only a little  Social Connections: Moderately Integrated (05/12/2023)   Social Connection and Isolation Panel [NHANES]    Frequency of Communication with Friends and Family: Three times a week    Frequency of Social Gatherings with Friends and Family: Once a week    Attends Religious Services: More than 4 times per year    Active Member of [employer] or Organizations: No    Attends Engineer, structural: Not on file    Marital Status: Married  Catering manager Violence: Not At Risk (05/27/2022)   Humiliation, Afraid, Rape, and Kick questionnaire    Fear of Current or Ex-Partner: No    Emotionally Abused: No    Physically Abused: No    Sexually Abused: No    Family History  Problem Relation Age of Onset   Lung cancer Mother 17   Hypertension Mother    Cancer Mother    Lupus Father    Leukemia Cousin 6   Early death Sister    Breast cancer Neg Hx    Current Outpatient Medications:    ADVANCED FIBER COMPLEX PO, Take 3 tablets by mouth daily. Gummies, Disp: , Rfl:    alendronate (FOSAMAX) 70 MG tablet, Take 70 mg by mouth once a week., Disp: , Rfl:     buPROPion  (WELLBUTRIN  XL) 150 MG 24 hr tablet, Take 1 tablet (150 mg total) by mouth daily., Disp: 90 tablet, Rfl: 1   busPIRone  (BUSPAR ) 5 MG tablet, Take 1 tablet (5 mg total) by mouth 2 (two) times daily  as needed., Disp: 180 tablet, Rfl: 1   Calcium  Carb-Cholecalciferol (CALCIUM  600 + D) 600-5 MG-MCG TABS, 1 orally twice a day, Disp: , Rfl:    dicyclomine  (BENTYL ) 10 MG capsule, Take 1 capsule (10 mg total) by mouth 3 (three) times daily as needed for up to 10 days for spasms., Disp: 30 capsule, Rfl: 0   HYDROcodone -acetaminophen  (NORCO) 5-325 MG tablet, Take 1 tablet by mouth every 6 (six) hours as needed for up to 6 doses for moderate pain., Disp: 6 tablet, Rfl: 0   ibuprofen  (ADVIL ) 600 MG tablet, Take 1 tablet (600 mg total) by mouth every 6 (six) hours as needed., Disp: 30 tablet, Rfl: 0   lidocaine -prilocaine  (EMLA ) cream, Apply to affected area once, Disp: 30 g, Rfl: 3   LORazepam  (ATIVAN ) 0.5 MG tablet, Take 1 tablet (0.5 mg total) by mouth every 8 (eight) hours., Disp: 30 tablet, Rfl: 0   magic mouthwash (multi-ingredient) oral suspension, Take 5 mLs by mouth 4 (four) times daily as needed., Disp: 300 mL, Rfl: 0   metroNIDAZOLE  (METROCREAM ) 0.75 % cream, Apply topically as needed., Disp: , Rfl:    mupirocin  cream (BACTROBAN ) 2 %, Apply 1 Application topically 2 (two) times daily. Apply to skin of nails twice a day for chemotherapy nail changes., Disp: 30 g, Rfl: 0   ondansetron  (ZOFRAN ) 8 MG tablet, Take 1 tablet (8 mg total) by mouth every 8 (eight) hours as needed for nausea or vomiting., Disp: 30 tablet, Rfl: 1   OVER THE COUNTER MEDICATION, 1.5 tablets in the morning and at bedtime. Calcium  Bone Strength Supplement1, Disp: , Rfl:    OVER THE COUNTER MEDICATION, 2 tablets daily. Vitex Katheryne Pane (Patient not taking: Reported on 08/16/2023), Disp: , Rfl:    OVER THE COUNTER MEDICATION, in the morning, at noon, and at bedtime. Milk Thisle Seeds Tea (Patient not taking: Reported on 08/16/2023),  Disp: , Rfl:    oxybutynin  (DITROPAN  XL) 5 MG 24 hr tablet, Take 1 tablet (5 mg total) by mouth at bedtime., Disp: 90 tablet, Rfl: 1   prochlorperazine  (COMPAZINE ) 10 MG tablet, Take 1 tablet (10 mg total) by mouth every 6 (six) hours as needed for nausea or vomiting., Disp: 30 tablet, Rfl: 1   triamcinolone  cream (KENALOG ) 0.1 %, Apply 1 Application topically 2 (two) times daily. Apply to nail plates twice a day for nail lifting due to chemotherapy., Disp: 28 g, Rfl: 0  Physical exam:  Vitals:   08/16/23 1105  BP: (!) 121/56  Pulse: (!) 51  Resp: 20  Temp: 98.6 F (37 C)  SpO2: 100%  Weight: 119 lb 4.8 oz (54.1 kg)   Physical Exam Vitals reviewed.  Constitutional:      Appearance: She is not ill-appearing.   Skin:    Coloration: Skin is not pale.   Neurological:     Mental Status: She is alert and oriented to person, place, and time.   Psychiatric:        Mood and Affect: Mood normal.        Behavior: Behavior normal.     Assessment and plan- Patient is a 63 y.o. female   Vaginal Atrophy- we discussed that vulvar dryness manifests as burning, itching, and dyspareunia but should not cause lesions/rash. Symptoms are often related to hypoestrogen state and symptoms are common secondary to hormonal changes and/or treatments for malignancy. We discussed the differences in vulvar moisturizers vs lubricants. We discussed nonhormonal vaginal moisturizers today and I provided her with list of  options. Also recommend trial of topical estrogen- estrace , to help improve vaginal symptoms. May improve some plumping of the tissue and improve her symptoms. There is some systemic absorption however, low risk compared to systemic HRT which is contraindicated even in HR negative malignancy. Patient may require use of these products lifelong given post menopausal state. Also discussed vaginal lubricants for sexual activity. lubricants make things slippery and don't absorb into the tissue. Moisturizers  do absorb. Water  based lubricants tend to be less irritating and are compatible with condoms. Silicone lubricants are typically more slippery but harder for vagina to clear & therefore irritating. Again, coconut oil is a good option here as well though not condom friendly. Will refer to gynecology for ongoing management.  Coital incontinence- hx of hysterectomy for uterine prolapse in 2017. Question bladder prolapse vs others? She is performing lifestyle interventions including urinating before intercouse then experiences incontinence with moderate volume of retained urine after insertion. Briefly discussed options including exam, pelvic floor PT, pessary to support bladder and assess fully emptying vs surgery. Recommend evaluation and management with uro-gyn. Patient in agreement.  Left breast invasive ductal carcinoma, stage IA ER/PR negative, HER2 positive- discussed that while her malignancy has been ER/PR/hormonal negative, use of estrogen containing products/HRT/systemic HT are contraindicated and have been shown to have increased risk of recurrent disease.  Libido issues- recommend exercise as this works best. Continue bupropion  SR 150 mg daily. Can consider referral to sex therapist for cognitive behavioral therapy (AASECT certified recommended).    Disposition: Will plan to see her back in 2-3 months after she has started vaginal estrogen for follow up Refer to gynecology. If she sees them sooner then we can cancel f/u with me Refer to uro-gyn- [place]  Visit Diagnosis 1. Coital urinary incontinence   2. Vaginal atrophy    Patient expressed understanding and was in agreement with this plan. She also understands that She can call clinic at any time with any questions, concerns, or complaints.   Thank you for allowing me to participate in the care of this very pleasant patient.   Kenney Peacemaker, DNP, AGNP-C, AOCNP [facility] [phone]

## 2023-08-18 ENCOUNTER — Encounter: Payer: Self-pay | Admitting: Nurse Practitioner

## 2023-08-18 MED ORDER — ESTRADIOL 0.1 MG/GM VA CREA: TOPICAL_CREAM | VAGINAL | 2 refills | Status: DC

## 2023-08-22 ENCOUNTER — Other Ambulatory Visit: Payer: Self-pay | Admitting: Internal Medicine

## 2023-08-22 DIAGNOSIS — C50912 Malignant neoplasm of unspecified site of left female breast: Secondary | ICD-10-CM

## 2023-08-23 ENCOUNTER — Encounter: Payer: Self-pay | Admitting: Internal Medicine

## 2023-08-24 LAB — TESTOSTERONE, FREE: TESTOSTERONE FREE: 0.7 pg/mL (ref 0.2–5.0)

## 2023-08-24 LAB — ESTROGENS, TOTAL: Estrogen: 35 pg/mL

## 2023-08-24 LAB — ESTRIOL, LC/MS/MS: Estriol: <0.1 ng/mL

## 2023-08-24 LAB — PROGESTERONE: Progesterone: <0.5 ng/mL

## 2023-08-24 LAB — ESTRADIOL: Estradiol: <15 pg/mL

## 2023-08-24 NOTE — Progress Notes (Unsigned)
 Cardiology Clinic Note   Date: 08/25/2023 ID: Jennifer Garrison, DOB 26-Jul-1960, MRN [id number]  Primary Cardiologist:  Antionette Kirks, MD  Chief Complaint   [name] is a 63 y.o. female who presents to the clinic today for routine follow up.   Patient Profile   Jennifer Garrison is followed by Dr. Alvenia Aus for the history outlined below.      Past medical history significant for: Cardiomyopathy. Echo 06/07/2023: EF 50 to 55%.  No RWMA.  Indeterminate diastolic parameters.  Normal global strain.  Normal RV size/function.  Mild MR/AI.  Aortic valve sclerosis without stenosis. Breast cancer.  In summary, patient was first evaluated by Dr. Alvenia Aus on 02/22/2023 for cardiomyopathy.  She has a history of breast cancer s/p lumpectomy in March 2024.  She completed weekly Taxol  and Herceptin  and is now on Herceptin  every 3 weeks for total of 1 year.  She completed radiation therapy in August.  Echo in April 2024 demonstrated EF 50 to 55%.  Repeat echo July 2024 showed EF 55 to 60%.  Her next echo in November 2024 demonstrated EF 45 to 50%, global hypokinesis, Grade I DD, normal RV size/function, trivial MR/AI, aortic valve sclerosis without stenosis.  Patient denied chest pain, shortness of breath, lower extremity edema, orthopnea, PND.  She was never smoker and drinks alcohol socially.  It was felt given her lack of symptoms that medications were not indicated at that time.  Plan to hold Herceptin  until repeat echo in December.  If EF > 50% she can resume Herceptin  treatments.  Repeat echo December 2024 normalized with EF 50 to 55%.  Latest echo March 2025 showed EF 50 to 55% as detailed above.     History of Present Illness    Today, patient reports she completed her last Herceptin  treatment on 4/29 and had her port removed on 5/5. Her last echo was in March and stable LV function. She has a visit with a new oncologist (her oncologist left the practice) and will discuss long term plan. Patient denies  shortness of breath, dyspnea on exertion, lower extremity edema, orthopnea or PND. No chest pain, pressure, or tightness. No palpitations. She is active walking her dog several times a day and doing household tasks.     ROS: All other systems reviewed and are otherwise negative except as noted in History of Present Illness.  EKGs/Labs Reviewed    EKG Interpretation Date/Time:  Friday Aug 25 2023 08:58:37 EDT Ventricular Rate:  53 PR Interval:  136 QRS Duration:  90 QT Interval:  434 QTC Calculation: 407 R Axis:   61  Text Interpretation: Sinus bradycardia with Premature atrial complexes When compared with ECG of 22-Feb-2023 10:54, Premature atrial complexes are now Present Confirmed by Morey Ar [phone]) on 08/25/2023 9:08:39 AM   07/25/2023: ALT 17; AST 20; BUN 17; Creatinine, Ser 0.80; Potassium 3.8; Sodium 137   07/25/2023: Hemoglobin 13.1; WBC 7.5    Physical Exam    VS:  BP 110/72 (BP Location: Right Arm, Patient Position: Sitting, Cuff Size: Normal)   Pulse (!) 53   Ht 5\' 4"  (1.626 m)   Wt 116 lb 12.8 oz (53 kg)   SpO2 97%   BMI 20.05 kg/m  , BMI Body mass index is 20.05 kg/m.  GEN: Well nourished, well developed, in no acute distress. Neck: No JVD or carotid bruits. Cardiac:  RRR. No murmurs. No rubs or gallops.   Respiratory:  Respirations regular and unlabored. Clear to auscultation without rales, wheezing or  rhonchi. GI: Soft, nontender, nondistended. Extremities: Radials/DP/PT 2+ and equal bilaterally. No clubbing or cyanosis. No edema.  Skin: Warm and dry, no rash. Neuro: Strength intact.  Assessment & Plan   Cardiomyopathy S/p Herceptin  treatment.  Echo March 2025 showed EF 50 to 55%, no RWMA, indeterminate diastolic parameters, normal global strain, normal RV size/function, mild MR/AI, aortic valve sclerosis without stenosis.  Patient has completed her breast cancer treatment. She had her last Herceptin  treatment on 4/29 and her port removed on 5/5.  She has an upcoming appointment with a new oncologist to discuss long term plans. She is active walking her dog several times a day and doing activities around her home. Now that she is done with treatment she would like to follow up as needed and I agree that is appropriate.  - Contact office with any concerns.   Disposition: Follow up as needed.          Signed, Lonell Rives. Lela Gell, DNP, NP-C

## 2023-08-25 ENCOUNTER — Encounter: Payer: Self-pay | Admitting: Student

## 2023-08-25 ENCOUNTER — Ambulatory Visit: Attending: Student | Admitting: Student

## 2023-08-25 VITALS — BP 110/72 | HR 53 | Ht 64.0 in | Wt 116.8 lb

## 2023-08-25 DIAGNOSIS — I427 Cardiomyopathy due to drug and external agent: Secondary | ICD-10-CM

## 2023-08-25 NOTE — Patient Instructions (Signed)
 Medication Instructions:  Your Physician recommend you continue on your current medication as directed.    *If you need a refill on your cardiac medications before your next appointment, please call your pharmacy*  Lab Work: None ordered at this time  If you have labs (blood work) drawn today and your tests are completely normal, you will receive your results only by: MyChart Message (if you have MyChart) OR A paper copy in the mail If you have any lab test that is abnormal or we need to change your treatment, we will call you to review the results.  Testing/Procedures: None ordered at this time   Follow-Up: At [facility], you and your health needs are our priority.  As part of our continuing mission to provide you with exceptional heart care, our providers are all part of one team.  This team includes your primary Cardiologist (physician) and Advanced Practice Providers or APPs (Physician Assistants and Nurse Practitioners) who all work together to provide you with the care you need, when you need it.  Your next appointment:    As Needed  Provider:   You may see Antionette Kirks, MD or one of the following Advanced Practice Providers on your designated Care Team:   Laneta Pintos, NP Gildardo Labrador, PA-C Varney Gentleman, PA-C Cadence Villard, PA-C Ronald Cockayne, NP Morey Ar, NP    We recommend signing up for the patient portal called "MyChart".  Sign up information is provided on this After Visit Summary.  MyChart is used to connect with patients for Virtual Visits (Telemedicine).  Patients are able to view lab/test results, encounter notes, upcoming appointments, etc.  Non-urgent messages can be sent to your provider as well.   To learn more about what you can do with MyChart, go to [email or web address].

## 2023-09-05 ENCOUNTER — Other Ambulatory Visit: Payer: Self-pay | Admitting: Internal Medicine

## 2023-09-06 ENCOUNTER — Other Ambulatory Visit: Payer: Self-pay

## 2023-09-07 NOTE — Telephone Encounter (Signed)
 Requested Prescriptions  Pending Prescriptions Disp Refills   busPIRone  (BUSPAR ) 5 MG tablet [Pharmacy Med Name: BUSPIRONE  HCL TABS 5MG ] 180 tablet 0    Sig: TAKE 1 TABLET TWICE A DAY AS NEEDED     Psychiatry: Anxiolytics/Hypnotics - Non-controlled Passed - 09/07/2023 11:39 AM      Passed - Valid encounter within last 12 months    Recent Outpatient Visits           3 months ago Encounter for general adult medical examination with abnormal findings   [place] [facility] Bricelyn, Rankin Buzzard, NP       Future Appointments             In 2 months Baity, Rankin Buzzard, NP [place] [facility]   In 2 months Zuleta, Kaitlin G, NP [place] [facility], [facility]

## 2023-09-13 ENCOUNTER — Telehealth: Payer: Self-pay | Admitting: *Deleted

## 2023-09-13 ENCOUNTER — Encounter: Payer: Self-pay | Admitting: Internal Medicine

## 2023-09-13 MED ORDER — ESTRADIOL 0.1 MG/GM VA CREA: TOPICAL_CREAM | VAGINAL | 2 refills | Status: DC

## 2023-09-13 NOTE — Telephone Encounter (Signed)
 Got a call from [facility] DELIVERY about the estradiol  cream.  Patient got cream on May 24.  And now she would be on the doing one third cream in the vagina area 3 times a week.  I did have to call Lauren just to make sure that all that is correct in the express scripts changed so that it goes 1/3 to go in deep vaginal 3 times a week.

## 2023-09-13 NOTE — Patient Instructions (Signed)
 Vulvovaginal moisturizer Options: Vitamin E oil (pump or capsule) or cream (Gene's Vit E Cream) Coconut oil Silicone-based lubricant for use during intercourse (wet platinum is a brand available at most drugstores) Crisco Consider the ingredients of the product - the fewer the ingredients the better!  Directions for Use: Clean and dry your hands Gently dab the vulvar/vaginal area dry as needed Apply a "pea-sized" amount of the moisturizer onto your fingertip Using you other hand, open the labia  Apply the moisturizer to the vulvar/vaginal tissues Wear loose fitting underwear/clothing if possible following application Use moisturize up to 3 times daily as desired.   I've sent a prescription for vaginal estrogen to your pharmacy.

## 2023-10-02 ENCOUNTER — Encounter: Payer: Self-pay | Admitting: Nurse Practitioner

## 2023-10-02 ENCOUNTER — Telehealth: Payer: Self-pay | Admitting: *Deleted

## 2023-10-02 NOTE — Telephone Encounter (Signed)
 Called patient to discuss her concerns that were sent via mychart. Pt has an apt with Lauren on 7/29. She would like a sooner apt to discuss her testosterone  level of 0.7 and lack of libido. She is traveling to [place] with her husband on 8/2 and wanted to get started on Testosterone  as soon as possible. She wants to feel a bit more like myself before then. [facility] apt offered this week. Lauren, NP is on vacation next week. Patient declined an apt and stated that she is currently in [place] until Sunday. Patent requesting a mychart visit today or as soon as possible.  I explained to her that if she is out of town in another state, we are unable to do virtual visits. This is a Heritage manager for our providers. I explained to her that providers must be licensed in the state where the patient is presently located. She was very frustrated and disappointed at this and stated that she never had this issue with [facility]. I explained to her that this is a Social worker. Lauren is not licensed to practice in [place] and we must adhere to the laws regarding licensing and telehealth registration requirements.  Patient then asked if she could have a script sent in for the testosterone  asap and have the pharmacist advise her on the medication. I advised patient again that Lauren recommended an in-person visit to discuss her medication options/treatment. The NP would not prescribe a treatment until patient is evaluated. She asked if another APP would be able to see her next week since Lauren, NP is out of the office. I explained to her that we do not have another APP available next week for her gyn health concerns. We are happy to see her sooner the following week on 7/22 or 7/24. Pt accepted an apt on 7/22 at 1015 am with Lauren.

## 2023-10-06 ENCOUNTER — Telehealth: Payer: Self-pay | Admitting: *Deleted

## 2023-10-06 MED ORDER — ESTRADIOL 0.1 MG/GM VA CREA: TOPICAL_CREAM | VAGINAL | 2 refills | Status: DC

## 2023-10-06 NOTE — Telephone Encounter (Signed)
 [facility] request to have estradiol  script to e-scribed via mail order pharmacy.

## 2023-10-12 ENCOUNTER — Telehealth: Payer: Self-pay | Admitting: *Deleted

## 2023-10-12 NOTE — Telephone Encounter (Signed)
 They called from [facility] saying that there is a little issue in the way morning put it in but after about 4 minutes the lady said that they just took it different way and everything is fine.  And they are sending it out to her

## 2023-10-17 ENCOUNTER — Inpatient Hospital Stay: Attending: Internal Medicine | Admitting: Nurse Practitioner

## 2023-10-17 ENCOUNTER — Encounter: Payer: Self-pay | Admitting: Nurse Practitioner

## 2023-10-17 ENCOUNTER — Telehealth: Payer: Self-pay | Admitting: *Deleted

## 2023-10-17 VITALS — BP 130/63 | HR 57 | Temp 98.8°F | Resp 20 | Wt 112.6 lb

## 2023-10-17 DIAGNOSIS — Z7182 Exercise counseling: Secondary | ICD-10-CM | POA: Diagnosis not present

## 2023-10-17 DIAGNOSIS — Z9012 Acquired absence of left breast and nipple: Secondary | ICD-10-CM | POA: Insufficient documentation

## 2023-10-17 DIAGNOSIS — Z9221 Personal history of antineoplastic chemotherapy: Secondary | ICD-10-CM | POA: Diagnosis not present

## 2023-10-17 DIAGNOSIS — Z853 Personal history of malignant neoplasm of breast: Secondary | ICD-10-CM | POA: Insufficient documentation

## 2023-10-17 DIAGNOSIS — Z171 Estrogen receptor negative status [ER-]: Secondary | ICD-10-CM | POA: Diagnosis not present

## 2023-10-17 DIAGNOSIS — Z79899 Other long term (current) drug therapy: Secondary | ICD-10-CM | POA: Insufficient documentation

## 2023-10-17 DIAGNOSIS — Z923 Personal history of irradiation: Secondary | ICD-10-CM | POA: Insufficient documentation

## 2023-10-17 DIAGNOSIS — R6882 Decreased libido: Secondary | ICD-10-CM | POA: Insufficient documentation

## 2023-10-17 NOTE — Telephone Encounter (Signed)
 Patient waited about 10 minutes because she thought Lauren was going to come out after she finished the next person and give therapist names and make sure that [facility] has gotten appointment for her and she said she will be working on the testosterone  and only warm drugs does it and it has to be compounded and is going to take a while she said, when I did not get the talk to her on the phone I got one of the schedulers to give me a list it is called AVS and it says at the top of it the doctors they can see , so the paper has date and time of the mammography also.  And I am sending out the paper and you should get it within a week

## 2023-10-17 NOTE — Patient Instructions (Signed)
 The therapists we discussed are: They are AASECT certified.   Madison Cork- [phone] (located in [place]) [facility]- [phone] (located in [place]) [phone] (offers telemedicine from [facility]) Katelyn Chapman- [phone] (offers telemedicine from [place])

## 2023-10-17 NOTE — Progress Notes (Signed)
 Symptom Management Clinic  [facility] at [facility] Department of the [place]. [facility] [address], [address] [place], [place] [address] [phone] (phone) [phone] (fax)  Patient Care Team: Greer, Angeline ORN, NP as PCP - General (Internal Medicine) Darron Deatrice LABOR, MD as PCP - Cardiology (Cardiology) Georgina Shasta POUR, RN as Oncology Nurse Navigator Lenn Aran, MD as Consulting Physician (Radiation Oncology) Tye Millet, DO as Consulting Physician (General Surgery) Melanee Annah BROCKS, MD as Consulting Physician (Oncology)   Name of the patient: Jennifer Garrison  [id number]  May 10, 1960   Date of visit: 10/17/23  Diagnosis- History of Breast Cancer  Chief complaint/ Reason for visit- Decreased libido  Heme/Onc history:  Oncology History  Breast cancer (HCC)  05/11/2022 Mammogram   Diagnostic mammogram and ultrasound- FINDINGS: Full field and spot compression views of the LEFT breast demonstrate a 1 cm irregular mass within the UPPER INNER LEFT breast, at the site of patient concern. The patient has retropectoral implants.   On physical exam, a firm palpable mass identified at the 11 o'clock position of the LEFT breast 8 cm from the nipple.   Targeted ultrasound is performed, showing a 0.9 x 0.8 x 1 cm irregular hypoechoic mass at the 11 o'clock position of the LEFT breast 8 cm from the nipple.   No abnormal LEFT axillary lymph nodes are noted.   IMPRESSION: 1. Suspicious 1 cm UPPER INNER LEFT breast mass. Tissue sampling is recommended. 2. No abnormal appearing LEFT axillary lymph nodes.   05/17/2022 Cancer Staging   Staging form: Breast, AJCC 8th Edition - Clinical stage from 05/17/2022: Stage IA (cT1b, cN0, cM0, G3, ER-, PR-, HER2+) - Signed by Clista Bimler, MD on 05/27/2022 Stage prefix: Initial diagnosis Histologic grading system: 3 grade system   05/17/2022 Initial Biopsy   DIAGNOSIS:  A. BREAST, LEFT, 11:00, 8 CM  FROM NIPPLE, ULTRASOUND-GUIDED BIOPSY:   - INVASIVE MAMMARY CARCINOMA, NOS DUCTAL.   Size of invasive carcinoma: 10 mm in this sample  Histologic grade of invasive carcinoma: Grade 3                       Glandular/tubular differentiation score: 3                       Nuclear pleomorphism score: 3                       Mitotic rate score: 2                       Total score: 8  Ductal carcinoma in situ: Not identified  Lymphovascular invasion: Not identified   Estrogen Receptor (ER) Status: NEGATIVE (LESS THAN 1%)   Progesterone  Receptor (PgR) Status: NEGATIVE (LESS THAN 1%)   HER2 (by immunohistochemistry): POSITIVE (Score 3+)       Percentage of cells with uniform intense complete membrane  staining: 80%  Ki-67: Not performed     Genetic Testing   No pathogenic variants identified on the [facility] Multi-Cancer+RNA panel. VUS in MUTYH called c.[id number]>A identified. The report date is 06/09/2022.  The Multi-Cancer + RNA Panel offered by [facility] includes sequencing and/or deletion/duplication analysis of the following 70 genes:  AIP*, ALK, APC*, ATM*, AXIN2*, BAP1*, BARD1*, BLM*, BMPR1A*, BRCA1*, BRCA2*, BRIP1*, CDC73*, CDH1*, CDK4, CDKN1B*, CDKN2A, CHEK2*, CTNNA1*, DICER1*, EPCAM, EGFR, FH*, FLCN*, GREM1, HOXB13, KIT, LZTR1, MAX*, MBD4, MEN1*, MET, MITF,  MLH1*, MSH2*, MSH3*, MSH6*, MUTYH*, NF1*, NF2*, NTHL1*, PALB2*, PDGFRA, PMS2*, POLD1*, POLE*, POT1*, PRKAR1A*, PTCH1*, PTEN*, RAD51C*, RAD51D*, RB1*, RET, SDHA*, SDHAF2*, SDHB*, SDHC*, SDHD*, SMAD4*, SMARCA4*, SMARCB1*, SMARCE1*, STK11*, SUFU*, TMEM127*, TP53*, TSC1*, TSC2*, VHL*. RNA analysis is performed for * genes.   06/09/2022 Surgery   S/p left breast lumpectomy with SLNB by Dr. Tye  Pathology showed 1.4 cm IDC, grade 3, DCIS present grade 2-3 with comedonecrosis, 0/3 lymph nodes negative, margins negative, pT1c.  ER/PR negative.  HER2 positive   07/18/2022 -  Chemotherapy   Patient is on Treatment Plan : BREAST Paclitaxel  +  Trastuzumab  q7d / Trastuzumab  q21d       Interval history- Patient is 63 year old female with above history of breast cancer, not on AI due to ER/PR negativity, who presents to symptom management clinic for complaints of decreased libido. She says this is a chronic problem with decreased interest in intercourse for past several years but worsened after her breast cancer diagnosis and subsequent treatments. She has completed her therapy and has not seen improvement in her symptoms. She is using topical estrogen cream with improvement in vaginal moisture. Denies difficulty reaching orgasm. Denies pain with intercourse. She is taking buspar  and wellbutrin  and has for many years. Denies body image issues. Denies having sexual thoughts or fantasies. Denies difficulty with stress, anxiety, or sleep. Currently having intercourse or sexual contact 3 times a week. Unsure of what her goal would be but she says she feels unfulfilled and would like to have more interest in sex.   Review of systems- Review of Systems  Constitutional:  Negative for malaise/fatigue.  Genitourinary:  Negative for dysuria.  Neurological:  Negative for weakness.  Psychiatric/Behavioral:  Negative for depression. The patient is not nervous/anxious and does not have insomnia.     Allergies  Allergen Reactions   Poison Ivy Extract Rash   Past Medical History:  Diagnosis Date   Allergy 1970   Anxiety 2000   Arthritis 2021   Breast cancer (HCC) 05/27/2022   Cancer (HCC) 05/11/2022   Breast stage A1   Depression 2000   IBS (irritable bowel syndrome)    Trigger finger    Left Middle Finger 11/2016   Past Surgical History:  Procedure Laterality Date   ABDOMINAL HYSTERECTOMY  02/2016   Only cervix remains (done for prolapse)   ABDOMINAL HYSTERECTOMY  2017   AUGMENTATION MAMMAPLASTY Bilateral 2010   BREAST BIOPSY Left 05/17/2022   US  BX, ribbon clip, path pending   BREAST BIOPSY Left 05/17/2022   US  LT BREAST BX W LOC DEV  1ST LESION IMG BX SPEC US  GUIDE 05/17/2022 [facility]   BREAST CYST ASPIRATION Left 2010   neg   BREAST ENHANCEMENT SURGERY  1998   CHOLECYSTECTOMY  1991   COLONOSCOPY WITH PROPOFOL  N/A 10/10/2019   Procedure: COLONOSCOPY WITH PROPOFOL ;  Surgeon: Therisa Bi, MD;  Location: [facility] END[facility]COPY;  Service: Gastroenterology;  Laterality: N/A;   PARTIAL MASTECTOMY WITH AXILLARY SENTINEL LYMPH NODE BIOPSY Left 06/09/2022   Procedure: PARTIAL MASTECTOMY WITH AXILLARY SENTINEL LYMPH NODE BIOPSY;  Surgeon: Tye Millet, DO;  Location: [facility] ORS;  Service: General;  Laterality: Left;   PORTACATH PLACEMENT N/A 06/30/2022   Procedure: INSERTION PORT-A-CATH;  Surgeon: Tye Millet, DO;  Location: [facility] ORS;  Service: General;  Laterality: N/A;   TYMPANOPLASTY Left    Social History   Socioeconomic History   Marital status: Married    Spouse name: Alm   Number of children: 1   Years  of education: Not on file   Highest education level: Associate degree: occupational, technical, or vocational program  Occupational History   Not on file  Tobacco Use   Smoking status: Former    Current packs/day: 0.00    Types: Cigarettes    Quit date: 09/09/1992    Years since quitting: 31.1   Smokeless tobacco: Current    Types: Chew   Tobacco comments:    Got pregnant  Vaping Use   Vaping status: Every Day  Substance and Sexual Activity   Alcohol use: Not Currently   Drug use: Yes    Types: Marijuana    Comment: daily   Sexual activity: Yes    Birth control/protection: Post-menopausal  Other Topics Concern   Not on file  Social History Narrative   Not on file   Social Drivers of Health   Financial Resource Strain: Low Risk  (05/12/2023)   Overall Financial Resource Strain (CARDIA)    Difficulty of Paying Living Expenses: Not hard at all  Food Insecurity: No Food Insecurity (05/12/2023)   Hunger Vital Sign    Worried About Running Out of Food in the Last Year: Never true    Ran Out of Food in the  Last Year: Never true  Transportation Needs: No Transportation Needs (05/12/2023)   PRAPARE - Administrator, Civil Service (Medical): No    Lack of Transportation (Non-Medical): No  Physical Activity: Sufficiently Active (05/12/2023)   Exercise Vital Sign    Days of Exercise per Week: 7 days    Minutes of Exercise per Session: 60 min  Stress: No Stress Concern Present (05/12/2023)   [employer] of Occupational Health - Occupational Stress Questionnaire    Feeling of Stress : Only a little  Social Connections: Moderately Integrated (05/12/2023)   Social Connection and Isolation Panel    Frequency of Communication with Friends and Family: Three times a week    Frequency of Social Gatherings with Friends and Family: Once a week    Attends Religious Services: More than 4 times per year    Active Member of [employer] or Organizations: No    Attends Engineer, structural: Not on file    Marital Status: Married  Catering manager Violence: Not At Risk (05/27/2022)   Humiliation, Afraid, Rape, and Kick questionnaire    Fear of Current or Ex-Partner: No    Emotionally Abused: No    Physically Abused: No    Sexually Abused: No   Family History  Problem Relation Age of Onset   Lung cancer Mother 89   Hypertension Mother    Cancer Mother    Lupus Father    Leukemia Cousin 6   Early death Sister    Breast cancer Neg Hx      Current Outpatient Medications:    ADVANCED FIBER COMPLEX PO, Take 3 tablets by mouth daily. Gummies, Disp: , Rfl:    alendronate (FOSAMAX) 70 MG tablet, Take 70 mg by mouth once a week., Disp: , Rfl:    buPROPion  (WELLBUTRIN  XL) 150 MG 24 hr tablet, Take 1 tablet (150 mg total) by mouth daily., Disp: 90 tablet, Rfl: 1   busPIRone  (BUSPAR ) 5 MG tablet, TAKE 1 TABLET TWICE A DAY AS NEEDED, Disp: 180 tablet, Rfl: 0   Calcium  Carb-Cholecalciferol (CALCIUM  600 + D) 600-5 MG-MCG TABS, 1 orally twice a day, Disp: , Rfl:    dicyclomine  (BENTYL ) 10 MG  capsule, Take 1 capsule (10 mg total) by mouth 3 (three)  times daily as needed for up to 10 days for spasms., Disp: 30 capsule, Rfl: 0   estradiol  (ESTRACE  VAGINAL) 0.1 MG/GM vaginal cream, Apply 1/3 an applicator full high into the vagina at night to 3 times a week., Disp: 42.5 g, Rfl: 2   HYDROcodone -acetaminophen  (NORCO) 5-325 MG tablet, Take 1 tablet by mouth every 6 (six) hours as needed for up to 6 doses for moderate pain., Disp: 6 tablet, Rfl: 0   ibuprofen  (ADVIL ) 600 MG tablet, Take 1 tablet (600 mg total) by mouth every 6 (six) hours as needed., Disp: 30 tablet, Rfl: 0   lidocaine -prilocaine  (EMLA ) cream, APPLY TO THE AFFECTED AREA AS DIRECTED, Disp: 30 g, Rfl: 11   LORazepam  (ATIVAN ) 0.5 MG tablet, Take 1 tablet (0.5 mg total) by mouth every 8 (eight) hours., Disp: 30 tablet, Rfl: 0   magic mouthwash (multi-ingredient) oral suspension, Take 5 mLs by mouth 4 (four) times daily as needed., Disp: 300 mL, Rfl: 0   metroNIDAZOLE  (METROCREAM ) 0.75 % cream, Apply topically as needed., Disp: , Rfl:    mupirocin  cream (BACTROBAN ) 2 %, Apply 1 Application topically 2 (two) times daily. Apply to skin of nails twice a day for chemotherapy nail changes., Disp: 30 g, Rfl: 0   ondansetron  (ZOFRAN ) 8 MG tablet, Take 1 tablet (8 mg total) by mouth every 8 (eight) hours as needed for nausea or vomiting., Disp: 30 tablet, Rfl: 1   OVER THE COUNTER MEDICATION, 1.5 tablets in the morning and at bedtime. Calcium  Bone Strength Supplement1, Disp: , Rfl:    OVER THE COUNTER MEDICATION, 2 tablets daily. Vitex Berry, Disp: , Rfl:    OVER THE COUNTER MEDICATION, in the morning, at noon, and at bedtime. Milk Thisle Seeds Tea, Disp: , Rfl:    oxybutynin  (DITROPAN  XL) 5 MG 24 hr tablet, Take 1 tablet (5 mg total) by mouth at bedtime., Disp: 90 tablet, Rfl: 1   prochlorperazine  (COMPAZINE ) 10 MG tablet, Take 1 tablet (10 mg total) by mouth every 6 (six) hours as needed for nausea or vomiting., Disp: 30 tablet, Rfl: 1    triamcinolone  cream (KENALOG ) 0.1 %, Apply 1 Application topically 2 (two) times daily. Apply to nail plates twice a day for nail lifting due to chemotherapy., Disp: 28 g, Rfl: 0  Physical exam:  Vitals:   10/17/23 1047  BP: 130/63  Pulse: (!) 57  Resp: 20  Temp: 98.8 F (37.1 C)  SpO2: 100%  Weight: 112 lb 9.6 oz (51.1 kg)   Physical Exam Vitals reviewed.  Constitutional:      Appearance: She is not ill-appearing.  Neurological:     Mental Status: She is alert and oriented to person, place, and time.  Psychiatric:        Mood and Affect: Mood normal.        Behavior: Behavior normal.     Assessment - Patient is a 63 y.o. female with history of ER/PR negative HER2 positive Stage IA breast cancer s/p lumpectomy, radiation, weekly taxol  & herceptin  completed therapy 07/25/23 who presents to symptom management clinic for complaints of:   Sexual Dysfunction/Sexual interest Disorder- The patient presents with chronic low libido, which has worsened following treatment for hormone receptor (HR)-negative breast cancer. She is not currently on hormone-lowering medications. Sexual dysfunction is multifactorial and commonly affects women across all age groups. Contributing factors may include relationship dynamics, psychological stressors, and comorbid conditions.  Plan:  Psychosocial Support * Recommended a multidisciplinary approach, emphasizing the importance of counseling.  *  Provided contact information for several local AASECT-certified sex therapists. * Encouraged follow-up with mental health providers to address underlying psychosocial contributors.  2. Lifestyle Interventions:      * Strongly encouraged regular exercise, which has been shown to enhance dopamine levels, reduce stress, and improve sexual function--often more effectively than medications.  3. Pharmacologic Considerations:      * Patient is currently taking Buspirone  and Bupropion , both of which may have positive  effects on libido.      * Addyi (flibanserin) is not approved for use in postmenopausal women and is therefore not a viable option.  4. Androgen therapy      * Testosterone  therapy was discussed as a potential alternative. While low testosterone  levels do not reliably predict sexual dysfunction in women, we considered titrating to the upper limit of normal (typically ~10% lower than female dosing).       * Reviewed the limited data on safety and efficacy of androgen therapy, especially in the absence of concomitant estrogen use.  5. Hormone Replacement      * Systemic estrogen therapy is contraindicated due to her history of breast cancer      * current literature suggests that HR negative breast cancer may still be influenced by hormonal pathways and assumption that HR negativ ebreast cancer is HR insensitive is incorrect. However, If she has exhausted other options and is willing to assume the risk of potentially developing a new, HR positive breast cancer, then that could be considered as last line option.   Given the impact on her quality of life, an ongoing discussion of risks, benefits, and non-hormonal alternatives is warranted. I recommend she see a gynecologist who is more familiar with management. She was previously referred in May but has not yet been able to set up an appointment.   Coital urinary incontinence- previously referred to uro-gyn. Awaiting appt.  Breast cancer surveillance- transferred care to Dr. Melanee and has appt later this month.    Visit Diagnosis No diagnosis found.  Patient expressed understanding and was in agreement with this plan. She also understands that She can call clinic at any time with any questions, concerns, or complaints.   Thank you for allowing me to participate in the care of this patient.   I discussed the assessment and treatment plan with the patient. The patient was provided an opportunity to ask questions and all were answered. The patient  agreed with the plan and demonstrated an understanding of the instructions.   The patient was advised to call back or seek an in-person evaluation if the symptoms worsen or if the condition fails to improve as anticipated.   I spent 60 minutes face-to-face visit time dedicated to the care of this patient on the date of this encounter to including face-to-face time with the patient, and post visit ordering of testing/documentation.    Tinnie Dawn, DNP, AGNP-C, AOCNP [facility] at [facility] [phone]  CC: Dr Melanee

## 2023-10-18 ENCOUNTER — Other Ambulatory Visit: Payer: Self-pay | Admitting: Medical Genetics

## 2023-10-20 ENCOUNTER — Other Ambulatory Visit
Admission: RE | Admit: 2023-10-20 | Discharge: 2023-10-20 | Disposition: A | Discharge status: Home / Self Care | Payer: Self-pay | Source: Ambulatory Visit | Attending: Medical Genetics | Admitting: Medical Genetics

## 2023-10-24 ENCOUNTER — Ambulatory Visit: Admitting: Nurse Practitioner

## 2023-10-25 ENCOUNTER — Ambulatory Visit
Admission: RE | Admit: 2023-10-25 | Discharge: 2023-10-25 | Disposition: A | Discharge status: Home / Self Care | Source: Ambulatory Visit | Attending: Internal Medicine | Admitting: Internal Medicine

## 2023-10-25 ENCOUNTER — Other Ambulatory Visit: Payer: Self-pay | Admitting: Internal Medicine

## 2023-10-25 DIAGNOSIS — Z171 Estrogen receptor negative status [ER-]: Secondary | ICD-10-CM | POA: Diagnosis present

## 2023-10-25 DIAGNOSIS — C50912 Malignant neoplasm of unspecified site of left female breast: Secondary | ICD-10-CM | POA: Diagnosis present

## 2023-10-26 NOTE — Telephone Encounter (Signed)
 Requested Prescriptions  Pending Prescriptions Disp Refills   oxybutynin  (DITROPAN -XL) 5 MG 24 hr tablet [Pharmacy Med Name: OXYBUTYNIN  CHLORIDE ER TABS 5MG ] 90 tablet 1    Sig: TAKE 1 TABLET AT BEDTIME     Urology:  Bladder Agents Passed - 10/26/2023  8:38 AM      Passed - Valid encounter within last 12 months    Recent Outpatient Visits           5 months ago Encounter for general adult medical examination with abnormal findings   [place] [facility] [place], Angeline ORN, NP       Future Appointments             In 3 weeks Baity, Angeline ORN, NP [place] [facility], [facility]   In 1 month Zuleta, Kaitlin G, NP [place] [facility] at [facility], [facility]

## 2023-10-30 ENCOUNTER — Other Ambulatory Visit: Payer: Self-pay | Admitting: Internal Medicine

## 2023-10-31 NOTE — Telephone Encounter (Signed)
 Requested Prescriptions  Pending Prescriptions Disp Refills   buPROPion  (WELLBUTRIN  XL) 150 MG 24 hr tablet [Pharmacy Med Name: BUPROPION  HCL XL TABS 150MG ] 90 tablet 3    Sig: TAKE 1 TABLET DAILY     Psychiatry: Antidepressants - bupropion  Failed - 10/31/2023 11:03 AM      Failed - Completed PHQ-2 or PHQ-9 in the last 360 days      Passed - Cr in normal range and within 360 days    Creatinine  Date Value Ref Range Status  02/13/2023 0.90 0.44 - 1.00 mg/dL Final   Creat  Date Value Ref Range Status  06/28/2021 0.88 0.50 - 1.05 mg/dL Final   Creatinine, Ser  Date Value Ref Range Status  07/25/2023 0.80 0.44 - 1.00 mg/dL Final         Passed - AST in normal range and within 360 days    AST  Date Value Ref Range Status  07/25/2023 20 15 - 41 U/L Final  02/13/2023 19 15 - 41 U/L Final         Passed - ALT in normal range and within 360 days    ALT  Date Value Ref Range Status  07/25/2023 17 0 - 44 U/L Final  02/13/2023 16 0 - 44 U/L Final         Passed - Last BP in normal range    BP Readings from Last 1 Encounters:  10/17/23 130/63         Passed - Valid encounter within last 6 months    Recent Outpatient Visits           5 months ago Encounter for general adult medical examination with abnormal findings   [place] [facility] [place], Angeline ORN, NP       Future Appointments             In 3 weeks Baity, Angeline ORN, NP [facility], [facility]   In 1 month Zuleta, Kaitlin G, NP [place] [facility] at [facility], [facility]

## 2023-11-03 LAB — GENECONNECT MOLECULAR SCREEN: Genetic Analysis Overall Interpretation: NEGATIVE

## 2023-11-06 ENCOUNTER — Other Ambulatory Visit

## 2023-11-06 ENCOUNTER — Ambulatory Visit: Admitting: Oncology

## 2023-11-07 ENCOUNTER — Other Ambulatory Visit

## 2023-11-07 ENCOUNTER — Ambulatory Visit: Admitting: Oncology

## 2023-11-10 ENCOUNTER — Encounter: Payer: Self-pay | Admitting: Nurse Practitioner

## 2023-11-10 ENCOUNTER — Encounter: Payer: Self-pay | Admitting: Internal Medicine

## 2023-11-16 ENCOUNTER — Ambulatory Visit: Payer: 59 | Admitting: Internal Medicine

## 2023-11-20 ENCOUNTER — Other Ambulatory Visit: Payer: Self-pay

## 2023-11-20 ENCOUNTER — Other Ambulatory Visit: Payer: Self-pay | Admitting: Internal Medicine

## 2023-11-20 DIAGNOSIS — C50912 Malignant neoplasm of unspecified site of left female breast: Secondary | ICD-10-CM

## 2023-11-21 ENCOUNTER — Inpatient Hospital Stay: Attending: Internal Medicine

## 2023-11-21 ENCOUNTER — Inpatient Hospital Stay (HOSPITAL_BASED_OUTPATIENT_CLINIC_OR_DEPARTMENT_OTHER): Admitting: Oncology

## 2023-11-21 ENCOUNTER — Encounter: Payer: Self-pay | Admitting: Oncology

## 2023-11-21 ENCOUNTER — Other Ambulatory Visit

## 2023-11-21 ENCOUNTER — Ambulatory Visit: Admitting: Oncology

## 2023-11-21 VITALS — BP 111/67 | HR 57 | Temp 97.8°F | Resp 20 | Wt 114.3 lb

## 2023-11-21 DIAGNOSIS — Z08 Encounter for follow-up examination after completed treatment for malignant neoplasm: Secondary | ICD-10-CM

## 2023-11-21 DIAGNOSIS — Z171 Estrogen receptor negative status [ER-]: Secondary | ICD-10-CM

## 2023-11-21 DIAGNOSIS — Z79899 Other long term (current) drug therapy: Secondary | ICD-10-CM

## 2023-11-21 DIAGNOSIS — R6882 Decreased libido: Secondary | ICD-10-CM

## 2023-11-21 DIAGNOSIS — Z9012 Acquired absence of left breast and nipple: Secondary | ICD-10-CM

## 2023-11-21 DIAGNOSIS — C50912 Malignant neoplasm of unspecified site of left female breast: Secondary | ICD-10-CM

## 2023-11-21 DIAGNOSIS — Z853 Personal history of malignant neoplasm of breast: Secondary | ICD-10-CM | POA: Insufficient documentation

## 2023-11-21 LAB — CBC WITH DIFFERENTIAL (CANCER CENTER ONLY)
Abs Immature Granulocytes: 0.04 K/uL (ref 0.00–0.07)
Basophils Absolute: 0.1 K/uL (ref 0.0–0.1)
Basophils Relative: 1 %
Eosinophils Absolute: 0.3 K/uL (ref 0.0–0.5)
Eosinophils Relative: 4 %
HCT: 43.5 % (ref 36.0–46.0)
Hemoglobin: 14.1 g/dL (ref 12.0–15.0)
Immature Granulocytes: 1 %
Lymphocytes Relative: 15 %
Lymphs Abs: 1.2 K/uL (ref 0.7–4.0)
MCH: 28.9 pg (ref 26.0–34.0)
MCHC: 32.4 g/dL (ref 30.0–36.0)
MCV: 89.1 fL (ref 80.0–100.0)
Monocytes Absolute: 0.4 K/uL (ref 0.1–1.0)
Monocytes Relative: 5 %
Neutro Abs: 5.9 K/uL (ref 1.7–7.7)
Neutrophils Relative %: 74 %
Platelet Count: 223 K/uL (ref 150–400)
RBC: 4.88 MIL/uL (ref 3.87–5.11)
RDW: 13 % (ref 11.5–15.5)
WBC Count: 7.9 K/uL (ref 4.0–10.5)
nRBC: 0 % (ref 0.0–0.2)

## 2023-11-21 LAB — CMP (CANCER CENTER ONLY)
ALT: 20 U/L (ref 0–44)
AST: 23 U/L (ref 15–41)
Albumin: 4.2 g/dL (ref 3.5–5.0)
Alkaline Phosphatase: 45 U/L (ref 38–126)
Anion gap: 9 (ref 5–15)
BUN: 15 mg/dL (ref 8–23)
CO2: 25 mmol/L (ref 22–32)
Calcium: 9.1 mg/dL (ref 8.9–10.3)
Chloride: 103 mmol/L (ref 98–111)
Creatinine: 0.74 mg/dL (ref 0.44–1.00)
GFR, Estimated: >60 mL/min (ref >60)
Glucose, Bld: 92 mg/dL (ref 70–99)
Potassium: 3.8 mmol/L (ref 3.5–5.1)
Sodium: 137 mmol/L (ref 135–145)
Total Bilirubin: 1.2 mg/dL (ref 0.0–1.2)
Total Protein: 7.2 g/dL (ref 6.5–8.1)

## 2023-11-21 NOTE — Telephone Encounter (Signed)
 Requested Prescriptions  Pending Prescriptions Disp Refills   busPIRone  (BUSPAR ) 5 MG tablet [Pharmacy Med Name: BUSPIRONE  HCL TABS 5MG ] 180 tablet 1    Sig: TAKE 1 TABLET TWICE A DAY AS NEEDED     Psychiatry: Anxiolytics/Hypnotics - Non-controlled Passed - 11/21/2023 12:24 PM      Passed - Valid encounter within last 12 months    Recent Outpatient Visits           6 months ago Encounter for general adult medical examination with abnormal findings   [facility] [place], Angeline ORN, NP       Future Appointments             Tomorrow Antonette, Angeline ORN, NP [place] [facility], [facility]   In 1 week Zuleta, Kaitlin G, NP [place] [facility]cology at [facility], [facility]

## 2023-11-21 NOTE — Progress Notes (Signed)
 Hematology/Oncology Consult note [facility]  [phone] [phone]  Patient Care Team: Antonette Angeline ORN, NP as PCP - General (Internal Medicine) Darron Deatrice LABOR, MD as PCP - Cardiology (Cardiology) Georgina Shasta POUR, RN as Oncology Nurse Navigator Lenn Aran, MD as Consulting Physician (Radiation Oncology) Tye Millet, DO as Consulting Physician (General Surgery) Melanee Annah BROCKS, MD as Consulting Physician (Oncology)   Name of the patient: Jennifer Garrison  [id number]  09/08/1960   Date of visit: 11/21/23  Diagnosis-  Cancer Staging  Breast cancer [facility]) Staging form: Breast, AJCC 8th Edition - Clinical stage from 05/17/2022: Stage IA (cT1b, cN0, cM0, G3, ER-, PR-, HER2+) - Signed by Clista Bimler, MD on 05/27/2022 Stage prefix: Initial diagnosis Histologic grading system: 3 grade system    Chief complaint/ Reason for visit-routine follow-up of breast cancer  Heme/Onc history: Patient is a 63 year old female with history of stage Ia invasive mammary carcinoma of the left breast ER/PR negative HER2 positive.  She underwent lumpectomy on 06/09/2022 which showed 1.4 cm grade 3 tumor with negative margins.  0 out of 3 lymph nodes positive.  pT1c N0.  Patient completed weekly Taxol  Herceptin  in July 2024 followed by adjuvant Herceptin  followed by 1 year of adjuvant Herceptin .  There was some concern for trastuzumab  induced cardiotoxicity when her EF dropped from 55 to 60% to 45 to 50%.  She was seen by cardiology Dr. In the past.  Repeat echo showed improved EF.  Genetic testing negative.  Interval history-patient is presently doing well but her biggest concern has been her sexual health and she reports vaginal dryness.  This has not improved despite trying topical estrogen cream.  ECOG PS- 0 Pain scale- 0   Review of systems- Review of Systems  Constitutional:  Negative for chills, fever, malaise/fatigue and weight loss.  HENT:  Negative  for congestion, ear discharge and nosebleeds.   Eyes:  Negative for blurred vision.  Respiratory:  Negative for cough, hemoptysis, sputum production, shortness of breath and wheezing.   Cardiovascular:  Negative for chest pain, palpitations, orthopnea and claudication.  Gastrointestinal:  Negative for abdominal pain, blood in stool, constipation, diarrhea, heartburn, melena, nausea and vomiting.  Genitourinary:  Negative for dysuria, flank pain, frequency, hematuria and urgency.       Vaginal dryness  Musculoskeletal:  Negative for back pain, joint pain and myalgias.  Skin:  Negative for rash.  Neurological:  Negative for dizziness, tingling, focal weakness, seizures, weakness and headaches.  Endo/Heme/Allergies:  Does not bruise/bleed easily.  Psychiatric/Behavioral:  Negative for depression and suicidal ideas. The patient does not have insomnia.       Allergies  Allergen Reactions   Poison Ivy Extract Rash     Past Medical History:  Diagnosis Date   Allergy 1970   Anxiety 2000   Arthritis 2021   Breast cancer (HCC) 05/27/2022   Cancer (HCC) 05/11/2022   Breast stage A1   Depression 2000   IBS (irritable bowel syndrome)    Trigger finger    Left Middle Finger 11/2016     Past Surgical History:  Procedure Laterality Date   ABDOMINAL HYSTERECTOMY  02/2016   Only cervix remains (done for prolapse)   ABDOMINAL HYSTERECTOMY  2017   AUGMENTATION MAMMAPLASTY Bilateral 2010   BREAST BIOPSY Left 05/17/2022   US  BX, ribbon clip, path pending   BREAST BIOPSY Left 05/17/2022   US  LT BREAST BX W LOC DEV 1ST LESION IMG BX SPEC US  GUIDE 05/17/2022  [facility]   BREAST CYST ASPIRATION Left 2010   neg   BREAST ENHANCEMENT SURGERY  1998   CHOLECYSTECTOMY  1991   COLONOSCOPY WITH PROPOFOL  N/A 10/10/2019   Procedure: COLONOSCOPY WITH PROPOFOL ;  Surgeon: Therisa Bi, MD;  Location: [facility] ENDOSCOPY;  Service: Gastroenterology;  Laterality: N/A;   PARTIAL MASTECTOMY WITH AXILLARY  SENTINEL LYMPH NODE BIOPSY Left 06/09/2022   Procedure: PARTIAL MASTECTOMY WITH AXILLARY SENTINEL LYMPH NODE BIOPSY;  Surgeon: Tye Millet, DO;  Location: [facility] ORS;  Service: General;  Laterality: Left;   PORTACATH PLACEMENT N/A 06/30/2022   Procedure: INSERTION PORT-A-CATH;  Surgeon: Tye Millet, DO;  Location: [facility] ORS;  Service: General;  Laterality: N/A;   TYMPANOPLASTY Left     Social History   Socioeconomic History   Marital status: Married    Spouse name: Alm   Number of children: 1   Years of education: Not on file   Highest education level: Associate degree: occupational, Scientist, product/process development, or vocational program  Occupational History   Not on file  Tobacco Use   Smoking status: Former    Current packs/day: 0.00    Types: Cigarettes    Quit date: 09/09/1992    Years since quitting: 31.2   Smokeless tobacco: Current    Types: Chew   Tobacco comments:    Got pregnant  Vaping Use   Vaping status: Every Day  Substance and Sexual Activity   Alcohol use: Not Currently   Drug use: Yes    Types: Marijuana    Comment: daily   Sexual activity: Yes    Birth control/protection: Post-menopausal  Other Topics Concern   Not on file  Social History Narrative   Not on file   Social Drivers of Health   Financial Resource Strain: Low Risk  (11/19/2023)   Overall Financial Resource Strain (CARDIA)    Difficulty of Paying Living Expenses: Not hard at all  Food Insecurity: No Food Insecurity (11/19/2023)   Hunger Vital Sign    Worried About Running Out of Food in the Last Year: Never true    Ran Out of Food in the Last Year: Never true  Transportation Needs: No Transportation Needs (11/19/2023)   PRAPARE - Administrator, Civil Service (Medical): No    Lack of Transportation (Non-Medical): No  Physical Activity: Sufficiently Active (11/19/2023)   Exercise Vital Sign    Days of Exercise per Week: 7 days    Minutes of Exercise per Session: 60 min  Stress: Stress Concern  Present (11/19/2023)   [employer] of Occupational Health - Occupational Stress Questionnaire    Feeling of Stress: To some extent  Social Connections: Moderately Integrated (11/19/2023)   Social Connection and Isolation Panel    Frequency of Communication with Friends and Family: Three times a week    Frequency of Social Gatherings with Friends and Family: Once a week    Attends Religious Services: More than 4 times per year    Active Member of [employer] or Organizations: No    Attends Banker Meetings: Not on file    Marital Status: Married  Catering manager Violence: Not At Risk (05/27/2022)   Humiliation, Afraid, Rape, and Kick questionnaire    Fear of Current or Ex-Partner: No    Emotionally Abused: No    Physically Abused: No    Sexually Abused: No    Family History  Problem Relation Age of Onset   Lung cancer Mother 72   Hypertension Mother    Cancer Mother  Lupus Father    Leukemia Cousin 6   Early death Sister    Breast cancer Neg Hx      Current Outpatient Medications:    ADVANCED FIBER COMPLEX PO, Take 3 tablets by mouth daily. Gummies, Disp: , Rfl:    alendronate (FOSAMAX) 70 MG tablet, Take 70 mg by mouth once a week., Disp: , Rfl:    buPROPion  (WELLBUTRIN  XL) 150 MG 24 hr tablet, TAKE 1 TABLET DAILY, Disp: 90 tablet, Rfl: 0   Calcium  Carb-Cholecalciferol (CALCIUM  600 + D) 600-5 MG-MCG TABS, 1 orally twice a day, Disp: , Rfl:    estradiol  (ESTRACE  VAGINAL) 0.1 MG/GM vaginal cream, Apply 1/3 an applicator full high into the vagina at night to 3 times a week., Disp: 42.5 g, Rfl: 2   HYDROcodone -acetaminophen  (NORCO) 5-325 MG tablet, Take 1 tablet by mouth every 6 (six) hours as needed for up to 6 doses for moderate pain., Disp: 6 tablet, Rfl: 0   ibuprofen  (ADVIL ) 600 MG tablet, Take 1 tablet (600 mg total) by mouth every 6 (six) hours as needed., Disp: 30 tablet, Rfl: 0   metroNIDAZOLE  (METROCREAM ) 0.75 % cream, Apply topically as needed., Disp: , Rfl:     OVER THE COUNTER MEDICATION, 1.5 tablets in the morning and at bedtime. Calcium  Bone Strength Supplement1, Disp: , Rfl:    OVER THE COUNTER MEDICATION, 2 tablets daily. Vitex Berry, Disp: , Rfl:    OVER THE COUNTER MEDICATION, in the morning, at noon, and at bedtime. Milk Thisle Seeds Tea, Disp: , Rfl:    oxybutynin  (DITROPAN -XL) 5 MG 24 hr tablet, TAKE 1 TABLET AT BEDTIME, Disp: 90 tablet, Rfl: 1   triamcinolone  cream (KENALOG ) 0.1 %, Apply 1 Application topically 2 (two) times daily. Apply to nail plates twice a day for nail lifting due to chemotherapy., Disp: 28 g, Rfl: 0   busPIRone  (BUSPAR ) 5 MG tablet, TAKE 1 TABLET TWICE A DAY AS NEEDED, Disp: 180 tablet, Rfl: 1   magic mouthwash (multi-ingredient) oral suspension, Take 5 mLs by mouth 4 (four) times daily as needed., Disp: 300 mL, Rfl: 0   ondansetron  (ZOFRAN ) 8 MG tablet, Take 1 tablet (8 mg total) by mouth every 8 (eight) hours as needed for nausea or vomiting., Disp: 30 tablet, Rfl: 1  Physical exam:  Vitals:   11/21/23 1031  BP: 111/67  Pulse: (!) 57  Resp: 20  Temp: 97.8 F (36.6 C)  SpO2: 100%  Weight: 114 lb 4.8 oz (51.8 kg)   Physical Exam Cardiovascular:     Rate and Rhythm: Normal rate and regular rhythm.     Heart sounds: Normal heart sounds.  Pulmonary:     Effort: Pulmonary effort is normal.     Breath sounds: Normal breath sounds.  Abdominal:     General: Bowel sounds are normal.     Palpations: Abdomen is soft.  Skin:    General: Skin is warm and dry.  Neurological:     Mental Status: She is alert and oriented to person, place, and time.    Breast exam was performed in seated and lying down position. Patient is status post left lumpectomy with a well-healed surgical scar. No evidence of any palpable masses. No evidence of axillary adenopathy. No evidence of any palpable masses or lumps in the right breast. No evidence of right axillary adenopathy.  Bilateral breast implants in place   I have personally  reviewed labs listed below:    Latest Ref Rng & Units 11/21/2023  10:01 AM  CMP  Glucose 70 - 99 mg/dL 92   BUN 8 - 23 mg/dL 15   Creatinine 9.55 - 1.00 mg/dL 9.25   Sodium 864 - 854 mmol/L 137   Potassium 3.5 - 5.1 mmol/L 3.8   Chloride 98 - 111 mmol/L 103   CO2 22 - 32 mmol/L 25   Calcium  8.9 - 10.3 mg/dL 9.1   Total Protein 6.5 - 8.1 g/dL 7.2   Total Bilirubin 0.0 - 1.2 mg/dL 1.2   Alkaline Phos 38 - 126 U/L 45   AST 15 - 41 U/L 23   ALT 0 - 44 U/L 20       Latest Ref Rng & Units 11/21/2023   10:01 AM  CBC  WBC 4.0 - 10.5 K/uL 7.9   Hemoglobin 12.0 - 15.0 g/dL 85.8   Hematocrit 63.9 - 46.0 % 43.5   Platelets 150 - 400 K/uL 223    I have personally reviewed Radiology images listed below: No images are attached to the encounter.  MM 3D DIAGNOSTIC MAMMOGRAM BILATERAL BREAST W/IMPLANT Result Date: 10/25/2023 CLINICAL DATA:  Annual examination status post LEFT breast lumpectomy in March 2024 for invasive ductal carcinoma, with negative margins. Patient received adjuvant radiation and chemotherapy. EXAM: DIGITAL DIAGNOSTIC BILATERAL MAMMOGRAM WITH IMPLANTS, TOMOSYNTHESIS AND CAD TECHNIQUE: Bilateral digital diagnostic mammography and breast tomosynthesis was performed. Standard and/or implant displaced views were performed. The images were evaluated with computer-aided detection. COMPARISON:  Previous exam(s). ACR Breast Density Category b: There are scattered areas of fibroglandular density. FINDINGS: The patient has retropectoral saline implants. Stable postlumpectomy changes in the upper-outer left breast. Spot magnification view of the lumpectomy bed does not demonstrate any new density or suspicious calcifications. No new suspicious mass, calcification, or other findings are identified in bilateral breasts. IMPRESSION: 1. No mammographic evidence of malignancy in BILATERAL breasts. 2. Stable postlumpectomy changes in the LEFT breast. RECOMMENDATION: BILATERAL diagnostic mammogram in  12 months to continue with post lumpectomy surveillance protocol. I have discussed the findings and recommendations with the patient. If applicable, a reminder letter will be sent to the patient regarding the next appointment. BI-RADS CATEGORY  2: Benign. Electronically Signed   By: Dirk Arrant M.D.   On: 10/25/2023 10:59     Assessment and plan- Patient is a 63 y.o. female with history of stage Ia invasive mammary carcinoma of the left breast pT1 cN0 M0 ER/PR negative HER2 positive s/p adjuvant chemotherapy and 1 year of Herceptin  as well as adjuvant radiation therapy.  She is here for routine follow-up visit  Clinically patient is doing well with no concerning signs and symptoms of recurrence based on today's exam.  Mammogram from July 2025 unremarkable.  She had ER-negative disease and therefore she is not on endocrine therapy.  Vaginal dryness: Could be a result of menopause and the possible effect of chemotherapy although she only received weekly Taxol  for 12 weeks.With regards to testosterone  use the safety profile regarding its use and breast cancer remains uncertain.  Epidemiological data indicate higher endogenous testosterone  levels are associated with increased risk of developing breast cancer particularly ER positive breast cancer in postmenopausal woman.  Exogenous testosterone  when combined with hormone replacement therapy was also associated with increased breast cancer risk as compared to estrogen alone.  Patient has a telehealth visit coming up and she will be discussing sexual health and medical options with another medical provider.  She will let me know as to what their recommendations are and I will see  if it would interfere with her history of breast cancer.  I will see her back in 6 months no labs.  She will continue to get yearly mammograms.  Routine lab tests recommended for breast cancer surveillance Visit Diagnosis 1. Encounter for follow-up surveillance of breast cancer       Dr. Annah Skene, MD, MPH [facility] [phone] 11/21/2023 1:22 PM               Surveillance

## 2023-11-21 NOTE — Progress Notes (Signed)
 Survivorship Care Plan visit completed.  Treatment summary reviewed and given to patient.  ASCO answers booklet reviewed and given to patient.  CARE program and Cancer Transitions discussed with patient along with other resources cancer center offers to patients and caregivers.  Patient verbalized understanding.

## 2023-11-22 ENCOUNTER — Other Ambulatory Visit: Payer: Self-pay

## 2023-11-22 ENCOUNTER — Encounter: Payer: Self-pay | Admitting: *Deleted

## 2023-11-22 ENCOUNTER — Encounter: Payer: Self-pay | Admitting: Oncology

## 2023-11-22 ENCOUNTER — Ambulatory Visit: Admitting: Internal Medicine

## 2023-11-22 NOTE — Progress Notes (Deleted)
 Subjective:    Patient ID: Jennifer Garrison, female    DOB: 05-Oct-1960, 63 y.o.   MRN: [id number]  HPI  Patient presents to clinic today for follow-up of chronic conditions.  Anxiety and depression: Chronic, managed on bupropion  and buspirone  she is not currently seeing a therapist.  She denies SI/HI.  IBS: She reports alternating constipation and diarrhea.  She tries to manage this with diet.  Colonoscopy from 09/2019 reviewed.  Osteoporosis: She is taking alendronate, calcium  and vitamin D  as prescribed.  She tries to get weightbearing exercise and daily.  Bone density from 10/2021 reviewed.  Coital urinary incontinence: She reports mainly.  She is taking oxybutynin  as prescribed.  She does not follow with urology.  Breast cancer:  Review of Systems     Past Medical History:  Diagnosis Date   Allergy 1970   Anxiety 2000   Arthritis 2021   Breast cancer (HCC) 05/27/2022   Cancer (HCC) 05/11/2022   Breast stage A1   Depression 2000   IBS (irritable bowel syndrome)    Trigger finger    Left Middle Finger 11/2016    Current Outpatient Medications  Medication Sig Dispense Refill   ADVANCED FIBER COMPLEX PO Take 3 tablets by mouth daily. Gummies     alendronate (FOSAMAX) 70 MG tablet Take 70 mg by mouth once a week.     buPROPion  (WELLBUTRIN  XL) 150 MG 24 hr tablet TAKE 1 TABLET DAILY 90 tablet 0   busPIRone  (BUSPAR ) 5 MG tablet TAKE 1 TABLET TWICE A DAY AS NEEDED 180 tablet 1   Calcium  Carb-Cholecalciferol (CALCIUM  600 + D) 600-5 MG-MCG TABS 1 orally twice a day     estradiol  (ESTRACE  VAGINAL) 0.1 MG/GM vaginal cream Apply 1/3 an applicator full high into the vagina at night to 3 times a week. 42.5 g 2   HYDROcodone -acetaminophen  (NORCO) 5-325 MG tablet Take 1 tablet by mouth every 6 (six) hours as needed for up to 6 doses for moderate pain. 6 tablet 0   ibuprofen  (ADVIL ) 600 MG tablet Take 1 tablet (600 mg total) by mouth every 6 (six) hours as needed. 30 tablet 0   magic  mouthwash (multi-ingredient) oral suspension Take 5 mLs by mouth 4 (four) times daily as needed. 300 mL 0   metroNIDAZOLE  (METROCREAM ) 0.75 % cream Apply topically as needed.     ondansetron  (ZOFRAN ) 8 MG tablet Take 1 tablet (8 mg total) by mouth every 8 (eight) hours as needed for nausea or vomiting. 30 tablet 1   OVER THE COUNTER MEDICATION 1.5 tablets in the morning and at bedtime. Calcium  Bone Strength Supplement1     OVER THE COUNTER MEDICATION 2 tablets daily. Vitex Berry     OVER THE COUNTER MEDICATION in the morning, at noon, and at bedtime. Milk Thisle Seeds Tea     oxybutynin  (DITROPAN -XL) 5 MG 24 hr tablet TAKE 1 TABLET AT BEDTIME 90 tablet 1   triamcinolone  cream (KENALOG ) 0.1 % Apply 1 Application topically 2 (two) times daily. Apply to nail plates twice a day for nail lifting due to chemotherapy. 28 g 0   No current facility-administered medications for this visit.    Allergies  Allergen Reactions   Poison Ivy Extract Rash    Family History  Problem Relation Age of Onset   Lung cancer Mother 14   Hypertension Mother    Cancer Mother    Lupus Father    Leukemia Cousin 6   Early death Sister    Breast  cancer Neg Hx     Social History   Socioeconomic History   Marital status: Married    Spouse name: Alm   Number of children: 1   Years of education: Not on file   Highest education level: Associate degree: occupational, Scientist, product/process development, or vocational program  Occupational History   Not on file  Tobacco Use   Smoking status: Former    Current packs/day: 0.00    Types: Cigarettes    Quit date: 09/09/1992    Years since quitting: 31.2   Smokeless tobacco: Current    Types: Chew   Tobacco comments:    Got pregnant  Vaping Use   Vaping status: Every Day  Substance and Sexual Activity   Alcohol use: Not Currently   Drug use: Yes    Types: Marijuana    Comment: daily   Sexual activity: Yes    Birth control/protection: Post-menopausal  Other Topics Concern   Not  on file  Social History Narrative   Not on file   Social Drivers of Health   Financial Resource Strain: Low Risk  (11/19/2023)   Overall Financial Resource Strain (CARDIA)    Difficulty of Paying Living Expenses: Not hard at all  Food Insecurity: No Food Insecurity (11/19/2023)   Hunger Vital Sign    Worried About Running Out of Food in the Last Year: Never true    Ran Out of Food in the Last Year: Never true  Transportation Needs: No Transportation Needs (11/19/2023)   PRAPARE - Administrator, Civil Service (Medical): No    Lack of Transportation (Non-Medical): No  Physical Activity: Sufficiently Active (11/19/2023)   Exercise Vital Sign    Days of Exercise per Week: 7 days    Minutes of Exercise per Session: 60 min  Stress: Stress Concern Present (11/19/2023)   [employer] of Occupational Health - Occupational Stress Questionnaire    Feeling of Stress: To some extent  Social Connections: Moderately Integrated (11/19/2023)   Social Connection and Isolation Panel    Frequency of Communication with Friends and Family: Three times a week    Frequency of Social Gatherings with Friends and Family: Once a week    Attends Religious Services: More than 4 times per year    Active Member of [employer] or Organizations: No    Attends Engineer, structural: Not on file    Marital Status: Married  Catering manager Violence: Not At Risk (05/27/2022)   Humiliation, Afraid, Rape, and Kick questionnaire    Fear of Current or Ex-Partner: No    Emotionally Abused: No    Physically Abused: No    Sexually Abused: No     Constitutional: Denies fever, malaise, fatigue, headache or abrupt weight changes.  Respiratory: Denies difficulty breathing, shortness of breath, cough or sputum production.   Cardiovascular: Denies chest pain, chest tightness, palpitations or swelling in the hands or feet.  Gastrointestinal: Patient reports alternating constipation and diarrhea.  Denies  abdominal pain, bloating, or blood in the stool.  GU: Denies urgency, frequency, pain with urination, burning sensation, blood in urine, odor or discharge. Musculoskeletal: Denies decrease in range of motion, difficulty with gait, muscle pain or joint pain and swelling.  Skin:   Denies redness, rashes, lesions or ulcercations.  Neurological: Denies dizziness, difficulty with memory, difficulty with speech or problems with balance and coordination.  Psych: Patient has a history of anxiety and depression.  Denies SI/HI.  No other specific complaints in a complete review of systems (  except as listed in HPI above).  Objective:   Physical Exam  There were no vitals taken for this visit.  Wt Readings from Last 3 Encounters:  11/21/23 114 lb 4.8 oz (51.8 kg)  10/17/23 112 lb 9.6 oz (51.1 kg)  08/25/23 116 lb 12.8 oz (53 kg)    General: Appears her stated age, well developed, well nourished in NAD. Skin: Warm, dry and intact.  Grouped vesicular rash on erythematous base noted to the left cheek. HEENT: Head: normal shape and size; Eyes: sclera white, no icterus, conjunctiva pink, PERRLA and EOMs intact;  Cardiovascular: Normal rate and rhythm. S1,S2 noted.  No murmur, rubs or gallops noted. Pulmonary/Chest: Normal effort and positive vesicular breath sounds. No respiratory distress. No wheezes, rales or ronchi noted.  Abdomen: Normal bowel sounds. Musculoskeletal: . No difficulty with gait.  Neurological: Alert and oriented.  Psychiatric: Mood and affect normal.  Mildly anxious appearing. Judgment and thought content normal.    BMET    Component Value Date/Time   NA 137 11/21/2023 1001   NA 138 09/17/2019 1136   K 3.8 11/21/2023 1001   CL 103 11/21/2023 1001   CO2 25 11/21/2023 1001   GLUCOSE 92 11/21/2023 1001   BUN 15 11/21/2023 1001   BUN 16 09/17/2019 1136   CREATININE 0.74 11/21/2023 1001   CREATININE 0.88 06/28/2021 0918   CALCIUM  9.1 11/21/2023 1001   GFRNONAA >60  11/21/2023 1001   GFRAA 81 09/17/2019 1136    Lipid Panel     Component Value Date/Time   CHOL 154 05/19/2023 0847   CHOL 165 09/17/2019 1136   TRIG 92 05/19/2023 0847   HDL 52 05/19/2023 0847   HDL 51 09/17/2019 1136   CHOLHDL 3.0 05/19/2023 0847   LDLCALC 83 05/19/2023 0847    CBC    Component Value Date/Time   WBC 7.9 11/21/2023 1001   WBC 7.5 07/25/2023 0916   RBC 4.88 11/21/2023 1001   HGB 14.1 11/21/2023 1001   HGB 14.9 09/17/2019 1136   HCT 43.5 11/21/2023 1001   HCT 43.8 09/17/2019 1136   PLT 223 11/21/2023 1001   PLT 250 09/17/2019 1136   MCV 89.1 11/21/2023 1001   MCV 87 09/17/2019 1136   MCH 28.9 11/21/2023 1001   MCHC 32.4 11/21/2023 1001   RDW 13.0 11/21/2023 1001   RDW 11.9 09/17/2019 1136   LYMPHSABS 1.2 11/21/2023 1001   LYMPHSABS 2.1 09/17/2019 1136   MONOABS 0.4 11/21/2023 1001   EOSABS 0.3 11/21/2023 1001   EOSABS 0.2 09/17/2019 1136   BASOSABS 0.1 11/21/2023 1001   BASOSABS 0.1 09/17/2019 1136    Hgb A1C Lab Results  Component Value Date   HGBA1C 5.5 05/19/2023           Assessment & Plan:      RTC in 6 months for your annual exam Angeline Laura, NP

## 2023-11-24 ENCOUNTER — Ambulatory Visit: Admitting: Internal Medicine

## 2023-11-30 ENCOUNTER — Ambulatory Visit: Admitting: Obstetrics and Gynecology

## 2023-11-30 ENCOUNTER — Encounter: Payer: Self-pay | Admitting: Obstetrics and Gynecology

## 2023-11-30 VITALS — BP 119/76 | HR 58 | Wt 114.0 lb

## 2023-11-30 DIAGNOSIS — N3281 Overactive bladder: Secondary | ICD-10-CM | POA: Diagnosis not present

## 2023-11-30 DIAGNOSIS — N39491 Coital incontinence: Secondary | ICD-10-CM | POA: Diagnosis not present

## 2023-11-30 DIAGNOSIS — N393 Stress incontinence (female) (male): Secondary | ICD-10-CM

## 2023-11-30 DIAGNOSIS — N952 Postmenopausal atrophic vaginitis: Secondary | ICD-10-CM

## 2023-11-30 DIAGNOSIS — M6289 Other specified disorders of muscle: Secondary | ICD-10-CM | POA: Diagnosis not present

## 2023-11-30 LAB — POCT URINALYSIS DIP (CLINITEK)
Bilirubin, UA: NEGATIVE
Blood, UA: NEGATIVE
Glucose, UA: NEGATIVE mg/dL
Ketones, POC UA: NEGATIVE mg/dL
Leukocytes, UA: NEGATIVE
Nitrite, UA: NEGATIVE
POC PROTEIN,UA: NEGATIVE
Spec Grav, UA: 1.01 (ref 1.010–1.025)
Urobilinogen, UA: 0.2 U/dL
pH, UA: 5.5 (ref 5.0–8.0)

## 2023-11-30 MED ORDER — VIBEGRON 75 MG PO TABS: 75.0000 mg | ORAL_TABLET | Freq: Every day | ORAL | 5 refills | Status: AC

## 2023-11-30 NOTE — Patient Instructions (Addendum)
 Today we talked about ways to manage bladder urgency such as altering your diet to avoid irritative beverages and foods (bladder diet) as well as attempting to decrease stress and other exacerbating factors.  You can also chew a plain Tums 1-3 times per day to make your urine less acidic, especially if you have eating/drinking acidic things.   The Most Bothersome Foods* The Least Bothersome Foods*  Coffee - Regular & Decaf Tea - caffeinated Carbonated beverages - cola, non-colas, diet & caffeine-free Alcohols - Beer, Red Wine, White Wine, Champagne Fruits - Grapefruit, [place], Orange, [employer] - Cranberry, Grapefruit, Orange, Pineapple Vegetables - Tomato & Tomato Products Flavor Enhancers - Hot peppers, Spicy foods, Chili, Horseradish, Vinegar, Monosodium glutamate (MSG) Artificial Sweeteners - NutraSweet, Sweet 'N Low, Equal (sweetener), Saccharin Ethnic foods - [place], [place], [place] food Water  Milk - low-fat & whole Fruits - Bananas, Blueberries, Honeydew melon, Pears, Raisins, Watermelon Vegetables - Broccoli, [address] Sprouts, [place], [place], Cauliflower, [place], Cucumber, Mushrooms, Peas, Radishes, Squash, Zucchini, White potatoes, Sweet potatoes & yams Poultry - Chicken, Eggs, [place], [employer] - Beef, Diplomatic Services operational officer, Lamb Seafood - Shrimp, [place] fish, Salmon Grains - Oat, Rice Snacks - Pretzels, Popcorn  *Mitch ALF et al. Diet and its role in interstitial cystitis/bladder pain syndrome (IC/BPS) and comorbid conditions. [employer]. BJU Int. 2012 Jan 11.    You can use vitamin E cream base lubrications, coconut oil, or aloe vera  You can use Uberlube for sex if you want to try this.   Stop Oxybutynin  and start Gemtesa  75mg  daily.   You did leak on exam with pressure, we can consider the urethral bulking if the leakage is still occurring after changing medication  We will get your records from [place]  I have sent a referral for pelvic floor Pt.

## 2023-11-30 NOTE — Progress Notes (Signed)
 [facility] New Patient Evaluation and Consultation  Referring Provider: Dasie Tinnie MATSU, NP PCP: Antonette Angeline ORN, NP Date of Service: 11/30/2023  SUBJECTIVE Chief Complaint: new patient - incontinence during intercourse  (Just went through breast cancer treatment within the past yr and it started around then. )  History of Present Illness: Jennifer Garrison is a 63 y.o. White or Caucasian female seen in consultation at the request of  Dasie, NP for evaluation of coital urination.    Review of records significant for: Hx of pelvic floor surgery, pending records request.   Urinary Symptoms: Leaks urine with during sex Leaks few time(s) per weeks.  Pad use: 0 liners/ mini-pads per day.   Patient is bothered by UI symptoms.  Day time voids 8-20.  Nocturia: 0 times per night to void. Voiding dysfunction:  does not empty bladder well.  Patient does not use a catheter to empty bladder.  When urinating, patient feels a weak stream and difficulty starting urine stream Drinks: Drinks water  32-48oz water , unsweetened tea, juicing  per day  UTIs: 0 UTI's in the last year.   Denies history of blood in urine, kidney or bladder stones, pyelonephritis, bladder cancer, and kidney cancer No results found for the last 90 days.   Pelvic Organ Prolapse Symptoms:                  Patient Denies a feeling of a bulge the vaginal area.   Bowel Symptom: Bowel movements: 2-4 time(s) per week Stool consistency: hard Straining: yes.  Splinting: no.  Incomplete evacuation: yes.  Patient Denies accidental bowel leakage / fecal incontinence Bowel regimen: none Last colonoscopy: Date 2021, Results Diverticulosis  HM Colonoscopy          Upcoming     Colonoscopy (Every 10 Years) Next due on 10/09/2029    10/10/2019  COLONOSCOPY   Only the first 1 history entries have been loaded, but more history exists.                Sexual Function Sexually active: yes.  Sexual orientation:  Straight Pain with sex: Yes, has discomfort due to dryness  Pelvic Pain Denies pelvic pain    Past Medical History:  Past Medical History:  Diagnosis Date   Allergy 1970   Anxiety 2000   Arthritis 2021   Breast cancer (HCC) 05/27/2022   Cancer (HCC) 05/11/2022   Breast stage A1   Depression 2000   IBS (irritable bowel syndrome)    Trigger finger    Left Middle Finger 11/2016     Past Surgical History:   Past Surgical History:  Procedure Laterality Date   ABDOMINAL HYSTERECTOMY  02/2016   Only cervix remains (done for prolapse)   ABDOMINAL HYSTERECTOMY  2017   AUGMENTATION MAMMAPLASTY Bilateral 2010   BREAST BIOPSY Left 05/17/2022   US  BX, ribbon clip, path pending   BREAST BIOPSY Left 05/17/2022   US  LT BREAST BX W LOC DEV 1ST LESION IMG BX SPEC US  GUIDE 05/17/2022 [facility]   BREAST CYST ASPIRATION Left 2010   neg   BREAST ENHANCEMENT SURGERY  1998   CHOLECYSTECTOMY  1991   COLONOSCOPY WITH PROPOFOL  N/A 10/10/2019   Procedure: COLONOSCOPY WITH PROPOFOL ;  Surgeon: Therisa Bi, MD;  Location: [facility] ENDOSCOPY;  Service: Gastroenterology;  Laterality: N/A;   PARTIAL MASTECTOMY WITH AXILLARY SENTINEL LYMPH NODE BIOPSY Left 06/09/2022   Procedure: PARTIAL MASTECTOMY WITH AXILLARY SENTINEL LYMPH NODE BIOPSY;  Surgeon: Tye Millet, DO;  Location: [facility] ORS;  Service: General;  Laterality: Left;   PORTACATH PLACEMENT N/A 06/30/2022   Procedure: INSERTION PORT-A-CATH;  Surgeon: Tye Millet, DO;  Location: [facility] ORS;  Service: General;  Laterality: N/A;   TYMPANOPLASTY Left      Past OB/GYN History: G1P1001 Vaginal deliveries: 1,  Forceps/ Vacuum deliveries: 0, Cesarean section: 0 Menopausal: Yes Contraception: Hyst. Last pap smear was unknown.  Any history of abnormal pap smears: no. HM PAP   This patient has no relevant Health Maintenance data.     Medications: Patient has a current medication list which includes the following prescription(s): vibegron , fiber,  alendronate, bupropion , buspirone , calcium  carb-cholecalciferol, estradiol , hydrocodone -acetaminophen , ibuprofen , metronidazole , OVER THE COUNTER MEDICATION, OVER THE COUNTER MEDICATION, OVER THE COUNTER MEDICATION, and triamcinolone  cream.   Allergies: Patient is allergic to poison ivy extract.   Social History:  Social History   Tobacco Use   Smoking status: Former    Current packs/day: 0.00    Types: Cigarettes    Quit date: 09/09/1992    Years since quitting: 31.2   Smokeless tobacco: Current    Types: Chew   Tobacco comments:    Got pregnant  Vaping Use   Vaping status: Every Day  Substance Use Topics   Alcohol use: Not Currently    Comment: rare   Drug use: Yes    Types: Marijuana    Comment: daily    Relationship status: married Patient lives with husband.   Patient is not employed. Regular exercise: Yes:   History of abuse: No  Family History:   Family History  Problem Relation Age of Onset   Lung cancer Mother 70   Hypertension Mother    Cancer Mother    Lupus Father    Mental illness Sister    Early death Sister 44       mental   Other Sister        hit by car as a child   Mental illness Brother    Leukemia Cousin 6   Breast cancer Neg Hx      Review of Systems: Review of Systems  Constitutional:  Negative for chills and fever.  Respiratory:  Negative for cough and shortness of breath.   Cardiovascular:  Negative for chest pain and palpitations.  Gastrointestinal:  Negative for abdominal pain, blood in stool, constipation and diarrhea.  Skin:  Negative for rash.  Neurological:  Positive for weakness.  Endo/Heme/Allergies:  Bruises/bleeds easily.  Psychiatric/Behavioral:  Positive for depression. Negative for suicidal ideas.      OBJECTIVE Physical Exam: Vitals:   11/30/23 1024  BP: 119/76  Pulse: (!) 58  Weight: 114 lb (51.7 kg)    Physical Exam Vitals reviewed. Exam conducted with a chaperone present.  Constitutional:       Appearance: Normal appearance.  Pulmonary:     Effort: Pulmonary effort is normal.  Abdominal:     Palpations: Abdomen is soft.  Neurological:     General: No focal deficit present.     Mental Status: She is alert and oriented to person, place, and time.  Psychiatric:        Mood and Affect: Mood normal.        Behavior: Behavior normal. Behavior is cooperative.        Thought Content: Thought content normal.      GU / Detailed Urogynecologic Evaluation:  Pelvic Exam: Normal external female genitalia; Bartholin's and Skene's glands normal in appearance; urethral meatus normal in appearance, no urethral masses or discharge.   CST:  positive, swabs were placed in the vagina and when patient coughed she had leakage   Speculum exam reveals normal vaginal mucosa with atrophy. Cervix normal appearance. Uterus normal single, nontender. Adnexa normal adnexa.    With apex supported, anterior compartment defect was reduced  Pelvic floor strength II/V  Pelvic floor musculature: Right levator non-tender, Right obturator tender, Left levator tender, Left obturator tender  POP-Q:   POP-Q  -1.5                                            Aa   -1.5                                           Ba  -5                                              C   4                                            Gh  4                                            Pb  7                                            tvl   -2                                            Ap  -2                                            Bp  -5                                              D      Rectal Exam:  Normal external exam  Post-Void Residual (PVR) by Bladder Scan: In order to evaluate bladder emptying, we discussed obtaining a postvoid residual and patient agreed to this procedure.  Procedure: The ultrasound unit was placed on the patient's abdomen in the suprapubic region after the patient had voided.    Post  Void Residual - 11/30/23 1031       Post Void Residual   Post Void Residual 19 mL           Laboratory Results: Lab Results  Component Value Date   COLORU yellow 11/30/2023   CLARITYU clear  11/30/2023   GLUCOSEUR negative 11/30/2023   BILIRUBINUR negative 11/30/2023   SPECGRAV 1.010 11/30/2023   RBCUR negative 11/30/2023   PHUR 5.5 11/30/2023   UROBILINOGEN 0.2 11/30/2023   LEUKOCYTESUR Negative 11/30/2023    Lab Results  Component Value Date   CREATININE 0.74 11/21/2023   CREATININE 0.80 07/25/2023   CREATININE 0.85 06/13/2023    Lab Results  Component Value Date   HGBA1C 5.5 05/19/2023    Lab Results  Component Value Date   HGB 14.1 11/21/2023     ASSESSMENT AND PLAN Ms. Nistler is a 63 y.o. with:  1. Coital urinary incontinence   2. Pelvic floor dysfunction in female   3. Vaginal atrophy   4. OAB (overactive bladder)   5. SUI (stress urinary incontinence, female)    Patient had +CST on exam with pressure in vagina from a swab. We discussed options to treat this and she may be interested in urethral bulking. I did not address a sling, as she has had pelvic floor surgery and we are unsure what kind of mesh or surgical procedure she has had. Patient has pelvic floor pain and tension at bilateral obturator muscles and has pain higher in the vagina. She would benefit from pelvic floor physical therapy. Referral sent for [facility] pelvic floor PT Patient to continue on vaginal estrogen cream 3 times weekly. She can also use vitamin e, aloe vera, or coconut oil in the vaginal tract for more lubrication support. We also discussed doing a silicon blend lubrication for intercourse for glide.  We discussed the symptoms of overactive bladder (OAB), which include urinary urgency, urinary frequency, nocturia, with or without urge incontinence.  While we do not know the exact etiology of OAB, several treatment options exist. We discussed management including behavioral therapy  (decreasing bladder irritants, urge suppression strategies, timed voids, bladder retraining), physical therapy, medication; for refractory cases posterior tibial nerve stimulation, sacral neuromodulation, and intravesical botulinum toxin injection. For anticholinergic medications, we discussed the potential side effects of anticholinergics including dry eyes, dry mouth, constipation, cognitive impairment and urinary retention. For Beta-3 agonist medication, we discussed the potential side effect of elevated blood pressure which is more likely to occur in individuals with uncontrolled hypertension. Patient has been on Oxybutynin . She is having brain fog and I would prefer to change her medication. Will change her medication to Vibegron  75mg  daily. Samples given to patient to start with. Estimated cost was 0$.  Will consider trying the urethral bulking if the Gemtesa  is not effective in controlling her leakage.   Patient to follow up with one of the surgeons to discuss if she would like bulking. Am awaiting her records from [facility] where she had her pelvic floor surgery done.    Connelly Spruell G Revan Gendron, NP

## 2023-12-01 ENCOUNTER — Inpatient Hospital Stay: Admitting: Nurse Practitioner

## 2023-12-05 ENCOUNTER — Ambulatory Visit: Admitting: Nurse Practitioner

## 2023-12-15 ENCOUNTER — Ambulatory Visit (INDEPENDENT_AMBULATORY_CARE_PROVIDER_SITE_OTHER): Admitting: Internal Medicine

## 2023-12-15 ENCOUNTER — Encounter: Payer: Self-pay | Admitting: Internal Medicine

## 2023-12-15 VITALS — BP 118/64 | Ht 64.0 in | Wt 113.8 lb

## 2023-12-15 DIAGNOSIS — F32A Depression, unspecified: Secondary | ICD-10-CM

## 2023-12-15 DIAGNOSIS — F419 Anxiety disorder, unspecified: Secondary | ICD-10-CM | POA: Diagnosis not present

## 2023-12-15 DIAGNOSIS — M65332 Trigger finger, left middle finger: Secondary | ICD-10-CM

## 2023-12-15 DIAGNOSIS — M810 Age-related osteoporosis without current pathological fracture: Secondary | ICD-10-CM

## 2023-12-15 DIAGNOSIS — Z171 Estrogen receptor negative status [ER-]: Secondary | ICD-10-CM

## 2023-12-15 DIAGNOSIS — K582 Mixed irritable bowel syndrome: Secondary | ICD-10-CM

## 2023-12-15 DIAGNOSIS — C50912 Malignant neoplasm of unspecified site of left female breast: Secondary | ICD-10-CM

## 2023-12-15 DIAGNOSIS — N39491 Coital incontinence: Secondary | ICD-10-CM

## 2023-12-15 NOTE — Assessment & Plan Note (Signed)
 Continue alendronate 70 mg weekly Continue vitamin D  and calcium  OTC Encourage daily weightbearing exercise

## 2023-12-15 NOTE — Assessment & Plan Note (Signed)
 Recently started on vibegron  75 mg daily She will continue to follow with urology

## 2023-12-15 NOTE — Assessment & Plan Note (Signed)
In remission She will continue to follow with oncology 

## 2023-12-15 NOTE — Assessment & Plan Note (Signed)
 Continue high-fiber diet Not medicated Will monitor symptoms for now

## 2023-12-15 NOTE — Assessment & Plan Note (Signed)
 Continue bupropion  150 mg XL daily and buspirone  5 mg twice daily Support offered

## 2023-12-15 NOTE — Progress Notes (Signed)
 Subjective:    Patient ID: Jennifer Garrison, female    DOB: [birth year], 63 y.o.   MRN: [id number]  HPI  Patient presents to clinic today for follow-up of chronic conditions.  Anxiety and depression: Chronic, managed on bupropion  and buspirone . She is not currently seeing a therapist.  She denies SI/HI.  IBS: She reports alternating constipation and diarrhea but she reports this has improved.  She tries to manage this with diet.  Colonoscopy from 09/2019 reviewed.  Osteoporosis: She is taking alendronate, calcium  and vitamin D  as prescribed.  She tries to get weightbearing exercise and daily.  Bone density from 10/2021 reviewed.  Coital urinary incontinence: She reports mainly leakage during sex.  She is taking vibegron  as prescribed. She has been on oxybutynin  in the past. She does not follow with urology.  History of right breast cancer: s/p excision, chemo and radiation. She continues to follow with oncology.  Review of Systems     Past Medical History:  Diagnosis Date   Allergy 1970   Anxiety 2000   Arthritis 2021   Breast cancer (HCC) 05/27/2022   Cancer (HCC) 05/11/2022   Breast stage A1   Depression 2000   IBS (irritable bowel syndrome)    Trigger finger    Left Middle Finger 11/2016    Current Outpatient Medications  Medication Sig Dispense Refill   ADVANCED FIBER COMPLEX PO Take 3 tablets by mouth daily. Gummies     alendronate (FOSAMAX) 70 MG tablet Take 70 mg by mouth once a week.     buPROPion  (WELLBUTRIN  XL) 150 MG 24 hr tablet TAKE 1 TABLET DAILY 90 tablet 0   busPIRone  (BUSPAR ) 5 MG tablet TAKE 1 TABLET TWICE A DAY AS NEEDED 180 tablet 1   Calcium  Carb-Cholecalciferol (CALCIUM  600 + D) 600-5 MG-MCG TABS 1 orally twice a day     estradiol  (ESTRACE  VAGINAL) 0.1 MG/GM vaginal cream Apply 1/3 an applicator full high into the vagina at night to 3 times a week. 42.5 g 2   HYDROcodone -acetaminophen  (NORCO) 5-325 MG tablet Take 1 tablet by mouth every 6 (six) hours as  needed for up to 6 doses for moderate pain. 6 tablet 0   ibuprofen  (ADVIL ) 600 MG tablet Take 1 tablet (600 mg total) by mouth every 6 (six) hours as needed. 30 tablet 0   metroNIDAZOLE  (METROCREAM ) 0.75 % cream Apply topically as needed.     OVER THE COUNTER MEDICATION 1.5 tablets in the morning and at bedtime. Calcium  Bone Strength Supplement1     OVER THE COUNTER MEDICATION 2 tablets daily. Vitex Berry     OVER THE COUNTER MEDICATION in the morning, at noon, and at bedtime. Milk Thisle Seeds Tea     triamcinolone  cream (KENALOG ) 0.1 % Apply 1 Application topically 2 (two) times daily. Apply to nail plates twice a day for nail lifting due to chemotherapy. 28 g 0   Vibegron  75 MG TABS Take 1 tablet (75 mg total) by mouth daily. 90 tablet 5   No current facility-administered medications for this visit.    Allergies  Allergen Reactions   Poison Ivy Extract Rash    Family History  Problem Relation Age of Onset   Lung cancer Mother 18   Hypertension Mother    Cancer Mother    Lupus Father    Mental illness Sister    Early death Sister 74       mental   Other Sister        hit by  car as a child   Mental illness Brother    Leukemia Cousin 6   Breast cancer Neg Hx     Social History   Socioeconomic History   Marital status: Married    Spouse name: Alm   Number of children: 1   Years of education: Not on file   Highest education level: Associate degree: occupational, Scientist, product/process development, or vocational program  Occupational History   Not on file  Tobacco Use   Smoking status: Former    Current packs/day: 0.00    Types: Cigarettes    Quit date: 09/09/1992    Years since quitting: 31.2   Smokeless tobacco: Current    Types: Chew   Tobacco comments:    Got pregnant  Vaping Use   Vaping status: Every Day  Substance and Sexual Activity   Alcohol use: Not Currently    Comment: rare   Drug use: Yes    Types: Marijuana    Comment: daily   Sexual activity: Yes    Birth  control/protection: Post-menopausal  Other Topics Concern   Not on file  Social History Narrative   Not on file   Social Drivers of Health   Financial Resource Strain: Low Risk  (12/14/2023)   Overall Financial Resource Strain (CARDIA)    Difficulty of Paying Living Expenses: Not hard at all  Food Insecurity: No Food Insecurity (12/14/2023)   Hunger Vital Sign    Worried About Running Out of Food in the Last Year: Never true    Ran Out of Food in the Last Year: Never true  Transportation Needs: No Transportation Needs (12/14/2023)   PRAPARE - Administrator, Civil Service (Medical): No    Lack of Transportation (Non-Medical): No  Physical Activity: Sufficiently Active (12/14/2023)   Exercise Vital Sign    Days of Exercise per Week: 7 days    Minutes of Exercise per Session: 60 min  Stress: Stress Concern Present (12/14/2023)   [employer] of Occupational Health - Occupational Stress Questionnaire    Feeling of Stress: To some extent  Social Connections: Moderately Integrated (12/14/2023)   Social Connection and Isolation Panel    Frequency of Communication with Friends and Family: Three times a week    Frequency of Social Gatherings with Friends and Family: Once a week    Attends Religious Services: More than 4 times per year    Active Member of [employer] or Organizations: No    Attends Banker Meetings: Not on file    Marital Status: Married  Catering manager Violence: Not At Risk (05/27/2022)   Humiliation, Afraid, Rape, and Kick questionnaire    Fear of Current or Ex-Partner: No    Emotionally Abused: No    Physically Abused: No    Sexually Abused: No     Constitutional: Pt reports fatigue. Denies fever, malaise, headache or abrupt weight changes.  Respiratory: Denies difficulty breathing, shortness of breath, cough or sputum production.   Cardiovascular: Denies chest pain, chest tightness, palpitations or swelling in the hands or feet.   Gastrointestinal: Patient reports alternating constipation and diarrhea.  Denies abdominal pain, bloating, or blood in the stool.  GU: Pt reports urinary incontinence during sex.Denies urgency, frequency, pain with urination, burning sensation, blood in urine, odor or discharge. Musculoskeletal: Denies decrease in range of motion, difficulty with gait, muscle pain or joint pain and swelling.  Skin:   Denies redness, rashes, lesions or ulcercations.  Neurological: Denies dizziness, difficulty with memory, difficulty with speech  or problems with balance and coordination.  Psych: Patient has a history of anxiety and depression.  Denies SI/HI.  No other specific complaints in a complete review of systems (except as listed in HPI above).  Objective:   Physical Exam  BP 118/64 (BP Location: Right Arm, Patient Position: Sitting, Cuff Size: Normal)   Ht 5' 4 (1.626 m)   Wt 113 lb 12.8 oz (51.6 kg)   BMI 19.53 kg/m    Wt Readings from Last 3 Encounters:  11/30/23 114 lb (51.7 kg)  11/21/23 114 lb 4.8 oz (51.8 kg)  10/17/23 112 lb 9.6 oz (51.1 kg)    General: Appears her stated age, well developed, well nourished in NAD. Skin: Warm, dry and intact.   HEENT: Head: normal shape and size; Eyes: sclera white, no icterus, conjunctiva pink, PERRLA and EOMs intact;  Cardiovascular: Normal rate and rhythm. S1,S2 noted.  No murmur, rubs or gallops noted. Pulmonary/Chest: Normal effort and positive vesicular breath sounds. No respiratory distress. No wheezes, rales or ronchi noted.  Abdomen: Normal bowel sounds. Musculoskeletal: . No difficulty with gait.  Neurological: Alert and oriented.  Psychiatric: Mood and affect normal.  Mildly anxious appearing. Judgment and thought content normal.    BMET    Component Value Date/Time   NA 137 11/21/2023 1001   NA 138 09/17/2019 1136   K 3.8 11/21/2023 1001   CL 103 11/21/2023 1001   CO2 25 11/21/2023 1001   GLUCOSE 92 11/21/2023 1001   BUN 15  11/21/2023 1001   BUN 16 09/17/2019 1136   CREATININE 0.74 11/21/2023 1001   CREATININE 0.88 06/28/2021 0918   CALCIUM  9.1 11/21/2023 1001   GFRNONAA >60 11/21/2023 1001   GFRAA 81 09/17/2019 1136    Lipid Panel     Component Value Date/Time   CHOL 154 05/19/2023 0847   CHOL 165 09/17/2019 1136   TRIG 92 05/19/2023 0847   HDL 52 05/19/2023 0847   HDL 51 09/17/2019 1136   CHOLHDL 3.0 05/19/2023 0847   LDLCALC 83 05/19/2023 0847    CBC    Component Value Date/Time   WBC 7.9 11/21/2023 1001   WBC 7.5 07/25/2023 0916   RBC 4.88 11/21/2023 1001   HGB 14.1 11/21/2023 1001   HGB 14.9 09/17/2019 1136   HCT 43.5 11/21/2023 1001   HCT 43.8 09/17/2019 1136   PLT 223 11/21/2023 1001   PLT 250 09/17/2019 1136   MCV 89.1 11/21/2023 1001   MCV 87 09/17/2019 1136   MCH 28.9 11/21/2023 1001   MCHC 32.4 11/21/2023 1001   RDW 13.0 11/21/2023 1001   RDW 11.9 09/17/2019 1136   LYMPHSABS 1.2 11/21/2023 1001   LYMPHSABS 2.1 09/17/2019 1136   MONOABS 0.4 11/21/2023 1001   EOSABS 0.3 11/21/2023 1001   EOSABS 0.2 09/17/2019 1136   BASOSABS 0.1 11/21/2023 1001   BASOSABS 0.1 09/17/2019 1136    Hgb A1C Lab Results  Component Value Date   HGBA1C 5.5 05/19/2023           Assessment & Plan:      RTC in 6 months for your annual exam Angeline Laura, NP

## 2023-12-15 NOTE — Patient Instructions (Signed)
Eating Plan for Osteoporosis Osteoporosis causes your bones to become weak and brittle. This puts you at greater risk for bone breaks (fractures) from small bumps or falls. Making changes to your diet and increasing your physical activity can help strengthen your bones and improve your overall health. Calcium and vitamin D are nutrients that play an important role in bone health. Vitamin D helps your body use calcium and strengthen bones. It is important to get enough calcium and vitamin D as part of your eating plan for osteoporosis. What are tips for following this plan? Reading food labels Try to get at least 1,000 milligrams (mg) of calcium each day. Look for foods that have at least 50 mg of calcium per serving. Talk with your health care provider about taking a calcium supplement if you do not get enough calcium from food. Do not have more than 2,500 mg of calcium each day. This is the upper limit for food and nutritional supplements combined. Too much calcium may cause constipation and prevent you from absorbing other important nutrients. Choose foods that contain vitamin D. Take a daily vitamin supplement that contains 800-1,000 international units (IU) of vitamin D. The amount may be different depending on your age, body weight, and where you live. Talk with your dietitian or health care provider about how much vitamin D is right for you. Avoid foods that have more than 300 mg of sodium per serving. Too much sodium can cause your body to lose calcium. Talk with your dietitian or health care provider about how much sodium you are allowed each day. Shopping Do not buy foods with added salt, including: Salted snacks. Rosita Fire. Canned soups. Canned meats. Processed meats, such as bacon or precooked or cured meat like sausages or meat loaves. Smoked fish. Meal planning Eat balanced meals that contain protein foods, fruits and vegetables, and foods rich in calcium and vitamin D. Eat at least  5 servings of fruits and vegetables each day. Eat 5-6 oz (142-170 g) of lean meat, poultry, fish, eggs, or beans each day. Lifestyle Do not use any products that contain nicotine or tobacco, such as cigarettes, e-cigarettes, and chewing tobacco. If you need help quitting, ask your health care provider. If your health care provider recommends that you lose weight: Work with a dietitian to develop an eating plan that will help you reach your desired weight goal. Exercise for at least 30 minutes a day, 5 or more days a week, or as told by your health care provider. Work with a physical therapist to develop an exercise plan that includes flexibility, balance, and strength exercises. Do not focus only on aerobic exercise. Do not drink alcohol if: Your health care provider tells you not to drink. You are pregnant, may be pregnant, or are planning to become pregnant. If you drink alcohol: Limit how much you use to: 0-1 drink a day for women. 0-2 drinks a day for men. Be aware of how much alcohol is in your drink. In the [place] one drink equals one 12 oz bottle of beer (355 mL), one 5 oz glass of wine (148 mL), or one 1 oz glass of hard liquor (44 mL). What foods should I eat? Foods high in calcium  Yogurt. Yogurt with fruit. Milk. Evaporated skim milk. Dry milk powder. Calcium-fortified orange juice. Parmesan cheese. Part-skim ricotta cheese. Natural hard cheese. Cream cheese. Cottage cheese. Canned sardines. Canned salmon. Calcium-treated tofu. Calcium-fortified cereal bar. Calcium-fortified cereal. Calcium-fortified graham crackers. Cooked collard greens. Turnip greens. Broccoli.  Kale. Almonds. White beans. Corn tortilla. Foods high in vitamin D Cod liver oil. Fatty fish, such as tuna, mackerel, and salmon. Milk. Fortified soy milk. Fortified fruit juice. Yogurt. Margarine. Egg yolks. Foods high in protein Beef. Lamb. Pork tenderloin. Chicken breast. Tuna (canned). Fish  fillet. Tofu. Cooked soy beans. Soy patty. Beans (canned or cooked). Cottage cheese. Yogurt. Peanut butter. Pumpkin seeds. Nuts. Sunflower seeds. Hard cheese. Milk or other milk products, such as soy milk. The items listed above may not be a complete list of foods and beverages you can eat. Contact a dietitian for more options. Summary Calcium and vitamin D are nutrients that play an important role in bone health and are an important part of your eating plan for osteoporosis. Eat balanced meals that contain protein foods, fruits and vegetables, and foods rich in calcium and vitamin D. Avoid foods that have more than 300 mg of sodium per serving. Too much sodium can cause your body to lose calcium. Exercise is an important part of prevention and treatment of osteoporosis. Aim for at least 30 minutes a day, 5 days a week. This information is not intended to replace advice given to you by your health care provider. Make sure you discuss any questions you have with your health care provider. Document Revised: 08/29/2019 Document Reviewed: 08/29/2019 Elsevier Patient Education  2024 [employer].

## 2023-12-22 LAB — COLOGUARD: COLOGUARD: NEGATIVE

## 2024-01-11 ENCOUNTER — Ambulatory Visit
Admission: RE | Admit: 2024-01-11 | Discharge: 2024-01-11 | Disposition: A | Discharge status: Home / Self Care | Source: Ambulatory Visit | Attending: Radiation Oncology | Admitting: Radiation Oncology

## 2024-01-11 ENCOUNTER — Encounter: Payer: Self-pay | Admitting: Radiation Oncology

## 2024-01-11 VITALS — BP 125/87 | HR 69 | Temp 97.3°F | Resp 16 | Wt 116.9 lb

## 2024-01-11 DIAGNOSIS — Z923 Personal history of irradiation: Secondary | ICD-10-CM | POA: Insufficient documentation

## 2024-01-11 DIAGNOSIS — Z171 Estrogen receptor negative status [ER-]: Secondary | ICD-10-CM | POA: Diagnosis not present

## 2024-01-11 DIAGNOSIS — Z1721 Progesterone receptor positive status: Secondary | ICD-10-CM | POA: Diagnosis not present

## 2024-01-11 DIAGNOSIS — C50912 Malignant neoplasm of unspecified site of left female breast: Secondary | ICD-10-CM | POA: Diagnosis present

## 2024-01-11 NOTE — Progress Notes (Signed)
 Radiation Oncology Follow up Note  Name: Jennifer Garrison   Date:   01/11/2024 MRN:  [id number] DOB: [birth year]    This 63 y.o. female presents to the clinic today for 59-month follow-up status post radiation therapy to her left breast for stage Ia ER/PR negative HER2/neu overexpressed invasive mammary carcinoma.  REFERRING PROVIDER: Antonette Garrison ORN, NP  HPI: Patient is a 63 year old female now out 13 months having completed whole breast radiation to her left breast for stage Ia ER/PR negative HER2/neu overexpressed invasive mammary carcinoma.  She is seen today in routine follow-up she is doing well..  She is completed all adjuvant Herceptin .  She specifically denies breast tenderness cough or bone pain.  She had a mammogram back in July which I have reviewed were BI-RADS 2 benign.  She is not on endocrine therapy based on her ER/PR negative disease.  COMPLICATIONS OF TREATMENT: none  FOLLOW UP COMPLIANCE: keeps appointments   PHYSICAL EXAM:  BP 125/87   Pulse 69   Temp (!) 97.3 F (36.3 C)   Resp 16   Wt 116 lb 14.4 oz (53 kg)   BMI 20.07 kg/m  Patient has bilateral breast implants.  No evidence of mass or nodularity in either breast is noted.  No axillary or supraclavicular adenopathy is identified.  Well-developed well-nourished patient in NAD. HEENT reveals PERLA, EOMI, discs not visualized.  Oral cavity is clear. No oral mucosal lesions are identified. Neck is clear without evidence of cervical or supraclavicular adenopathy. Lungs are clear to A&P. Cardiac examination is essentially unremarkable with regular rate and rhythm without murmur rub or thrill. Abdomen is benign with no organomegaly or masses noted. Motor sensory and DTR levels are equal and symmetric in the upper and lower extremities. Cranial nerves II through XII are grossly intact. Proprioception is intact. No peripheral adenopathy or edema is identified. No motor or sensory levels are noted. Crude visual fields are within  normal range.  RADIOLOGY RESULTS: Mammograms reviewed compatible with above-stated findings  PLAN: At time patient is now 13 months out from whole breast radiation doing well with no evidence of disease.  I am pleased with her overall progress.  Of asked to see her back in 1 year for follow-up.  Patient continues follow-up care with medical oncology.  Patient knows to call with any concerns.  I would like to take this opportunity to thank you for allowing me to participate in the care of your patient.SABRA Marcey Penton, MD

## 2024-01-12 ENCOUNTER — Other Ambulatory Visit: Payer: Self-pay

## 2024-01-23 ENCOUNTER — Encounter: Payer: Self-pay | Admitting: Oncology

## 2024-01-24 ENCOUNTER — Other Ambulatory Visit: Payer: Self-pay | Admitting: *Deleted

## 2024-01-24 DIAGNOSIS — C50912 Malignant neoplasm of unspecified site of left female breast: Secondary | ICD-10-CM

## 2024-01-24 NOTE — Telephone Encounter (Signed)
 yes

## 2024-01-26 ENCOUNTER — Other Ambulatory Visit: Payer: Self-pay | Admitting: Internal Medicine

## 2024-01-27 NOTE — Telephone Encounter (Signed)
 Requested Prescriptions  Pending Prescriptions Disp Refills   buPROPion  (WELLBUTRIN  XL) 150 MG 24 hr tablet [Pharmacy Med Name: BUPROPION  HCL XL TABS 150MG ] 90 tablet 0    Sig: TAKE 1 TABLET DAILY     Psychiatry: Antidepressants - bupropion  Passed - 01/27/2024  9:00 AM      Passed - Cr in normal range and within 360 days    Creatinine  Date Value Ref Range Status  11/21/2023 0.74 0.44 - 1.00 mg/dL Final   Creat  Date Value Ref Range Status  06/28/2021 0.88 0.50 - 1.05 mg/dL Final         Passed - AST in normal range and within 360 days    AST  Date Value Ref Range Status  11/21/2023 23 15 - 41 U/L Final         Passed - ALT in normal range and within 360 days    ALT  Date Value Ref Range Status  11/21/2023 20 0 - 44 U/L Final         Passed - Completed PHQ-2 or PHQ-9 in the last 360 days      Passed - Last BP in normal range    BP Readings from Last 1 Encounters:  01/11/24 125/87         Passed - Valid encounter within last 6 months    Recent Outpatient Visits           1 month ago Trigger middle finger of left hand   [place] [facility] [place], Angeline ORN, NP   8 months ago Encounter for general adult medical examination with abnormal findings   [place] [facility] [place], Angeline ORN, NP       Future Appointments             In 2 months Guadlupe Lianne DASEN, MD [facility] at [facility], [facility]

## 2024-01-30 ENCOUNTER — Encounter: Payer: Self-pay | Admitting: Internal Medicine

## 2024-01-30 NOTE — Telephone Encounter (Signed)
 Virtual is fine but I am going to want you to have the results of those tests before I prescribe any of those medications that she listed.  That would be a last resort for me to prescribe those specific medications for her condition.

## 2024-01-30 NOTE — Telephone Encounter (Signed)
 At this point, I would recommend that she schedule appointment with me to discuss further.  There are some additional testing that I can do and we can discuss the medications that she researched.

## 2024-01-31 ENCOUNTER — Ambulatory Visit: Admitting: Internal Medicine

## 2024-02-01 ENCOUNTER — Encounter: Payer: Self-pay | Admitting: Internal Medicine

## 2024-02-01 ENCOUNTER — Encounter: Payer: Self-pay | Admitting: Oncology

## 2024-02-06 ENCOUNTER — Encounter: Payer: Self-pay | Admitting: Internal Medicine

## 2024-02-06 NOTE — Telephone Encounter (Signed)
 Patient's concerns have already been addressed by Dr. Rao/

## 2024-02-07 ENCOUNTER — Ambulatory Visit: Admitting: Obstetrics

## 2024-02-09 ENCOUNTER — Other Ambulatory Visit: Payer: Self-pay | Admitting: Nurse Practitioner

## 2024-02-13 ENCOUNTER — Encounter: Payer: Self-pay | Admitting: Internal Medicine

## 2024-02-16 ENCOUNTER — Telehealth (INDEPENDENT_AMBULATORY_CARE_PROVIDER_SITE_OTHER): Admitting: Internal Medicine

## 2024-02-16 ENCOUNTER — Encounter: Payer: Self-pay | Admitting: Internal Medicine

## 2024-02-16 DIAGNOSIS — F419 Anxiety disorder, unspecified: Secondary | ICD-10-CM

## 2024-02-16 DIAGNOSIS — R4189 Other symptoms and signs involving cognitive functions and awareness: Secondary | ICD-10-CM | POA: Diagnosis not present

## 2024-02-16 DIAGNOSIS — R5382 Chronic fatigue, unspecified: Secondary | ICD-10-CM | POA: Diagnosis not present

## 2024-02-16 DIAGNOSIS — T451X5A Adverse effect of antineoplastic and immunosuppressive drugs, initial encounter: Secondary | ICD-10-CM

## 2024-02-16 DIAGNOSIS — F32A Depression, unspecified: Secondary | ICD-10-CM | POA: Diagnosis not present

## 2024-02-16 DIAGNOSIS — C50912 Malignant neoplasm of unspecified site of left female breast: Secondary | ICD-10-CM

## 2024-02-16 DIAGNOSIS — Z853 Personal history of malignant neoplasm of breast: Secondary | ICD-10-CM

## 2024-02-16 DIAGNOSIS — R41840 Attention and concentration deficit: Secondary | ICD-10-CM

## 2024-02-16 MED ORDER — MODAFINIL 100 MG PO TABS: 100.0000 mg | ORAL_TABLET | Freq: Every day | ORAL | 0 refills | Status: DC

## 2024-02-16 NOTE — Patient Instructions (Signed)
 Chronic Fatigue Syndrome Chronic fatigue syndrome (CFS) is a condition that causes extreme tiredness (fatigue). This condition is also known as myalgic encephalomyelitis (ME). The fatigue in CFS does not improve with rest, and it gets worse with physical or mental activity. Several other symptoms may occur along with fatigue. Symptoms may come and go, but they generally last for months. Sometimes, CFS gets better over time. In other cases, it can be a lifelong condition. There is no cure, but there are many possible treatments. You will need to work with your health care providers to find a treatment plan that works best for you. What are the causes? The cause of this condition is not known. CFS may be caused by a combination of things. Possible causes include: An infection. An abnormal body defense system (abnormal immune system). Changes in how your body produces energy. Genetic factors. Physical or emotional stress. Having a poor diet. What increases the risk? You are more likely to develop this condition if: You are female. You are a young adult or middle-aged adult. You have a family history of CFS. What are the signs or symptoms? The main symptom of this condition is fatigue that is severe enough to interfere with day-to-day activities. This fatigue does not get better with rest, and it gets worse with physical or mental activity. There are eight other major symptoms of CFS: Discomfort and lack of energy (malaise) that lasts more than 24 hours after physical activity. Sleep that does not relieve fatigue (unrefreshing sleep). Short-term memory loss or confusion. Joint pain without redness or swelling. Muscle aches. Headaches. Painful and swollen glands (lymph nodes) in the neck or under the arms. Sore throat. Other symptoms can include: Cramps in the abdomen, constipation, or diarrhea (irritable bowel syndrome). Chills and night sweats. Vision changes. Dizziness and mental  confusion (brain fog). Clumsiness. Sensitivity to food, noise, or odors. Mood swings, depression, or anxiety attacks. How is this diagnosed? There are no tests that can diagnose this condition. Your health care provider will make the diagnosis based on: Your symptoms and medical history. A physical exam and a mental health exam. Tests to rule out other conditions. It is important to make sure that your symptoms are not caused by another medical condition. Tests may include lab tests or X-rays. For your health care provider to diagnose CFS: You must have had fatigue for at least 6 months in a row. Fatigue must be your first symptom, and it must be severe enough to interfere with day-to-day activities. You must also have at least four of the eight other major symptoms of CFS. There must be no other cause found for the fatigue. How is this treated? There is no cure for CFS. The condition affects everyone differently. You will need to work with your team of health care providers to find the best treatments for your symptoms. Your team may include your primary care provider, physical and exercise therapists, and mental health therapists. Treatment may include: Having a regular bedtime routine to help improve your sleep. Avoiding caffeine and alcohol. Doing light exercise and stretching during the day. You may also want to try movement exercises, such as yoga or tai chi. Taking medicines to help you sleep or to relieve joint or muscle pain. Learning and practicing relaxation techniques, such as deep breathing and muscle relaxation. Using memory aids or doing puzzles to improve memory and concentration. Getting care for your body and mental well-being, such as: Seeing a mental health therapist to evaluate and  treat depression, if necessary. Cognitive behavioral therapy (CBT). This therapy changes the way you think or act in response to the fatigue. This may help improve how you feel. Trying massage  therapy and acupuncture. Follow these instructions at home: Eating and drinking  Avoid caffeine and alcohol. Avoid heavy meals in the evening. Eat a healthy diet that includes foods such as vegetables, fruits, fish, and lean meats. Activity Rest as told by your health care provider. Avoid fatigue by pacing yourself during the day and getting enough sleep at night. Exercise as told by your health care provider. Go to bed and get up at the same time every day. Lifestyle Ask your health care provider whether you should keep a journal. Your health care provider will tell you what information to write in the journal. This may include when you have fatigue and how medicines and other behaviors or treatments help to reduce the fatigue. Consider joining a CFS support group. Avoid stress, and use stress-reducing techniques that you learn in therapy. General instructions Take over-the-counter and prescription medicines only as told by your health care provider. Do not use herbal or dietary supplements unless they are approved by your health care provider. Maintain a healthy weight. Keep all follow-up visits. This is important. Where to find more information Get more information or find a support group near you at one of these links: American Myalgic Encephalomyelitis and Chronic Fatigue Syndrome Society: ammes.org Centers for Disease Control and Prevention: FootballExhibition.com.br Contact a health care provider if: Your symptoms do not get better or they get worse. You feel angry, guilty, anxious, or depressed. Get help right away if: You have thoughts about hurting yourself or others. Get help right away if you feel like you may hurt yourself or others, or have thoughts about taking your own life. Go to your nearest emergency room or: Call 911. Call the National Suicide Prevention Lifeline at 801-146-7745 or 988. This is open 24 hours a day. Text the Crisis Text Line at 754-243-9013. Summary Chronic  fatigue syndrome (CFS) is a condition that causes extreme tiredness (fatigue). This fatigue does not improve with rest, and it gets worse with physical or mental activity. There is no cure for CFS. The condition affects everyone differently. You will need to work with your team of health care providers to find the best treatments for your symptoms. Avoid stress, and use stress-reducing techniques that you learn in therapy. Contact a health care provider if your symptoms do not get better or they get worse. This information is not intended to replace advice given to you by your health care provider. Make sure you discuss any questions you have with your health care provider. Document Revised: 01/04/2021 Document Reviewed: 01/04/2021 Elsevier Patient Education  2024 [employer].

## 2024-02-16 NOTE — Progress Notes (Signed)
 Virtual Visit via Video Note  I connected with Jennifer Garrison on 02/16/24 at  3:00 PM EST by a video enabled telemedicine application and verified that I am speaking with the correct person using two identifiers.  Location: Patient: Home Provider: Office  Person's participating in this video call: Angeline Laura, NP-C and Jaquilla Woodroof   I discussed the limitations of evaluation and management by telemedicine and the availability of in person appointments. The patient expressed understanding and agreed to proceed.  History of Present Illness:   Discussed the use of AI scribe software for clinical note transcription with the patient, who gave verbal consent to proceed.  Jennifer Garrison is a 63 year old female with a history of breast cancer who presents with persistent fatigue, brain fog, difficulty titrating and mood concerns.  She experiences persistent fatigue. She underwent chemotherapy and radiation for HER2 positive, estrogen and progesterone  receptor negative breast cancer. She is not on endocrine therapy due to her ER negative status. She does not believe that her sleep quality is the primary issue affecting her daytime fatigue.  She has a history of anxiety and depression and is currently taking bupropion  and buspirone .  She is unsure if her mood issues are contributing to her current symptoms.     Past Medical History:  Diagnosis Date   Allergy 1970   Anxiety 2000   Arthritis 2021   Breast cancer (HCC) 05/27/2022   Cancer (HCC) 05/11/2022   Breast stage A1   Depression 2000   IBS (irritable bowel syndrome)    Trigger finger    Left Middle Finger 11/2016    Current Outpatient Medications  Medication Sig Dispense Refill   ADVANCED FIBER COMPLEX PO Take 3 tablets by mouth daily. Gummies     alendronate (FOSAMAX) 70 MG tablet Take 70 mg by mouth once a week.     buPROPion  (WELLBUTRIN  XL) 150 MG 24 hr tablet TAKE 1 TABLET DAILY 90 tablet 0   busPIRone  (BUSPAR ) 5 MG tablet  TAKE 1 TABLET TWICE A DAY AS NEEDED 180 tablet 1   Calcium  Carb-Cholecalciferol (CALCIUM  600 + D) 600-5 MG-MCG TABS 1 orally twice a day     estradiol  (ESTRACE  VAGINAL) 0.1 MG/GM vaginal cream Apply 1/3 an applicator full high into the vagina at night to 3 times a week. 42.5 g 2   ibuprofen  (ADVIL ) 600 MG tablet Take 1 tablet (600 mg total) by mouth every 6 (six) hours as needed. 30 tablet 0   metroNIDAZOLE  (METROCREAM ) 0.75 % cream Apply topically as needed.     OVER THE COUNTER MEDICATION 1.5 tablets in the morning and at bedtime. Calcium  Bone Strength Supplement1     triamcinolone  cream (KENALOG ) 0.1 % Apply 1 Application topically 2 (two) times daily. Apply to nail plates twice a day for nail lifting due to chemotherapy. 28 g 0   Vibegron  75 MG TABS Take 1 tablet (75 mg total) by mouth daily. 90 tablet 5   No current facility-administered medications for this visit.    Allergies  Allergen Reactions   Poison Ivy Extract Rash    Family History  Problem Relation Age of Onset   Lung cancer Mother 86   Hypertension Mother    Cancer Mother    Lupus Father    Mental illness Sister    Early death Sister 17       mental   Other Sister        hit by car as a child   Mental  illness Brother    Leukemia Cousin 6   Breast cancer Neg Hx     Social History   Socioeconomic History   Marital status: Married    Spouse name: Alm   Number of children: 1   Years of education: Not on file   Highest education level: 12th grade  Occupational History   Not on file  Tobacco Use   Smoking status: Former    Current packs/day: 0.00    Types: Cigarettes    Quit date: 09/09/1992    Years since quitting: 31.4   Smokeless tobacco: Current    Types: Chew   Tobacco comments:    Got pregnant  Vaping Use   Vaping status: Every Day  Substance and Sexual Activity   Alcohol use: Not Currently    Comment: rare   Drug use: Yes    Types: Marijuana    Comment: daily   Sexual activity: Yes     Birth control/protection: Post-menopausal  Other Topics Concern   Not on file  Social History Narrative   Not on file   Social Drivers of Health   Financial Resource Strain: Low Risk  (01/30/2024)   Overall Financial Resource Strain (CARDIA)    Difficulty of Paying Living Expenses: Not hard at all  Food Insecurity: No Food Insecurity (01/30/2024)   Hunger Vital Sign    Worried About Running Out of Food in the Last Year: Never true    Ran Out of Food in the Last Year: Never true  Transportation Needs: No Transportation Needs (01/30/2024)   PRAPARE - Administrator, Civil Service (Medical): No    Lack of Transportation (Non-Medical): No  Physical Activity: Sufficiently Active (01/30/2024)   Exercise Vital Sign    Days of Exercise per Week: 7 days    Minutes of Exercise per Session: 80 min  Stress: Stress Concern Present (01/30/2024)   [employer] of Occupational Health - Occupational Stress Questionnaire    Feeling of Stress: Very much  Social Connections: Moderately Integrated (01/30/2024)   Social Connection and Isolation Panel    Frequency of Communication with Friends and Family: More than three times a week    Frequency of Social Gatherings with Friends and Family: Once a week    Attends Religious Services: More than 4 times per year    Active Member of [employer] or Organizations: No    Attends Banker Meetings: Not on file    Marital Status: Married  Catering Manager Violence: Not At Risk (05/27/2022)   Humiliation, Afraid, Rape, and Kick questionnaire    Fear of Current or Ex-Partner: No    Emotionally Abused: No    Physically Abused: No    Sexually Abused: No     Constitutional: Pt reports fatigue. Denies fever, malaise, headache or abrupt weight changes.  Respiratory: Denies difficulty breathing, shortness of breath, cough or sputum production.   Cardiovascular: Denies chest pain, chest tightness, palpitations or swelling in the hands or feet.   Musculoskeletal: Denies decrease in range of motion, difficulty with gait, muscle pain or joint pain and swelling.  Skin: Denies redness, rashes, lesions or ulcercations.  Neurological: Pt reports brain fog, difficulty concentrating, insomnia. Denies dizziness, difficulty with memory, difficulty with speech or problems with balance and coordination.  Psych: Patient has a history of anxiety and depression.  Denies SI/HI.  No other specific complaints in a complete review of systems (except as listed in HPI above).  Observations/Objective:   Wt Readings from Last  3 Encounters:  01/11/24 116 lb 14.4 oz (53 kg)  12/15/23 113 lb 12.8 oz (51.6 kg)  11/30/23 114 lb (51.7 kg)    General: Appears her stated age, well developed, well nourished in NAD. Pulmonary/Chest: Normal effort. No respiratory distress.  Neurological: Alert and oriented. Coordination normal.  Psychiatric: Mood and affect normal. Behavior is normal. Judgment and thought content normal.     BMET    Component Value Date/Time   NA 137 11/21/2023 1001   NA 138 09/17/2019 1136   K 3.8 11/21/2023 1001   CL 103 11/21/2023 1001   CO2 25 11/21/2023 1001   GLUCOSE 92 11/21/2023 1001   BUN 15 11/21/2023 1001   BUN 16 09/17/2019 1136   CREATININE 0.74 11/21/2023 1001   CREATININE 0.88 06/28/2021 0918   CALCIUM  9.1 11/21/2023 1001   GFRNONAA >60 11/21/2023 1001   GFRAA 81 09/17/2019 1136    Lipid Panel     Component Value Date/Time   CHOL 154 05/19/2023 0847   CHOL 165 09/17/2019 1136   TRIG 92 05/19/2023 0847   HDL 52 05/19/2023 0847   HDL 51 09/17/2019 1136   CHOLHDL 3.0 05/19/2023 0847   LDLCALC 83 05/19/2023 0847    CBC    Component Value Date/Time   WBC 7.9 11/21/2023 1001   WBC 7.5 07/25/2023 0916   RBC 4.88 11/21/2023 1001   HGB 14.1 11/21/2023 1001   HGB 14.9 09/17/2019 1136   HCT 43.5 11/21/2023 1001   HCT 43.8 09/17/2019 1136   PLT 223 11/21/2023 1001   PLT 250 09/17/2019 1136   MCV 89.1  11/21/2023 1001   MCV 87 09/17/2019 1136   MCH 28.9 11/21/2023 1001   MCHC 32.4 11/21/2023 1001   RDW 13.0 11/21/2023 1001   RDW 11.9 09/17/2019 1136   LYMPHSABS 1.2 11/21/2023 1001   LYMPHSABS 2.1 09/17/2019 1136   MONOABS 0.4 11/21/2023 1001   EOSABS 0.3 11/21/2023 1001   EOSABS 0.2 09/17/2019 1136   BASOSABS 0.1 11/21/2023 1001   BASOSABS 0.1 09/17/2019 1136    Hgb A1C Lab Results  Component Value Date   HGBA1C 5.5 05/19/2023       Assessment and Plan:   Assessment and Plan    Chronic fatigue following chemotherapy for breast cancer Chronic fatigue likely secondary to chemotherapy for HER2 positive, ER/PR negative breast cancer. Hormone replacement therapy not recommended due to increased breast cancer risk. Adjusting antidepressants and anti-anxiety medications unlikely to address fatigue. - Prescribed Provigil  100 mg once daily in the morning. - Instructed to monitor response within one week and adjust dose if necessary. - Discussed potential need for higher doses if initial dose is ineffective. - Advised to contact if no improvement after 2-3 weeks for alternative options.  History of breast cancer, HER2 positive, ER/PR negative, treated with chemotherapy and radiation HER2 positive, ER/PR negative breast cancer treated with chemotherapy and radiation. No endocrine therapy due to ER/PR negative status. Hormone replacement therapy contraindicated due to increased breast cancer risk.  Depression and anxiety Managed with Wellbutrin  and buspirone . - Continue current medications for depression and anxiety.         RTC in 4 months for annual exam Follow Up Instructions:    I discussed the assessment and treatment plan with the patient. The patient was provided an opportunity to ask questions and all were answered. The patient agreed with the plan and demonstrated an understanding of the instructions.   The patient was advised to call back or seek  an in-person  evaluation if the symptoms worsen or if the condition fails to improve as anticipated.   Angeline Laura, NP

## 2024-02-17 ENCOUNTER — Encounter: Payer: Self-pay | Admitting: Internal Medicine

## 2024-02-20 ENCOUNTER — Encounter: Payer: Self-pay | Admitting: Internal Medicine

## 2024-02-20 ENCOUNTER — Other Ambulatory Visit (HOSPITAL_COMMUNITY): Payer: Self-pay

## 2024-02-20 ENCOUNTER — Telehealth: Payer: Self-pay | Admitting: Pharmacy Technician

## 2024-02-20 NOTE — Telephone Encounter (Signed)
 Can we please send this to the prior auth team to see if it needs a prior auth?

## 2024-02-20 NOTE — Telephone Encounter (Signed)
 Pharmacy Patient Advocate Encounter   Received notification from Onbase that prior authorization for Modafinil  100MG  tablets is required/requested.   Insurance verification completed.   The patient is insured through [employer].   Per test claim: PA required; PA submitted to above mentioned insurance via Latent Key/confirmation #/EOC [id number] Status is pending

## 2024-03-04 ENCOUNTER — Other Ambulatory Visit (HOSPITAL_COMMUNITY): Payer: Self-pay

## 2024-03-04 NOTE — Telephone Encounter (Signed)
 Thanks, Could you add this to her chart? The most recent aplicable office visit would be best, but at least to her problem list.  We will need proper documentation for the idiopathic hypersomnia to submit a new PA. Without it may result in another denial.

## 2024-03-04 NOTE — Telephone Encounter (Signed)
 Pharmacy Patient Advocate Encounter  Received notification from [employer] that Prior Authorization for Modafinil  100MG  tablets has been DENIED.  Full denial letter will be uploaded to the media tab. See denial reason below.   PA #/Case ID/Reference #: [id number]

## 2024-03-04 NOTE — Telephone Encounter (Signed)
 MI able to change the diagnosis and see if it is covered for idiopathic hypersomnia?

## 2024-03-06 ENCOUNTER — Encounter: Payer: Self-pay | Admitting: Oncology

## 2024-03-13 ENCOUNTER — Encounter: Payer: Self-pay | Admitting: Internal Medicine

## 2024-03-13 MED ORDER — MODAFINIL 200 MG PO TABS: 200.0000 mg | ORAL_TABLET | Freq: Every day | ORAL | 0 refills | Status: DC

## 2024-03-14 ENCOUNTER — Other Ambulatory Visit (HOSPITAL_COMMUNITY): Payer: Self-pay

## 2024-03-15 ENCOUNTER — Ambulatory Visit: Admitting: Podiatry

## 2024-03-15 ENCOUNTER — Encounter: Payer: Self-pay | Admitting: Podiatry

## 2024-03-15 VITALS — Ht 64.0 in | Wt 116.9 lb

## 2024-03-15 DIAGNOSIS — L603 Nail dystrophy: Secondary | ICD-10-CM | POA: Diagnosis not present

## 2024-03-15 NOTE — Progress Notes (Signed)
 "  Chief Complaint  Patient presents with   Nail Problem    Pt is here due to right great toenails, she states when she made the appointment her toenail was bothering her, tender to touch, states not as bad as before, thinks some of the nail is digging into the skin.    HPI: 63 y.o. female presenting today for evaluation of tenderness associated to the right hallux nail plate  Past Medical History:  Diagnosis Date   Allergy 1970   Anxiety 2000   Arthritis 2021   Breast cancer (HCC) 05/27/2022   Cancer (HCC) 05/11/2022   Breast stage A1   Depression 2000   IBS (irritable bowel syndrome)    Trigger finger    Left Middle Finger 11/2016    Past Surgical History:  Procedure Laterality Date   ABDOMINAL HYSTERECTOMY  02/2016   Only cervix remains (done for prolapse)   ABDOMINAL HYSTERECTOMY  2017   AUGMENTATION MAMMAPLASTY Bilateral 2010   BREAST BIOPSY Left 05/17/2022   US  BX, ribbon clip, path pending   BREAST BIOPSY Left 05/17/2022   US  LT BREAST BX W LOC DEV 1ST LESION IMG BX SPEC US  GUIDE 05/17/2022 [facility]   BREAST CYST ASPIRATION Left 2010   neg   BREAST ENHANCEMENT SURGERY  1998   CHOLECYSTECTOMY  1991   COLONOSCOPY WITH PROPOFOL  N/A 10/10/2019   Procedure: COLONOSCOPY WITH PROPOFOL ;  Surgeon: Therisa Bi, MD;  [facility] ENDOSCOPY;  Service: Gastroenterology;  Laterality: N/A;   PARTIAL MASTECTOMY WITH AXILLARY SENTINEL LYMPH NODE BIOPSY Left 06/09/2022   Procedure: PARTIAL MASTECTOMY WITH AXILLARY SENTINEL LYMPH NODE BIOPSY;  Surgeon: Tye Millet, DO;  Location: [facility] ORS;  Service: General;  Laterality: Left;   PORTACATH PLACEMENT N/A 06/30/2022   Procedure: INSERTION PORT-A-CATH;  Surgeon: Tye Millet, DO;  Location: [facility] ORS;  Service: General;  Laterality: N/A;   TYMPANOPLASTY Left     Allergies[1]   Physical Exam: General: The patient is alert and oriented x3 in no acute distress.  Dermatology: Skin is warm, dry and supple bilateral lower  extremities.  Dystrophic nonviable nail plate noted to the distal half of the right hallux.  The base of the nail appears to be healthy demonstrating good regrowth of the nail plate  Vascular: Palpable pedal pulses bilaterally. Capillary refill within normal limits.  No appreciable edema.  No erythema.  Neurological: Grossly intact via light touch  Musculoskeletal Exam: No pedal deformities noted   Assessment/Plan of Care: 1.  Dystrophic nail overlying new healthy nail regrowth right great toe  -Patient evaluated -Mechanical debridement of the unhealthy nonviable nail to the distal aspect of the toe was performed today with a nail nipper without incident or bleeding.  Patient did feel some relief -OTC topical antifungal Tolcylen was dispensed.  Apply daily as the nail continues to grow out -Return to clinic PRN       Thresa EMERSON Sar, DPM [facility]  Dr. Thresa EMERSON Sar, DPM    [address] [place], [place] [address]                Office [phone]  Fax [phone]        [1]  Allergies Allergen Reactions   Poison Ivy Extract Rash   "

## 2024-03-19 ENCOUNTER — Ambulatory Visit: Admitting: Podiatry

## 2024-03-25 ENCOUNTER — Encounter: Payer: Self-pay | Admitting: Internal Medicine

## 2024-03-25 ENCOUNTER — Ambulatory Visit (INDEPENDENT_AMBULATORY_CARE_PROVIDER_SITE_OTHER): Admitting: Internal Medicine

## 2024-03-25 VITALS — BP 120/70 | HR 77 | Temp 98.3°F | Ht 64.0 in | Wt 110.2 lb

## 2024-03-25 DIAGNOSIS — H6122 Impacted cerumen, left ear: Secondary | ICD-10-CM | POA: Diagnosis not present

## 2024-03-25 DIAGNOSIS — T161XXA Foreign body in right ear, initial encounter: Secondary | ICD-10-CM

## 2024-03-25 DIAGNOSIS — J101 Influenza due to other identified influenza virus with other respiratory manifestations: Secondary | ICD-10-CM | POA: Diagnosis not present

## 2024-03-25 DIAGNOSIS — R058 Other specified cough: Secondary | ICD-10-CM

## 2024-03-25 MED ORDER — BENZONATATE 100 MG PO CAPS: 100.0000 mg | ORAL_CAPSULE | Freq: Two times a day (BID) | ORAL | 0 refills | Status: DC | PRN

## 2024-03-25 MED ORDER — PREDNISONE 10 MG PO TABS: ORAL_TABLET | ORAL | 0 refills | Status: DC

## 2024-03-25 NOTE — Patient Instructions (Signed)

## 2024-03-25 NOTE — Progress Notes (Signed)
 "  Subjective:    Patient ID: Jennifer Garrison, female    DOB: 1960/08/15, 63 y.o.   MRN: [id number]  HPI  Discussed the use of AI scribe software for clinical note transcription with the patient, who gave verbal consent to proceed.  Jennifer Garrison is a 63 year old female who presents with persistent symptoms following a diagnosis of flu A.  She has not felt well since March 15, 2024, and was diagnosed with flu A at an urgent care visit on March 22, 2024. She was treated with zofran  and promethazine cough syrup. Despite treatment, she continues to experience a deep, rattling cough without expectoration.  Additional symptoms include headache, nasal congestion, runny nose, ear pressure, and dizziness. No sore throat, ear pain, or significant sinus pressure. She experienced diarrhea the day before the visit but denies nausea and vomiting.  Her appetite has been poor, having consumed only a can of soup and some broth over the past four days, resulting in a six-pound weight loss. She has been taking Tylenol  over the counter.  She does not smoke and has no history of asthma.  Review of Systems   Past Medical History:  Diagnosis Date   Allergy 1970   Anxiety 2000   Arthritis 2021   Breast cancer (HCC) 05/27/2022   Cancer (HCC) 05/11/2022   Breast stage A1   Depression 2000   IBS (irritable bowel syndrome)    Trigger finger    Left Middle Finger 11/2016    Current Outpatient Medications  Medication Sig Dispense Refill   ADVANCED FIBER COMPLEX PO Take 3 tablets by mouth daily. Gummies     alendronate (FOSAMAX) 70 MG tablet Take 70 mg by mouth once a week.     buPROPion  (WELLBUTRIN  XL) 150 MG 24 hr tablet TAKE 1 TABLET DAILY 90 tablet 0   busPIRone  (BUSPAR ) 5 MG tablet TAKE 1 TABLET TWICE A DAY AS NEEDED 180 tablet 1   Calcium  Carb-Cholecalciferol (CALCIUM  600 + D) 600-5 MG-MCG TABS 1 orally twice a day     estradiol  (ESTRACE  VAGINAL) 0.1 MG/GM vaginal cream Apply 1/3 an applicator  full high into the vagina at night to 3 times a week. 42.5 g 2   ibuprofen  (ADVIL ) 600 MG tablet Take 1 tablet (600 mg total) by mouth every 6 (six) hours as needed. 30 tablet 0   metroNIDAZOLE  (METROCREAM ) 0.75 % cream Apply topically as needed.     modafinil  (PROVIGIL ) 200 MG tablet Take 1 tablet (200 mg total) by mouth daily. 30 tablet 0   OVER THE COUNTER MEDICATION 1.5 tablets in the morning and at bedtime. Calcium  Bone Strength Supplement1     triamcinolone  cream (KENALOG ) 0.1 % Apply 1 Application topically 2 (two) times daily. Apply to nail plates twice a day for nail lifting due to chemotherapy. 28 g 0   Vibegron  75 MG TABS Take 1 tablet (75 mg total) by mouth daily. 90 tablet 5   No current facility-administered medications for this visit.    Allergies[1]  Family History  Problem Relation Age of Onset   Lung cancer Mother 63   Hypertension Mother    Cancer Mother    Lupus Father    Mental illness Sister    Early death Sister 79       mental   Other Sister        hit by car as a child   Mental illness Brother    Leukemia Cousin 6   Breast cancer Neg  Hx     Social History   Socioeconomic History   Marital status: Married    Spouse name: Alm   Number of children: 1   Years of education: Not on file   Highest education level: 12th grade  Occupational History   Not on file  Tobacco Use   Smoking status: Former    Current packs/day: 0.00    Types: Cigarettes    Quit date: 09/09/1992    Years since quitting: 31.5   Smokeless tobacco: Current    Types: Chew   Tobacco comments:    Got pregnant  Vaping Use   Vaping status: Every Day  Substance and Sexual Activity   Alcohol use: Not Currently    Comment: rare   Drug use: Yes    Types: Marijuana    Comment: daily   Sexual activity: Yes    Birth control/protection: Post-menopausal  Other Topics Concern   Not on file  Social History Narrative   Not on file   Social Drivers of Health   Tobacco Use: High  Risk (03/15/2024)   Patient History    Smoking Tobacco Use: Former    Smokeless Tobacco Use: Current    Passive Exposure: Not on Actuary Strain: Low Risk (01/30/2024)   Overall Financial Resource Strain (CARDIA)    Difficulty of Paying Living Expenses: Not hard at all  Food Insecurity: No Food Insecurity (01/30/2024)   Epic    Worried About Programme Researcher, Broadcasting/film/video in the Last Year: Never true    Ran Out of Food in the Last Year: Never true  Transportation Needs: No Transportation Needs (01/30/2024)   Epic    Lack of Transportation (Medical): No    Lack of Transportation (Non-Medical): No  Physical Activity: Sufficiently Active (01/30/2024)   Exercise Vital Sign    Days of Exercise per Week: 7 days    Minutes of Exercise per Session: 80 min  Stress: Stress Concern Present (01/30/2024)   [employer] of Occupational Health - Occupational Stress Questionnaire    Feeling of Stress: Very much  Social Connections: Moderately Integrated (01/30/2024)   Social Connection and Isolation Panel    Frequency of Communication with Friends and Family: More than three times a week    Frequency of Social Gatherings with Friends and Family: Once a week    Attends Religious Services: More than 4 times per year    Active Member of [employer] or Organizations: No    Attends Banker Meetings: Not on file    Marital Status: Married  Catering Manager Violence: Not At Risk (05/27/2022)   Humiliation, Afraid, Rape, and Kick questionnaire    Fear of Current or Ex-Partner: No    Emotionally Abused: No    Physically Abused: No    Sexually Abused: No  Depression (PHQ2-9): Low Risk (01/11/2024)   Depression (PHQ2-9)    PHQ-2 Score: 0  Alcohol Screen: Low Risk (12/14/2023)   Alcohol Screen    Last Alcohol Screening Score (AUDIT): 1  Housing: Low Risk (01/30/2024)   Epic    Unable to Pay for Housing in the Last Year: No    Number of Times Moved in the Last Year: 0    Homeless in the Last  Year: No  Utilities: Not At Risk (05/27/2022)   AHC Utilities    Threatened with loss of utilities: No  Health Literacy: Not on file     Constitutional: Pt reports headache. Denies fever, malaise, fatigue, or abrupt weight  changes.  HEENT: Pt reports sinus pressure, ear fullness, runny nose and nasal congestion. Denies eye pain, eye redness, ear pain, ringing in the ears, wax buildup, bloody nose, or sore throat. Respiratory: Pt reports cough, shortness of breath. Denies difficulty breathing, or sputum production.   Cardiovascular: Denies chest pain, chest tightness, palpitations or swelling in the hands or feet.  Gastrointestinal: Pt reports her appetite, diarrhea. Denies abdominal pain, bloating, constipation, or blood in the stool.  GU: Denies urgency, frequency, pain with urination, burning sensation, blood in urine, odor or discharge. Musculoskeletal: Denies decrease in range of motion, difficulty with gait, muscle pain or joint pain and swelling.  Skin: Denies redness, rashes, lesions or ulcercations.  Neurological: Pt reports dizziness. Denies difficulty with memory, difficulty with speech or problems with balance and coordination.  Psych: Denies anxiety, depression, SI/HI.  No other specific complaints in a complete review of systems (except as listed in HPI above).      Objective:   Physical Exam  BP 120/70 (BP Location: Right Arm, Patient Position: Sitting, Cuff Size: Normal)   Pulse 77   Temp 98.3 F (36.8 C)   Ht 5' 4 (1.626 m)   Wt 110 lb 3.2 oz (50 kg)   SpO2 97%   BMI 18.92 kg/m   Wt Readings from Last 3 Encounters:  03/15/24 116 lb 14.4 oz (53 kg)  01/11/24 116 lb 14.4 oz (53 kg)  12/15/23 113 lb 12.8 oz (51.6 kg)    General: Appears her stated age, appears unwell but in NAD. Skin: Warm, dry and intact.  HEENT: Head: normal shape and size, no sinus pressure noted; Eyes: sclera white, no icterus, conjunctiva pink, PERRLA and EOMs intact; Ears: Cerumen  impaction on the left, ball of cotton noted on the right nix of the TM; Nose: mucosa pink and moist, septum midline; Throat/Mouth: Teeth present, mucosa thematous and moist, + PND, no exudate, lesions or ulcerations noted.  Neck: No adenopathy noted. Cardiovascular: Normal rate and rhythm. S1,S2 noted.  No murmur, rubs or gallops noted.  Pulmonary/Chest: Normal effort and positive vesicular breath sounds. No respiratory distress. No wheezes, rales or ronchi noted.  Musculoskeletal:  No difficulty with gait.  Neurological: Alert and oriented. Coordination normal.    BMET    Component Value Date/Time   NA 137 11/21/2023 1001   NA 138 09/17/2019 1136   K 3.8 11/21/2023 1001   CL 103 11/21/2023 1001   CO2 25 11/21/2023 1001   GLUCOSE 92 11/21/2023 1001   BUN 15 11/21/2023 1001   BUN 16 09/17/2019 1136   CREATININE 0.74 11/21/2023 1001   CREATININE 0.88 06/28/2021 0918   CALCIUM  9.1 11/21/2023 1001   GFRNONAA >60 11/21/2023 1001   GFRAA 81 09/17/2019 1136    Lipid Panel     Component Value Date/Time   CHOL 154 05/19/2023 0847   CHOL 165 09/17/2019 1136   TRIG 92 05/19/2023 0847   HDL 52 05/19/2023 0847   HDL 51 09/17/2019 1136   CHOLHDL 3.0 05/19/2023 0847   LDLCALC 83 05/19/2023 0847    CBC    Component Value Date/Time   WBC 7.9 11/21/2023 1001   WBC 7.5 07/25/2023 0916   RBC 4.88 11/21/2023 1001   HGB 14.1 11/21/2023 1001   HGB 14.9 09/17/2019 1136   HCT 43.5 11/21/2023 1001   HCT 43.8 09/17/2019 1136   PLT 223 11/21/2023 1001   PLT 250 09/17/2019 1136   MCV 89.1 11/21/2023 1001   MCV 87 09/17/2019 1136  MCH 28.9 11/21/2023 1001   MCHC 32.4 11/21/2023 1001   RDW 13.0 11/21/2023 1001   RDW 11.9 09/17/2019 1136   LYMPHSABS 1.2 11/21/2023 1001   LYMPHSABS 2.1 09/17/2019 1136   MONOABS 0.4 11/21/2023 1001   EOSABS 0.3 11/21/2023 1001   EOSABS 0.2 09/17/2019 1136   BASOSABS 0.1 11/21/2023 1001   BASOSABS 0.1 09/17/2019 1136    Hgb A1C Lab Results  Component  Value Date   HGBA1C 5.5 05/19/2023            Assessment & Plan:   Assessment and Plan    Urgent care follow-up for Influenza A with persistent URI symptoms, postviral cough syndrome Influenza A diagnosed with persistent cough, headache, nasal congestion, and dizziness. No bronchitis or pneumonia. Cough may persist 4-6 weeks. Decreased appetite causing weight loss and dehydration. -Encouraged rest and fluids - Prescribed prednisone  10 mg taper to boost energy and appetite. - Prescribed Tessalon 100 mg TID as needed for cough. - Encouraged increased food and fluid intake.  Impacted cerumen and foreign body in left ear Impacted cerumen and foreign body causing ear pressure and potential dizziness. Office removal not advised due to dizziness risk. - Advised hydrogen peroxide for foreign body removal. - Scheduled ear flushing next Monday at 8:40 AM. - Advised Flonase for potential inner ear fluid.    RTC in 3 months for your annual exam Angeline Laura, NP      [1]  Allergies Allergen Reactions   Poison Ivy Extract Rash   "

## 2024-03-29 ENCOUNTER — Ambulatory Visit: Admitting: Podiatry

## 2024-04-01 ENCOUNTER — Encounter: Payer: Self-pay | Admitting: Internal Medicine

## 2024-04-01 ENCOUNTER — Ambulatory Visit (INDEPENDENT_AMBULATORY_CARE_PROVIDER_SITE_OTHER): Admitting: Internal Medicine

## 2024-04-01 ENCOUNTER — Other Ambulatory Visit: Payer: Self-pay | Admitting: Nurse Practitioner

## 2024-04-01 ENCOUNTER — Other Ambulatory Visit: Payer: Self-pay | Admitting: Internal Medicine

## 2024-04-01 VITALS — BP 128/74 | HR 74 | Ht 64.0 in | Wt 114.2 lb

## 2024-04-01 DIAGNOSIS — H6122 Impacted cerumen, left ear: Secondary | ICD-10-CM | POA: Diagnosis not present

## 2024-04-01 DIAGNOSIS — T161XXA Foreign body in right ear, initial encounter: Secondary | ICD-10-CM

## 2024-04-01 NOTE — Patient Instructions (Signed)
 Earwax Buildup, Adult Your ears make something called earwax. It helps keep germs called bacteria away and protects the skin in your ears. Sometimes, too much earwax can build up. This can cause discomfort or make it harder to hear. What are the causes? Earwax buildup can happen when you have too much earwax in your ears. Earwax is made in the outer part of your ear canal. It's supposed to fall out in small amounts over time. But if your ears aren't able to clean themselves like they should, earwax can build up. What increases the risk? You're more likely to get earwax buildup if: You clean your ears with cotton swabs. You pick at your ears. You use earplugs or in-ear headphones a lot. You wear hearing aids. You may also be more likely to get it if: You're female. You're older. Your ears naturally make more earwax. You have narrow ear canals or extra hair in your ears. Your earwax is too thick or sticky. You have eczema. You're dehydrated. This means there's not enough fluid in your body. What are the signs or symptoms? Symptoms of earwax buildup include: Not being able to hear as well. A feeling of fullness in your ear. Feeling like your ear is plugged. Fluid coming from your ear. Ear pain or an itchy ear. Ringing in your ear. Coughing or problems with balance. How is this diagnosed? Earwax buildup may be diagnosed based on your symptoms, medical history, and an ear exam. During the exam, your health care provider will look into your ear with a tool called an otoscope. You may also have tests, such as a hearing test. How is this treated? Earwax buildup may be treated by: Using ear drops. Having the earwax removed by a provider. The provider may: Flush the ear with water. Use a tool called a curette that has a loop on the end. Use a suction device. Having surgery. This may be done in severe cases. Follow these instructions at home:  Cleaning your ears Clean your ears as told  by your provider. You can clean the outside of your ears with a washcloth or tissue. Do not overclean your ears. Do not put anything into your ear unless told. This includes cotton swabs. General instructions Take over-the-counter and prescription medicines only as told by your provider. Drink enough fluid to keep your pee (urine) pale yellow. This helps thin the earwax. If you have hearing aids, clean them as told. Keep all follow-up visits. If earwax builds up in your ears often or if you use hearing aids, ask your provider how often you should have your ears cleaned. Contact a health care provider if: Your ear pain gets worse. You have a fever. You have pus, blood, or other fluid coming from your ear. You have hearing loss. You have ringing in your ears that won't go away. You feel like the room is spinning. This is called vertigo. Your symptoms don't get better with treatment. This information is not intended to replace advice given to you by your health care provider. Make sure you discuss any questions you have with your health care provider. Document Revised: 05/26/2022 Document Reviewed: 05/26/2022 Elsevier Patient Education  2024 [employer].

## 2024-04-01 NOTE — Progress Notes (Signed)
 "  Subjective:    Patient ID: Jennifer Garrison, female    DOB: 07-Feb-1961, 64 y.o.   MRN: [id number]  HPI  Patient presents to clinic today with complaint of cerumen impaction of the left ear and foreign body in the right ear.  This was evaluated at her last visit on 12/29 however she was complaining of dizziness secondary to influenza A so it was not recommended to do an ear flush at that time.  She was advised to do Debrox OTC which she admits that she did not do because she did not want to make the issue worse.  Review of Systems   Past Medical History:  Diagnosis Date   Allergy 1970   Anxiety 2000   Arthritis 2021   Breast cancer (HCC) 05/27/2022   Cancer (HCC)    Breast stage A1   Depression 2000   IBS (irritable bowel syndrome)    Trigger finger    Left Middle Finger 11/2016    Current Outpatient Medications  Medication Sig Dispense Refill   ADVANCED FIBER COMPLEX PO Take 3 tablets by mouth daily. Gummies     alendronate (FOSAMAX) 70 MG tablet Take 70 mg by mouth once a week.     benzonatate  (TESSALON ) 100 MG capsule Take 1 capsule (100 mg total) by mouth 2 (two) times daily as needed for cough. 20 capsule 0   buPROPion  (WELLBUTRIN  XL) 150 MG 24 hr tablet TAKE 1 TABLET DAILY 90 tablet 0   busPIRone  (BUSPAR ) 5 MG tablet TAKE 1 TABLET TWICE A DAY AS NEEDED 180 tablet 1   Calcium  Carb-Cholecalciferol (CALCIUM  600 + D) 600-5 MG-MCG TABS 1 orally twice a day     estradiol  (ESTRACE  VAGINAL) 0.1 MG/GM vaginal cream Apply 1/3 an applicator full high into the vagina at night to 3 times a week. 42.5 g 2   ibuprofen  (ADVIL ) 600 MG tablet Take 1 tablet (600 mg total) by mouth every 6 (six) hours as needed. 30 tablet 0   metroNIDAZOLE  (METROCREAM ) 0.75 % cream Apply topically as needed.     modafinil  (PROVIGIL ) 200 MG tablet Take 1 tablet (200 mg total) by mouth daily. 30 tablet 0   ondansetron  (ZOFRAN -ODT) 4 MG disintegrating tablet Take 4 mg by mouth 3 (three) times daily as needed.      OVER THE COUNTER MEDICATION 1.5 tablets in the morning and at bedtime. Calcium  Bone Strength Supplement1     predniSONE  (DELTASONE ) 10 MG tablet Take 6 tabs on day 1, 5 tabs on day 2, 4 tabs on day 3, 3 tabs on day 4, 2 tabs on day 5, 1 tab on day 6 21 tablet 0   promethazine-dextromethorphan (PROMETHAZINE-DM) 6.25-15 MG/5ML syrup Take 5 mLs by mouth every 4 (four) hours as needed.     triamcinolone  cream (KENALOG ) 0.1 % Apply 1 Application topically 2 (two) times daily. Apply to nail plates twice a day for nail lifting due to chemotherapy. 28 g 0   Vibegron  75 MG TABS Take 1 tablet (75 mg total) by mouth daily. 90 tablet 5   No current facility-administered medications for this visit.    Allergies[1]  Family History  Problem Relation Age of Onset   Lung cancer Mother 35   Hypertension Mother    Cancer Mother    Lupus Father    Mental illness Sister    Early death Sister 12       mental   Other Sister        hit by  car as a child   Mental illness Brother    Leukemia Cousin 6   Breast cancer Neg Hx     Social History   Socioeconomic History   Marital status: Married    Spouse name: Alm   Number of children: 1   Years of education: Not on file   Highest education level: 12th grade  Occupational History   Not on file  Tobacco Use   Smoking status: Former    Current packs/day: 0.00    Types: Cigarettes    Quit date: 09/09/1992    Years since quitting: 31.5   Smokeless tobacco: Current    Types: Chew   Tobacco comments:    Got pregnant  Vaping Use   Vaping status: Every Day  Substance and Sexual Activity   Alcohol use: Not Currently    Comment: rare   Drug use: Yes    Types: Marijuana    Comment: daily   Sexual activity: Yes    Birth control/protection: Post-menopausal  Other Topics Concern   Not on file  Social History Narrative   Not on file   Social Drivers of Health   Tobacco Use: High Risk (03/25/2024)   Patient History    Smoking Tobacco Use: Former     Smokeless Tobacco Use: Current    Passive Exposure: Not on Actuary Strain: Low Risk (01/30/2024)   Overall Financial Resource Strain (CARDIA)    Difficulty of Paying Living Expenses: Not hard at all  Food Insecurity: No Food Insecurity (01/30/2024)   Epic    Worried About Programme Researcher, Broadcasting/film/video in the Last Year: Never true    Ran Out of Food in the Last Year: Never true  Transportation Needs: No Transportation Needs (01/30/2024)   Epic    Lack of Transportation (Medical): No    Lack of Transportation (Non-Medical): No  Physical Activity: Sufficiently Active (01/30/2024)   Exercise Vital Sign    Days of Exercise per Week: 7 days    Minutes of Exercise per Session: 80 min  Stress: Stress Concern Present (01/30/2024)   [employer] of Occupational Health - Occupational Stress Questionnaire    Feeling of Stress: Very much  Social Connections: Moderately Integrated (01/30/2024)   Social Connection and Isolation Panel    Frequency of Communication with Friends and Family: More than three times a week    Frequency of Social Gatherings with Friends and Family: Once a week    Attends Religious Services: More than 4 times per year    Active Member of [employer] or Organizations: No    Attends Banker Meetings: Not on file    Marital Status: Married  Catering Manager Violence: Not At Risk (05/27/2022)   Humiliation, Afraid, Rape, and Kick questionnaire    Fear of Current or Ex-Partner: No    Emotionally Abused: No    Physically Abused: No    Sexually Abused: No  Depression (PHQ2-9): Low Risk (03/25/2024)   Depression (PHQ2-9)    PHQ-2 Score: 0  Alcohol Screen: Low Risk (12/14/2023)   Alcohol Screen    Last Alcohol Screening Score (AUDIT): 1  Housing: Low Risk (01/30/2024)   Epic    Unable to Pay for Housing in the Last Year: No    Number of Times Moved in the Last Year: 0    Homeless in the Last Year: No  Utilities: Not At Risk (05/27/2022)   AHC Utilities     Threatened with loss of utilities: No  Health Literacy: Not on file     Constitutional:  Denies fever, malaise, fatigue, headache, or abrupt weight changes.  HEENT: Pt reports ear fullness and hoarseness. Denies eye pain, eye redness, ear pain, ringing in the ears, wax buildup, runny nose, nasal congestion bloody nose, or sore throat. Respiratory: Denies difficulty breathing, shortness of breath, cough or sputum production.   Cardiovascular: Denies chest pain, chest tightness, palpitations or swelling in the hands or feet.  Gastrointestinal: Denies abdominal pain, bloating, constipation, diarrhea or blood in the stool.  GU: Denies urgency, frequency, pain with urination, burning sensation, blood in urine, odor or discharge. Musculoskeletal: Denies decrease in range of motion, difficulty with gait, muscle pain or joint pain and swelling.  Skin: Denies redness, rashes, lesions or ulcercations.  Neurological: Denies dizziness, difficulty with memory, difficulty with speech or problems with balance and coordination.  Psych: Denies anxiety, depression, SI/HI.  No other specific complaints in a complete review of systems (except as listed in HPI above).      Objective:   Physical Exam  BP 128/74 (BP Location: Right Arm, Patient Position: Sitting, Cuff Size: Normal)   Pulse 74   Ht 5' 4 (1.626 m)   Wt 114 lb 3.2 oz (51.8 kg)   SpO2 98%   BMI 19.60 kg/m    Wt Readings from Last 3 Encounters:  03/25/24 110 lb 3.2 oz (50 kg)  03/15/24 116 lb 14.4 oz (53 kg)  01/11/24 116 lb 14.4 oz (53 kg)    General: Appears her stated age, well-developed, well-nourished, in NAD. Skin: Warm, dry and intact.  HEENT: Head: normal shape and size, no sinus pressure noted; Eyes: sclera white, no icterus, conjunctiva pink, PERRLA and EOMs intact; Ears: Cerumen impaction on the left, ball of cotton noted on the right next the TM;  Cardiovascular: Normal rate. Pulmonary/Chest: Normal effort and positive  vesicular breath sounds. No respiratory distress. No wheezes, rales or ronchi noted.  Musculoskeletal:  No difficulty with gait.  Neurological: Alert and oriented. Coordination normal.    BMET    Component Value Date/Time   NA 137 11/21/2023 1001   NA 138 09/17/2019 1136   K 3.8 11/21/2023 1001   CL 103 11/21/2023 1001   CO2 25 11/21/2023 1001   GLUCOSE 92 11/21/2023 1001   BUN 15 11/21/2023 1001   BUN 16 09/17/2019 1136   CREATININE 0.74 11/21/2023 1001   CREATININE 0.88 06/28/2021 0918   CALCIUM  9.1 11/21/2023 1001   GFRNONAA >60 11/21/2023 1001   GFRAA 81 09/17/2019 1136    Lipid Panel     Component Value Date/Time   CHOL 154 05/19/2023 0847   CHOL 165 09/17/2019 1136   TRIG 92 05/19/2023 0847   HDL 52 05/19/2023 0847   HDL 51 09/17/2019 1136   CHOLHDL 3.0 05/19/2023 0847   LDLCALC 83 05/19/2023 0847    CBC    Component Value Date/Time   WBC 7.9 11/21/2023 1001   WBC 7.5 07/25/2023 0916   RBC 4.88 11/21/2023 1001   HGB 14.1 11/21/2023 1001   HGB 14.9 09/17/2019 1136   HCT 43.5 11/21/2023 1001   HCT 43.8 09/17/2019 1136   PLT 223 11/21/2023 1001   PLT 250 09/17/2019 1136   MCV 89.1 11/21/2023 1001   MCV 87 09/17/2019 1136   MCH 28.9 11/21/2023 1001   MCHC 32.4 11/21/2023 1001   RDW 13.0 11/21/2023 1001   RDW 11.9 09/17/2019 1136   LYMPHSABS 1.2 11/21/2023 1001   LYMPHSABS 2.1 09/17/2019 1136  MONOABS 0.4 11/21/2023 1001   EOSABS 0.3 11/21/2023 1001   EOSABS 0.2 09/17/2019 1136   BASOSABS 0.1 11/21/2023 1001   BASOSABS 0.1 09/17/2019 1136    Hgb A1C Lab Results  Component Value Date   HGBA1C 5.5 05/19/2023            Assessment & Plan:   Impacted cerumen in left ear and foreign body in right ear - Manual lavage by CMA, successful of getting the wax out of the left ear but unsuccessful with getting the cotton tip out of the right ear - Referral to ENT for further evaluation and treatment - Advised her to avoid using cotton swabs in her  ears - Recommend Debrox 2 times weekly to prevent further wax buildup   RTC in 2 months for your annual exam Angeline Laura, NP       [1]  Allergies Allergen Reactions   Poison Ivy Extract Rash   "

## 2024-04-02 ENCOUNTER — Encounter: Payer: Self-pay | Admitting: Internal Medicine

## 2024-04-02 MED ORDER — BENZONATATE 100 MG PO CAPS: 100.0000 mg | ORAL_CAPSULE | Freq: Two times a day (BID) | ORAL | 0 refills | Status: AC | PRN

## 2024-04-03 NOTE — Telephone Encounter (Signed)
 Duplicate request, refilled 04/02/24.  Requested Prescriptions  Pending Prescriptions Disp Refills   benzonatate  (TESSALON ) 100 MG capsule [Pharmacy Med Name: Benzonatate  100 MG Oral Capsule] 20 capsule 0    Sig: TAKE 1 CAPSULE BY MOUTH TWICE DAILY AS NEEDED FOR COUGH     Ear, Nose, and Throat:  Antitussives/Expectorants Passed - 04/03/2024 10:35 AM      Passed - Valid encounter within last 12 months    Recent Outpatient Visits           2 days ago Impacted cerumen of left ear   [place] [facility] [place], Angeline ORN, NP   1 week ago Influenza A   [place] [facility] [place], Angeline ORN, NP   1 month ago Chronic fatigue   [place] [facility] [place], Angeline ORN, NP   3 months ago Trigger middle finger of left hand   [place] [facility] [place], Angeline ORN, NP   10 months ago Encounter for general adult medical examination with abnormal findings   [place] [facility] [place], Angeline ORN, NP       Future Appointments             In 2 weeks Guadlupe Lianne DASEN, MD [facility] Urogynecology at [facility], [facility]

## 2024-04-16 ENCOUNTER — Other Ambulatory Visit: Payer: Self-pay | Admitting: Internal Medicine

## 2024-04-16 NOTE — Telephone Encounter (Signed)
 Requested medications are due for refill today.  unsure  Requested medications are on the active medications list.  yes  Last refill. 03/13/2024 #30 0 rf  Future visit scheduled.   yes  Notes to clinic.  Medication not assigned to a protocol. Please review for refill. Per med list pt no longer taking.    Requested Prescriptions  Pending Prescriptions Disp Refills   modafinil  (PROVIGIL ) 200 MG tablet [Pharmacy Med Name: Modafinil  200 MG Oral Tablet] 30 tablet 0    Sig: Take 1 tablet by mouth once daily     Off-Protocol Failed - 04/16/2024  4:18 PM      Failed - Medication not assigned to a protocol, review manually.      Passed - Valid encounter within last 12 months    Recent Outpatient Visits           2 weeks ago Impacted cerumen of left ear   [place] [facility] [place], Angeline ORN, NP   3 weeks ago Influenza A   [place] [facility] [place], Angeline ORN, NP   2 months ago Chronic fatigue   [place] [facility] [place], Angeline ORN, NP   4 months ago Trigger middle finger of left hand   [place] [facility] [place], Angeline ORN, NP   11 months ago Encounter for general adult medical examination with abnormal findings   [place] [facility], Angeline ORN, NP       Future Appointments             In 2 days Guadlupe Lianne DASEN, MD [facility] at [facility], [facility]

## 2024-04-18 ENCOUNTER — Ambulatory Visit: Admitting: Obstetrics

## 2024-04-23 NOTE — Progress Notes (Unsigned)
 Jennifer Garrison Return Visit  SUBJECTIVE  History of Present Illness: Jennifer Garrison is a 64 y.o. female seen in follow-up for coital urinary incontinence, history of pelvic surgery, pelvic floor myofascial pain, and constipation. Plan at last visit was referral to pelvic floor PT, continue vaginal estrogen, and trial of gemtesa .   Leakage with *** x/week Positive CST 11/30/23 ***UDS Gemtesa  use*** Day time voids 8-20  Bowel movements: 2-4 time(s) per week ***fiber  Past Medical History: Patient  has a past medical history of Allergy (1970), Anxiety (2000), Arthritis (2021), Breast cancer (HCC) (05/27/2022), Cancer (HCC), Depression (2000), IBS (irritable bowel syndrome), and Trigger finger.   Past Surgical History: She  has a past surgical history that includes Cholecystectomy (1991); Abdominal hysterectomy (02/2016); Breast enhancement surgery (1998); Tympanoplasty (Left); Breast cyst aspiration (Left, 2010); Augmentation mammaplasty (Bilateral, 2010); Colonoscopy with propofol  (N/A, 10/10/2019); Breast biopsy (Left, 05/17/2022); Breast biopsy (Left, 05/17/2022); Partial mastectomy with axillary sentinel lymph node biopsy (Left, 06/09/2022); Portacath placement (N/A, 06/30/2022); and Abdominal hysterectomy (2017).   Medications: She has a current medication list which includes the following prescription(s): fiber, alendronate, benzonatate , bupropion , buspirone , calcium  carb-cholecalciferol, estradiol , ibuprofen , meloxicam, metronidazole , modafinil , ondansetron , OVER THE COUNTER MEDICATION, oxybutynin , triamcinolone  cream, and vibegron .   Allergies: Patient is allergic to poison ivy extract.   Social History: Patient  reports that she quit smoking about 31 years ago. Her smoking use included cigarettes. Her smokeless tobacco use includes chew. She reports that she does not currently use alcohol. She reports current drug use. Drug: Marijuana.     OBJECTIVE     Physical  Exam: There were no vitals filed for this visit. Gen: No apparent distress, A&O x 3.  Detailed Urogynecologic Evaluation:  Deferred. Prior exam showed:      No data to display             ASSESSMENT AND PLAN    Jennifer Garrison is a 64 y.o. with:  No diagnosis found.  There are no diagnoses linked to this encounter.   Jennifer ONEIDA Gillis, MD

## 2024-04-24 ENCOUNTER — Encounter: Payer: Self-pay | Admitting: Obstetrics

## 2024-04-24 ENCOUNTER — Ambulatory Visit: Admitting: Obstetrics

## 2024-04-24 VITALS — BP 112/70 | HR 72

## 2024-04-24 DIAGNOSIS — N39491 Coital incontinence: Secondary | ICD-10-CM

## 2024-04-24 DIAGNOSIS — N952 Postmenopausal atrophic vaginitis: Secondary | ICD-10-CM | POA: Insufficient documentation

## 2024-04-24 DIAGNOSIS — N3281 Overactive bladder: Secondary | ICD-10-CM | POA: Insufficient documentation

## 2024-04-24 NOTE — Patient Instructions (Addendum)
 Continue Gemtesa  1 tab daily.   We discussed an office procedure with urethral bulking (Bulkamid). We discussed success rate of approximately 70-80% and possible need for second injection. We reviewed that this is not a permanent procedure and the Bulkamid does become less effective over time. Risks reviewed including injury to bladder or urethra, UTI, urinary retention and hematuria.

## 2024-04-24 NOTE — Assessment & Plan Note (Signed)
-   stopped oxybutynin , continue gemtesa  due to relief - We discussed the symptoms of overactive bladder (OAB), which include urinary urgency, urinary frequency, nocturia, with or without urge incontinence.  While we do not know the exact etiology of OAB, several treatment options exist. We discussed management including behavioral therapy (decreasing bladder irritants, urge suppression strategies, timed voids, bladder retraining), physical therapy, medication; for refractory cases posterior tibial nerve stimulation, sacral neuromodulation, and intravesical botulinum toxin injection.  For anticholinergic medications, we discussed the potential side effects of anticholinergics including dry eyes, dry mouth, constipation, cognitive impairment and urinary retention. For Beta-3 agonist medication, we discussed the potential side effect of elevated blood pressure which is more likely to occur in individuals with uncontrolled hypertension. - encouraged to reduce caffeine intake

## 2024-04-24 NOTE — Assessment & Plan Note (Signed)
-   now most bothersome during movement with intercourse - For treatment of stress urinary incontinence,  non-surgical options include expectant management, weight loss, physical therapy, as well as a pessary.  Surgical options include a midurethral sling, Burch urethropexy, and transurethral injection of a bulking agent. - discussed risk of pessary includes: change in urinary or bowel symptoms, vaginal ulceration, discharge, bleeding, fistula formation. Explained that pt may require multiple sizes and types for fitting.  - reviewed office procedure with urethral bulking (Bulkamid). We discussed success rate of approximately 70-80% and possible need for second injection. We reviewed that this is not a permanent procedure and the Bulkamid does become less effective over time. Risks reviewed including injury to bladder or urethra, UTI, urinary retention and hematuria.  - Sling: The effectiveness of a midurethral vaginal mesh sling is approximately 85%, and thus, there will be times when you may leak urine after surgery, especially if your bladder is full or if you have a strong cough. There is a balance between making the sling tight enough to treat your leakage but not too tight so that you have long-term difficulty emptying your bladder. A mesh sling will not directly treat overactive bladder/urge incontinence and may worsen it.  There is an FDA safety notification on vaginal mesh procedures for prolapse but NOT mesh slings. We have extensive experience and training with mesh placement and we have close postoperative follow up to identify any potential complications from mesh. It is important to realize that this mesh is a permanent implant that cannot be easily removed. There are rare risks of mesh exposure (2-4%), pain with intercourse (0-7%), and infection (<1%). The risk of mesh exposure if more likely in a woman with risks for poor healing (prior radiation, poorly controlled diabetes, or immunocompromised).  The risk of new or worsened chronic pain after mesh implant is more common in women with baseline chronic pain and/or poorly controlled anxiety or depression. Approximately 2-4% of patients will experience longer-term post-operative voiding dysfunction that may require surgical revision of the sling. We also reviewed that postoperatively, her stream may not be as strong as before surgery.  - pt consider urethral bulking, office to start PA and schedule with insurance

## 2024-04-24 NOTE — Assessment & Plan Note (Signed)
-   For symptomatic vaginal atrophy options include lubrication with a water-based lubricant, personal hygiene measures and barrier protection against wetness, and estrogen replacement in the form of vaginal cream, vaginal tablets, or a time-released vaginal ring.   - continue low dose vaginal estrogen 1g 3x/week

## 2024-04-25 ENCOUNTER — Other Ambulatory Visit: Payer: Self-pay | Admitting: Internal Medicine

## 2024-04-25 NOTE — Telephone Encounter (Signed)
 Requested Prescriptions  Pending Prescriptions Disp Refills   oxybutynin  (DITROPAN -XL) 5 MG 24 hr tablet [Pharmacy Med Name: OXYBUTYNIN  CHLORIDE ER TABS 5MG ] 90 tablet 3    Sig: TAKE 1 TABLET AT BEDTIME     Urology:  Bladder Agents Passed - 04/25/2024  3:53 PM      Passed - Valid encounter within last 12 months    Recent Outpatient Visits           3 weeks ago Impacted cerumen of left ear   [place] [facility] [place], Angeline ORN, NP   1 month ago Influenza A   [place] [facility] [place], Angeline ORN, NP   2 months ago Chronic fatigue   [place] [facility] [place], [place], NP   4 months ago Trigger middle finger of left hand   [place] [facility] [place], Angeline ORN, NP   11 months ago Encounter for general adult medical examination with abnormal findings   [place] [facility] [place], Angeline ORN, NP              Refused Prescriptions Disp Refills   buPROPion  (WELLBUTRIN  XL) 150 MG 24 hr tablet [Pharmacy Med Name: BUPROPION  HCL XL TABS 150MG ] 90 tablet 3    Sig: TAKE 1 TABLET DAILY     Psychiatry: Antidepressants - bupropion  Passed - 04/25/2024  3:53 PM      Passed - Cr in normal range and within 360 days    Creatinine  Date Value Ref Range Status  11/21/2023 0.74 0.44 - 1.00 mg/dL Final   Creat  Date Value Ref Range Status  06/28/2021 0.88 0.50 - 1.05 mg/dL Final         Passed - AST in normal range and within 360 days    AST  Date Value Ref Range Status  11/21/2023 23 15 - 41 U/L Final         Passed - ALT in normal range and within 360 days    ALT  Date Value Ref Range Status  11/21/2023 20 0 - 44 U/L Final         Passed - Completed PHQ-2 or PHQ-9 in the last 360 days      Passed - Last BP in normal range    BP Readings from Last 1 Encounters:  04/24/24 112/70         Passed - Valid encounter within last 6 months    Recent Outpatient Visits           3 weeks ago  Impacted cerumen of left ear   [place] [facility] [place], Angeline ORN, NP   1 month ago Influenza A   [place] [facility] [place], Angeline ORN, NP   2 months ago Chronic fatigue   [place] [facility] [place], Angeline ORN, NP   4 months ago Trigger middle finger of left hand   [place] [facility] [place], Angeline ORN, NP   11 months ago Encounter for general adult medical examination with abnormal findings    [facility], Angeline ORN, NP

## 2024-04-25 NOTE — Telephone Encounter (Signed)
 Requested medications are due for refill today.  unsure  Requested medications are on the active medications list.  yes  Last refill. 01/24/2024  Future visit scheduled.   yes  Notes to clinic.  Medication is historical    Requested Prescriptions  Pending Prescriptions Disp Refills   oxybutynin  (DITROPAN -XL) 5 MG 24 hr tablet [Pharmacy Med Name: OXYBUTYNIN  CHLORIDE ER TABS 5MG ] 90 tablet 3    Sig: TAKE 1 TABLET AT BEDTIME     Urology:  Bladder Agents Passed - 04/25/2024  3:53 PM      Passed - Valid encounter within last 12 months    Recent Outpatient Visits           3 weeks ago Impacted cerumen of left ear   [place] [facility] [place], Angeline ORN, NP   1 month ago Influenza A   [place] [facility] [place], Angeline ORN, NP   2 months ago Chronic fatigue   [place] [facility] [place], [place], NP   4 months ago Trigger middle finger of left hand   [place] [facility] [place], Angeline ORN, NP   11 months ago Encounter for general adult medical examination with abnormal findings   [place] [facility] [place], Angeline ORN, NP              Refused Prescriptions Disp Refills   buPROPion  (WELLBUTRIN  XL) 150 MG 24 hr tablet [Pharmacy Med Name: BUPROPION  HCL XL TABS 150MG ] 90 tablet 3    Sig: TAKE 1 TABLET DAILY     Psychiatry: Antidepressants - bupropion  Passed - 04/25/2024  3:53 PM      Passed - Cr in normal range and within 360 days    Creatinine  Date Value Ref Range Status  11/21/2023 0.74 0.44 - 1.00 mg/dL Final   Creat  Date Value Ref Range Status  06/28/2021 0.88 0.50 - 1.05 mg/dL Final         Passed - AST in normal range and within 360 days    AST  Date Value Ref Range Status  11/21/2023 23 15 - 41 U/L Final         Passed - ALT in normal range and within 360 days    ALT  Date Value Ref Range Status  11/21/2023 20 0 - 44 U/L Final         Passed - Completed PHQ-2 or  PHQ-9 in the last 360 days      Passed - Last BP in normal range    BP Readings from Last 1 Encounters:  04/24/24 112/70         Passed - Valid encounter within last 6 months    Recent Outpatient Visits           3 weeks ago Impacted cerumen of left ear   [place] [facility] [place], Angeline ORN, NP   1 month ago Influenza A   [place] [facility] [place], Angeline ORN, NP   2 months ago Chronic fatigue   [place] [facility] [place], Angeline ORN, NP   4 months ago Trigger middle finger of left hand   [place] [facility] [place], Angeline ORN, NP   11 months ago Encounter for general adult medical examination with abnormal findings   [place] [facility] [place], Angeline ORN, NP

## 2024-05-21 ENCOUNTER — Encounter: Admitting: Internal Medicine

## 2024-05-28 ENCOUNTER — Ambulatory Visit: Admitting: Oncology

## 2025-01-09 ENCOUNTER — Ambulatory Visit: Admitting: Radiation Oncology
